# Patient Record
Sex: Female | Born: 1935 | Race: White | Hispanic: No | Smoking: Never smoker
Health system: Southern US, Community
[De-identification: ages and names within clinical notes are randomized; demographics above are authoritative.]

## PROBLEM LIST (undated history)

## (undated) DIAGNOSIS — E119 Type 2 diabetes mellitus without complications: Secondary | ICD-10-CM

## (undated) DIAGNOSIS — I2699 Other pulmonary embolism without acute cor pulmonale: Secondary | ICD-10-CM

## (undated) DIAGNOSIS — I96 Gangrene, not elsewhere classified: Secondary | ICD-10-CM

## (undated) DIAGNOSIS — I2510 Atherosclerotic heart disease of native coronary artery without angina pectoris: Secondary | ICD-10-CM

## (undated) DIAGNOSIS — R0902 Hypoxemia: Secondary | ICD-10-CM

## (undated) DIAGNOSIS — K219 Gastro-esophageal reflux disease without esophagitis: Secondary | ICD-10-CM

## (undated) DIAGNOSIS — F0390 Unspecified dementia without behavioral disturbance: Secondary | ICD-10-CM

## (undated) DIAGNOSIS — I219 Acute myocardial infarction, unspecified: Secondary | ICD-10-CM

## (undated) DIAGNOSIS — C679 Malignant neoplasm of bladder, unspecified: Secondary | ICD-10-CM

## (undated) DIAGNOSIS — L8990 Pressure ulcer of unspecified site, unspecified stage: Secondary | ICD-10-CM

## (undated) DIAGNOSIS — D649 Anemia, unspecified: Secondary | ICD-10-CM

## (undated) DIAGNOSIS — M1990 Unspecified osteoarthritis, unspecified site: Secondary | ICD-10-CM

## (undated) DIAGNOSIS — I739 Peripheral vascular disease, unspecified: Secondary | ICD-10-CM

## (undated) DIAGNOSIS — C55 Malignant neoplasm of uterus, part unspecified: Secondary | ICD-10-CM

## (undated) DIAGNOSIS — D801 Nonfamilial hypogammaglobulinemia: Secondary | ICD-10-CM

## (undated) DIAGNOSIS — L89301 Pressure ulcer of unspecified buttock, stage 1: Secondary | ICD-10-CM

## (undated) DIAGNOSIS — C539 Malignant neoplasm of cervix uteri, unspecified: Secondary | ICD-10-CM

## (undated) DIAGNOSIS — R32 Unspecified urinary incontinence: Secondary | ICD-10-CM

## (undated) DIAGNOSIS — I10 Essential (primary) hypertension: Secondary | ICD-10-CM

## (undated) DIAGNOSIS — IMO0001 Reserved for inherently not codable concepts without codable children: Secondary | ICD-10-CM

## (undated) DIAGNOSIS — C801 Malignant (primary) neoplasm, unspecified: Secondary | ICD-10-CM

## (undated) DIAGNOSIS — E7800 Pure hypercholesterolemia, unspecified: Secondary | ICD-10-CM

## (undated) DIAGNOSIS — A0472 Enterocolitis due to Clostridium difficile, not specified as recurrent: Secondary | ICD-10-CM

## (undated) DIAGNOSIS — N189 Chronic kidney disease, unspecified: Secondary | ICD-10-CM

## (undated) DIAGNOSIS — I509 Heart failure, unspecified: Secondary | ICD-10-CM

## (undated) HISTORY — DX: Nonfamilial hypogammaglobulinemia: D80.1

## (undated) HISTORY — PX: ANGIOPLASTY: SHX39

## (undated) HISTORY — PX: FEMORAL ENDARTERECTOMY: SUR606

## (undated) HISTORY — DX: Malignant (primary) neoplasm, unspecified: C80.1

## (undated) HISTORY — DX: Essential (primary) hypertension: I10

## (undated) HISTORY — DX: Gastro-esophageal reflux disease without esophagitis: K21.9

## (undated) HISTORY — PX: BLADDER SURGERY: SHX569

## (undated) HISTORY — PX: PORTACATH PLACEMENT: SHX2246

## (undated) HISTORY — PX: CATARACT EXTRACTION, BILATERAL: SHX1313

## (undated) HISTORY — DX: Unspecified osteoarthritis, unspecified site: M19.90

## (undated) HISTORY — DX: Pressure ulcer of unspecified site, unspecified stage: L89.90

## (undated) HISTORY — DX: Malignant neoplasm of cervix uteri, unspecified: C53.9

## (undated) HISTORY — DX: Malignant neoplasm of bladder, unspecified: C67.9

## (undated) HISTORY — DX: Malignant neoplasm of uterus, part unspecified: C55

## (undated) HISTORY — DX: Pressure ulcer of unspecified buttock, stage 1: L89.301

## (undated) HISTORY — DX: Enterocolitis due to Clostridium difficile, not specified as recurrent: A04.72

## (undated) HISTORY — DX: Other pulmonary embolism without acute cor pulmonale: I26.99

## (undated) HISTORY — PX: LEG SURGERY: SHX1003

## (undated) HISTORY — DX: Unspecified urinary incontinence: R32

## (undated) HISTORY — DX: Pure hypercholesterolemia, unspecified: E78.00

---

## 2004-04-02 ENCOUNTER — Other Ambulatory Visit: Payer: Self-pay

## 2004-04-09 ENCOUNTER — Ambulatory Visit: Payer: Self-pay | Admitting: Specialist

## 2004-05-20 ENCOUNTER — Ambulatory Visit: Payer: Self-pay | Admitting: Specialist

## 2004-06-10 ENCOUNTER — Ambulatory Visit: Payer: Self-pay | Admitting: Family Medicine

## 2004-09-11 ENCOUNTER — Ambulatory Visit: Payer: Self-pay | Admitting: Family Medicine

## 2005-03-28 ENCOUNTER — Emergency Department: Payer: Self-pay | Admitting: Emergency Medicine

## 2005-03-28 ENCOUNTER — Other Ambulatory Visit: Payer: Self-pay

## 2005-10-15 ENCOUNTER — Ambulatory Visit: Payer: Self-pay | Admitting: Family Medicine

## 2006-07-31 ENCOUNTER — Other Ambulatory Visit: Payer: Self-pay

## 2006-07-31 ENCOUNTER — Emergency Department: Payer: Self-pay | Admitting: Emergency Medicine

## 2006-09-01 ENCOUNTER — Observation Stay: Payer: Self-pay | Admitting: General Surgery

## 2006-11-30 ENCOUNTER — Emergency Department: Payer: Self-pay | Admitting: Unknown Physician Specialty

## 2006-12-06 ENCOUNTER — Ambulatory Visit: Payer: Self-pay | Admitting: Family Medicine

## 2006-12-23 ENCOUNTER — Ambulatory Visit: Payer: Self-pay | Admitting: Family Medicine

## 2007-01-10 ENCOUNTER — Ambulatory Visit: Payer: Self-pay | Admitting: Family Medicine

## 2007-02-12 ENCOUNTER — Other Ambulatory Visit: Payer: Self-pay

## 2007-02-13 ENCOUNTER — Inpatient Hospital Stay: Payer: Self-pay | Admitting: Internal Medicine

## 2007-07-18 ENCOUNTER — Ambulatory Visit: Payer: Self-pay | Admitting: Gynecologic Oncology

## 2007-07-20 ENCOUNTER — Ambulatory Visit: Payer: Self-pay | Admitting: Family Medicine

## 2007-08-16 ENCOUNTER — Ambulatory Visit: Payer: Self-pay | Admitting: Gynecologic Oncology

## 2007-09-04 ENCOUNTER — Emergency Department: Payer: Self-pay | Admitting: Emergency Medicine

## 2007-11-23 ENCOUNTER — Emergency Department: Payer: Self-pay | Admitting: Unknown Physician Specialty

## 2007-12-28 ENCOUNTER — Ambulatory Visit: Payer: Self-pay | Admitting: Family Medicine

## 2008-02-16 ENCOUNTER — Ambulatory Visit: Payer: Self-pay | Admitting: Gynecologic Oncology

## 2008-02-25 ENCOUNTER — Inpatient Hospital Stay: Payer: Self-pay | Admitting: Internal Medicine

## 2008-02-25 ENCOUNTER — Other Ambulatory Visit: Payer: Self-pay

## 2008-03-18 ENCOUNTER — Emergency Department: Payer: Self-pay | Admitting: Emergency Medicine

## 2008-05-07 ENCOUNTER — Ambulatory Visit: Payer: Self-pay | Admitting: Gastroenterology

## 2008-06-19 ENCOUNTER — Ambulatory Visit: Payer: Self-pay | Admitting: Gynecologic Oncology

## 2008-12-06 ENCOUNTER — Ambulatory Visit: Payer: Self-pay | Admitting: Gynecologic Oncology

## 2008-12-17 ENCOUNTER — Ambulatory Visit: Payer: Self-pay | Admitting: Gynecologic Oncology

## 2008-12-24 ENCOUNTER — Ambulatory Visit: Payer: Self-pay | Admitting: Gynecologic Oncology

## 2009-01-06 ENCOUNTER — Ambulatory Visit: Payer: Self-pay | Admitting: Gynecologic Oncology

## 2009-02-05 ENCOUNTER — Ambulatory Visit: Payer: Self-pay | Admitting: Family Medicine

## 2009-03-20 ENCOUNTER — Ambulatory Visit: Payer: Self-pay | Admitting: Family Medicine

## 2009-03-25 ENCOUNTER — Ambulatory Visit: Payer: Self-pay | Admitting: Family Medicine

## 2009-04-10 ENCOUNTER — Ambulatory Visit: Payer: Self-pay | Admitting: Family Medicine

## 2009-10-07 ENCOUNTER — Ambulatory Visit: Payer: Self-pay | Admitting: Family Medicine

## 2009-12-18 ENCOUNTER — Emergency Department: Payer: Self-pay | Admitting: Emergency Medicine

## 2010-02-06 ENCOUNTER — Ambulatory Visit: Payer: Self-pay | Admitting: Gynecologic Oncology

## 2010-02-18 ENCOUNTER — Ambulatory Visit: Payer: Self-pay | Admitting: Gynecologic Oncology

## 2010-03-07 LAB — PATHOLOGY REPORT

## 2010-03-08 ENCOUNTER — Ambulatory Visit: Payer: Self-pay | Admitting: Gynecologic Oncology

## 2010-03-13 ENCOUNTER — Ambulatory Visit: Payer: Self-pay | Admitting: Gynecologic Oncology

## 2010-04-08 ENCOUNTER — Ambulatory Visit: Payer: Self-pay | Admitting: Gynecologic Oncology

## 2010-04-08 ENCOUNTER — Ambulatory Visit: Payer: Self-pay | Admitting: Family Medicine

## 2010-04-15 ENCOUNTER — Ambulatory Visit: Payer: Self-pay | Admitting: Gynecologic Oncology

## 2010-05-08 ENCOUNTER — Ambulatory Visit: Payer: Self-pay | Admitting: Gynecologic Oncology

## 2010-06-12 ENCOUNTER — Emergency Department: Payer: Self-pay | Admitting: Emergency Medicine

## 2010-06-24 ENCOUNTER — Ambulatory Visit: Payer: Self-pay | Admitting: Gynecologic Oncology

## 2010-07-09 ENCOUNTER — Ambulatory Visit: Payer: Self-pay | Admitting: Gynecologic Oncology

## 2010-07-15 ENCOUNTER — Ambulatory Visit: Payer: Self-pay | Admitting: Family Medicine

## 2010-07-21 ENCOUNTER — Ambulatory Visit: Payer: Self-pay | Admitting: Gynecologic Oncology

## 2010-07-29 ENCOUNTER — Inpatient Hospital Stay: Payer: Self-pay | Admitting: Urology

## 2010-07-29 HISTORY — PX: TOTAL ABDOMINAL HYSTERECTOMY W/ BILATERAL SALPINGOOPHORECTOMY: SHX83

## 2010-08-01 LAB — PATHOLOGY REPORT

## 2010-08-07 ENCOUNTER — Emergency Department: Payer: Self-pay | Admitting: Emergency Medicine

## 2010-08-07 ENCOUNTER — Ambulatory Visit: Payer: Self-pay | Admitting: Gynecologic Oncology

## 2010-08-19 ENCOUNTER — Ambulatory Visit: Payer: Self-pay | Admitting: Gynecologic Oncology

## 2010-08-21 ENCOUNTER — Inpatient Hospital Stay: Payer: Self-pay | Admitting: Internal Medicine

## 2010-09-07 ENCOUNTER — Ambulatory Visit: Payer: Self-pay | Admitting: Gynecologic Oncology

## 2010-09-07 ENCOUNTER — Inpatient Hospital Stay: Payer: Self-pay | Admitting: Specialist

## 2010-09-18 ENCOUNTER — Ambulatory Visit: Payer: Self-pay | Admitting: Oncology

## 2010-10-07 ENCOUNTER — Ambulatory Visit: Payer: Self-pay | Admitting: Oncology

## 2010-10-07 ENCOUNTER — Ambulatory Visit: Payer: Self-pay | Admitting: Gynecologic Oncology

## 2010-10-12 ENCOUNTER — Inpatient Hospital Stay: Payer: Self-pay | Admitting: Internal Medicine

## 2010-11-07 ENCOUNTER — Ambulatory Visit: Payer: Self-pay | Admitting: Gynecologic Oncology

## 2010-11-07 ENCOUNTER — Ambulatory Visit: Payer: Self-pay | Admitting: Oncology

## 2010-11-11 ENCOUNTER — Ambulatory Visit: Payer: Self-pay | Admitting: Gynecologic Oncology

## 2010-11-13 ENCOUNTER — Inpatient Hospital Stay: Payer: Self-pay | Admitting: Internal Medicine

## 2010-12-07 ENCOUNTER — Ambulatory Visit: Payer: Self-pay | Admitting: Gynecologic Oncology

## 2010-12-08 ENCOUNTER — Inpatient Hospital Stay: Payer: Self-pay | Admitting: Internal Medicine

## 2010-12-10 ENCOUNTER — Ambulatory Visit: Payer: Self-pay | Admitting: Oncology

## 2010-12-24 ENCOUNTER — Inpatient Hospital Stay: Payer: Self-pay

## 2010-12-27 ENCOUNTER — Inpatient Hospital Stay: Payer: Self-pay | Admitting: Internal Medicine

## 2011-01-06 LAB — CA 125: CA 125: 109.7 U/mL — ABNORMAL HIGH (ref 0.0–34.0)

## 2011-01-07 ENCOUNTER — Ambulatory Visit: Payer: Self-pay | Admitting: Gynecologic Oncology

## 2011-01-07 ENCOUNTER — Ambulatory Visit: Payer: Self-pay | Admitting: Oncology

## 2011-01-22 LAB — CA 125: CA 125: 155.5 U/mL — ABNORMAL HIGH (ref 0.0–34.0)

## 2011-01-27 LAB — CA 125: CA 125: 166.9 U/mL — ABNORMAL HIGH (ref 0.0–34.0)

## 2011-02-01 ENCOUNTER — Emergency Department: Payer: Self-pay | Admitting: Emergency Medicine

## 2011-02-07 ENCOUNTER — Ambulatory Visit: Payer: Self-pay | Admitting: Oncology

## 2011-02-07 ENCOUNTER — Ambulatory Visit: Payer: Self-pay | Admitting: Gynecologic Oncology

## 2011-03-09 ENCOUNTER — Ambulatory Visit: Payer: Self-pay | Admitting: Oncology

## 2011-03-09 ENCOUNTER — Ambulatory Visit: Payer: Self-pay | Admitting: Gynecologic Oncology

## 2011-04-09 ENCOUNTER — Ambulatory Visit: Payer: Self-pay | Admitting: Gynecologic Oncology

## 2011-04-09 ENCOUNTER — Ambulatory Visit: Payer: Self-pay | Admitting: Oncology

## 2011-04-16 LAB — CA 125: CA 125: 117.5 U/mL — ABNORMAL HIGH (ref 0.0–34.0)

## 2011-05-06 ENCOUNTER — Inpatient Hospital Stay: Payer: Self-pay | Admitting: Internal Medicine

## 2011-05-09 ENCOUNTER — Ambulatory Visit: Payer: Self-pay | Admitting: Gynecologic Oncology

## 2011-05-09 ENCOUNTER — Ambulatory Visit: Payer: Self-pay | Admitting: Oncology

## 2011-05-14 LAB — CA 125: CA 125: 131.5 U/mL — ABNORMAL HIGH (ref 0.0–34.0)

## 2011-06-03 LAB — CA 125: CA 125: 128.2 U/mL — ABNORMAL HIGH (ref 0.0–34.0)

## 2011-06-09 ENCOUNTER — Ambulatory Visit: Payer: Self-pay | Admitting: Oncology

## 2011-06-09 ENCOUNTER — Ambulatory Visit: Payer: Self-pay | Admitting: Gynecologic Oncology

## 2011-06-11 LAB — CBC CANCER CENTER
Basophil #: 0.1 x10 3/mm (ref 0.0–0.1)
HCT: 29.2 % — ABNORMAL LOW (ref 35.0–47.0)
HGB: 9.9 g/dL — ABNORMAL LOW (ref 12.0–16.0)
Lymphocyte %: 32 %
MCH: 30.3 pg (ref 26.0–34.0)
Monocyte %: 8.7 %
Neutrophil #: 4.5 x10 3/mm (ref 1.4–6.5)
Neutrophil %: 56.6 %
Platelet: 273 x10 3/mm (ref 150–440)
RDW: 20.6 % — ABNORMAL HIGH (ref 11.5–14.5)
WBC: 7.9 x10 3/mm (ref 3.6–11.0)

## 2011-06-11 LAB — MAGNESIUM: Magnesium: 1.5 mg/dL — ABNORMAL LOW

## 2011-06-15 LAB — CBC CANCER CENTER
Basophil #: 0 x10 3/mm (ref 0.0–0.1)
Basophil %: 0.1 %
Eosinophil #: 0.1 x10 3/mm (ref 0.0–0.7)
Eosinophil %: 1.6 %
HCT: 29 % — ABNORMAL LOW (ref 35.0–47.0)
Lymphocyte #: 2.3 x10 3/mm (ref 1.0–3.6)
MCHC: 32.9 g/dL (ref 32.0–36.0)
Monocyte #: 0.7 x10 3/mm (ref 0.0–0.7)
Neutrophil %: 57.8 %
RBC: 3.17 10*6/uL — ABNORMAL LOW (ref 3.80–5.20)
RDW: 20.5 % — ABNORMAL HIGH (ref 11.5–14.5)
WBC: 7.7 x10 3/mm (ref 3.6–11.0)

## 2011-06-15 LAB — PROTIME-INR: INR: 2.7

## 2011-06-15 LAB — MAGNESIUM: Magnesium: 1.5 mg/dL — ABNORMAL LOW

## 2011-06-18 LAB — CBC CANCER CENTER
Basophil #: 0 x10 3/mm (ref 0.0–0.1)
Basophil %: 0.4 %
Eosinophil %: 1.4 %
HCT: 29.5 % — ABNORMAL LOW (ref 35.0–47.0)
Lymphocyte %: 31.9 %
MCH: 30.1 pg (ref 26.0–34.0)
Neutrophil #: 4 x10 3/mm (ref 1.4–6.5)
Neutrophil %: 56 %
Platelet: 362 x10 3/mm (ref 150–440)
RBC: 3.24 10*6/uL — ABNORMAL LOW (ref 3.80–5.20)
WBC: 7.1 x10 3/mm (ref 3.6–11.0)

## 2011-06-18 LAB — POTASSIUM: Potassium: 4.1 mmol/L (ref 3.5–5.1)

## 2011-06-18 LAB — CREATININE, SERUM: EGFR (Non-African Amer.): 43 — ABNORMAL LOW

## 2011-06-18 LAB — PROTIME-INR
INR: 2
Prothrombin Time: 23.2 secs — ABNORMAL HIGH (ref 11.5–14.7)

## 2011-06-18 LAB — MAGNESIUM: Magnesium: 1.4 mg/dL — ABNORMAL LOW

## 2011-06-20 LAB — WOUND CULTURE

## 2011-06-22 LAB — CBC CANCER CENTER
Basophil #: 0 x10 3/mm (ref 0.0–0.1)
Basophil %: 0.2 %
Eosinophil #: 0 x10 3/mm (ref 0.0–0.7)
HGB: 10.3 g/dL — ABNORMAL LOW (ref 12.0–16.0)
Lymphocyte %: 14.9 %
MCH: 30.3 pg (ref 26.0–34.0)
MCHC: 32.9 g/dL (ref 32.0–36.0)
MCV: 92 fL (ref 80–100)
Monocyte #: 0.1 x10 3/mm (ref 0.0–0.7)
Monocyte %: 1.1 %
Neutrophil %: 83.8 %
Platelet: 380 x10 3/mm (ref 150–440)
RBC: 3.39 10*6/uL — ABNORMAL LOW (ref 3.80–5.20)

## 2011-06-22 LAB — PROTIME-INR: INR: 1.9

## 2011-06-22 LAB — COMPREHENSIVE METABOLIC PANEL
Albumin: 2.7 g/dL — ABNORMAL LOW (ref 3.4–5.0)
Alkaline Phosphatase: 78 U/L (ref 50–136)
Anion Gap: 15 (ref 7–16)
BUN: 30 mg/dL — ABNORMAL HIGH (ref 7–18)
Bilirubin,Total: 0.1 mg/dL — ABNORMAL LOW (ref 0.2–1.0)
Calcium, Total: 8.9 mg/dL (ref 8.5–10.1)
Chloride: 99 mmol/L (ref 98–107)
Co2: 21 mmol/L (ref 21–32)
Creatinine: 1.75 mg/dL — ABNORMAL HIGH (ref 0.60–1.30)
EGFR (African American): 37 — ABNORMAL LOW
EGFR (Non-African Amer.): 30 — ABNORMAL LOW
Glucose: 480 mg/dL — ABNORMAL HIGH (ref 65–99)
Osmolality: 297 (ref 275–301)
Potassium: 4.6 mmol/L (ref 3.5–5.1)
SGOT(AST): 13 U/L — ABNORMAL LOW (ref 15–37)
SGPT (ALT): 12 U/L
Sodium: 135 mmol/L — ABNORMAL LOW (ref 136–145)
Total Protein: 6.9 g/dL (ref 6.4–8.2)

## 2011-06-22 LAB — MAGNESIUM: Magnesium: 1.6 mg/dL — ABNORMAL LOW

## 2011-06-23 LAB — CREATININE, SERUM
Creatinine: 1.29 mg/dL (ref 0.60–1.30)
EGFR (African American): 52 — ABNORMAL LOW
EGFR (Non-African Amer.): 43 — ABNORMAL LOW

## 2011-06-23 LAB — MAGNESIUM: Magnesium: 1.8 mg/dL

## 2011-06-25 LAB — CBC CANCER CENTER
Basophil %: 0.2 %
Eosinophil #: 0 x10 3/mm (ref 0.0–0.7)
Eosinophil %: 0 %
HGB: 10.7 g/dL — ABNORMAL LOW (ref 12.0–16.0)
Lymphocyte #: 1.7 x10 3/mm (ref 1.0–3.6)
MCH: 30.2 pg (ref 26.0–34.0)
MCHC: 33.1 g/dL (ref 32.0–36.0)
MCV: 91 fL (ref 80–100)
Monocyte #: 0.1 x10 3/mm (ref 0.0–0.7)
Neutrophil %: 82.9 %
Platelet: 381 x10 3/mm (ref 150–440)
RBC: 3.55 10*6/uL — ABNORMAL LOW (ref 3.80–5.20)
WBC: 10.3 x10 3/mm (ref 3.6–11.0)

## 2011-06-25 LAB — CREATININE, SERUM
Creatinine: 1.63 mg/dL — ABNORMAL HIGH (ref 0.60–1.30)
EGFR (African American): 40 — ABNORMAL LOW
EGFR (Non-African Amer.): 33 — ABNORMAL LOW

## 2011-06-25 LAB — POTASSIUM: Potassium: 4.5 mmol/L (ref 3.5–5.1)

## 2011-06-29 LAB — CBC CANCER CENTER
Basophil #: 0 x10 3/mm (ref 0.0–0.1)
HCT: 32.6 % — ABNORMAL LOW (ref 35.0–47.0)
HGB: 10.9 g/dL — ABNORMAL LOW (ref 12.0–16.0)
Lymphocyte %: 12.3 %
MCH: 30.7 pg (ref 26.0–34.0)
Monocyte #: 0.2 x10 3/mm (ref 0.0–0.7)
Monocyte %: 1.1 %
Neutrophil #: 13.4 x10 3/mm — ABNORMAL HIGH (ref 1.4–6.5)
Neutrophil %: 86.5 %
Platelet: 344 x10 3/mm (ref 150–440)
RDW: 19.6 % — ABNORMAL HIGH (ref 11.5–14.5)
WBC: 15.5 x10 3/mm — ABNORMAL HIGH (ref 3.6–11.0)

## 2011-06-29 LAB — CREATININE, SERUM
Creatinine: 1.47 mg/dL — ABNORMAL HIGH (ref 0.60–1.30)
Creatinine: 1.33 mg/dL — ABNORMAL HIGH (ref 0.60–1.30)
EGFR (African American): 45 — ABNORMAL LOW
EGFR (African American): 50 — ABNORMAL LOW

## 2011-06-29 LAB — PROTIME-INR: INR: 2.4

## 2011-07-01 LAB — CREATININE, SERUM
Creatinine: 1.32 mg/dL — ABNORMAL HIGH (ref 0.60–1.30)
EGFR (African American): 51 — ABNORMAL LOW
EGFR (Non-African Amer.): 42 — ABNORMAL LOW

## 2011-07-01 LAB — POTASSIUM: Potassium: 4.2 mmol/L (ref 3.5–5.1)

## 2011-07-01 LAB — PROTIME-INR
INR: 4.3
Prothrombin Time: 41.2 secs — ABNORMAL HIGH (ref 11.5–14.7)

## 2011-07-02 LAB — CBC CANCER CENTER
Basophil #: 0 x10 3/mm (ref 0.0–0.1)
Basophil %: 0.5 %
Eosinophil #: 0 x10 3/mm (ref 0.0–0.7)
HCT: 31.6 % — ABNORMAL LOW (ref 35.0–47.0)
HGB: 10.6 g/dL — ABNORMAL LOW (ref 12.0–16.0)
Lymphocyte #: 2.1 x10 3/mm (ref 1.0–3.6)
Lymphocyte %: 25.9 %
MCH: 30.6 pg (ref 26.0–34.0)
MCHC: 33.5 g/dL (ref 32.0–36.0)
Monocyte #: 0.1 x10 3/mm (ref 0.0–0.7)
Neutrophil %: 71.8 %
Platelet: 178 x10 3/mm (ref 150–440)
RDW: 19.5 % — ABNORMAL HIGH (ref 11.5–14.5)

## 2011-07-02 LAB — HEPATIC FUNCTION PANEL A (ARMC)
Albumin: 2.4 g/dL — ABNORMAL LOW (ref 3.4–5.0)
Alkaline Phosphatase: 58 U/L (ref 50–136)
Bilirubin,Total: 0.2 mg/dL (ref 0.2–1.0)
SGOT(AST): 20 U/L (ref 15–37)
Total Protein: 5.9 g/dL — ABNORMAL LOW (ref 6.4–8.2)

## 2011-07-02 LAB — URINALYSIS, COMPLETE
RBC,UR: 4759 /HPF (ref 0–5)
Squamous Epithelial: 8

## 2011-07-02 LAB — MAGNESIUM: Magnesium: 1.7 mg/dL — ABNORMAL LOW

## 2011-07-02 LAB — CREATININE, SERUM
Creatinine: 1.24 mg/dL (ref 0.60–1.30)
EGFR (African American): 54 — ABNORMAL LOW
EGFR (Non-African Amer.): 45 — ABNORMAL LOW

## 2011-07-02 LAB — LACTATE DEHYDROGENASE: LDH: 169 U/L (ref 84–246)

## 2011-07-02 LAB — POTASSIUM: Potassium: 4.1 mmol/L (ref 3.5–5.1)

## 2011-07-02 LAB — PROTIME-INR
INR: 3
Prothrombin Time: 31.5 secs — ABNORMAL HIGH (ref 11.5–14.7)

## 2011-07-06 LAB — CBC CANCER CENTER
Basophil #: 0 x10 3/mm (ref 0.0–0.1)
Eosinophil #: 0.1 x10 3/mm (ref 0.0–0.7)
HCT: 27.8 % — ABNORMAL LOW (ref 35.0–47.0)
HGB: 9.3 g/dL — ABNORMAL LOW (ref 12.0–16.0)
Lymphocyte #: 1.7 x10 3/mm (ref 1.0–3.6)
MCH: 30.7 pg (ref 26.0–34.0)
MCHC: 33.5 g/dL (ref 32.0–36.0)
MCV: 92 fL (ref 80–100)
Monocyte #: 0.1 x10 3/mm (ref 0.0–0.7)
Neutrophil %: 59.3 %
Platelet: 204 x10 3/mm (ref 150–440)
RBC: 3.04 10*6/uL — ABNORMAL LOW (ref 3.80–5.20)

## 2011-07-06 LAB — CREATININE, SERUM: Creatinine: 1.24 mg/dL (ref 0.60–1.30)

## 2011-07-06 LAB — POTASSIUM: Potassium: 4.3 mmol/L (ref 3.5–5.1)

## 2011-07-06 LAB — MAGNESIUM: Magnesium: 1.4 mg/dL — ABNORMAL LOW

## 2011-07-08 LAB — CBC CANCER CENTER
Eosinophil #: 0 x10 3/mm (ref 0.0–0.7)
Eosinophil %: 0.9 %
HGB: 9.2 g/dL — ABNORMAL LOW (ref 12.0–16.0)
Lymphocyte %: 45.6 %
MCHC: 33.7 g/dL (ref 32.0–36.0)
MCV: 91 fL (ref 80–100)
Monocyte %: 5.8 %
Neutrophil #: 2 x10 3/mm (ref 1.4–6.5)
Neutrophil %: 47 %
RBC: 3 10*6/uL — ABNORMAL LOW (ref 3.80–5.20)
RDW: 18.6 % — ABNORMAL HIGH (ref 11.5–14.5)
WBC: 4.2 x10 3/mm (ref 3.6–11.0)

## 2011-07-08 LAB — CREATININE, SERUM
Creatinine: 1.11 mg/dL (ref 0.60–1.30)
EGFR (African American): >60

## 2011-07-08 LAB — PROTIME-INR: INR: 1.3

## 2011-07-08 LAB — MAGNESIUM: Magnesium: 1.5 mg/dL — ABNORMAL LOW

## 2011-07-10 ENCOUNTER — Ambulatory Visit: Payer: Self-pay | Admitting: Internal Medicine

## 2011-07-10 ENCOUNTER — Ambulatory Visit: Payer: Self-pay | Admitting: Gynecologic Oncology

## 2011-07-10 LAB — DIFFERENTIAL
Basophil %: 0.4 %
Eosinophil #: 0.1 10*3/uL (ref 0.0–0.7)
Eosinophil %: 2.3 %
Lymphocyte #: 1.5 10*3/uL (ref 1.0–3.6)
Monocyte #: 0.5 10*3/uL (ref 0.0–0.7)
Monocyte %: 16.7 %
Neutrophil #: 0.8 10*3/uL — ABNORMAL LOW (ref 1.4–6.5)

## 2011-07-10 LAB — CANCER CENTER WBC: WBC: 2.9 x10 3/mm — ABNORMAL LOW (ref 3.6–11.0)

## 2011-07-13 LAB — CBC CANCER CENTER
Lymphocyte #: 1.6 x10 3/mm (ref 1.0–3.6)
Lymphocyte %: 42.3 %
Monocyte %: 18.7 %
Neutrophil %: 35.8 %
Platelet: 260 x10 3/mm (ref 150–440)
RDW: 18.7 % — ABNORMAL HIGH (ref 11.5–14.5)
WBC: 3.9 x10 3/mm (ref 3.6–11.0)

## 2011-07-13 LAB — PROTIME-INR
INR: 1.7
Prothrombin Time: 20 secs — ABNORMAL HIGH (ref 11.5–14.7)

## 2011-07-13 LAB — BASIC METABOLIC PANEL
BUN: 24 mg/dL — ABNORMAL HIGH (ref 7–18)
Calcium, Total: 8.4 mg/dL — ABNORMAL LOW (ref 8.5–10.1)
Co2: 25 mmol/L (ref 21–32)
Creatinine: 1.38 mg/dL — ABNORMAL HIGH (ref 0.60–1.30)
EGFR (African American): 48 — ABNORMAL LOW
EGFR (Non-African Amer.): 40 — ABNORMAL LOW
Glucose: 248 mg/dL — ABNORMAL HIGH (ref 65–99)
Sodium: 139 mmol/L (ref 136–145)

## 2011-07-13 LAB — MAGNESIUM: Magnesium: 1.6 mg/dL — ABNORMAL LOW

## 2011-07-17 LAB — PROTIME-INR
INR: 1.7
Prothrombin Time: 20 secs — ABNORMAL HIGH (ref 11.5–14.7)

## 2011-07-17 LAB — CBC CANCER CENTER
Basophil %: 0.4 %
Eosinophil #: 0.1 x10 3/mm (ref 0.0–0.7)
Eosinophil %: 1 %
HGB: 9.4 g/dL — ABNORMAL LOW (ref 12.0–16.0)
Lymphocyte #: 2.2 x10 3/mm (ref 1.0–3.6)
MCH: 29.4 pg (ref 26.0–34.0)
MCHC: 32.5 g/dL (ref 32.0–36.0)
MCV: 91 fL (ref 80–100)
Monocyte #: 0.8 x10 3/mm — ABNORMAL HIGH (ref 0.0–0.7)
Neutrophil #: 3.6 x10 3/mm (ref 1.4–6.5)
Neutrophil %: 53.4 %
RBC: 3.22 10*6/uL — ABNORMAL LOW (ref 3.80–5.20)
WBC: 6.8 x10 3/mm (ref 3.6–11.0)

## 2011-07-18 ENCOUNTER — Inpatient Hospital Stay: Payer: Self-pay | Admitting: Internal Medicine

## 2011-07-18 LAB — URINALYSIS, COMPLETE
Glucose,UR: 50 mg/dL (ref 0–75)
Ketone: NEGATIVE
Ph: 6 (ref 4.5–8.0)
Protein: 100
RBC,UR: 67 /HPF (ref 0–5)
Specific Gravity: 1.014 (ref 1.003–1.030)
Squamous Epithelial: 1
WBC UR: 29 /HPF (ref 0–5)

## 2011-07-18 LAB — CBC
HCT: 29.5 % — ABNORMAL LOW (ref 35.0–47.0)
HGB: 9.7 g/dL — ABNORMAL LOW (ref 12.0–16.0)
MCH: 30.1 pg (ref 26.0–34.0)
MCV: 91 fL (ref 80–100)
Platelet: 244 10*3/uL (ref 150–440)
RBC: 3.24 10*6/uL — ABNORMAL LOW (ref 3.80–5.20)
RDW: 18 % — ABNORMAL HIGH (ref 11.5–14.5)
WBC: 13.3 10*3/uL — ABNORMAL HIGH (ref 3.6–11.0)

## 2011-07-18 LAB — COMPREHENSIVE METABOLIC PANEL
Albumin: 2.5 g/dL — ABNORMAL LOW (ref 3.4–5.0)
Alkaline Phosphatase: 53 U/L (ref 50–136)
BUN: 20 mg/dL — ABNORMAL HIGH (ref 7–18)
Calcium, Total: 8.7 mg/dL (ref 8.5–10.1)
Co2: 24 mmol/L (ref 21–32)
Creatinine: 1.21 mg/dL (ref 0.60–1.30)
EGFR (Non-African Amer.): 46 — ABNORMAL LOW
Glucose: 248 mg/dL — ABNORMAL HIGH (ref 65–99)
Osmolality: 281 (ref 275–301)
SGPT (ALT): 9 U/L — ABNORMAL LOW
Sodium: 135 mmol/L — ABNORMAL LOW (ref 136–145)
Total Protein: 6.8 g/dL (ref 6.4–8.2)

## 2011-07-18 LAB — MAGNESIUM: Magnesium: 1.9 mg/dL

## 2011-07-19 LAB — BASIC METABOLIC PANEL
BUN: 23 mg/dL — ABNORMAL HIGH (ref 7–18)
Calcium, Total: 8.5 mg/dL (ref 8.5–10.1)
Chloride: 103 mmol/L (ref 98–107)
EGFR (African American): 58 — ABNORMAL LOW
EGFR (Non-African Amer.): 48 — ABNORMAL LOW
Osmolality: 292 (ref 275–301)
Potassium: 4.9 mmol/L (ref 3.5–5.1)
Sodium: 136 mmol/L (ref 136–145)

## 2011-07-19 LAB — CBC WITH DIFFERENTIAL/PLATELET
Basophil %: 0 %
Eosinophil #: 0 10*3/uL (ref 0.0–0.7)
Eosinophil %: 0 %
HGB: 9.6 g/dL — ABNORMAL LOW (ref 12.0–16.0)
Lymphocyte %: 8.7 %
MCHC: 33 g/dL (ref 32.0–36.0)
Monocyte #: 0.2 10*3/uL (ref 0.0–0.7)
Monocyte %: 1.5 %
Neutrophil %: 89.8 %
RBC: 3.17 10*6/uL — ABNORMAL LOW (ref 3.80–5.20)
WBC: 11.8 10*3/uL — ABNORMAL HIGH (ref 3.6–11.0)

## 2011-07-19 LAB — PROTIME-INR: Prothrombin Time: 23.8 secs — ABNORMAL HIGH (ref 11.5–14.7)

## 2011-07-19 LAB — MAGNESIUM: Magnesium: 1.8 mg/dL

## 2011-07-20 LAB — MAGNESIUM: Magnesium: 1.9 mg/dL

## 2011-07-20 LAB — CBC WITH DIFFERENTIAL/PLATELET
Basophil %: 0.3 %
Eosinophil %: 0.1 %
HGB: 9.1 g/dL — ABNORMAL LOW (ref 12.0–16.0)
Lymphocyte %: 21 %
MCH: 30.2 pg (ref 26.0–34.0)
Monocyte #: 0.6 10*3/uL (ref 0.0–0.7)
Monocyte %: 6.9 %
Neutrophil %: 71.7 %
Platelet: 315 10*3/uL (ref 150–440)
RBC: 3.01 10*6/uL — ABNORMAL LOW (ref 3.80–5.20)
RDW: 17.8 % — ABNORMAL HIGH (ref 11.5–14.5)
WBC: 8.8 10*3/uL (ref 3.6–11.0)

## 2011-07-20 LAB — BASIC METABOLIC PANEL
Anion Gap: 7 (ref 7–16)
Calcium, Total: 8.9 mg/dL (ref 8.5–10.1)
Chloride: 105 mmol/L (ref 98–107)
Co2: 26 mmol/L (ref 21–32)
Creatinine: 1.18 mg/dL (ref 0.60–1.30)
Osmolality: 289 (ref 275–301)
Potassium: 4 mmol/L (ref 3.5–5.1)

## 2011-07-20 LAB — URINE CULTURE

## 2011-07-20 LAB — HEMOGLOBIN A1C: Hemoglobin A1C: 8.4 % — ABNORMAL HIGH (ref 4.2–6.3)

## 2011-07-20 LAB — PROTIME-INR: Prothrombin Time: 28.7 secs — ABNORMAL HIGH (ref 11.5–14.7)

## 2011-07-23 LAB — CREATININE, SERUM
Creatinine: 1.28 mg/dL (ref 0.60–1.30)
EGFR (African American): 53 — ABNORMAL LOW

## 2011-07-23 LAB — CBC CANCER CENTER
Basophil #: 0 x10 3/mm (ref 0.0–0.1)
Basophil %: 0.5 %
Eosinophil #: 0.1 x10 3/mm (ref 0.0–0.7)
Eosinophil %: 0.7 %
HCT: 29.8 % — ABNORMAL LOW (ref 35.0–47.0)
Lymphocyte #: 2.5 x10 3/mm (ref 1.0–3.6)
MCH: 30 pg (ref 26.0–34.0)
MCHC: 32.9 g/dL (ref 32.0–36.0)
MCV: 91 fL (ref 80–100)
Monocyte #: 0.9 x10 3/mm — ABNORMAL HIGH (ref 0.0–0.7)
Neutrophil %: 63.6 %
Platelet: 495 x10 3/mm — ABNORMAL HIGH (ref 150–440)
RBC: 3.26 10*6/uL — ABNORMAL LOW (ref 3.80–5.20)
RDW: 18.3 % — ABNORMAL HIGH (ref 11.5–14.5)

## 2011-07-23 LAB — PROTIME-INR: Prothrombin Time: 22.7 secs — ABNORMAL HIGH (ref 11.5–14.7)

## 2011-07-23 LAB — CULTURE, BLOOD (SINGLE)

## 2011-07-23 LAB — POTASSIUM: Potassium: 4.1 mmol/L (ref 3.5–5.1)

## 2011-07-27 LAB — PROTIME-INR
INR: 1.9
Prothrombin Time: 22.3 s — ABNORMAL HIGH

## 2011-07-27 LAB — MAGNESIUM: Magnesium: 1.5 mg/dL — ABNORMAL LOW

## 2011-07-27 LAB — CREATININE, SERUM
EGFR (African American): 53 — ABNORMAL LOW
EGFR (Non-African Amer.): 43 — ABNORMAL LOW

## 2011-07-30 LAB — BASIC METABOLIC PANEL
Co2: 22 mmol/L (ref 21–32)
Creatinine: 1.31 mg/dL — ABNORMAL HIGH (ref 0.60–1.30)
EGFR (African American): 51 — ABNORMAL LOW
EGFR (Non-African Amer.): 42 — ABNORMAL LOW
Potassium: 3.8 mmol/L (ref 3.5–5.1)
Sodium: 138 mmol/L (ref 136–145)

## 2011-07-30 LAB — MAGNESIUM: Magnesium: 1.6 mg/dL — ABNORMAL LOW

## 2011-08-03 LAB — POTASSIUM: Potassium: 4.4 mmol/L (ref 3.5–5.1)

## 2011-08-03 LAB — CREATININE, SERUM: Creatinine: 1.32 mg/dL — ABNORMAL HIGH (ref 0.60–1.30)

## 2011-08-04 LAB — CA 125: CA 125: 93 U/mL — ABNORMAL HIGH (ref 0.0–34.0)

## 2011-08-06 ENCOUNTER — Inpatient Hospital Stay: Payer: Self-pay | Admitting: Internal Medicine

## 2011-08-06 LAB — COMPREHENSIVE METABOLIC PANEL
Albumin: 2.6 g/dL — ABNORMAL LOW (ref 3.4–5.0)
Alkaline Phosphatase: 40 U/L — ABNORMAL LOW (ref 50–136)
Anion Gap: 14 (ref 7–16)
BUN: 20 mg/dL — ABNORMAL HIGH (ref 7–18)
Bilirubin,Total: 0.2 mg/dL (ref 0.2–1.0)
Calcium, Total: 8.6 mg/dL (ref 8.5–10.1)
Chloride: 105 mmol/L (ref 98–107)
Co2: 21 mmol/L (ref 21–32)
Creatinine: 1.18 mg/dL (ref 0.60–1.30)
EGFR (African American): 58 — ABNORMAL LOW
Glucose: 251 mg/dL — ABNORMAL HIGH (ref 65–99)
Osmolality: 290 (ref 275–301)
Potassium: 4.2 mmol/L (ref 3.5–5.1)
Sodium: 140 mmol/L (ref 136–145)

## 2011-08-06 LAB — CBC WITH DIFFERENTIAL/PLATELET
Eosinophil %: 0.8 %
HCT: 30.1 % — ABNORMAL LOW (ref 35.0–47.0)
Lymphocyte #: 2.3 10*3/uL (ref 1.0–3.6)
MCH: 30 pg (ref 26.0–34.0)
MCHC: 32.7 g/dL (ref 32.0–36.0)
MCV: 92 fL (ref 80–100)
Monocyte #: 1 10*3/uL — ABNORMAL HIGH (ref 0.0–0.7)
Monocyte %: 9.9 %
Neutrophil #: 6.4 10*3/uL (ref 1.4–6.5)
Platelet: 324 10*3/uL (ref 150–440)
RBC: 3.28 10*6/uL — ABNORMAL LOW (ref 3.80–5.20)
WBC: 9.7 10*3/uL (ref 3.6–11.0)

## 2011-08-06 LAB — URINALYSIS, COMPLETE
Nitrite: NEGATIVE
Ph: 5 (ref 4.5–8.0)
Protein: 100

## 2011-08-06 LAB — CREATININE, SERUM
EGFR (African American): 53 — ABNORMAL LOW
EGFR (Non-African Amer.): 43 — ABNORMAL LOW

## 2011-08-06 LAB — PROTIME-INR
INR: 1.7
Prothrombin Time: 19.9 secs — ABNORMAL HIGH (ref 11.5–14.7)

## 2011-08-07 ENCOUNTER — Ambulatory Visit: Payer: Self-pay | Admitting: Internal Medicine

## 2011-08-07 ENCOUNTER — Ambulatory Visit: Payer: Self-pay | Admitting: Gynecologic Oncology

## 2011-08-07 LAB — COMPREHENSIVE METABOLIC PANEL
Albumin: 2.3 g/dL — ABNORMAL LOW (ref 3.4–5.0)
Alkaline Phosphatase: 43 U/L — ABNORMAL LOW (ref 50–136)
BUN: 18 mg/dL (ref 7–18)
Bilirubin,Total: 0.2 mg/dL (ref 0.2–1.0)
Calcium, Total: 8.4 mg/dL — ABNORMAL LOW (ref 8.5–10.1)
Creatinine: 1.14 mg/dL (ref 0.60–1.30)
Glucose: 243 mg/dL — ABNORMAL HIGH (ref 65–99)
Osmolality: 287 (ref 275–301)
SGPT (ALT): 7 U/L — ABNORMAL LOW
Sodium: 139 mmol/L (ref 136–145)

## 2011-08-07 LAB — CBC WITH DIFFERENTIAL/PLATELET
Basophil #: 0 10*3/uL (ref 0.0–0.1)
Eosinophil #: 0.1 10*3/uL (ref 0.0–0.7)
Eosinophil %: 1.2 %
Lymphocyte %: 21.4 %
MCH: 30.1 pg (ref 26.0–34.0)
MCHC: 32.7 g/dL (ref 32.0–36.0)
MCV: 92 fL (ref 80–100)
Monocyte %: 8.4 %
Neutrophil #: 5.5 10*3/uL (ref 1.4–6.5)
Neutrophil %: 68.7 %
Platelet: 279 10*3/uL (ref 150–440)
RDW: 18.3 % — ABNORMAL HIGH (ref 11.5–14.5)
WBC: 8 10*3/uL (ref 3.6–11.0)

## 2011-08-07 LAB — PROTIME-INR: INR: 1.8

## 2011-08-08 LAB — BASIC METABOLIC PANEL
Anion Gap: 13 (ref 7–16)
Calcium, Total: 8.3 mg/dL — ABNORMAL LOW (ref 8.5–10.1)
Chloride: 105 mmol/L (ref 98–107)
Creatinine: 1.16 mg/dL (ref 0.60–1.30)
EGFR (African American): 59 — ABNORMAL LOW
EGFR (Non-African Amer.): 49 — ABNORMAL LOW
Glucose: 250 mg/dL — ABNORMAL HIGH (ref 65–99)
Potassium: 3.9 mmol/L (ref 3.5–5.1)
Sodium: 141 mmol/L (ref 136–145)

## 2011-08-08 LAB — CBC WITH DIFFERENTIAL/PLATELET
Basophil #: 0.1 10*3/uL (ref 0.0–0.1)
Basophil %: 0.5 %
Eosinophil %: 1.1 %
Lymphocyte #: 2.4 10*3/uL (ref 1.0–3.6)
Lymphocyte %: 24.4 %
MCH: 29.8 pg (ref 26.0–34.0)
MCV: 92 fL (ref 80–100)
Monocyte %: 10.2 %
Neutrophil #: 6.2 10*3/uL (ref 1.4–6.5)
RBC: 2.85 10*6/uL — ABNORMAL LOW (ref 3.80–5.20)

## 2011-08-08 LAB — PROTIME-INR: INR: 2.4

## 2011-08-09 LAB — URINALYSIS, COMPLETE
Bilirubin,UR: NEGATIVE
Nitrite: NEGATIVE
Ph: 5 (ref 4.5–8.0)
Protein: 100
RBC,UR: 23 /HPF (ref 0–5)

## 2011-08-09 LAB — PROTIME-INR: INR: 2.7

## 2011-08-10 LAB — PROTIME-INR
INR: 2.7
Prothrombin Time: 29.1 secs — ABNORMAL HIGH (ref 11.5–14.7)

## 2011-08-11 LAB — MAGNESIUM: Magnesium: 1.2 mg/dL — ABNORMAL LOW

## 2011-08-11 LAB — HEMATOCRIT: HCT: 28.8 % — ABNORMAL LOW (ref 35.0–47.0)

## 2011-08-11 LAB — HEMOGLOBIN: HGB: 9.4 g/dL — ABNORMAL LOW (ref 12.0–16.0)

## 2011-08-11 LAB — CLOSTRIDIUM DIFFICILE BY PCR

## 2011-08-12 LAB — CREATININE, SERUM
Creatinine: 1.29 mg/dL (ref 0.60–1.30)
EGFR (African American): 52 — ABNORMAL LOW
EGFR (Non-African Amer.): 43 — ABNORMAL LOW

## 2011-08-12 LAB — CBC WITH DIFFERENTIAL/PLATELET
Basophil #: 0 10*3/uL (ref 0.0–0.1)
Eosinophil #: 0.2 10*3/uL (ref 0.0–0.7)
Eosinophil %: 3.5 %
HCT: 30.8 % — ABNORMAL LOW (ref 35.0–47.0)
HGB: 10 g/dL — ABNORMAL LOW (ref 12.0–16.0)
Lymphocyte #: 2.1 10*3/uL (ref 1.0–3.6)
MCHC: 32.5 g/dL (ref 32.0–36.0)
MCV: 91 fL (ref 80–100)
Monocyte #: 0.6 10*3/uL (ref 0.0–0.7)
Monocyte %: 9 %
RBC: 3.38 10*6/uL — ABNORMAL LOW (ref 3.80–5.20)
RDW: 17.6 % — ABNORMAL HIGH (ref 11.5–14.5)
WBC: 6.5 10*3/uL (ref 3.6–11.0)

## 2011-08-12 LAB — URINE CULTURE

## 2011-08-12 LAB — PROTIME-INR: INR: 2.7

## 2011-08-12 LAB — CULTURE, BLOOD (SINGLE)

## 2011-08-12 LAB — MAGNESIUM: Magnesium: 1.9 mg/dL

## 2011-08-13 LAB — CBC WITH DIFFERENTIAL/PLATELET
Basophil #: 0 10*3/uL (ref 0.0–0.1)
HCT: 30.5 % — ABNORMAL LOW (ref 35.0–47.0)
HGB: 9.8 g/dL — ABNORMAL LOW (ref 12.0–16.0)
Lymphocyte #: 2.2 10*3/uL (ref 1.0–3.6)
Lymphocyte %: 26.2 %
MCH: 29.3 pg (ref 26.0–34.0)
MCHC: 32.1 g/dL (ref 32.0–36.0)
Monocyte #: 0.7 10*3/uL (ref 0.0–0.7)
Neutrophil #: 5.2 10*3/uL (ref 1.4–6.5)
Neutrophil %: 60.9 %
Platelet: 370 10*3/uL (ref 150–440)
RBC: 3.34 10*6/uL — ABNORMAL LOW (ref 3.80–5.20)

## 2011-08-19 LAB — CBC CANCER CENTER
Basophil %: 0.5 %
Eosinophil #: 0.2 x10 3/mm (ref 0.0–0.7)
Eosinophil %: 2.5 %
HGB: 9.8 g/dL — ABNORMAL LOW (ref 12.0–16.0)
Lymphocyte #: 2.2 x10 3/mm (ref 1.0–3.6)
Lymphocyte %: 22.7 %
MCH: 30.1 pg (ref 26.0–34.0)
MCHC: 33.6 g/dL (ref 32.0–36.0)
MCV: 90 fL (ref 80–100)
Monocyte #: 0.7 x10 3/mm (ref 0.0–0.7)
Neutrophil %: 67.4 %
RBC: 3.27 10*6/uL — ABNORMAL LOW (ref 3.80–5.20)
RDW: 17.4 % — ABNORMAL HIGH (ref 11.5–14.5)

## 2011-08-19 LAB — CREATININE, SERUM
Creatinine: 1.59 mg/dL — ABNORMAL HIGH (ref 0.60–1.30)
EGFR (African American): 41 — ABNORMAL LOW
EGFR (Non-African Amer.): 34 — ABNORMAL LOW

## 2011-08-19 LAB — PROTIME-INR
INR: 1
Prothrombin Time: 14 secs (ref 11.5–14.7)

## 2011-08-19 LAB — MAGNESIUM: Magnesium: 1.7 mg/dL — ABNORMAL LOW

## 2011-08-21 LAB — CREATININE, SERUM
Creatinine: 1.49 mg/dL — ABNORMAL HIGH (ref 0.60–1.30)
EGFR (African American): 44 — ABNORMAL LOW
EGFR (Non-African Amer.): 36 — ABNORMAL LOW

## 2011-08-21 LAB — POTASSIUM: Potassium: 3.9 mmol/L (ref 3.5–5.1)

## 2011-08-21 LAB — PROTIME-INR: INR: 1.3

## 2011-08-21 LAB — MAGNESIUM: Magnesium: 1.7 mg/dL — ABNORMAL LOW

## 2011-08-22 LAB — CA 125: CA 125: 87.6 U/mL — ABNORMAL HIGH (ref 0.0–34.0)

## 2011-08-25 LAB — MAGNESIUM: Magnesium: 1.7 mg/dL — ABNORMAL LOW

## 2011-08-25 LAB — POTASSIUM: Potassium: 4 mmol/L (ref 3.5–5.1)

## 2011-08-25 LAB — PROTIME-INR: INR: 2

## 2011-08-25 LAB — CREATININE, SERUM: EGFR (Non-African Amer.): 34 — ABNORMAL LOW

## 2011-08-31 LAB — COMPREHENSIVE METABOLIC PANEL
Albumin: 2.8 g/dL — ABNORMAL LOW (ref 3.4–5.0)
Alkaline Phosphatase: 62 U/L (ref 50–136)
BUN: 28 mg/dL — ABNORMAL HIGH (ref 7–18)
Bilirubin,Total: 0.1 mg/dL — ABNORMAL LOW (ref 0.2–1.0)
Calcium, Total: 8.8 mg/dL (ref 8.5–10.1)
Chloride: 104 mmol/L (ref 98–107)
Creatinine: 1.4 mg/dL — ABNORMAL HIGH (ref 0.60–1.30)
EGFR (African American): 47 — ABNORMAL LOW
EGFR (Non-African Amer.): 39 — ABNORMAL LOW
Glucose: 289 mg/dL — ABNORMAL HIGH (ref 65–99)
SGOT(AST): 14 U/L — ABNORMAL LOW (ref 15–37)
SGPT (ALT): 17 U/L
Sodium: 139 mmol/L (ref 136–145)

## 2011-08-31 LAB — CBC CANCER CENTER
Eosinophil %: 2.4 %
HCT: 32.4 % — ABNORMAL LOW (ref 35.0–47.0)
HGB: 10.7 g/dL — ABNORMAL LOW (ref 12.0–16.0)
Lymphocyte %: 26.4 %
MCHC: 33 g/dL (ref 32.0–36.0)
Monocyte #: 0.6 x10 3/mm (ref 0.0–0.7)
Monocyte %: 6.8 %
Neutrophil #: 6.1 x10 3/mm (ref 1.4–6.5)
Neutrophil %: 64.1 %
Platelet: 297 x10 3/mm (ref 150–440)
RDW: 16.6 % — ABNORMAL HIGH (ref 11.5–14.5)
WBC: 9.5 x10 3/mm (ref 3.6–11.0)

## 2011-08-31 LAB — PROTIME-INR: INR: 2.4

## 2011-09-07 ENCOUNTER — Ambulatory Visit: Payer: Self-pay | Admitting: Internal Medicine

## 2011-09-07 ENCOUNTER — Ambulatory Visit: Payer: Self-pay | Admitting: Gynecologic Oncology

## 2011-09-07 LAB — PROTIME-INR
INR: 2.5
Prothrombin Time: 27.4 secs — ABNORMAL HIGH (ref 11.5–14.7)

## 2011-09-07 LAB — MAGNESIUM: Magnesium: 1.4 mg/dL — ABNORMAL LOW

## 2011-09-07 LAB — CREATININE, SERUM: EGFR (Non-African Amer.): 41 — ABNORMAL LOW

## 2011-09-07 LAB — POTASSIUM: Potassium: 4.2 mmol/L (ref 3.5–5.1)

## 2011-09-14 LAB — HEPATIC FUNCTION PANEL A (ARMC)
Albumin: 2.7 g/dL — ABNORMAL LOW (ref 3.4–5.0)
Alkaline Phosphatase: 72 U/L (ref 50–136)
Bilirubin, Direct: 0 mg/dL (ref 0.00–0.20)
SGOT(AST): 14 U/L — ABNORMAL LOW (ref 15–37)
SGPT (ALT): 14 U/L
Total Protein: 7.2 g/dL (ref 6.4–8.2)

## 2011-09-14 LAB — CBC CANCER CENTER
Basophil #: 0 x10 3/mm (ref 0.0–0.1)
Basophil %: 0.2 %
Eosinophil #: 0.1 x10 3/mm (ref 0.0–0.7)
Eosinophil %: 0.9 %
HCT: 30.6 % — ABNORMAL LOW (ref 35.0–47.0)
HGB: 10.2 g/dL — ABNORMAL LOW (ref 12.0–16.0)
Lymphocyte %: 17.4 %
MCH: 29 pg (ref 26.0–34.0)
MCHC: 33.4 g/dL (ref 32.0–36.0)
Monocyte #: 0.8 x10 3/mm — ABNORMAL HIGH (ref 0.0–0.7)
Monocyte %: 7 %
Neutrophil #: 8.5 x10 3/mm — ABNORMAL HIGH (ref 1.4–6.5)
Platelet: 318 x10 3/mm (ref 150–440)

## 2011-09-14 LAB — PROTIME-INR: Prothrombin Time: 34.8 secs — ABNORMAL HIGH (ref 11.5–14.7)

## 2011-09-14 LAB — MAGNESIUM: Magnesium: 1.6 mg/dL — ABNORMAL LOW

## 2011-09-14 LAB — POTASSIUM: Potassium: 4.2 mmol/L (ref 3.5–5.1)

## 2011-09-16 LAB — CREATININE, SERUM: Creatinine: 1.55 mg/dL — ABNORMAL HIGH (ref 0.60–1.30)

## 2011-09-16 LAB — PROTIME-INR: INR: 1.6

## 2011-09-16 LAB — POTASSIUM: Potassium: 4.4 mmol/L (ref 3.5–5.1)

## 2011-09-21 LAB — CREATININE, SERUM
Creatinine: 1.53 mg/dL — ABNORMAL HIGH (ref 0.60–1.30)
EGFR (African American): 38 — ABNORMAL LOW

## 2011-09-21 LAB — PROTIME-INR
INR: 2.3
Prothrombin Time: 25.4 secs — ABNORMAL HIGH (ref 11.5–14.7)

## 2011-09-25 LAB — CREATININE, SERUM
Creatinine: 1.51 mg/dL — ABNORMAL HIGH (ref 0.60–1.30)
EGFR (African American): 39 — ABNORMAL LOW
EGFR (Non-African Amer.): 34 — ABNORMAL LOW

## 2011-09-25 LAB — MAGNESIUM: Magnesium: 1.7 mg/dL — ABNORMAL LOW

## 2011-09-25 LAB — PROTIME-INR: INR: 2.3

## 2011-09-30 LAB — MAGNESIUM: Magnesium: 1.6 mg/dL — ABNORMAL LOW

## 2011-09-30 LAB — CANCER CENTER HEMOGLOBIN: HGB: 10.1 g/dL — ABNORMAL LOW (ref 12.0–16.0)

## 2011-09-30 LAB — CREATININE, SERUM
Creatinine: 1.35 mg/dL — ABNORMAL HIGH (ref 0.60–1.30)
EGFR (African American): 45 — ABNORMAL LOW
EGFR (Non-African Amer.): 39 — ABNORMAL LOW

## 2011-09-30 LAB — PROTIME-INR: Prothrombin Time: 19.8 secs — ABNORMAL HIGH (ref 11.5–14.7)

## 2011-10-05 LAB — PLATELET COUNT: Platelet: 394 10*3/uL (ref 150–440)

## 2011-10-05 LAB — POTASSIUM: Potassium: 4.3 mmol/L (ref 3.5–5.1)

## 2011-10-05 LAB — MAGNESIUM: Magnesium: 1.8 mg/dL

## 2011-10-05 LAB — PROTIME-INR: INR: 2.4

## 2011-10-06 LAB — CA 125: CA 125: 41.9 U/mL — ABNORMAL HIGH (ref 0.0–34.0)

## 2011-10-07 ENCOUNTER — Ambulatory Visit: Payer: Self-pay | Admitting: Gynecologic Oncology

## 2011-10-07 ENCOUNTER — Ambulatory Visit: Payer: Self-pay | Admitting: Internal Medicine

## 2011-10-12 LAB — CBC CANCER CENTER
Basophil #: 0 x10 3/mm (ref 0.0–0.1)
Basophil %: 0.4 %
Eosinophil %: 3.2 %
HCT: 32 % — ABNORMAL LOW (ref 35.0–47.0)
HGB: 10.1 g/dL — ABNORMAL LOW (ref 12.0–16.0)
Lymphocyte %: 24.7 %
MCHC: 31.6 g/dL — ABNORMAL LOW (ref 32.0–36.0)
MCV: 87 fL (ref 80–100)
Monocyte #: 0.6 x10 3/mm (ref 0.2–0.9)
Monocyte %: 6.4 %
Platelet: 347 x10 3/mm (ref 150–440)
RBC: 3.69 10*6/uL — ABNORMAL LOW (ref 3.80–5.20)
WBC: 9 x10 3/mm (ref 3.6–11.0)

## 2011-10-12 LAB — PROTIME-INR: Prothrombin Time: 20.7 secs — ABNORMAL HIGH (ref 11.5–14.7)

## 2011-10-12 LAB — MAGNESIUM: Magnesium: 1.5 mg/dL — ABNORMAL LOW

## 2011-10-12 LAB — CREATININE, SERUM
Creatinine: 1.33 mg/dL — ABNORMAL HIGH (ref 0.60–1.30)
EGFR (African American): 46 — ABNORMAL LOW
EGFR (Non-African Amer.): 39 — ABNORMAL LOW

## 2011-10-12 LAB — POTASSIUM: Potassium: 4.3 mmol/L (ref 3.5–5.1)

## 2011-10-19 LAB — MAGNESIUM: Magnesium: 1.6 mg/dL — ABNORMAL LOW

## 2011-10-19 LAB — PROTIME-INR: Prothrombin Time: 23.1 secs — ABNORMAL HIGH (ref 11.5–14.7)

## 2011-10-19 LAB — POTASSIUM: Potassium: 4 mmol/L (ref 3.5–5.1)

## 2011-10-23 LAB — CBC CANCER CENTER
Basophil #: 0 x10 3/mm (ref 0.0–0.1)
Basophil %: 0.6 %
Eosinophil #: 0.2 x10 3/mm (ref 0.0–0.7)
Eosinophil %: 2.7 %
HCT: 32.4 % — ABNORMAL LOW (ref 35.0–47.0)
HGB: 10.1 g/dL — ABNORMAL LOW (ref 12.0–16.0)
Lymphocyte %: 26.2 %
MCH: 27.2 pg (ref 26.0–34.0)
MCHC: 31.3 g/dL — ABNORMAL LOW (ref 32.0–36.0)
MCV: 87 fL (ref 80–100)
Monocyte #: 0.6 x10 3/mm (ref 0.2–0.9)
Monocyte %: 7.6 %
Neutrophil #: 5.4 x10 3/mm (ref 1.4–6.5)
Neutrophil %: 62.9 %
RBC: 3.73 10*6/uL — ABNORMAL LOW (ref 3.80–5.20)

## 2011-10-23 LAB — BASIC METABOLIC PANEL
Anion Gap: 13 (ref 7–16)
BUN: 28 mg/dL — ABNORMAL HIGH (ref 7–18)
Chloride: 103 mmol/L (ref 98–107)
Co2: 24 mmol/L (ref 21–32)
Creatinine: 1.19 mg/dL (ref 0.60–1.30)
EGFR (Non-African Amer.): 45 — ABNORMAL LOW

## 2011-10-23 LAB — MAGNESIUM: Magnesium: 1.6 mg/dL — ABNORMAL LOW

## 2011-10-23 LAB — PROTIME-INR: INR: 1.5

## 2011-10-26 LAB — BASIC METABOLIC PANEL
Anion Gap: 14 (ref 7–16)
BUN: 24 mg/dL — ABNORMAL HIGH (ref 7–18)
Calcium, Total: 8.8 mg/dL (ref 8.5–10.1)
Co2: 23 mmol/L (ref 21–32)
EGFR (African American): 50 — ABNORMAL LOW
EGFR (Non-African Amer.): 43 — ABNORMAL LOW
Osmolality: 288 (ref 275–301)
Potassium: 4.2 mmol/L (ref 3.5–5.1)
Sodium: 141 mmol/L (ref 136–145)

## 2011-10-26 LAB — PROTIME-INR: Prothrombin Time: 16.5 secs — ABNORMAL HIGH (ref 11.5–14.7)

## 2011-10-26 LAB — MAGNESIUM: Magnesium: 1.9 mg/dL

## 2011-10-30 LAB — MAGNESIUM: Magnesium: 1.8 mg/dL

## 2011-10-30 LAB — CREATININE, SERUM
Creatinine: 1.27 mg/dL (ref 0.60–1.30)
EGFR (African American): 48 — ABNORMAL LOW

## 2011-10-30 LAB — CALCIUM: Calcium, Total: 8.8 mg/dL (ref 8.5–10.1)

## 2011-10-30 LAB — PROTIME-INR: Prothrombin Time: 17.3 secs — ABNORMAL HIGH (ref 11.5–14.7)

## 2011-10-30 LAB — POTASSIUM: Potassium: 4.3 mmol/L (ref 3.5–5.1)

## 2011-10-31 LAB — CLOSTRIDIUM DIFFICILE BY PCR

## 2011-11-05 LAB — MAGNESIUM: Magnesium: 1.5 mg/dL — ABNORMAL LOW

## 2011-11-05 LAB — POTASSIUM: Potassium: 4 mmol/L (ref 3.5–5.1)

## 2011-11-05 LAB — PROTIME-INR
INR: 1.7
Prothrombin Time: 20.6 secs — ABNORMAL HIGH (ref 11.5–14.7)

## 2011-11-06 LAB — CA 125: CA 125: 50.8 U/mL — ABNORMAL HIGH (ref 0.0–34.0)

## 2011-11-07 ENCOUNTER — Ambulatory Visit: Payer: Self-pay | Admitting: Gynecologic Oncology

## 2011-11-07 ENCOUNTER — Ambulatory Visit: Payer: Self-pay | Admitting: Internal Medicine

## 2011-11-11 LAB — PROTIME-INR: INR: 2.1

## 2011-11-11 LAB — MAGNESIUM: Magnesium: 1.8 mg/dL

## 2011-11-16 LAB — URINALYSIS, COMPLETE
Ph: 6 (ref 4.5–8.0)
Protein: 100
Squamous Epithelial: NONE SEEN
WBC UR: 2811 /HPF (ref 0–5)

## 2011-11-18 LAB — URINE CULTURE

## 2011-11-23 LAB — MAGNESIUM: Magnesium: 1.7 mg/dL — ABNORMAL LOW

## 2011-11-23 LAB — CBC CANCER CENTER
Eosinophil #: 0.2 x10 3/mm (ref 0.0–0.7)
Eosinophil %: 2.4 %
Lymphocyte #: 2.5 x10 3/mm (ref 1.0–3.6)
Lymphocyte %: 29.6 %
MCH: 27 pg (ref 26.0–34.0)
MCHC: 31.8 g/dL — ABNORMAL LOW (ref 32.0–36.0)
MCV: 85 fL (ref 80–100)
Monocyte %: 9.4 %
Neutrophil #: 4.8 x10 3/mm (ref 1.4–6.5)
Platelet: 335 x10 3/mm (ref 150–440)

## 2011-11-23 LAB — CREATININE, SERUM
Creatinine: 1.51 mg/dL — ABNORMAL HIGH (ref 0.60–1.30)
EGFR (African American): 39 — ABNORMAL LOW
EGFR (Non-African Amer.): 34 — ABNORMAL LOW

## 2011-11-23 LAB — PROTIME-INR
INR: 2.6
Prothrombin Time: 28 secs — ABNORMAL HIGH (ref 11.5–14.7)

## 2011-11-30 LAB — PROTIME-INR: INR: 2.6

## 2011-11-30 LAB — URINALYSIS, COMPLETE
Ketone: NEGATIVE
Ph: 5 (ref 4.5–8.0)
Protein: 100
RBC,UR: 121 /HPF (ref 0–5)
Squamous Epithelial: 2
WBC UR: 3096 /HPF (ref 0–5)

## 2011-11-30 LAB — CREATININE, SERUM
Creatinine: 1.45 mg/dL — ABNORMAL HIGH (ref 0.60–1.30)
EGFR (Non-African Amer.): 35 — ABNORMAL LOW

## 2011-12-02 LAB — URINE CULTURE

## 2011-12-07 ENCOUNTER — Ambulatory Visit: Payer: Self-pay | Admitting: Gynecologic Oncology

## 2011-12-07 ENCOUNTER — Ambulatory Visit: Payer: Self-pay | Admitting: Internal Medicine

## 2011-12-07 LAB — CBC CANCER CENTER
Basophil #: 0 x10 3/mm (ref 0.0–0.1)
Eosinophil #: 0.2 x10 3/mm (ref 0.0–0.7)
Eosinophil %: 1.9 %
HGB: 10.9 g/dL — ABNORMAL LOW (ref 12.0–16.0)
Lymphocyte #: 2.8 x10 3/mm (ref 1.0–3.6)
Lymphocyte %: 29.7 %
MCH: 26.6 pg (ref 26.0–34.0)
MCHC: 31.6 g/dL — ABNORMAL LOW (ref 32.0–36.0)
Monocyte #: 0.8 x10 3/mm (ref 0.2–0.9)
Neutrophil #: 5.6 x10 3/mm (ref 1.4–6.5)
RBC: 4.09 10*6/uL (ref 3.80–5.20)
RDW: 16.7 % — ABNORMAL HIGH (ref 11.5–14.5)
WBC: 9.3 x10 3/mm (ref 3.6–11.0)

## 2011-12-07 LAB — CREATININE, SERUM
Creatinine: 1.43 mg/dL — ABNORMAL HIGH (ref 0.60–1.30)
EGFR (Non-African Amer.): 36 — ABNORMAL LOW

## 2011-12-07 LAB — MAGNESIUM: Magnesium: 1.7 mg/dL — ABNORMAL LOW

## 2011-12-07 LAB — PROTIME-INR: INR: 3.1

## 2011-12-08 LAB — CA 125: CA 125: 54.3 U/mL — ABNORMAL HIGH (ref 0.0–34.0)

## 2011-12-21 LAB — PROTIME-INR: INR: 1.8

## 2011-12-28 LAB — CREATININE, SERUM: Creatinine: 1.56 mg/dL — ABNORMAL HIGH (ref 0.60–1.30)

## 2011-12-28 LAB — MAGNESIUM: Magnesium: 1.7 mg/dL — ABNORMAL LOW

## 2011-12-28 LAB — PROTIME-INR: Prothrombin Time: 24.1 secs — ABNORMAL HIGH (ref 11.5–14.7)

## 2012-01-05 LAB — PATHOLOGY REPORT

## 2012-01-06 DIAGNOSIS — R809 Proteinuria, unspecified: Secondary | ICD-10-CM | POA: Insufficient documentation

## 2012-01-06 DIAGNOSIS — H3500 Unspecified background retinopathy: Secondary | ICD-10-CM | POA: Insufficient documentation

## 2012-01-06 DIAGNOSIS — I82409 Acute embolism and thrombosis of unspecified deep veins of unspecified lower extremity: Secondary | ICD-10-CM | POA: Insufficient documentation

## 2012-01-06 DIAGNOSIS — K635 Polyp of colon: Secondary | ICD-10-CM | POA: Insufficient documentation

## 2012-01-06 DIAGNOSIS — R262 Difficulty in walking, not elsewhere classified: Secondary | ICD-10-CM | POA: Insufficient documentation

## 2012-01-06 DIAGNOSIS — Z201 Contact with and (suspected) exposure to tuberculosis: Secondary | ICD-10-CM | POA: Insufficient documentation

## 2012-01-06 DIAGNOSIS — C541 Malignant neoplasm of endometrium: Secondary | ICD-10-CM | POA: Insufficient documentation

## 2012-01-06 DIAGNOSIS — I739 Peripheral vascular disease, unspecified: Secondary | ICD-10-CM | POA: Insufficient documentation

## 2012-01-07 ENCOUNTER — Ambulatory Visit: Payer: Self-pay | Admitting: Gynecologic Oncology

## 2012-01-07 ENCOUNTER — Ambulatory Visit: Payer: Self-pay | Admitting: Internal Medicine

## 2012-01-11 LAB — CBC CANCER CENTER
Basophil #: 0.1 x10 3/mm (ref 0.0–0.1)
Basophil %: 0.6 %
Eosinophil #: 0.2 x10 3/mm (ref 0.0–0.7)
Eosinophil %: 1.8 %
Lymphocyte #: 2.9 x10 3/mm (ref 1.0–3.6)
Lymphocyte %: 32.3 %
Monocyte #: 0.6 x10 3/mm (ref 0.2–0.9)
Monocyte %: 7.1 %
Neutrophil #: 5.2 x10 3/mm (ref 1.4–6.5)
Platelet: 350 x10 3/mm (ref 150–440)
RDW: 17 % — ABNORMAL HIGH (ref 11.5–14.5)
WBC: 8.9 x10 3/mm (ref 3.6–11.0)

## 2012-01-11 LAB — MAGNESIUM: Magnesium: 1.7 mg/dL — ABNORMAL LOW

## 2012-01-11 LAB — CREATININE, SERUM
Creatinine: 1.42 mg/dL — ABNORMAL HIGH (ref 0.60–1.30)
EGFR (African American): 42 — ABNORMAL LOW
EGFR (Non-African Amer.): 36 — ABNORMAL LOW

## 2012-01-11 LAB — PROTIME-INR: Prothrombin Time: 27.8 secs — ABNORMAL HIGH (ref 11.5–14.7)

## 2012-01-12 LAB — CA 125: CA 125: 80.1 U/mL — ABNORMAL HIGH (ref 0.0–34.0)

## 2012-01-25 LAB — PROTIME-INR
INR: 2.4
Prothrombin Time: 26.1 secs — ABNORMAL HIGH (ref 11.5–14.7)

## 2012-01-25 LAB — POTASSIUM: Potassium: 4.2 mmol/L (ref 3.5–5.1)

## 2012-02-07 ENCOUNTER — Ambulatory Visit: Payer: Self-pay | Admitting: Internal Medicine

## 2012-02-07 ENCOUNTER — Ambulatory Visit: Payer: Self-pay | Admitting: Gynecologic Oncology

## 2012-02-10 LAB — CBC CANCER CENTER
Basophil #: 0 x10 3/mm (ref 0.0–0.1)
Eosinophil #: 0.2 x10 3/mm (ref 0.0–0.7)
Eosinophil %: 2.1 %
HCT: 34.2 % — ABNORMAL LOW (ref 35.0–47.0)
HGB: 10.7 g/dL — ABNORMAL LOW (ref 12.0–16.0)
Lymphocyte %: 30.9 %
MCH: 26.7 pg (ref 26.0–34.0)
Monocyte %: 9.6 %
Neutrophil #: 5.1 x10 3/mm (ref 1.4–6.5)
Neutrophil %: 56.9 %
Platelet: 300 x10 3/mm (ref 150–440)
RBC: 3.99 10*6/uL (ref 3.80–5.20)
WBC: 9 x10 3/mm (ref 3.6–11.0)

## 2012-02-10 LAB — CREATININE, SERUM: EGFR (African American): 38 — ABNORMAL LOW

## 2012-02-10 LAB — PROTIME-INR
INR: 2.1
Prothrombin Time: 24.1 secs — ABNORMAL HIGH (ref 11.5–14.7)

## 2012-02-24 LAB — PROTIME-INR: INR: 1.9

## 2012-02-24 LAB — CREATININE, SERUM
Creatinine: 1.58 mg/dL — ABNORMAL HIGH (ref 0.60–1.30)
EGFR (African American): 37 — ABNORMAL LOW

## 2012-03-08 ENCOUNTER — Ambulatory Visit: Payer: Self-pay | Admitting: Internal Medicine

## 2012-03-08 ENCOUNTER — Ambulatory Visit: Payer: Self-pay | Admitting: Gynecologic Oncology

## 2012-03-09 LAB — PROTIME-INR: Prothrombin Time: 27.4 secs — ABNORMAL HIGH (ref 11.5–14.7)

## 2012-03-09 LAB — COMPREHENSIVE METABOLIC PANEL
Calcium, Total: 8.7 mg/dL (ref 8.5–10.1)
Co2: 22 mmol/L (ref 21–32)
EGFR (African American): 46 — ABNORMAL LOW
Potassium: 4 mmol/L (ref 3.5–5.1)
Sodium: 141 mmol/L (ref 136–145)
Total Protein: 7.1 g/dL (ref 6.4–8.2)

## 2012-03-23 DIAGNOSIS — N39498 Other specified urinary incontinence: Secondary | ICD-10-CM | POA: Insufficient documentation

## 2012-03-23 DIAGNOSIS — N3642 Intrinsic sphincter deficiency (ISD): Secondary | ICD-10-CM | POA: Insufficient documentation

## 2012-03-23 DIAGNOSIS — C549 Malignant neoplasm of corpus uteri, unspecified: Secondary | ICD-10-CM | POA: Insufficient documentation

## 2012-03-23 DIAGNOSIS — N3020 Other chronic cystitis without hematuria: Secondary | ICD-10-CM | POA: Insufficient documentation

## 2012-03-23 DIAGNOSIS — N3281 Overactive bladder: Secondary | ICD-10-CM | POA: Insufficient documentation

## 2012-03-23 DIAGNOSIS — N3040 Irradiation cystitis without hematuria: Secondary | ICD-10-CM | POA: Insufficient documentation

## 2012-03-23 DIAGNOSIS — N319 Neuromuscular dysfunction of bladder, unspecified: Secondary | ICD-10-CM | POA: Insufficient documentation

## 2012-03-23 DIAGNOSIS — D414 Neoplasm of uncertain behavior of bladder: Secondary | ICD-10-CM | POA: Insufficient documentation

## 2012-03-23 DIAGNOSIS — R339 Retention of urine, unspecified: Secondary | ICD-10-CM | POA: Insufficient documentation

## 2012-03-23 DIAGNOSIS — N393 Stress incontinence (female) (male): Secondary | ICD-10-CM | POA: Insufficient documentation

## 2012-04-06 LAB — CBC CANCER CENTER
Basophil %: 0.5 %
Eosinophil #: 0.2 x10 3/mm (ref 0.0–0.7)
HCT: 34.7 % — ABNORMAL LOW (ref 35.0–47.0)
Lymphocyte #: 2.8 x10 3/mm (ref 1.0–3.6)
MCH: 26.8 pg (ref 26.0–34.0)
MCHC: 31.1 g/dL — ABNORMAL LOW (ref 32.0–36.0)
MCV: 86 fL (ref 80–100)
Monocyte #: 0.7 x10 3/mm (ref 0.2–0.9)
Neutrophil #: 5.4 x10 3/mm (ref 1.4–6.5)
Neutrophil %: 59.3 %
Platelet: 296 x10 3/mm (ref 150–440)
RDW: 17 % — ABNORMAL HIGH (ref 11.5–14.5)
WBC: 9.1 x10 3/mm (ref 3.6–11.0)

## 2012-04-06 LAB — CREATININE, SERUM
EGFR (African American): 44 — ABNORMAL LOW
EGFR (Non-African Amer.): 38 — ABNORMAL LOW

## 2012-04-06 LAB — PROTIME-INR
INR: 2.2
Prothrombin Time: 24.4 secs — ABNORMAL HIGH (ref 11.5–14.7)

## 2012-04-07 LAB — CA 125: CA 125: 152.6 U/mL — ABNORMAL HIGH (ref 0.0–34.0)

## 2012-04-08 ENCOUNTER — Ambulatory Visit: Payer: Self-pay | Admitting: Gynecologic Oncology

## 2012-04-08 ENCOUNTER — Ambulatory Visit: Payer: Self-pay | Admitting: Internal Medicine

## 2012-04-12 DIAGNOSIS — Z0189 Encounter for other specified special examinations: Secondary | ICD-10-CM | POA: Insufficient documentation

## 2012-05-02 LAB — CBC CANCER CENTER
Basophil #: 0.1 x10 3/mm (ref 0.0–0.1)
Basophil %: 0.8 %
Eosinophil #: 0.2 x10 3/mm (ref 0.0–0.7)
Eosinophil %: 2.1 %
HGB: 10.4 g/dL — ABNORMAL LOW (ref 12.0–16.0)
MCH: 27.3 pg (ref 26.0–34.0)
MCHC: 31.9 g/dL — ABNORMAL LOW (ref 32.0–36.0)
Monocyte #: 0.7 x10 3/mm (ref 0.2–0.9)
Neutrophil %: 56.7 %
Platelet: 347 x10 3/mm (ref 150–440)
RDW: 16.6 % — ABNORMAL HIGH (ref 11.5–14.5)

## 2012-05-02 LAB — COMPREHENSIVE METABOLIC PANEL
Albumin: 2.6 g/dL — ABNORMAL LOW (ref 3.4–5.0)
Alkaline Phosphatase: 64 U/L (ref 50–136)
Anion Gap: 11 (ref 7–16)
BUN: 38 mg/dL — ABNORMAL HIGH (ref 7–18)
Calcium, Total: 8.8 mg/dL (ref 8.5–10.1)
Creatinine: 1.45 mg/dL — ABNORMAL HIGH (ref 0.60–1.30)
EGFR (African American): 41 — ABNORMAL LOW
Glucose: 140 mg/dL — ABNORMAL HIGH (ref 65–99)
Potassium: 4.3 mmol/L (ref 3.5–5.1)
SGOT(AST): 13 U/L — ABNORMAL LOW (ref 15–37)
Total Protein: 7 g/dL (ref 6.4–8.2)

## 2012-05-02 LAB — PROTIME-INR
INR: 1.9
Prothrombin Time: 22.4 secs — ABNORMAL HIGH (ref 11.5–14.7)

## 2012-05-02 LAB — MAGNESIUM: Magnesium: 1.9 mg/dL

## 2012-05-08 ENCOUNTER — Ambulatory Visit: Payer: Self-pay | Admitting: Internal Medicine

## 2012-05-08 ENCOUNTER — Ambulatory Visit: Payer: Self-pay | Admitting: Gynecologic Oncology

## 2012-05-26 LAB — CBC CANCER CENTER
Basophil %: 1.1 %
Eosinophil %: 2.7 %
HCT: 33.8 % — ABNORMAL LOW (ref 35.0–47.0)
HGB: 10.9 g/dL — ABNORMAL LOW (ref 12.0–16.0)
Lymphocyte %: 32.6 %
MCHC: 32.3 g/dL (ref 32.0–36.0)
Monocyte %: 8.5 %
Neutrophil #: 4.8 x10 3/mm (ref 1.4–6.5)
Neutrophil %: 55.1 %
WBC: 8.7 x10 3/mm (ref 3.6–11.0)

## 2012-05-26 LAB — CREATININE, SERUM: Creatinine: 1.53 mg/dL — ABNORMAL HIGH (ref 0.60–1.30)

## 2012-05-26 LAB — PROTIME-INR
INR: 1.3
Prothrombin Time: 16.1 secs — ABNORMAL HIGH (ref 11.5–14.7)

## 2012-05-27 LAB — CA 125: CA 125: 285.8 U/mL — ABNORMAL HIGH (ref 0.0–34.0)

## 2012-05-31 LAB — PROTIME-INR
INR: 1.3
Prothrombin Time: 17 secs — ABNORMAL HIGH (ref 11.5–14.7)

## 2012-06-03 LAB — PROT IMMUNOELECTROPHORES(ARMC)

## 2012-06-07 ENCOUNTER — Ambulatory Visit: Payer: Self-pay | Admitting: Family Medicine

## 2012-06-07 LAB — PROTIME-INR: Prothrombin Time: 22 secs — ABNORMAL HIGH (ref 11.5–14.7)

## 2012-06-08 ENCOUNTER — Ambulatory Visit: Payer: Self-pay | Admitting: Gynecologic Oncology

## 2012-06-08 ENCOUNTER — Ambulatory Visit: Payer: Self-pay | Admitting: Internal Medicine

## 2012-06-09 ENCOUNTER — Ambulatory Visit: Payer: Self-pay | Admitting: Family Medicine

## 2012-06-09 LAB — CA 125: CA 125: 290.6 U/mL — ABNORMAL HIGH (ref 0.0–34.0)

## 2012-06-10 DIAGNOSIS — N63 Unspecified lump in unspecified breast: Secondary | ICD-10-CM | POA: Insufficient documentation

## 2012-06-15 LAB — PROTIME-INR
INR: 2.3
Prothrombin Time: 25.1 secs — ABNORMAL HIGH (ref 11.5–14.7)

## 2012-06-23 DIAGNOSIS — Z79891 Long term (current) use of opiate analgesic: Secondary | ICD-10-CM | POA: Insufficient documentation

## 2012-06-30 LAB — PROTIME-INR: Prothrombin Time: 27.8 secs — ABNORMAL HIGH (ref 11.5–14.7)

## 2012-07-01 LAB — CA 125: CA 125: 243.2 U/mL — ABNORMAL HIGH (ref 0.0–34.0)

## 2012-07-09 ENCOUNTER — Ambulatory Visit: Payer: Self-pay | Admitting: Gynecologic Oncology

## 2012-07-09 ENCOUNTER — Ambulatory Visit: Payer: Self-pay | Admitting: Internal Medicine

## 2012-07-27 LAB — CBC CANCER CENTER
Basophil #: 0.1 x10 3/mm (ref 0.0–0.1)
Basophil %: 0.6 %
Eosinophil %: 1.8 %
HGB: 12.4 g/dL (ref 12.0–16.0)
MCH: 27.7 pg (ref 26.0–34.0)
MCHC: 32.9 g/dL (ref 32.0–36.0)
Monocyte #: 0.6 x10 3/mm (ref 0.2–0.9)
Neutrophil #: 5.6 x10 3/mm (ref 1.4–6.5)
Neutrophil %: 59.4 %
Platelet: 287 x10 3/mm (ref 150–440)

## 2012-07-27 LAB — PROTIME-INR
INR: 2.7
Prothrombin Time: 28.7 secs — ABNORMAL HIGH (ref 11.5–14.7)

## 2012-07-27 LAB — CREATININE, SERUM
Creatinine: 1.55 mg/dL — ABNORMAL HIGH (ref 0.60–1.30)
EGFR (Non-African Amer.): 32 — ABNORMAL LOW

## 2012-07-28 LAB — CA 125: CA 125: 317.9 U/mL — ABNORMAL HIGH (ref 0.0–34.0)

## 2012-08-06 ENCOUNTER — Ambulatory Visit: Payer: Self-pay | Admitting: Internal Medicine

## 2012-08-06 ENCOUNTER — Ambulatory Visit: Payer: Self-pay | Admitting: Gynecologic Oncology

## 2012-08-10 LAB — CBC CANCER CENTER
Basophil #: 0.1 x10 3/mm (ref 0.0–0.1)
Basophil %: 0.6 %
Eosinophil #: 0.2 x10 3/mm (ref 0.0–0.7)
Eosinophil %: 2.3 %
HGB: 11.6 g/dL — ABNORMAL LOW (ref 12.0–16.0)
Lymphocyte %: 27.5 %
Monocyte %: 7.6 %
Neutrophil #: 5.8 x10 3/mm (ref 1.4–6.5)
Platelet: 249 x10 3/mm (ref 150–440)
RDW: 17.2 % — ABNORMAL HIGH (ref 11.5–14.5)

## 2012-08-10 LAB — PROTIME-INR
INR: 3
Prothrombin Time: 30.5 secs — ABNORMAL HIGH (ref 11.5–14.7)

## 2012-08-10 LAB — CREATININE, SERUM
Creatinine: 1.51 mg/dL — ABNORMAL HIGH (ref 0.60–1.30)
EGFR (Non-African Amer.): 33 — ABNORMAL LOW

## 2012-08-15 LAB — PROTIME-INR
INR: 2.6
Prothrombin Time: 26.8 secs — ABNORMAL HIGH (ref 11.5–14.7)

## 2012-08-16 ENCOUNTER — Encounter: Payer: Self-pay | Admitting: Family Medicine

## 2012-08-22 LAB — CREATININE, SERUM: EGFR (African American): 44 — ABNORMAL LOW

## 2012-08-22 LAB — PROTIME-INR
INR: 2.6
Prothrombin Time: 27.5 secs — ABNORMAL HIGH (ref 11.5–14.7)

## 2012-08-23 LAB — CA 125: CA 125: 450.1 U/mL — ABNORMAL HIGH (ref 0.0–34.0)

## 2012-09-06 ENCOUNTER — Ambulatory Visit: Payer: Self-pay | Admitting: Internal Medicine

## 2012-09-06 ENCOUNTER — Ambulatory Visit: Payer: Self-pay | Admitting: Gynecologic Oncology

## 2012-09-06 ENCOUNTER — Encounter: Payer: Self-pay | Admitting: Family Medicine

## 2012-09-09 ENCOUNTER — Inpatient Hospital Stay: Payer: Self-pay | Admitting: Internal Medicine

## 2012-09-09 LAB — URINALYSIS, COMPLETE
Bilirubin,UR: NEGATIVE
Glucose,UR: 150 mg/dL (ref 0–75)
Ketone: NEGATIVE
Nitrite: NEGATIVE
Protein: >=500
RBC,UR: 40 /HPF (ref 0–5)
WBC UR: 603 /HPF (ref 0–5)

## 2012-09-09 LAB — COMPREHENSIVE METABOLIC PANEL
Anion Gap: 8 (ref 7–16)
BUN: 24 mg/dL — ABNORMAL HIGH (ref 7–18)
Bilirubin,Total: 0.3 mg/dL (ref 0.2–1.0)
Chloride: 103 mmol/L (ref 98–107)
Co2: 25 mmol/L (ref 21–32)
Creatinine: 1.5 mg/dL — ABNORMAL HIGH (ref 0.60–1.30)
EGFR (African American): 39 — ABNORMAL LOW
Osmolality: 274 (ref 275–301)
Potassium: 3.8 mmol/L (ref 3.5–5.1)
SGOT(AST): 14 U/L — ABNORMAL LOW (ref 15–37)
SGPT (ALT): 11 U/L — ABNORMAL LOW (ref 12–78)
Sodium: 136 mmol/L (ref 136–145)

## 2012-09-09 LAB — CBC WITH DIFFERENTIAL/PLATELET
Eosinophil #: 0 10*3/uL (ref 0.0–0.7)
Eosinophil %: 0 %
HCT: 31.5 % — ABNORMAL LOW (ref 35.0–47.0)
Lymphocyte %: 19.5 %
MCH: 27.1 pg (ref 26.0–34.0)
MCHC: 32.4 g/dL (ref 32.0–36.0)
MCV: 84 fL (ref 80–100)
Monocyte #: 1.5 x10 3/mm — ABNORMAL HIGH (ref 0.2–0.9)
Monocyte %: 8.1 %
Neutrophil %: 72.2 %
Platelet: 252 10*3/uL (ref 150–440)
RBC: 3.77 10*6/uL — ABNORMAL LOW (ref 3.80–5.20)
WBC: 19.1 10*3/uL — ABNORMAL HIGH (ref 3.6–11.0)

## 2012-09-10 LAB — BASIC METABOLIC PANEL
Anion Gap: 6 — ABNORMAL LOW (ref 7–16)
BUN: 24 mg/dL — ABNORMAL HIGH (ref 7–18)
Calcium, Total: 7.8 mg/dL — ABNORMAL LOW (ref 8.5–10.1)
Chloride: 106 mmol/L (ref 98–107)
Creatinine: 1.43 mg/dL — ABNORMAL HIGH (ref 0.60–1.30)
EGFR (African American): 41 — ABNORMAL LOW
EGFR (Non-African Amer.): 36 — ABNORMAL LOW
Osmolality: 279 (ref 275–301)
Potassium: 4.1 mmol/L (ref 3.5–5.1)

## 2012-09-10 LAB — PROTIME-INR: INR: 3.6

## 2012-09-11 LAB — CBC WITH DIFFERENTIAL/PLATELET
Eosinophil #: 0 10*3/uL (ref 0.0–0.7)
Eosinophil %: 0.2 %
HCT: 28.2 % — ABNORMAL LOW (ref 35.0–47.0)
HGB: 9.2 g/dL — ABNORMAL LOW (ref 12.0–16.0)
Lymphocyte #: 1.9 10*3/uL (ref 1.0–3.6)
Lymphocyte %: 13.5 %
MCHC: 32.7 g/dL (ref 32.0–36.0)
MCV: 85 fL (ref 80–100)
Monocyte #: 1 x10 3/mm — ABNORMAL HIGH (ref 0.2–0.9)
Neutrophil #: 11.3 10*3/uL — ABNORMAL HIGH (ref 1.4–6.5)
Platelet: 239 10*3/uL (ref 150–440)
RBC: 3.33 10*6/uL — ABNORMAL LOW (ref 3.80–5.20)
RDW: 16.8 % — ABNORMAL HIGH (ref 11.5–14.5)
WBC: 14.4 10*3/uL — ABNORMAL HIGH (ref 3.6–11.0)

## 2012-09-11 LAB — PROTIME-INR: Prothrombin Time: 25.7 secs — ABNORMAL HIGH (ref 11.5–14.7)

## 2012-09-12 LAB — BASIC METABOLIC PANEL
Anion Gap: 7 (ref 7–16)
BUN: 25 mg/dL — ABNORMAL HIGH (ref 7–18)
Calcium, Total: 8 mg/dL — ABNORMAL LOW (ref 8.5–10.1)
Co2: 25 mmol/L (ref 21–32)
EGFR (Non-African Amer.): 31 — ABNORMAL LOW
Glucose: 167 mg/dL — ABNORMAL HIGH (ref 65–99)
Osmolality: 276 (ref 275–301)
Potassium: 4.4 mmol/L (ref 3.5–5.1)

## 2012-09-12 LAB — CBC WITH DIFFERENTIAL/PLATELET
Basophil #: 0 10*3/uL (ref 0.0–0.1)
Eosinophil #: 0.1 10*3/uL (ref 0.0–0.7)
Eosinophil %: 0.6 %
HCT: 28.5 % — ABNORMAL LOW (ref 35.0–47.0)
Lymphocyte #: 2.8 10*3/uL (ref 1.0–3.6)
Lymphocyte %: 16.3 %
MCHC: 33.2 g/dL (ref 32.0–36.0)
MCV: 84 fL (ref 80–100)
Monocyte #: 1 x10 3/mm — ABNORMAL HIGH (ref 0.2–0.9)
RBC: 3.4 10*6/uL — ABNORMAL LOW (ref 3.80–5.20)
RDW: 17 % — ABNORMAL HIGH (ref 11.5–14.5)

## 2012-09-12 LAB — PROTIME-INR: Prothrombin Time: 23.4 secs — ABNORMAL HIGH (ref 11.5–14.7)

## 2012-09-13 LAB — CBC WITH DIFFERENTIAL/PLATELET
Basophil #: 0.1 10*3/uL (ref 0.0–0.1)
Basophil %: 0.4 %
Eosinophil #: 0.2 10*3/uL (ref 0.0–0.7)
Eosinophil %: 1.4 %
HCT: 33.6 % — ABNORMAL LOW (ref 35.0–47.0)
HGB: 11 g/dL — ABNORMAL LOW (ref 12.0–16.0)
Lymphocyte #: 1.7 10*3/uL (ref 1.0–3.6)
Lymphocyte %: 11.1 %
MCH: 27.3 pg (ref 26.0–34.0)
MCHC: 32.8 g/dL (ref 32.0–36.0)
MCV: 83 fL (ref 80–100)
Monocyte #: 0.7 x10 3/mm (ref 0.2–0.9)
Monocyte %: 4.7 %
Neutrophil #: 12.7 10*3/uL — ABNORMAL HIGH (ref 1.4–6.5)
Neutrophil %: 82.4 %
Platelet: 355 10*3/uL (ref 150–440)
RBC: 4.03 10*6/uL (ref 3.80–5.20)
RDW: 16.7 % — ABNORMAL HIGH (ref 11.5–14.5)
WBC: 15.4 10*3/uL — ABNORMAL HIGH (ref 3.6–11.0)

## 2012-09-13 LAB — PROTIME-INR
INR: 2.1
Prothrombin Time: 23.4 secs — ABNORMAL HIGH (ref 11.5–14.7)

## 2012-09-13 LAB — CREATININE, SERUM: EGFR (Non-African Amer.): 34 — ABNORMAL LOW

## 2012-09-13 LAB — CULTURE, BLOOD (SINGLE)

## 2012-09-14 LAB — COMPREHENSIVE METABOLIC PANEL
Albumin: 1.7 g/dL — ABNORMAL LOW (ref 3.4–5.0)
Anion Gap: 7 (ref 7–16)
BUN: 31 mg/dL — ABNORMAL HIGH (ref 7–18)
Bilirubin,Total: 0.2 mg/dL (ref 0.2–1.0)
Calcium, Total: 8.4 mg/dL — ABNORMAL LOW (ref 8.5–10.1)
Co2: 29 mmol/L (ref 21–32)
EGFR (African American): 49 — ABNORMAL LOW
EGFR (Non-African Amer.): 42 — ABNORMAL LOW
Osmolality: 283 (ref 275–301)
Sodium: 137 mmol/L (ref 136–145)
Total Protein: 6.3 g/dL — ABNORMAL LOW (ref 6.4–8.2)

## 2012-09-14 LAB — CBC WITH DIFFERENTIAL/PLATELET
Basophil #: 0.1 10*3/uL (ref 0.0–0.1)
Basophil %: 0.5 %
Eosinophil #: 0.3 10*3/uL (ref 0.0–0.7)
Eosinophil %: 2.6 %
HGB: 10.8 g/dL — ABNORMAL LOW (ref 12.0–16.0)
Lymphocyte #: 1.6 10*3/uL (ref 1.0–3.6)
Lymphocyte %: 16.1 %
MCH: 27.2 pg (ref 26.0–34.0)
MCV: 84 fL (ref 80–100)
Monocyte #: 0.7 x10 3/mm (ref 0.2–0.9)
Platelet: 417 10*3/uL (ref 150–440)
RDW: 16.8 % — ABNORMAL HIGH (ref 11.5–14.5)

## 2012-09-14 LAB — CULTURE, BLOOD (SINGLE)

## 2012-09-15 LAB — CBC WITH DIFFERENTIAL/PLATELET
Basophil #: 0.1 10*3/uL (ref 0.0–0.1)
Basophil %: 0.7 %
Eosinophil #: 0.3 10*3/uL (ref 0.0–0.7)
HCT: 31.7 % — ABNORMAL LOW (ref 35.0–47.0)
HGB: 10.4 g/dL — ABNORMAL LOW (ref 12.0–16.0)
Lymphocyte %: 21 %
MCH: 27.4 pg (ref 26.0–34.0)
MCHC: 32.8 g/dL (ref 32.0–36.0)
Monocyte #: 0.9 x10 3/mm (ref 0.2–0.9)
Monocyte %: 9 %
Neutrophil #: 6.4 10*3/uL (ref 1.4–6.5)
Platelet: 455 10*3/uL — ABNORMAL HIGH (ref 150–440)
RBC: 3.79 10*6/uL — ABNORMAL LOW (ref 3.80–5.20)
WBC: 9.8 10*3/uL (ref 3.6–11.0)

## 2012-09-15 LAB — PROTIME-INR
INR: 2.3
Prothrombin Time: 25.1 secs — ABNORMAL HIGH (ref 11.5–14.7)

## 2012-09-16 LAB — WOUND CULTURE

## 2012-09-19 LAB — PROTIME-INR: Prothrombin Time: 23.7 secs — ABNORMAL HIGH (ref 11.5–14.7)

## 2012-09-20 LAB — CLOSTRIDIUM DIFFICILE BY PCR

## 2012-10-06 ENCOUNTER — Ambulatory Visit: Payer: Self-pay | Admitting: Internal Medicine

## 2012-10-06 ENCOUNTER — Ambulatory Visit: Payer: Self-pay | Admitting: Gynecologic Oncology

## 2012-10-14 DIAGNOSIS — B373 Candidiasis of vulva and vagina: Secondary | ICD-10-CM | POA: Insufficient documentation

## 2012-10-17 LAB — CREATININE, SERUM
Creatinine: 1.4 mg/dL — ABNORMAL HIGH (ref 0.60–1.30)
EGFR (African American): 42 — ABNORMAL LOW
EGFR (Non-African Amer.): 37 — ABNORMAL LOW

## 2012-10-17 LAB — PROTIME-INR
INR: 2.1
Prothrombin Time: 23.5 secs — ABNORMAL HIGH (ref 11.5–14.7)

## 2012-10-19 LAB — CA 125: CA 125: 315.7 U/mL — ABNORMAL HIGH (ref 0.0–34.0)

## 2012-10-27 ENCOUNTER — Emergency Department: Payer: Self-pay | Admitting: Emergency Medicine

## 2012-10-27 LAB — CBC WITH DIFFERENTIAL/PLATELET
Basophil %: 0.3 %
Eosinophil %: 0.5 %
HCT: 34.9 % — ABNORMAL LOW (ref 35.0–47.0)
HGB: 11.2 g/dL — ABNORMAL LOW (ref 12.0–16.0)
MCH: 27 pg (ref 26.0–34.0)
MCV: 84 fL (ref 80–100)
Monocyte #: 0.9 x10 3/mm (ref 0.2–0.9)
Monocyte %: 6.8 %
Neutrophil %: 78.4 %
Platelet: 307 10*3/uL (ref 150–440)
RBC: 4.16 10*6/uL (ref 3.80–5.20)
RDW: 17 % — ABNORMAL HIGH (ref 11.5–14.5)
WBC: 13.9 10*3/uL — ABNORMAL HIGH (ref 3.6–11.0)

## 2012-10-27 LAB — URINALYSIS, COMPLETE
Glucose,UR: NEGATIVE mg/dL (ref 0–75)
Ketone: NEGATIVE
Nitrite: POSITIVE
Ph: 5 (ref 4.5–8.0)
RBC,UR: 170 /HPF (ref 0–5)
Squamous Epithelial: NONE SEEN
WBC UR: 5386 /HPF (ref 0–5)

## 2012-10-27 LAB — BASIC METABOLIC PANEL
Calcium, Total: 8.7 mg/dL (ref 8.5–10.1)
Chloride: 104 mmol/L (ref 98–107)
Creatinine: 1.41 mg/dL — ABNORMAL HIGH (ref 0.60–1.30)
EGFR (African American): 42 — ABNORMAL LOW
Glucose: 156 mg/dL — ABNORMAL HIGH (ref 65–99)
Osmolality: 276 (ref 275–301)
Potassium: 4.3 mmol/L (ref 3.5–5.1)
Sodium: 135 mmol/L — ABNORMAL LOW (ref 136–145)

## 2012-10-29 LAB — URINE CULTURE

## 2012-11-01 ENCOUNTER — Emergency Department: Payer: Self-pay | Admitting: Emergency Medicine

## 2012-11-06 ENCOUNTER — Ambulatory Visit: Payer: Self-pay | Admitting: Internal Medicine

## 2012-11-06 ENCOUNTER — Ambulatory Visit: Payer: Self-pay | Admitting: Gynecologic Oncology

## 2012-11-14 LAB — CBC CANCER CENTER
Eosinophil %: 2.2 %
HGB: 11.3 g/dL — ABNORMAL LOW (ref 12.0–16.0)
MCH: 28.4 pg (ref 26.0–34.0)
MCV: 85 fL (ref 80–100)
Monocyte #: 0.7 x10 3/mm (ref 0.2–0.9)
Monocyte %: 8.6 %
Neutrophil %: 54.3 %
RDW: 17.2 % — ABNORMAL HIGH (ref 11.5–14.5)
WBC: 8.6 x10 3/mm (ref 3.6–11.0)

## 2012-11-14 LAB — CREATININE, SERUM: EGFR (Non-African Amer.): 35 — ABNORMAL LOW

## 2012-11-14 LAB — PROTIME-INR
INR: 1.3
Prothrombin Time: 16.1 secs — ABNORMAL HIGH (ref 11.5–14.7)

## 2012-11-15 LAB — CA 125: CA 125: 380.2 U/mL — ABNORMAL HIGH (ref 0.0–34.0)

## 2012-11-19 ENCOUNTER — Emergency Department: Payer: Self-pay | Admitting: Emergency Medicine

## 2012-11-21 LAB — PROTIME-INR: INR: 2

## 2012-12-06 ENCOUNTER — Ambulatory Visit: Payer: Self-pay | Admitting: Gynecologic Oncology

## 2012-12-06 ENCOUNTER — Ambulatory Visit: Payer: Self-pay | Admitting: Internal Medicine

## 2012-12-12 ENCOUNTER — Ambulatory Visit: Payer: Self-pay | Admitting: Family Medicine

## 2012-12-16 LAB — CBC CANCER CENTER
Basophil #: 0.1 x10 3/mm (ref 0.0–0.1)
Basophil %: 0.7 %
Eosinophil #: 0.2 x10 3/mm (ref 0.0–0.7)
HCT: 33.9 % — ABNORMAL LOW (ref 35.0–47.0)
HGB: 11.1 g/dL — ABNORMAL LOW (ref 12.0–16.0)
Lymphocyte #: 3 x10 3/mm (ref 1.0–3.6)
Lymphocyte %: 27.5 %
MCH: 27.7 pg (ref 26.0–34.0)
MCV: 85 fL (ref 80–100)
Monocyte %: 8.9 %
Neutrophil #: 6.6 x10 3/mm — ABNORMAL HIGH (ref 1.4–6.5)
Neutrophil %: 61.1 %
RBC: 4.01 10*6/uL (ref 3.80–5.20)
RDW: 16.2 % — ABNORMAL HIGH (ref 11.5–14.5)
WBC: 10.8 x10 3/mm (ref 3.6–11.0)

## 2012-12-16 LAB — CREATININE, SERUM
Creatinine: 1.77 mg/dL — ABNORMAL HIGH (ref 0.60–1.30)
EGFR (Non-African Amer.): 28 — ABNORMAL LOW

## 2012-12-16 LAB — PROTIME-INR: Prothrombin Time: 27.5 secs — ABNORMAL HIGH (ref 11.5–14.7)

## 2012-12-17 LAB — CA 125: CA 125: 426.5 U/mL — ABNORMAL HIGH (ref 0.0–34.0)

## 2012-12-21 LAB — URINALYSIS, COMPLETE
Bilirubin,UR: NEGATIVE
Glucose,UR: NEGATIVE mg/dL (ref 0–75)
Ketone: NEGATIVE
Nitrite: NEGATIVE
Ph: 5 (ref 4.5–8.0)
RBC,UR: 95 /HPF (ref 0–5)
Specific Gravity: 1.01 (ref 1.003–1.030)
Squamous Epithelial: 18
WBC UR: 24465 /HPF (ref 0–5)

## 2012-12-21 LAB — PROTIME-INR: INR: 3.2

## 2012-12-21 LAB — CREATININE, SERUM: EGFR (Non-African Amer.): 31 — ABNORMAL LOW

## 2012-12-23 LAB — URINE CULTURE

## 2012-12-26 LAB — CREATININE, SERUM
EGFR (African American): 38 — ABNORMAL LOW
EGFR (Non-African Amer.): 33 — ABNORMAL LOW

## 2012-12-26 LAB — PROTIME-INR
INR: 1.8
Prothrombin Time: 20.6 s — ABNORMAL HIGH

## 2013-01-04 LAB — PROTIME-INR
INR: 1.8
Prothrombin Time: 20.9 secs — ABNORMAL HIGH (ref 11.5–14.7)

## 2013-01-06 ENCOUNTER — Ambulatory Visit: Payer: Self-pay | Admitting: Gynecologic Oncology

## 2013-01-06 ENCOUNTER — Ambulatory Visit: Payer: Self-pay | Admitting: Internal Medicine

## 2013-01-11 LAB — CREATININE, SERUM
Creatinine: 1.71 mg/dL — ABNORMAL HIGH (ref 0.60–1.30)
EGFR (African American): 33 — ABNORMAL LOW

## 2013-01-11 LAB — PROTIME-INR: Prothrombin Time: 24.5 secs — ABNORMAL HIGH (ref 11.5–14.7)

## 2013-01-12 LAB — CA 125: CA 125: 407.3 U/mL — ABNORMAL HIGH (ref 0.0–34.0)

## 2013-02-01 LAB — CREATININE, SERUM
Creatinine: 1.68 mg/dL — ABNORMAL HIGH (ref 0.60–1.30)
EGFR (African American): 34 — ABNORMAL LOW
EGFR (Non-African Amer.): 29 — ABNORMAL LOW

## 2013-02-01 LAB — CBC CANCER CENTER
Basophil #: 0.1 x10 3/mm (ref 0.0–0.1)
Eosinophil #: 0.2 x10 3/mm (ref 0.0–0.7)
Eosinophil %: 2.3 %
HCT: 35.4 % (ref 35.0–47.0)
HGB: 11.7 g/dL — ABNORMAL LOW (ref 12.0–16.0)
Lymphocyte %: 32.5 %
MCV: 84 fL (ref 80–100)
Monocyte %: 7.5 %
Neutrophil #: 5.5 x10 3/mm (ref 1.4–6.5)
WBC: 9.6 x10 3/mm (ref 3.6–11.0)

## 2013-02-01 LAB — PROTIME-INR
INR: 1.6
Prothrombin Time: 18.6 secs — ABNORMAL HIGH (ref 11.5–14.7)

## 2013-02-02 LAB — CA 125: CA 125: 474.9 U/mL — ABNORMAL HIGH (ref 0.0–34.0)

## 2013-02-06 ENCOUNTER — Ambulatory Visit: Payer: Self-pay | Admitting: Gynecologic Oncology

## 2013-02-06 ENCOUNTER — Ambulatory Visit: Payer: Self-pay | Admitting: Internal Medicine

## 2013-02-08 LAB — PROTIME-INR: Prothrombin Time: 17.1 secs — ABNORMAL HIGH (ref 11.5–14.7)

## 2013-02-22 LAB — CREATININE, SERUM: EGFR (Non-African Amer.): 29 — ABNORMAL LOW

## 2013-02-22 LAB — PROTIME-INR
INR: 1.4
Prothrombin Time: 17.1 secs — ABNORMAL HIGH (ref 11.5–14.7)

## 2013-02-23 LAB — CA 125: CA 125: 689.6 U/mL — ABNORMAL HIGH (ref 0.0–34.0)

## 2013-03-08 ENCOUNTER — Ambulatory Visit: Payer: Self-pay | Admitting: Gynecologic Oncology

## 2013-03-08 ENCOUNTER — Ambulatory Visit: Payer: Self-pay | Admitting: Internal Medicine

## 2013-03-08 LAB — HEPATIC FUNCTION PANEL A (ARMC)
Albumin: 2.8 g/dL — ABNORMAL LOW (ref 3.4–5.0)
Alkaline Phosphatase: 79 U/L (ref 50–136)
Bilirubin, Direct: 0.1 mg/dL (ref 0.00–0.20)
Bilirubin,Total: 0.2 mg/dL (ref 0.2–1.0)
SGOT(AST): 16 U/L (ref 15–37)
SGPT (ALT): 16 U/L (ref 12–78)
Total Protein: 7.2 g/dL (ref 6.4–8.2)

## 2013-03-08 LAB — MAGNESIUM: Magnesium: 1.8 mg/dL

## 2013-03-08 LAB — PROTIME-INR: Prothrombin Time: 20.4 secs — ABNORMAL HIGH (ref 11.5–14.7)

## 2013-03-13 LAB — PROTIME-INR: Prothrombin Time: 21.5 secs — ABNORMAL HIGH (ref 11.5–14.7)

## 2013-03-13 LAB — CBC CANCER CENTER
Basophil %: 0.7 %
Eosinophil %: 2.2 %
HGB: 11.5 g/dL — ABNORMAL LOW (ref 12.0–16.0)
Lymphocyte %: 38.7 %
MCH: 28.2 pg (ref 26.0–34.0)
MCHC: 33.2 g/dL (ref 32.0–36.0)
Monocyte #: 0.9 x10 3/mm (ref 0.2–0.9)
Neutrophil #: 4.9 x10 3/mm (ref 1.4–6.5)
Neutrophil %: 49.5 %
RBC: 4.09 10*6/uL (ref 3.80–5.20)
RDW: 16.6 % — ABNORMAL HIGH (ref 11.5–14.5)

## 2013-03-13 LAB — CREATININE, SERUM
EGFR (African American): 39 — ABNORMAL LOW
EGFR (Non-African Amer.): 34 — ABNORMAL LOW

## 2013-03-27 LAB — PROTIME-INR
INR: 2.1
Prothrombin Time: 23.4 secs — ABNORMAL HIGH (ref 11.5–14.7)

## 2013-03-28 LAB — CA 125: CA 125: 714.7 U/mL — ABNORMAL HIGH (ref 0.0–34.0)

## 2013-04-08 ENCOUNTER — Ambulatory Visit: Payer: Self-pay | Admitting: Gynecologic Oncology

## 2013-04-08 ENCOUNTER — Ambulatory Visit: Payer: Self-pay | Admitting: Internal Medicine

## 2013-04-27 LAB — CBC CANCER CENTER
Basophil #: 0.1 x10 3/mm (ref 0.0–0.1)
Basophil %: 0.9 %
Eosinophil #: 0.2 x10 3/mm (ref 0.0–0.7)
Eosinophil %: 2.6 %
HCT: 36.5 % (ref 35.0–47.0)
HGB: 11.8 g/dL — ABNORMAL LOW (ref 12.0–16.0)
Lymphocyte #: 3.2 x10 3/mm (ref 1.0–3.6)
MCH: 27.9 pg (ref 26.0–34.0)
MCHC: 32.2 g/dL (ref 32.0–36.0)
Monocyte %: 8.1 %
Neutrophil %: 54.8 %
RBC: 4.21 10*6/uL (ref 3.80–5.20)

## 2013-04-27 LAB — CREATININE, SERUM
Creatinine: 1.42 mg/dL — ABNORMAL HIGH (ref 0.60–1.30)
EGFR (African American): 42 — ABNORMAL LOW

## 2013-05-08 ENCOUNTER — Ambulatory Visit: Payer: Self-pay | Admitting: Gynecologic Oncology

## 2013-05-08 ENCOUNTER — Ambulatory Visit: Payer: Self-pay | Admitting: Internal Medicine

## 2013-05-25 LAB — CBC CANCER CENTER
Basophil #: 0.1 x10 3/mm (ref 0.0–0.1)
Eosinophil #: 0.2 x10 3/mm (ref 0.0–0.7)
Eosinophil %: 2 %
HGB: 11.5 g/dL — ABNORMAL LOW (ref 12.0–16.0)
Lymphocyte %: 34 %
MCH: 28 pg (ref 26.0–34.0)
Monocyte %: 7.3 %
Neutrophil #: 5.7 x10 3/mm (ref 1.4–6.5)
Neutrophil %: 56 %
RBC: 4.11 10*6/uL (ref 3.80–5.20)
WBC: 10.2 x10 3/mm (ref 3.6–11.0)

## 2013-05-25 LAB — HEPATIC FUNCTION PANEL A (ARMC)
Alkaline Phosphatase: 61 U/L
Bilirubin, Direct: <0.1 mg/dL (ref 0.00–0.20)

## 2013-05-25 LAB — PROTIME-INR: Prothrombin Time: 21.9 secs — ABNORMAL HIGH (ref 11.5–14.7)

## 2013-05-25 LAB — CREATININE, SERUM
Creatinine: 1.6 mg/dL — ABNORMAL HIGH (ref 0.60–1.30)
EGFR (African American): 36 — ABNORMAL LOW
EGFR (Non-African Amer.): 31 — ABNORMAL LOW

## 2013-05-26 LAB — CA 125: CA 125: 857.9 U/mL — ABNORMAL HIGH (ref 0.0–34.0)

## 2013-06-08 ENCOUNTER — Ambulatory Visit: Payer: Self-pay | Admitting: Gynecologic Oncology

## 2013-06-08 ENCOUNTER — Ambulatory Visit: Payer: Self-pay | Admitting: Internal Medicine

## 2013-07-06 LAB — CBC CANCER CENTER
Basophil #: 0.1 x10 3/mm (ref 0.0–0.1)
Basophil %: 0.5 %
EOS PCT: 1.8 %
Eosinophil #: 0.2 x10 3/mm (ref 0.0–0.7)
HCT: 36.3 % (ref 35.0–47.0)
HGB: 11.4 g/dL — ABNORMAL LOW (ref 12.0–16.0)
LYMPHS ABS: 3.1 x10 3/mm (ref 1.0–3.6)
LYMPHS PCT: 26 %
MCH: 27.1 pg (ref 26.0–34.0)
MCHC: 31.5 g/dL — ABNORMAL LOW (ref 32.0–36.0)
MCV: 86 fL (ref 80–100)
MONOS PCT: 7.9 %
Monocyte #: 0.9 x10 3/mm (ref 0.2–0.9)
Neutrophil #: 7.5 x10 3/mm — ABNORMAL HIGH (ref 1.4–6.5)
Neutrophil %: 63.8 %
Platelet: 355 x10 3/mm (ref 150–440)
RBC: 4.22 10*6/uL (ref 3.80–5.20)
RDW: 15.8 % — ABNORMAL HIGH (ref 11.5–14.5)
WBC: 11.8 x10 3/mm — ABNORMAL HIGH (ref 3.6–11.0)

## 2013-07-06 LAB — COMPREHENSIVE METABOLIC PANEL
ALBUMIN: 2.7 g/dL — AB (ref 3.4–5.0)
ALK PHOS: 63 U/L
ALT: 12 U/L (ref 12–78)
ANION GAP: 13 (ref 7–16)
BUN: 31 mg/dL — ABNORMAL HIGH (ref 7–18)
Bilirubin,Total: 0.1 mg/dL — ABNORMAL LOW (ref 0.2–1.0)
Calcium, Total: 8 mg/dL — ABNORMAL LOW (ref 8.5–10.1)
Chloride: 105 mmol/L (ref 98–107)
Co2: 20 mmol/L — ABNORMAL LOW (ref 21–32)
Creatinine: 1.68 mg/dL — ABNORMAL HIGH (ref 0.60–1.30)
GFR CALC AF AMER: 34 — AB
GFR CALC NON AF AMER: 29 — AB
Glucose: 166 mg/dL — ABNORMAL HIGH (ref 65–99)
OSMOLALITY: 286 (ref 275–301)
Potassium: 4.2 mmol/L (ref 3.5–5.1)
SGOT(AST): 13 U/L — ABNORMAL LOW (ref 15–37)
Sodium: 138 mmol/L (ref 136–145)
Total Protein: 7.3 g/dL (ref 6.4–8.2)

## 2013-07-06 LAB — MAGNESIUM: MAGNESIUM: 1.7 mg/dL — AB

## 2013-07-06 LAB — PROTIME-INR
INR: 1.8
Prothrombin Time: 20.6 secs — ABNORMAL HIGH (ref 11.5–14.7)

## 2013-07-07 LAB — CA 125: CA 125: 1032 U/mL — ABNORMAL HIGH (ref 0.0–34.0)

## 2013-07-09 ENCOUNTER — Ambulatory Visit: Payer: Self-pay | Admitting: Gynecologic Oncology

## 2013-07-09 ENCOUNTER — Ambulatory Visit: Payer: Self-pay | Admitting: Internal Medicine

## 2013-07-10 DIAGNOSIS — Z0289 Encounter for other administrative examinations: Secondary | ICD-10-CM | POA: Insufficient documentation

## 2013-07-14 ENCOUNTER — Inpatient Hospital Stay: Payer: Self-pay | Admitting: Internal Medicine

## 2013-07-14 LAB — CBC WITH DIFFERENTIAL/PLATELET
BASOS ABS: 0.1 10*3/uL (ref 0.0–0.1)
BASOS PCT: 0.5 %
Eosinophil #: 0.2 10*3/uL (ref 0.0–0.7)
Eosinophil %: 2 %
HCT: 35.3 % (ref 35.0–47.0)
HGB: 11.6 g/dL — ABNORMAL LOW (ref 12.0–16.0)
Lymphocyte #: 1.4 10*3/uL (ref 1.0–3.6)
Lymphocyte %: 11.4 %
MCH: 28.3 pg (ref 26.0–34.0)
MCHC: 32.8 g/dL (ref 32.0–36.0)
MCV: 86 fL (ref 80–100)
Monocyte #: 0.7 x10 3/mm (ref 0.2–0.9)
Monocyte %: 6.1 %
NEUTROS ABS: 9.6 10*3/uL — AB (ref 1.4–6.5)
Neutrophil %: 80 %
Platelet: 310 10*3/uL (ref 150–440)
RBC: 4.09 10*6/uL (ref 3.80–5.20)
RDW: 15.9 % — ABNORMAL HIGH (ref 11.5–14.5)
WBC: 12 10*3/uL — AB (ref 3.6–11.0)

## 2013-07-14 LAB — COMPREHENSIVE METABOLIC PANEL
ALT: 13 U/L (ref 12–78)
ANION GAP: 4 — AB (ref 7–16)
Albumin: 2.7 g/dL — ABNORMAL LOW (ref 3.4–5.0)
Alkaline Phosphatase: 62 U/L
BUN: 33 mg/dL — AB (ref 7–18)
Bilirubin,Total: 0.2 mg/dL (ref 0.2–1.0)
CO2: 27 mmol/L (ref 21–32)
Calcium, Total: 8.9 mg/dL (ref 8.5–10.1)
Chloride: 106 mmol/L (ref 98–107)
Creatinine: 1.81 mg/dL — ABNORMAL HIGH (ref 0.60–1.30)
EGFR (African American): 31 — ABNORMAL LOW
EGFR (Non-African Amer.): 27 — ABNORMAL LOW
Glucose: 87 mg/dL (ref 65–99)
Osmolality: 280 (ref 275–301)
Potassium: 4.3 mmol/L (ref 3.5–5.1)
SGOT(AST): 19 U/L (ref 15–37)
Sodium: 137 mmol/L (ref 136–145)
TOTAL PROTEIN: 7.4 g/dL (ref 6.4–8.2)

## 2013-07-14 LAB — URINALYSIS, COMPLETE
BILIRUBIN, UR: NEGATIVE
BLOOD: NEGATIVE
Glucose,UR: NEGATIVE mg/dL (ref 0–75)
Ketone: NEGATIVE
NITRITE: NEGATIVE
Ph: 5 (ref 4.5–8.0)
SPECIFIC GRAVITY: 1.013 (ref 1.003–1.030)
WBC UR: >30 /HPF (ref 0–5)

## 2013-07-14 LAB — LIPASE, BLOOD: Lipase: 114 U/L (ref 73–393)

## 2013-07-14 LAB — CLOSTRIDIUM DIFFICILE(ARMC)

## 2013-07-14 LAB — PROTIME-INR
INR: 2.2
PROTHROMBIN TIME: 23.8 s — AB (ref 11.5–14.7)

## 2013-07-15 LAB — CBC WITH DIFFERENTIAL/PLATELET
BASOS ABS: 0 10*3/uL (ref 0.0–0.1)
BASOS PCT: 0.3 %
EOS PCT: 1.1 %
Eosinophil #: 0.1 10*3/uL (ref 0.0–0.7)
HCT: 31.1 % — AB (ref 35.0–47.0)
HGB: 10.3 g/dL — AB (ref 12.0–16.0)
LYMPHS ABS: 1.2 10*3/uL (ref 1.0–3.6)
Lymphocyte %: 14.1 %
MCH: 28.8 pg (ref 26.0–34.0)
MCHC: 33.2 g/dL (ref 32.0–36.0)
MCV: 87 fL (ref 80–100)
MONOS PCT: 6.7 %
Monocyte #: 0.6 x10 3/mm (ref 0.2–0.9)
Neutrophil #: 6.6 10*3/uL — ABNORMAL HIGH (ref 1.4–6.5)
Neutrophil %: 77.8 %
Platelet: 238 10*3/uL (ref 150–440)
RBC: 3.59 10*6/uL — ABNORMAL LOW (ref 3.80–5.20)
RDW: 16.1 % — ABNORMAL HIGH (ref 11.5–14.5)
WBC: 8.4 10*3/uL (ref 3.6–11.0)

## 2013-07-15 LAB — BASIC METABOLIC PANEL
ANION GAP: 7 (ref 7–16)
BUN: 24 mg/dL — ABNORMAL HIGH (ref 7–18)
CHLORIDE: 110 mmol/L — AB (ref 98–107)
CO2: 21 mmol/L (ref 21–32)
CREATININE: 1.62 mg/dL — AB (ref 0.60–1.30)
Calcium, Total: 7.8 mg/dL — ABNORMAL LOW (ref 8.5–10.1)
EGFR (African American): 35 — ABNORMAL LOW
GFR CALC NON AF AMER: 31 — AB
GLUCOSE: 146 mg/dL — AB (ref 65–99)
Osmolality: 282 (ref 275–301)
Potassium: 4.1 mmol/L (ref 3.5–5.1)
Sodium: 138 mmol/L (ref 136–145)

## 2013-07-16 LAB — BASIC METABOLIC PANEL
Anion Gap: 4 — ABNORMAL LOW (ref 7–16)
BUN: 16 mg/dL (ref 7–18)
CHLORIDE: 113 mmol/L — AB (ref 98–107)
CO2: 23 mmol/L (ref 21–32)
CREATININE: 1.36 mg/dL — AB (ref 0.60–1.30)
Calcium, Total: 7.8 mg/dL — ABNORMAL LOW (ref 8.5–10.1)
EGFR (African American): 44 — ABNORMAL LOW
GFR CALC NON AF AMER: 38 — AB
Glucose: 210 mg/dL — ABNORMAL HIGH (ref 65–99)
OSMOLALITY: 287 (ref 275–301)
POTASSIUM: 4.4 mmol/L (ref 3.5–5.1)
Sodium: 140 mmol/L (ref 136–145)

## 2013-07-16 LAB — PROTIME-INR
INR: 2.8
PROTHROMBIN TIME: 28.7 s — AB (ref 11.5–14.7)

## 2013-07-17 LAB — CBC WITH DIFFERENTIAL/PLATELET
Basophil #: 0.1 10*3/uL (ref 0.0–0.1)
Basophil %: 0.7 %
EOS PCT: 1.3 %
Eosinophil #: 0.1 10*3/uL (ref 0.0–0.7)
HCT: 35.3 % (ref 35.0–47.0)
HGB: 11.6 g/dL — AB (ref 12.0–16.0)
LYMPHS ABS: 2 10*3/uL (ref 1.0–3.6)
Lymphocyte %: 20.9 %
MCH: 28.4 pg (ref 26.0–34.0)
MCHC: 32.9 g/dL (ref 32.0–36.0)
MCV: 86 fL (ref 80–100)
MONO ABS: 0.8 x10 3/mm (ref 0.2–0.9)
Monocyte %: 8.1 %
NEUTROS ABS: 6.5 10*3/uL (ref 1.4–6.5)
NEUTROS PCT: 69 %
Platelet: 262 10*3/uL (ref 150–440)
RBC: 4.09 10*6/uL (ref 3.80–5.20)
RDW: 15.9 % — ABNORMAL HIGH (ref 11.5–14.5)
WBC: 9.4 10*3/uL (ref 3.6–11.0)

## 2013-07-17 LAB — PROTIME-INR
INR: 2.8
PROTHROMBIN TIME: 28.9 s — AB (ref 11.5–14.7)

## 2013-07-17 LAB — PRO B NATRIURETIC PEPTIDE: B-Type Natriuretic Peptide: 1700 pg/mL — ABNORMAL HIGH (ref 0–450)

## 2013-07-17 LAB — MAGNESIUM: Magnesium: 1.4 mg/dL — ABNORMAL LOW

## 2013-07-18 LAB — BASIC METABOLIC PANEL
ANION GAP: 5 — AB (ref 7–16)
BUN: 19 mg/dL — ABNORMAL HIGH (ref 7–18)
CALCIUM: 8.9 mg/dL (ref 8.5–10.1)
CHLORIDE: 107 mmol/L (ref 98–107)
CO2: 27 mmol/L (ref 21–32)
CREATININE: 1.51 mg/dL — AB (ref 0.60–1.30)
EGFR (African American): 39 — ABNORMAL LOW
GFR CALC NON AF AMER: 33 — AB
Glucose: 237 mg/dL — ABNORMAL HIGH (ref 65–99)
Osmolality: 287 (ref 275–301)
Potassium: 4.3 mmol/L (ref 3.5–5.1)
Sodium: 139 mmol/L (ref 136–145)

## 2013-07-18 LAB — PROTIME-INR
INR: 3.1
Prothrombin Time: 30.7 secs — ABNORMAL HIGH (ref 11.5–14.7)

## 2013-07-19 LAB — PROTIME-INR
INR: 3.4
Prothrombin Time: 33 secs — ABNORMAL HIGH (ref 11.5–14.7)

## 2013-07-20 LAB — URINE CULTURE

## 2013-07-28 LAB — CBC CANCER CENTER
BASOS ABS: 0.1 x10 3/mm (ref 0.0–0.1)
BASOS PCT: 0.8 %
Eosinophil #: 0.3 x10 3/mm (ref 0.0–0.7)
Eosinophil %: 2.7 %
HCT: 32.5 % — ABNORMAL LOW (ref 35.0–47.0)
HGB: 10.4 g/dL — ABNORMAL LOW (ref 12.0–16.0)
LYMPHS ABS: 2.4 x10 3/mm (ref 1.0–3.6)
Lymphocyte %: 21.9 %
MCH: 27.5 pg (ref 26.0–34.0)
MCHC: 32 g/dL (ref 32.0–36.0)
MCV: 86 fL (ref 80–100)
MONO ABS: 0.8 x10 3/mm (ref 0.2–0.9)
Monocyte %: 7.5 %
Neutrophil #: 7.3 x10 3/mm — ABNORMAL HIGH (ref 1.4–6.5)
Neutrophil %: 67.1 %
Platelet: 438 x10 3/mm (ref 150–440)
RBC: 3.79 10*6/uL — ABNORMAL LOW (ref 3.80–5.20)
RDW: 15.6 % — AB (ref 11.5–14.5)
WBC: 10.9 x10 3/mm (ref 3.6–11.0)

## 2013-07-28 LAB — BASIC METABOLIC PANEL
ANION GAP: 12 (ref 7–16)
BUN: 21 mg/dL — AB (ref 7–18)
CREATININE: 1.33 mg/dL — AB (ref 0.60–1.30)
Calcium, Total: 7.9 mg/dL — ABNORMAL LOW (ref 8.5–10.1)
Chloride: 104 mmol/L (ref 98–107)
Co2: 23 mmol/L (ref 21–32)
EGFR (African American): 45 — ABNORMAL LOW
GFR CALC NON AF AMER: 39 — AB
Glucose: 155 mg/dL — ABNORMAL HIGH (ref 65–99)
OSMOLALITY: 284 (ref 275–301)
POTASSIUM: 4.9 mmol/L (ref 3.5–5.1)
SODIUM: 139 mmol/L (ref 136–145)

## 2013-07-28 LAB — MAGNESIUM: MAGNESIUM: 1.6 mg/dL — AB

## 2013-07-28 LAB — PROTIME-INR
INR: 1.9
PROTHROMBIN TIME: 21.1 s — AB (ref 11.5–14.7)

## 2013-08-06 ENCOUNTER — Ambulatory Visit: Payer: Self-pay | Admitting: Internal Medicine

## 2013-08-10 LAB — PROTIME-INR
INR: 2.1
Prothrombin Time: 23.4 secs — ABNORMAL HIGH (ref 11.5–14.7)

## 2013-08-10 LAB — CANCER CENTER HEMOGLOBIN: HGB: 10.4 g/dL — ABNORMAL LOW (ref 12.0–16.0)

## 2013-08-10 LAB — CREATININE, SERUM
Creatinine: 1.53 mg/dL — ABNORMAL HIGH (ref 0.60–1.30)
EGFR (African American): 38 — ABNORMAL LOW
GFR CALC NON AF AMER: 33 — AB

## 2013-08-11 LAB — CA 125: CA 125: 1190 U/mL — ABNORMAL HIGH (ref 0.0–34.0)

## 2013-09-06 ENCOUNTER — Ambulatory Visit: Payer: Self-pay | Admitting: Internal Medicine

## 2013-09-14 LAB — CBC CANCER CENTER
BASOS ABS: 0.1 x10 3/mm (ref 0.0–0.1)
Basophil %: 0.7 %
EOS PCT: 2.8 %
Eosinophil #: 0.3 x10 3/mm (ref 0.0–0.7)
HCT: 33.6 % — AB (ref 35.0–47.0)
HGB: 10.5 g/dL — ABNORMAL LOW (ref 12.0–16.0)
LYMPHS ABS: 2.8 x10 3/mm (ref 1.0–3.6)
LYMPHS PCT: 26.1 %
MCH: 26.6 pg (ref 26.0–34.0)
MCHC: 31.1 g/dL — AB (ref 32.0–36.0)
MCV: 85 fL (ref 80–100)
MONO ABS: 0.9 x10 3/mm (ref 0.2–0.9)
Monocyte %: 8.3 %
NEUTROS PCT: 62.1 %
Neutrophil #: 6.6 x10 3/mm — ABNORMAL HIGH (ref 1.4–6.5)
Platelet: 368 x10 3/mm (ref 150–440)
RBC: 3.94 10*6/uL (ref 3.80–5.20)
RDW: 15.7 % — ABNORMAL HIGH (ref 11.5–14.5)
WBC: 10.6 x10 3/mm (ref 3.6–11.0)

## 2013-09-14 LAB — COMPREHENSIVE METABOLIC PANEL
ALBUMIN: 2.3 g/dL — AB (ref 3.4–5.0)
ANION GAP: 13 (ref 7–16)
Alkaline Phosphatase: 49 U/L
BILIRUBIN TOTAL: 0.2 mg/dL (ref 0.2–1.0)
BUN: 24 mg/dL — ABNORMAL HIGH (ref 7–18)
Calcium, Total: 8.8 mg/dL (ref 8.5–10.1)
Chloride: 102 mmol/L (ref 98–107)
Co2: 23 mmol/L (ref 21–32)
Creatinine: 1.57 mg/dL — ABNORMAL HIGH (ref 0.60–1.30)
EGFR (African American): 37 — ABNORMAL LOW
EGFR (Non-African Amer.): 32 — ABNORMAL LOW
Glucose: 257 mg/dL — ABNORMAL HIGH (ref 65–99)
Osmolality: 289 (ref 275–301)
Potassium: 4.8 mmol/L (ref 3.5–5.1)
SGOT(AST): 9 U/L — ABNORMAL LOW (ref 15–37)
SGPT (ALT): 10 U/L — ABNORMAL LOW (ref 12–78)
Sodium: 138 mmol/L (ref 136–145)
Total Protein: 7.2 g/dL (ref 6.4–8.2)

## 2013-09-14 LAB — PROTIME-INR
INR: 3.7
PROTHROMBIN TIME: 35.7 s — AB (ref 11.5–14.7)

## 2013-09-15 LAB — CA 125: CA 125: 1435 U/mL — ABNORMAL HIGH (ref 0.0–34.0)

## 2013-09-21 LAB — PROTIME-INR
INR: 2
PROTHROMBIN TIME: 22 s — AB (ref 11.5–14.7)

## 2013-09-25 LAB — URINALYSIS, COMPLETE
Bacteria: NEGATIVE
Bilirubin,UR: NEGATIVE
Blood: NEGATIVE
Glucose,UR: 50 mg/dL (ref 0–75)
Ketone: NEGATIVE
Nitrite: NEGATIVE
Ph: 6 (ref 4.5–8.0)
SPECIFIC GRAVITY: 1.005 (ref 1.003–1.030)

## 2013-09-25 LAB — PROTIME-INR
INR: 1.9
PROTHROMBIN TIME: 21.7 s — AB (ref 11.5–14.7)

## 2013-09-25 LAB — CREATININE, SERUM
CREATININE: 1.4 mg/dL — AB (ref 0.60–1.30)
GFR CALC AF AMER: 42 — AB
GFR CALC NON AF AMER: 36 — AB

## 2013-09-27 LAB — URINE CULTURE

## 2013-10-03 LAB — URINALYSIS, COMPLETE
Bacteria: NONE SEEN
Bilirubin,UR: NEGATIVE
Blood: NEGATIVE
KETONE: NEGATIVE
Nitrite: NEGATIVE
Ph: 7 (ref 4.5–8.0)
Specific Gravity: 1.008 (ref 1.003–1.030)
WBC UR: 156 /HPF (ref 0–5)

## 2013-10-03 LAB — PROTIME-INR
INR: 2.7
Prothrombin Time: 28.3 secs — ABNORMAL HIGH (ref 11.5–14.7)

## 2013-10-04 LAB — CA 125: CA 125: 1539 U/mL — ABNORMAL HIGH (ref 0.0–34.0)

## 2013-10-04 LAB — URINE CULTURE

## 2013-10-06 ENCOUNTER — Ambulatory Visit: Payer: Self-pay | Admitting: Internal Medicine

## 2013-10-25 LAB — CREATININE, SERUM
Creatinine: 1.51 mg/dL — ABNORMAL HIGH (ref 0.60–1.30)
EGFR (African American): 39 — ABNORMAL LOW
EGFR (Non-African Amer.): 33 — ABNORMAL LOW

## 2013-10-25 LAB — PROTIME-INR
INR: 2.3
PROTHROMBIN TIME: 24.7 s — AB (ref 11.5–14.7)

## 2013-10-25 LAB — CANCER CENTER HEMOGLOBIN: HGB: 10.9 g/dL — AB (ref 12.0–16.0)

## 2013-10-26 LAB — CA 125: CA 125: 1861 U/mL — ABNORMAL HIGH (ref 0.0–34.0)

## 2013-11-06 ENCOUNTER — Ambulatory Visit: Payer: Self-pay | Admitting: Internal Medicine

## 2013-11-21 LAB — CBC CANCER CENTER
Basophil #: 0.1 x10 3/mm (ref 0.0–0.1)
Basophil %: 0.7 %
EOS PCT: 2.4 %
Eosinophil #: 0.2 x10 3/mm (ref 0.0–0.7)
HCT: 36.1 % (ref 35.0–47.0)
HGB: 11.6 g/dL — AB (ref 12.0–16.0)
Lymphocyte #: 2.6 x10 3/mm (ref 1.0–3.6)
Lymphocyte %: 29 %
MCH: 27.8 pg (ref 26.0–34.0)
MCHC: 32.2 g/dL (ref 32.0–36.0)
MCV: 86 fL (ref 80–100)
Monocyte #: 0.8 x10 3/mm (ref 0.2–0.9)
Monocyte %: 8.5 %
NEUTROS PCT: 59.4 %
Neutrophil #: 5.4 x10 3/mm (ref 1.4–6.5)
Platelet: 328 x10 3/mm (ref 150–440)
RBC: 4.19 10*6/uL (ref 3.80–5.20)
RDW: 16.3 % — AB (ref 11.5–14.5)
WBC: 9 x10 3/mm (ref 3.6–11.0)

## 2013-11-21 LAB — PROTIME-INR
INR: 2.6
Prothrombin Time: 27.4 secs — ABNORMAL HIGH (ref 11.5–14.7)

## 2013-11-21 LAB — CREATININE, SERUM
CREATININE: 1.52 mg/dL — AB (ref 0.60–1.30)
EGFR (African American): 38 — ABNORMAL LOW
EGFR (Non-African Amer.): 33 — ABNORMAL LOW

## 2013-11-22 LAB — CA 125: CA 125: 2335 U/mL — AB (ref 0.0–34.0)

## 2013-12-06 ENCOUNTER — Ambulatory Visit: Payer: Self-pay | Admitting: Internal Medicine

## 2013-12-07 LAB — CREATININE, SERUM
CREATININE: 1.5 mg/dL — AB (ref 0.60–1.30)
EGFR (African American): 39 — ABNORMAL LOW
EGFR (Non-African Amer.): 33 — ABNORMAL LOW

## 2013-12-07 LAB — PROTIME-INR
INR: 2.1
Prothrombin Time: 23.2 secs — ABNORMAL HIGH (ref 11.5–14.7)

## 2013-12-11 LAB — CA 125: CA 125: 2365 U/mL — ABNORMAL HIGH (ref 0.0–34.0)

## 2013-12-13 ENCOUNTER — Ambulatory Visit: Payer: Self-pay | Admitting: Family Medicine

## 2013-12-20 LAB — CBC CANCER CENTER
BASOS ABS: 0.1 x10 3/mm (ref 0.0–0.1)
BASOS PCT: 0.8 %
Eosinophil #: 0.2 x10 3/mm (ref 0.0–0.7)
Eosinophil %: 2.1 %
HCT: 36.6 % (ref 35.0–47.0)
HGB: 11.8 g/dL — ABNORMAL LOW (ref 12.0–16.0)
Lymphocyte #: 2.4 x10 3/mm (ref 1.0–3.6)
Lymphocyte %: 27.4 %
MCH: 28 pg (ref 26.0–34.0)
MCHC: 32.3 g/dL (ref 32.0–36.0)
MCV: 87 fL (ref 80–100)
MONOS PCT: 7.7 %
Monocyte #: 0.7 x10 3/mm (ref 0.2–0.9)
NEUTROS ABS: 5.5 x10 3/mm (ref 1.4–6.5)
Neutrophil %: 62 %
PLATELETS: 322 x10 3/mm (ref 150–440)
RBC: 4.23 10*6/uL (ref 3.80–5.20)
RDW: 16 % — AB (ref 11.5–14.5)
WBC: 8.9 x10 3/mm (ref 3.6–11.0)

## 2013-12-20 LAB — CREATININE, SERUM
Creatinine: 1.5 mg/dL — ABNORMAL HIGH (ref 0.60–1.30)
EGFR (African American): 39 — ABNORMAL LOW
EGFR (Non-African Amer.): 33 — ABNORMAL LOW

## 2013-12-20 LAB — PROTIME-INR
INR: 2.3
PROTHROMBIN TIME: 25.1 s — AB (ref 11.5–14.7)

## 2013-12-21 LAB — CA 125: CA 125: 2750 U/mL — AB (ref 0.0–34.0)

## 2014-01-06 ENCOUNTER — Ambulatory Visit: Payer: Self-pay | Admitting: Internal Medicine

## 2014-01-08 LAB — PROTIME-INR
INR: 2.2
PROTHROMBIN TIME: 24.2 s — AB (ref 11.5–14.7)

## 2014-01-08 LAB — CREATININE, SERUM
CREATININE: 1.79 mg/dL — AB (ref 0.60–1.30)
EGFR (African American): 31 — ABNORMAL LOW
GFR CALC NON AF AMER: 27 — AB

## 2014-01-09 LAB — CA 125: CA 125: 3064 U/mL — AB (ref 0.0–34.0)

## 2014-01-22 LAB — CBC CANCER CENTER
BASOS ABS: 0.1 x10 3/mm (ref 0.0–0.1)
Basophil %: 0.6 %
Eosinophil #: 0.2 x10 3/mm (ref 0.0–0.7)
Eosinophil %: 2.8 %
HCT: 35.4 % (ref 35.0–47.0)
HGB: 11.3 g/dL — ABNORMAL LOW (ref 12.0–16.0)
Lymphocyte #: 2.1 x10 3/mm (ref 1.0–3.6)
Lymphocyte %: 24.3 %
MCH: 27.8 pg (ref 26.0–34.0)
MCHC: 32 g/dL (ref 32.0–36.0)
MCV: 87 fL (ref 80–100)
MONO ABS: 0.7 x10 3/mm (ref 0.2–0.9)
MONOS PCT: 7.9 %
NEUTROS ABS: 5.6 x10 3/mm (ref 1.4–6.5)
Neutrophil %: 64.4 %
PLATELETS: 320 x10 3/mm (ref 150–440)
RBC: 4.07 10*6/uL (ref 3.80–5.20)
RDW: 15.1 % — ABNORMAL HIGH (ref 11.5–14.5)
WBC: 8.7 x10 3/mm (ref 3.6–11.0)

## 2014-01-22 LAB — CREATININE, SERUM
Creatinine: 1.75 mg/dL — ABNORMAL HIGH (ref 0.60–1.30)
EGFR (African American): 32 — ABNORMAL LOW
GFR CALC NON AF AMER: 28 — AB

## 2014-02-05 LAB — CREATININE, SERUM
Creatinine: 1.79 mg/dL — ABNORMAL HIGH (ref 0.60–1.30)
EGFR (African American): 31 — ABNORMAL LOW
EGFR (Non-African Amer.): 27 — ABNORMAL LOW

## 2014-02-05 LAB — URIC ACID: URIC ACID: 6.7 mg/dL — AB (ref 2.6–6.0)

## 2014-02-05 LAB — PROTIME-INR
INR: 3
PROTHROMBIN TIME: 30.3 s — AB (ref 11.5–14.7)

## 2014-02-06 ENCOUNTER — Ambulatory Visit: Payer: Self-pay | Admitting: Internal Medicine

## 2014-02-06 LAB — CA 125: CA 125: 3271 U/mL — AB (ref 0.0–34.0)

## 2014-02-13 LAB — PROTIME-INR
INR: 1.9
PROTHROMBIN TIME: 21.3 s — AB (ref 11.5–14.7)

## 2014-02-16 ENCOUNTER — Other Ambulatory Visit (HOSPITAL_COMMUNITY): Payer: Self-pay | Admitting: Internal Medicine

## 2014-02-27 ENCOUNTER — Ambulatory Visit: Payer: Self-pay | Admitting: Family Medicine

## 2014-03-05 LAB — CBC CANCER CENTER
BASOS ABS: 0.1 x10 3/mm (ref 0.0–0.1)
BASOS PCT: 0.6 %
Eosinophil #: 0.3 x10 3/mm (ref 0.0–0.7)
Eosinophil %: 3 %
HCT: 31.4 % — ABNORMAL LOW (ref 35.0–47.0)
HGB: 10.1 g/dL — AB (ref 12.0–16.0)
Lymphocyte #: 2.4 x10 3/mm (ref 1.0–3.6)
Lymphocyte %: 23.5 %
MCH: 28.2 pg (ref 26.0–34.0)
MCHC: 32.2 g/dL (ref 32.0–36.0)
MCV: 88 fL (ref 80–100)
Monocyte #: 1 x10 3/mm — ABNORMAL HIGH (ref 0.2–0.9)
Monocyte %: 9.4 %
NEUTROS PCT: 63.5 %
Neutrophil #: 6.4 x10 3/mm (ref 1.4–6.5)
PLATELETS: 346 x10 3/mm (ref 150–440)
RBC: 3.58 10*6/uL — ABNORMAL LOW (ref 3.80–5.20)
RDW: 15.2 % — ABNORMAL HIGH (ref 11.5–14.5)
WBC: 10.1 x10 3/mm (ref 3.6–11.0)

## 2014-03-05 LAB — CREATININE, SERUM
CREATININE: 2.1 mg/dL — AB (ref 0.60–1.30)
EGFR (African American): 29 — ABNORMAL LOW
EGFR (Non-African Amer.): 24 — ABNORMAL LOW

## 2014-03-05 LAB — PROTIME-INR
INR: 1.4
Prothrombin Time: 17.2 secs — ABNORMAL HIGH (ref 11.5–14.7)

## 2014-03-05 LAB — HEPATIC FUNCTION PANEL A (ARMC)
ALK PHOS: 51 U/L
AST: 15 U/L (ref 15–37)
Albumin: 2.6 g/dL — ABNORMAL LOW (ref 3.4–5.0)
Bilirubin, Direct: <0.05 mg/dL (ref 0.00–0.20)
Bilirubin,Total: 0.2 mg/dL (ref 0.2–1.0)
SGPT (ALT): 14 U/L
TOTAL PROTEIN: 6.8 g/dL (ref 6.4–8.2)

## 2014-03-06 LAB — CA 125: CA 125: 2970 U/mL — ABNORMAL HIGH (ref 0.0–34.0)

## 2014-03-07 LAB — BASIC METABOLIC PANEL
ANION GAP: 10 (ref 7–16)
BUN: 31 mg/dL — ABNORMAL HIGH (ref 7–18)
CALCIUM: 8.6 mg/dL (ref 8.5–10.1)
CHLORIDE: 105 mmol/L (ref 98–107)
CO2: 24 mmol/L (ref 21–32)
CREATININE: 1.66 mg/dL — AB (ref 0.60–1.30)
EGFR (Non-African Amer.): 32 — ABNORMAL LOW
GFR CALC AF AMER: 39 — AB
GLUCOSE: 141 mg/dL — AB (ref 65–99)
Osmolality: 286 (ref 275–301)
Potassium: 4.3 mmol/L (ref 3.5–5.1)
Sodium: 139 mmol/L (ref 136–145)

## 2014-03-08 ENCOUNTER — Ambulatory Visit: Payer: Self-pay | Admitting: Internal Medicine

## 2014-03-14 LAB — BASIC METABOLIC PANEL
Anion Gap: 9 (ref 7–16)
BUN: 40 mg/dL — ABNORMAL HIGH (ref 7–18)
CALCIUM: 8.1 mg/dL — AB (ref 8.5–10.1)
CHLORIDE: 103 mmol/L (ref 98–107)
CREATININE: 2.03 mg/dL — AB (ref 0.60–1.30)
Co2: 22 mmol/L (ref 21–32)
EGFR (Non-African Amer.): 25 — ABNORMAL LOW
GFR CALC AF AMER: 31 — AB
Glucose: 327 mg/dL — ABNORMAL HIGH (ref 65–99)
OSMOLALITY: 291 (ref 275–301)
Potassium: 5.8 mmol/L — ABNORMAL HIGH (ref 3.5–5.1)
Sodium: 134 mmol/L — ABNORMAL LOW (ref 136–145)

## 2014-03-14 LAB — PROTIME-INR
INR: 1.7
Prothrombin Time: 19.2 secs — ABNORMAL HIGH (ref 11.5–14.7)

## 2014-03-16 LAB — BASIC METABOLIC PANEL
ANION GAP: 10 (ref 7–16)
BUN: 34 mg/dL — AB (ref 7–18)
CALCIUM: 8.6 mg/dL (ref 8.5–10.1)
Chloride: 104 mmol/L (ref 98–107)
Co2: 23 mmol/L (ref 21–32)
Creatinine: 1.91 mg/dL — ABNORMAL HIGH (ref 0.60–1.30)
EGFR (Non-African Amer.): 27 — ABNORMAL LOW
GFR CALC AF AMER: 33 — AB
GLUCOSE: 198 mg/dL — AB (ref 65–99)
OSMOLALITY: 287 (ref 275–301)
POTASSIUM: 4.9 mmol/L (ref 3.5–5.1)
Sodium: 137 mmol/L (ref 136–145)

## 2014-03-28 LAB — BASIC METABOLIC PANEL
ANION GAP: 10 (ref 7–16)
BUN: 27 mg/dL — AB (ref 7–18)
CHLORIDE: 105 mmol/L (ref 98–107)
CREATININE: 1.71 mg/dL — AB (ref 0.60–1.30)
Calcium, Total: 8.7 mg/dL (ref 8.5–10.1)
Co2: 23 mmol/L (ref 21–32)
GFR CALC AF AMER: 37 — AB
GFR CALC NON AF AMER: 31 — AB
Glucose: 158 mg/dL — ABNORMAL HIGH (ref 65–99)
Osmolality: 284 (ref 275–301)
Potassium: 4.5 mmol/L (ref 3.5–5.1)
Sodium: 138 mmol/L (ref 136–145)

## 2014-03-28 LAB — PROTIME-INR
INR: 1.6
PROTHROMBIN TIME: 19.1 s — AB (ref 11.5–14.7)

## 2014-03-29 LAB — CA 125: CA 125: 3003 U/mL — ABNORMAL HIGH (ref 0.0–34.0)

## 2014-04-05 LAB — PROTIME-INR
INR: 1.7
PROTHROMBIN TIME: 19.6 s — AB (ref 11.5–14.7)

## 2014-04-08 ENCOUNTER — Ambulatory Visit: Payer: Self-pay | Admitting: Internal Medicine

## 2014-04-16 LAB — PROTIME-INR
INR: 2.5
Prothrombin Time: 26.1 secs — ABNORMAL HIGH (ref 11.5–14.7)

## 2014-04-26 LAB — PROTIME-INR
INR: 1.8
Prothrombin Time: 20.8 secs — ABNORMAL HIGH (ref 11.5–14.7)

## 2014-04-26 LAB — CREATININE, SERUM
Creatinine: 1.51 mg/dL — ABNORMAL HIGH (ref 0.60–1.30)
EGFR (African American): 43 — ABNORMAL LOW
EGFR (Non-African Amer.): 36 — ABNORMAL LOW

## 2014-04-27 LAB — CA 125: CA 125: 2553 U/mL — ABNORMAL HIGH (ref 0.0–34.0)

## 2014-05-08 ENCOUNTER — Ambulatory Visit: Payer: Self-pay | Admitting: Internal Medicine

## 2014-05-10 LAB — PROTIME-INR
INR: 2.8
Prothrombin Time: 29.1 secs — ABNORMAL HIGH (ref 11.5–14.7)

## 2014-05-23 LAB — CREATININE, SERUM
CREATININE: 1.8 mg/dL — AB (ref 0.60–1.30)
EGFR (African American): 35 — ABNORMAL LOW
EGFR (Non-African Amer.): 29 — ABNORMAL LOW

## 2014-05-23 LAB — CBC CANCER CENTER
Basophil #: 0 x10 3/mm (ref 0.0–0.1)
Basophil %: 0.5 %
EOS ABS: 0.2 x10 3/mm (ref 0.0–0.7)
Eosinophil %: 2.6 %
HCT: 29.6 % — AB (ref 35.0–47.0)
HGB: 9.4 g/dL — AB (ref 12.0–16.0)
LYMPHS PCT: 19 %
Lymphocyte #: 1.4 x10 3/mm (ref 1.0–3.6)
MCH: 27 pg (ref 26.0–34.0)
MCHC: 31.9 g/dL — AB (ref 32.0–36.0)
MCV: 85 fL (ref 80–100)
Monocyte #: 0.8 x10 3/mm (ref 0.2–0.9)
Monocyte %: 10.6 %
Neutrophil #: 4.9 x10 3/mm (ref 1.4–6.5)
Neutrophil %: 67.3 %
Platelet: 363 x10 3/mm (ref 150–440)
RBC: 3.5 10*6/uL — AB (ref 3.80–5.20)
RDW: 15.9 % — ABNORMAL HIGH (ref 11.5–14.5)
WBC: 7.3 x10 3/mm (ref 3.6–11.0)

## 2014-05-23 LAB — PROTIME-INR
INR: 2.5
Prothrombin Time: 26.3 secs — ABNORMAL HIGH (ref 11.5–14.7)

## 2014-05-24 LAB — PATHOLOGY REPORT

## 2014-06-06 LAB — PROTIME-INR
INR: 2.3
PROTHROMBIN TIME: 24.9 s — AB (ref 11.5–14.7)

## 2014-06-06 LAB — CANCER CENTER HEMOGLOBIN: HGB: 9.5 g/dL — AB (ref 12.0–16.0)

## 2014-06-06 LAB — CREATININE, SERUM
CREATININE: 1.67 mg/dL — AB (ref 0.60–1.30)
EGFR (Non-African Amer.): 32 — ABNORMAL LOW
GFR CALC AF AMER: 38 — AB

## 2014-06-06 LAB — IRON AND TIBC
Iron Bind.Cap.(Total): 209 ug/dL — ABNORMAL LOW (ref 250–450)
Iron Saturation: 18 %
Iron: 37 ug/dL — ABNORMAL LOW (ref 50–170)
UNBOUND IRON-BIND. CAP.: 172 ug/dL

## 2014-06-06 LAB — FERRITIN: Ferritin (ARMC): 103 ng/mL (ref 8–388)

## 2014-06-08 ENCOUNTER — Ambulatory Visit: Payer: Self-pay | Admitting: Internal Medicine

## 2014-06-08 HISTORY — PX: OTHER SURGICAL HISTORY: SHX169

## 2014-06-13 ENCOUNTER — Emergency Department: Payer: Self-pay | Admitting: Emergency Medicine

## 2014-06-13 ENCOUNTER — Other Ambulatory Visit (HOSPITAL_COMMUNITY): Payer: Self-pay | Admitting: Internal Medicine

## 2014-06-13 DIAGNOSIS — K5641 Fecal impaction: Secondary | ICD-10-CM | POA: Diagnosis not present

## 2014-06-13 DIAGNOSIS — E119 Type 2 diabetes mellitus without complications: Secondary | ICD-10-CM | POA: Diagnosis not present

## 2014-06-13 DIAGNOSIS — I517 Cardiomegaly: Secondary | ICD-10-CM | POA: Diagnosis not present

## 2014-06-13 DIAGNOSIS — R609 Edema, unspecified: Secondary | ICD-10-CM | POA: Diagnosis not present

## 2014-06-13 DIAGNOSIS — J811 Chronic pulmonary edema: Secondary | ICD-10-CM | POA: Diagnosis not present

## 2014-06-13 DIAGNOSIS — R0689 Other abnormalities of breathing: Secondary | ICD-10-CM | POA: Diagnosis not present

## 2014-06-13 DIAGNOSIS — R0602 Shortness of breath: Secondary | ICD-10-CM | POA: Diagnosis not present

## 2014-06-13 DIAGNOSIS — I10 Essential (primary) hypertension: Secondary | ICD-10-CM | POA: Diagnosis not present

## 2014-06-13 LAB — PROTIME-INR
INR: 2.7
PROTHROMBIN TIME: 28.1 s — AB (ref 11.5–14.7)

## 2014-06-13 LAB — COMPREHENSIVE METABOLIC PANEL
AST: 17 U/L (ref 15–37)
Albumin: 2.4 g/dL — ABNORMAL LOW (ref 3.4–5.0)
Alkaline Phosphatase: 50 U/L
Anion Gap: 8 (ref 7–16)
BUN: 25 mg/dL — AB (ref 7–18)
Bilirubin,Total: 0.2 mg/dL (ref 0.2–1.0)
CHLORIDE: 107 mmol/L (ref 98–107)
CO2: 23 mmol/L (ref 21–32)
Calcium, Total: 8.8 mg/dL (ref 8.5–10.1)
Creatinine: 1.68 mg/dL — ABNORMAL HIGH (ref 0.60–1.30)
EGFR (African American): 38 — ABNORMAL LOW
GFR CALC NON AF AMER: 31 — AB
Glucose: 80 mg/dL (ref 65–99)
Osmolality: 279 (ref 275–301)
POTASSIUM: 4.4 mmol/L (ref 3.5–5.1)
SGPT (ALT): 10 U/L — ABNORMAL LOW
Sodium: 138 mmol/L (ref 136–145)
TOTAL PROTEIN: 7.3 g/dL (ref 6.4–8.2)

## 2014-06-13 LAB — CBC
HCT: 29.7 % — AB (ref 35.0–47.0)
HGB: 9.3 g/dL — ABNORMAL LOW (ref 12.0–16.0)
MCH: 26.6 pg (ref 26.0–34.0)
MCHC: 31.4 g/dL — AB (ref 32.0–36.0)
MCV: 85 fL (ref 80–100)
Platelet: 391 10*3/uL (ref 150–440)
RBC: 3.51 10*6/uL — ABNORMAL LOW (ref 3.80–5.20)
RDW: 16.3 % — ABNORMAL HIGH (ref 11.5–14.5)
WBC: 9.9 10*3/uL (ref 3.6–11.0)

## 2014-06-13 LAB — TROPONIN I

## 2014-06-13 LAB — APTT: Activated PTT: 83 secs — ABNORMAL HIGH (ref 23.6–35.9)

## 2014-06-18 DIAGNOSIS — R197 Diarrhea, unspecified: Secondary | ICD-10-CM | POA: Diagnosis not present

## 2014-06-18 DIAGNOSIS — Z7901 Long term (current) use of anticoagulants: Secondary | ICD-10-CM | POA: Diagnosis not present

## 2014-06-18 DIAGNOSIS — M25552 Pain in left hip: Secondary | ICD-10-CM | POA: Diagnosis not present

## 2014-06-18 DIAGNOSIS — I10 Essential (primary) hypertension: Secondary | ICD-10-CM | POA: Diagnosis not present

## 2014-06-18 DIAGNOSIS — C55 Malignant neoplasm of uterus, part unspecified: Secondary | ICD-10-CM | POA: Diagnosis not present

## 2014-06-18 DIAGNOSIS — K7460 Unspecified cirrhosis of liver: Secondary | ICD-10-CM | POA: Diagnosis not present

## 2014-06-18 DIAGNOSIS — R609 Edema, unspecified: Secondary | ICD-10-CM | POA: Diagnosis not present

## 2014-06-18 DIAGNOSIS — Z8541 Personal history of malignant neoplasm of cervix uteri: Secondary | ICD-10-CM | POA: Diagnosis not present

## 2014-06-18 DIAGNOSIS — Z8674 Personal history of sudden cardiac arrest: Secondary | ICD-10-CM | POA: Diagnosis not present

## 2014-06-18 DIAGNOSIS — R531 Weakness: Secondary | ICD-10-CM | POA: Diagnosis not present

## 2014-06-18 DIAGNOSIS — C4491 Basal cell carcinoma of skin, unspecified: Secondary | ICD-10-CM | POA: Diagnosis not present

## 2014-06-18 DIAGNOSIS — I739 Peripheral vascular disease, unspecified: Secondary | ICD-10-CM | POA: Diagnosis not present

## 2014-06-18 DIAGNOSIS — K769 Liver disease, unspecified: Secondary | ICD-10-CM | POA: Diagnosis not present

## 2014-06-18 DIAGNOSIS — M545 Low back pain: Secondary | ICD-10-CM | POA: Diagnosis not present

## 2014-06-18 DIAGNOSIS — C541 Malignant neoplasm of endometrium: Secondary | ICD-10-CM | POA: Diagnosis not present

## 2014-06-18 DIAGNOSIS — E780 Pure hypercholesterolemia: Secondary | ICD-10-CM | POA: Diagnosis not present

## 2014-06-18 DIAGNOSIS — R599 Enlarged lymph nodes, unspecified: Secondary | ICD-10-CM | POA: Diagnosis not present

## 2014-06-18 DIAGNOSIS — Z86711 Personal history of pulmonary embolism: Secondary | ICD-10-CM | POA: Diagnosis not present

## 2014-06-18 DIAGNOSIS — Z79899 Other long term (current) drug therapy: Secondary | ICD-10-CM | POA: Diagnosis not present

## 2014-06-18 DIAGNOSIS — R590 Localized enlarged lymph nodes: Secondary | ICD-10-CM | POA: Diagnosis not present

## 2014-06-18 DIAGNOSIS — E119 Type 2 diabetes mellitus without complications: Secondary | ICD-10-CM | POA: Diagnosis not present

## 2014-06-18 DIAGNOSIS — Z794 Long term (current) use of insulin: Secondary | ICD-10-CM | POA: Diagnosis not present

## 2014-06-18 DIAGNOSIS — Z8614 Personal history of Methicillin resistant Staphylococcus aureus infection: Secondary | ICD-10-CM | POA: Diagnosis not present

## 2014-06-18 DIAGNOSIS — D739 Disease of spleen, unspecified: Secondary | ICD-10-CM | POA: Diagnosis not present

## 2014-06-18 DIAGNOSIS — R918 Other nonspecific abnormal finding of lung field: Secondary | ICD-10-CM | POA: Diagnosis not present

## 2014-06-18 DIAGNOSIS — G629 Polyneuropathy, unspecified: Secondary | ICD-10-CM | POA: Diagnosis not present

## 2014-06-20 DIAGNOSIS — Z794 Long term (current) use of insulin: Secondary | ICD-10-CM | POA: Diagnosis not present

## 2014-06-20 DIAGNOSIS — Z8614 Personal history of Methicillin resistant Staphylococcus aureus infection: Secondary | ICD-10-CM | POA: Diagnosis not present

## 2014-06-20 DIAGNOSIS — C4491 Basal cell carcinoma of skin, unspecified: Secondary | ICD-10-CM | POA: Diagnosis not present

## 2014-06-20 DIAGNOSIS — D739 Disease of spleen, unspecified: Secondary | ICD-10-CM | POA: Diagnosis not present

## 2014-06-20 DIAGNOSIS — R599 Enlarged lymph nodes, unspecified: Secondary | ICD-10-CM | POA: Diagnosis not present

## 2014-06-20 DIAGNOSIS — R609 Edema, unspecified: Secondary | ICD-10-CM | POA: Diagnosis not present

## 2014-06-20 DIAGNOSIS — G629 Polyneuropathy, unspecified: Secondary | ICD-10-CM | POA: Diagnosis not present

## 2014-06-20 DIAGNOSIS — Z86711 Personal history of pulmonary embolism: Secondary | ICD-10-CM | POA: Diagnosis not present

## 2014-06-20 DIAGNOSIS — E10331 Type 1 diabetes mellitus with moderate nonproliferative diabetic retinopathy with macular edema: Secondary | ICD-10-CM | POA: Diagnosis not present

## 2014-06-20 DIAGNOSIS — K769 Liver disease, unspecified: Secondary | ICD-10-CM | POA: Diagnosis not present

## 2014-06-20 DIAGNOSIS — K7460 Unspecified cirrhosis of liver: Secondary | ICD-10-CM | POA: Diagnosis not present

## 2014-06-20 DIAGNOSIS — E780 Pure hypercholesterolemia: Secondary | ICD-10-CM | POA: Diagnosis not present

## 2014-06-20 DIAGNOSIS — I739 Peripheral vascular disease, unspecified: Secondary | ICD-10-CM | POA: Diagnosis not present

## 2014-06-20 DIAGNOSIS — M25552 Pain in left hip: Secondary | ICD-10-CM | POA: Diagnosis not present

## 2014-06-20 DIAGNOSIS — R590 Localized enlarged lymph nodes: Secondary | ICD-10-CM | POA: Diagnosis not present

## 2014-06-20 DIAGNOSIS — E119 Type 2 diabetes mellitus without complications: Secondary | ICD-10-CM | POA: Diagnosis not present

## 2014-06-20 DIAGNOSIS — R197 Diarrhea, unspecified: Secondary | ICD-10-CM | POA: Diagnosis not present

## 2014-06-20 DIAGNOSIS — I10 Essential (primary) hypertension: Secondary | ICD-10-CM | POA: Diagnosis not present

## 2014-06-20 DIAGNOSIS — R918 Other nonspecific abnormal finding of lung field: Secondary | ICD-10-CM | POA: Diagnosis not present

## 2014-06-20 DIAGNOSIS — C55 Malignant neoplasm of uterus, part unspecified: Secondary | ICD-10-CM | POA: Diagnosis not present

## 2014-06-20 DIAGNOSIS — Z7901 Long term (current) use of anticoagulants: Secondary | ICD-10-CM | POA: Diagnosis not present

## 2014-06-20 DIAGNOSIS — Z8541 Personal history of malignant neoplasm of cervix uteri: Secondary | ICD-10-CM | POA: Diagnosis not present

## 2014-06-20 DIAGNOSIS — Z79899 Other long term (current) drug therapy: Secondary | ICD-10-CM | POA: Diagnosis not present

## 2014-06-20 DIAGNOSIS — R531 Weakness: Secondary | ICD-10-CM | POA: Diagnosis not present

## 2014-06-20 DIAGNOSIS — M545 Low back pain: Secondary | ICD-10-CM | POA: Diagnosis not present

## 2014-06-20 DIAGNOSIS — Z8674 Personal history of sudden cardiac arrest: Secondary | ICD-10-CM | POA: Diagnosis not present

## 2014-06-20 LAB — BASIC METABOLIC PANEL
ANION GAP: 9 (ref 7–16)
BUN: 24 mg/dL — ABNORMAL HIGH (ref 7–18)
CHLORIDE: 104 mmol/L (ref 98–107)
CO2: 24 mmol/L (ref 21–32)
Calcium, Total: 8 mg/dL — ABNORMAL LOW (ref 8.5–10.1)
Creatinine: 1.69 mg/dL — ABNORMAL HIGH (ref 0.60–1.30)
EGFR (Non-African Amer.): 31 — ABNORMAL LOW
GFR CALC AF AMER: 38 — AB
Glucose: 171 mg/dL — ABNORMAL HIGH (ref 65–99)
Osmolality: 282 (ref 275–301)
Potassium: 4.1 mmol/L (ref 3.5–5.1)
SODIUM: 137 mmol/L (ref 136–145)

## 2014-06-20 LAB — PROTIME-INR
INR: 2.4
PROTHROMBIN TIME: 25.8 s — AB (ref 11.5–14.7)

## 2014-06-20 LAB — CANCER CENTER HEMOGLOBIN: HGB: 9.6 g/dL — ABNORMAL LOW (ref 12.0–16.0)

## 2014-06-21 LAB — CA 125: CA 125: 1936 U/mL — ABNORMAL HIGH (ref 0.0–34.0)

## 2014-06-26 DIAGNOSIS — L03116 Cellulitis of left lower limb: Secondary | ICD-10-CM | POA: Diagnosis not present

## 2014-06-26 DIAGNOSIS — Z794 Long term (current) use of insulin: Secondary | ICD-10-CM | POA: Diagnosis not present

## 2014-06-26 DIAGNOSIS — I10 Essential (primary) hypertension: Secondary | ICD-10-CM | POA: Diagnosis not present

## 2014-06-26 DIAGNOSIS — E119 Type 2 diabetes mellitus without complications: Secondary | ICD-10-CM | POA: Diagnosis not present

## 2014-06-26 DIAGNOSIS — Z7901 Long term (current) use of anticoagulants: Secondary | ICD-10-CM | POA: Diagnosis not present

## 2014-06-28 DIAGNOSIS — G629 Polyneuropathy, unspecified: Secondary | ICD-10-CM | POA: Diagnosis not present

## 2014-06-28 DIAGNOSIS — E780 Pure hypercholesterolemia: Secondary | ICD-10-CM | POA: Diagnosis not present

## 2014-06-28 DIAGNOSIS — C4491 Basal cell carcinoma of skin, unspecified: Secondary | ICD-10-CM | POA: Diagnosis not present

## 2014-06-28 DIAGNOSIS — I10 Essential (primary) hypertension: Secondary | ICD-10-CM | POA: Diagnosis not present

## 2014-06-28 DIAGNOSIS — R197 Diarrhea, unspecified: Secondary | ICD-10-CM | POA: Diagnosis not present

## 2014-06-28 DIAGNOSIS — Z8614 Personal history of Methicillin resistant Staphylococcus aureus infection: Secondary | ICD-10-CM | POA: Diagnosis not present

## 2014-06-28 DIAGNOSIS — R531 Weakness: Secondary | ICD-10-CM | POA: Diagnosis not present

## 2014-06-28 DIAGNOSIS — Z79899 Other long term (current) drug therapy: Secondary | ICD-10-CM | POA: Diagnosis not present

## 2014-06-28 DIAGNOSIS — R918 Other nonspecific abnormal finding of lung field: Secondary | ICD-10-CM | POA: Diagnosis not present

## 2014-06-28 DIAGNOSIS — Z8541 Personal history of malignant neoplasm of cervix uteri: Secondary | ICD-10-CM | POA: Diagnosis not present

## 2014-06-28 DIAGNOSIS — Z7901 Long term (current) use of anticoagulants: Secondary | ICD-10-CM | POA: Diagnosis not present

## 2014-06-28 DIAGNOSIS — K7460 Unspecified cirrhosis of liver: Secondary | ICD-10-CM | POA: Diagnosis not present

## 2014-06-28 DIAGNOSIS — K769 Liver disease, unspecified: Secondary | ICD-10-CM | POA: Diagnosis not present

## 2014-06-28 DIAGNOSIS — D739 Disease of spleen, unspecified: Secondary | ICD-10-CM | POA: Diagnosis not present

## 2014-06-28 DIAGNOSIS — C55 Malignant neoplasm of uterus, part unspecified: Secondary | ICD-10-CM | POA: Diagnosis not present

## 2014-06-28 DIAGNOSIS — Z8674 Personal history of sudden cardiac arrest: Secondary | ICD-10-CM | POA: Diagnosis not present

## 2014-06-28 DIAGNOSIS — R609 Edema, unspecified: Secondary | ICD-10-CM | POA: Diagnosis not present

## 2014-06-28 DIAGNOSIS — E119 Type 2 diabetes mellitus without complications: Secondary | ICD-10-CM | POA: Diagnosis not present

## 2014-06-28 DIAGNOSIS — M545 Low back pain: Secondary | ICD-10-CM | POA: Diagnosis not present

## 2014-06-28 DIAGNOSIS — R590 Localized enlarged lymph nodes: Secondary | ICD-10-CM | POA: Diagnosis not present

## 2014-06-28 DIAGNOSIS — Z86711 Personal history of pulmonary embolism: Secondary | ICD-10-CM | POA: Diagnosis not present

## 2014-06-28 DIAGNOSIS — Z794 Long term (current) use of insulin: Secondary | ICD-10-CM | POA: Diagnosis not present

## 2014-06-28 DIAGNOSIS — M25552 Pain in left hip: Secondary | ICD-10-CM | POA: Diagnosis not present

## 2014-06-28 DIAGNOSIS — R599 Enlarged lymph nodes, unspecified: Secondary | ICD-10-CM | POA: Diagnosis not present

## 2014-06-28 DIAGNOSIS — I739 Peripheral vascular disease, unspecified: Secondary | ICD-10-CM | POA: Diagnosis not present

## 2014-06-28 LAB — PROTIME-INR
INR: 2.9
Prothrombin Time: 29.8 secs — ABNORMAL HIGH (ref 11.5–14.7)

## 2014-06-28 LAB — CREATININE, SERUM
CREATININE: 1.69 mg/dL — AB (ref 0.60–1.30)
EGFR (Non-African Amer.): 31 — ABNORMAL LOW
GFR CALC AF AMER: 38 — AB

## 2014-06-28 LAB — POTASSIUM: Potassium: 4.9 mmol/L (ref 3.5–5.1)

## 2014-07-03 ENCOUNTER — Encounter: Payer: Self-pay | Admitting: Surgery

## 2014-07-03 DIAGNOSIS — M545 Low back pain: Secondary | ICD-10-CM | POA: Diagnosis not present

## 2014-07-03 DIAGNOSIS — L97811 Non-pressure chronic ulcer of other part of right lower leg limited to breakdown of skin: Secondary | ICD-10-CM | POA: Diagnosis not present

## 2014-07-03 DIAGNOSIS — R599 Enlarged lymph nodes, unspecified: Secondary | ICD-10-CM | POA: Diagnosis not present

## 2014-07-03 DIAGNOSIS — L03116 Cellulitis of left lower limb: Secondary | ICD-10-CM | POA: Diagnosis not present

## 2014-07-03 DIAGNOSIS — R918 Other nonspecific abnormal finding of lung field: Secondary | ICD-10-CM | POA: Diagnosis not present

## 2014-07-03 DIAGNOSIS — Z8614 Personal history of Methicillin resistant Staphylococcus aureus infection: Secondary | ICD-10-CM | POA: Diagnosis not present

## 2014-07-03 DIAGNOSIS — Z8674 Personal history of sudden cardiac arrest: Secondary | ICD-10-CM | POA: Diagnosis not present

## 2014-07-03 DIAGNOSIS — K7460 Unspecified cirrhosis of liver: Secondary | ICD-10-CM | POA: Diagnosis not present

## 2014-07-03 DIAGNOSIS — M25552 Pain in left hip: Secondary | ICD-10-CM | POA: Diagnosis not present

## 2014-07-03 DIAGNOSIS — L97321 Non-pressure chronic ulcer of left ankle limited to breakdown of skin: Secondary | ICD-10-CM | POA: Diagnosis not present

## 2014-07-03 DIAGNOSIS — I87313 Chronic venous hypertension (idiopathic) with ulcer of bilateral lower extremity: Secondary | ICD-10-CM | POA: Diagnosis not present

## 2014-07-03 DIAGNOSIS — Z86711 Personal history of pulmonary embolism: Secondary | ICD-10-CM | POA: Diagnosis not present

## 2014-07-03 DIAGNOSIS — Z8541 Personal history of malignant neoplasm of cervix uteri: Secondary | ICD-10-CM | POA: Diagnosis not present

## 2014-07-03 DIAGNOSIS — R609 Edema, unspecified: Secondary | ICD-10-CM | POA: Diagnosis not present

## 2014-07-03 DIAGNOSIS — E1165 Type 2 diabetes mellitus with hyperglycemia: Secondary | ICD-10-CM | POA: Diagnosis not present

## 2014-07-03 DIAGNOSIS — R531 Weakness: Secondary | ICD-10-CM | POA: Diagnosis not present

## 2014-07-03 DIAGNOSIS — Z794 Long term (current) use of insulin: Secondary | ICD-10-CM | POA: Diagnosis not present

## 2014-07-03 DIAGNOSIS — D739 Disease of spleen, unspecified: Secondary | ICD-10-CM | POA: Diagnosis not present

## 2014-07-03 DIAGNOSIS — X35XXXA Volcanic eruption, initial encounter: Secondary | ICD-10-CM | POA: Diagnosis not present

## 2014-07-03 DIAGNOSIS — Z7901 Long term (current) use of anticoagulants: Secondary | ICD-10-CM | POA: Diagnosis not present

## 2014-07-03 DIAGNOSIS — Z79899 Other long term (current) drug therapy: Secondary | ICD-10-CM | POA: Diagnosis not present

## 2014-07-03 DIAGNOSIS — I739 Peripheral vascular disease, unspecified: Secondary | ICD-10-CM | POA: Diagnosis not present

## 2014-07-03 DIAGNOSIS — L97901 Non-pressure chronic ulcer of unspecified part of unspecified lower leg limited to breakdown of skin: Secondary | ICD-10-CM | POA: Diagnosis not present

## 2014-07-03 DIAGNOSIS — C4491 Basal cell carcinoma of skin, unspecified: Secondary | ICD-10-CM | POA: Diagnosis not present

## 2014-07-03 DIAGNOSIS — E119 Type 2 diabetes mellitus without complications: Secondary | ICD-10-CM | POA: Diagnosis not present

## 2014-07-03 DIAGNOSIS — K769 Liver disease, unspecified: Secondary | ICD-10-CM | POA: Diagnosis not present

## 2014-07-03 DIAGNOSIS — I10 Essential (primary) hypertension: Secondary | ICD-10-CM | POA: Diagnosis not present

## 2014-07-03 DIAGNOSIS — G629 Polyneuropathy, unspecified: Secondary | ICD-10-CM | POA: Diagnosis not present

## 2014-07-03 DIAGNOSIS — C55 Malignant neoplasm of uterus, part unspecified: Secondary | ICD-10-CM | POA: Diagnosis not present

## 2014-07-03 DIAGNOSIS — R197 Diarrhea, unspecified: Secondary | ICD-10-CM | POA: Diagnosis not present

## 2014-07-03 DIAGNOSIS — E780 Pure hypercholesterolemia: Secondary | ICD-10-CM | POA: Diagnosis not present

## 2014-07-03 DIAGNOSIS — R590 Localized enlarged lymph nodes: Secondary | ICD-10-CM | POA: Diagnosis not present

## 2014-07-03 LAB — PROTIME-INR
INR: 2.1
Prothrombin Time: 23.1 secs — ABNORMAL HIGH (ref 11.5–14.7)

## 2014-07-03 LAB — CANCER CENTER HEMOGLOBIN: HGB: 9.4 g/dL — ABNORMAL LOW (ref 12.0–16.0)

## 2014-07-05 DIAGNOSIS — L97821 Non-pressure chronic ulcer of other part of left lower leg limited to breakdown of skin: Secondary | ICD-10-CM | POA: Diagnosis not present

## 2014-07-05 DIAGNOSIS — Z7901 Long term (current) use of anticoagulants: Secondary | ICD-10-CM | POA: Diagnosis not present

## 2014-07-05 DIAGNOSIS — I872 Venous insufficiency (chronic) (peripheral): Secondary | ICD-10-CM | POA: Diagnosis not present

## 2014-07-05 DIAGNOSIS — M1990 Unspecified osteoarthritis, unspecified site: Secondary | ICD-10-CM | POA: Diagnosis not present

## 2014-07-05 DIAGNOSIS — I10 Essential (primary) hypertension: Secondary | ICD-10-CM | POA: Diagnosis not present

## 2014-07-05 DIAGNOSIS — L97811 Non-pressure chronic ulcer of other part of right lower leg limited to breakdown of skin: Secondary | ICD-10-CM | POA: Diagnosis not present

## 2014-07-05 DIAGNOSIS — D649 Anemia, unspecified: Secondary | ICD-10-CM | POA: Diagnosis not present

## 2014-07-05 DIAGNOSIS — E1165 Type 2 diabetes mellitus with hyperglycemia: Secondary | ICD-10-CM | POA: Diagnosis not present

## 2014-07-05 DIAGNOSIS — Z794 Long term (current) use of insulin: Secondary | ICD-10-CM | POA: Diagnosis not present

## 2014-07-08 DIAGNOSIS — I10 Essential (primary) hypertension: Secondary | ICD-10-CM | POA: Diagnosis not present

## 2014-07-08 DIAGNOSIS — L97821 Non-pressure chronic ulcer of other part of left lower leg limited to breakdown of skin: Secondary | ICD-10-CM | POA: Diagnosis not present

## 2014-07-08 DIAGNOSIS — L97811 Non-pressure chronic ulcer of other part of right lower leg limited to breakdown of skin: Secondary | ICD-10-CM | POA: Diagnosis not present

## 2014-07-08 DIAGNOSIS — D649 Anemia, unspecified: Secondary | ICD-10-CM | POA: Diagnosis not present

## 2014-07-08 DIAGNOSIS — E1165 Type 2 diabetes mellitus with hyperglycemia: Secondary | ICD-10-CM | POA: Diagnosis not present

## 2014-07-08 DIAGNOSIS — Z794 Long term (current) use of insulin: Secondary | ICD-10-CM | POA: Diagnosis not present

## 2014-07-08 DIAGNOSIS — Z7901 Long term (current) use of anticoagulants: Secondary | ICD-10-CM | POA: Diagnosis not present

## 2014-07-08 DIAGNOSIS — M1990 Unspecified osteoarthritis, unspecified site: Secondary | ICD-10-CM | POA: Diagnosis not present

## 2014-07-08 DIAGNOSIS — I872 Venous insufficiency (chronic) (peripheral): Secondary | ICD-10-CM | POA: Diagnosis not present

## 2014-07-09 ENCOUNTER — Encounter: Payer: Self-pay | Admitting: Surgery

## 2014-07-09 ENCOUNTER — Ambulatory Visit: Payer: Self-pay | Admitting: Internal Medicine

## 2014-07-10 DIAGNOSIS — I25119 Atherosclerotic heart disease of native coronary artery with unspecified angina pectoris: Secondary | ICD-10-CM | POA: Diagnosis not present

## 2014-07-10 DIAGNOSIS — L97901 Non-pressure chronic ulcer of unspecified part of unspecified lower leg limited to breakdown of skin: Secondary | ICD-10-CM | POA: Diagnosis not present

## 2014-07-10 DIAGNOSIS — L03116 Cellulitis of left lower limb: Secondary | ICD-10-CM | POA: Diagnosis not present

## 2014-07-10 DIAGNOSIS — I5022 Chronic systolic (congestive) heart failure: Secondary | ICD-10-CM | POA: Diagnosis not present

## 2014-07-10 DIAGNOSIS — I70243 Atherosclerosis of native arteries of left leg with ulceration of ankle: Secondary | ICD-10-CM | POA: Diagnosis not present

## 2014-07-10 DIAGNOSIS — E1165 Type 2 diabetes mellitus with hyperglycemia: Secondary | ICD-10-CM | POA: Diagnosis not present

## 2014-07-12 DIAGNOSIS — I872 Venous insufficiency (chronic) (peripheral): Secondary | ICD-10-CM | POA: Diagnosis not present

## 2014-07-12 DIAGNOSIS — Z794 Long term (current) use of insulin: Secondary | ICD-10-CM | POA: Diagnosis not present

## 2014-07-12 DIAGNOSIS — L97811 Non-pressure chronic ulcer of other part of right lower leg limited to breakdown of skin: Secondary | ICD-10-CM | POA: Diagnosis not present

## 2014-07-12 DIAGNOSIS — I10 Essential (primary) hypertension: Secondary | ICD-10-CM | POA: Diagnosis not present

## 2014-07-12 DIAGNOSIS — D649 Anemia, unspecified: Secondary | ICD-10-CM | POA: Diagnosis not present

## 2014-07-12 DIAGNOSIS — E1165 Type 2 diabetes mellitus with hyperglycemia: Secondary | ICD-10-CM | POA: Diagnosis not present

## 2014-07-12 DIAGNOSIS — L97821 Non-pressure chronic ulcer of other part of left lower leg limited to breakdown of skin: Secondary | ICD-10-CM | POA: Diagnosis not present

## 2014-07-12 DIAGNOSIS — Z7901 Long term (current) use of anticoagulants: Secondary | ICD-10-CM | POA: Diagnosis not present

## 2014-07-12 DIAGNOSIS — M1990 Unspecified osteoarthritis, unspecified site: Secondary | ICD-10-CM | POA: Diagnosis not present

## 2014-07-13 DIAGNOSIS — E119 Type 2 diabetes mellitus without complications: Secondary | ICD-10-CM | POA: Diagnosis not present

## 2014-07-13 DIAGNOSIS — Z794 Long term (current) use of insulin: Secondary | ICD-10-CM | POA: Diagnosis not present

## 2014-07-13 DIAGNOSIS — M7989 Other specified soft tissue disorders: Secondary | ICD-10-CM | POA: Diagnosis not present

## 2014-07-13 DIAGNOSIS — M545 Low back pain: Secondary | ICD-10-CM | POA: Diagnosis not present

## 2014-07-13 DIAGNOSIS — Z7901 Long term (current) use of anticoagulants: Secondary | ICD-10-CM | POA: Diagnosis not present

## 2014-07-13 DIAGNOSIS — K769 Liver disease, unspecified: Secondary | ICD-10-CM | POA: Diagnosis not present

## 2014-07-13 DIAGNOSIS — G8929 Other chronic pain: Secondary | ICD-10-CM | POA: Diagnosis not present

## 2014-07-13 DIAGNOSIS — M79605 Pain in left leg: Secondary | ICD-10-CM | POA: Diagnosis not present

## 2014-07-13 DIAGNOSIS — M25552 Pain in left hip: Secondary | ICD-10-CM | POA: Diagnosis not present

## 2014-07-13 DIAGNOSIS — R197 Diarrhea, unspecified: Secondary | ICD-10-CM | POA: Diagnosis not present

## 2014-07-13 DIAGNOSIS — R32 Unspecified urinary incontinence: Secondary | ICD-10-CM | POA: Diagnosis not present

## 2014-07-13 DIAGNOSIS — I10 Essential (primary) hypertension: Secondary | ICD-10-CM | POA: Diagnosis not present

## 2014-07-13 DIAGNOSIS — M79604 Pain in right leg: Secondary | ICD-10-CM | POA: Diagnosis not present

## 2014-07-13 DIAGNOSIS — C4491 Basal cell carcinoma of skin, unspecified: Secondary | ICD-10-CM | POA: Diagnosis not present

## 2014-07-13 DIAGNOSIS — C55 Malignant neoplasm of uterus, part unspecified: Secondary | ICD-10-CM | POA: Diagnosis not present

## 2014-07-13 DIAGNOSIS — K589 Irritable bowel syndrome without diarrhea: Secondary | ICD-10-CM | POA: Diagnosis not present

## 2014-07-13 DIAGNOSIS — R918 Other nonspecific abnormal finding of lung field: Secondary | ICD-10-CM | POA: Diagnosis not present

## 2014-07-13 DIAGNOSIS — N189 Chronic kidney disease, unspecified: Secondary | ICD-10-CM | POA: Diagnosis not present

## 2014-07-13 DIAGNOSIS — T148 Other injury of unspecified body region: Secondary | ICD-10-CM | POA: Diagnosis not present

## 2014-07-13 DIAGNOSIS — R971 Elevated cancer antigen 125 [CA 125]: Secondary | ICD-10-CM | POA: Diagnosis not present

## 2014-07-13 DIAGNOSIS — Z79899 Other long term (current) drug therapy: Secondary | ICD-10-CM | POA: Diagnosis not present

## 2014-07-13 DIAGNOSIS — E780 Pure hypercholesterolemia: Secondary | ICD-10-CM | POA: Diagnosis not present

## 2014-07-13 DIAGNOSIS — K219 Gastro-esophageal reflux disease without esophagitis: Secondary | ICD-10-CM | POA: Diagnosis not present

## 2014-07-13 DIAGNOSIS — R630 Anorexia: Secondary | ICD-10-CM | POA: Diagnosis not present

## 2014-07-13 DIAGNOSIS — R109 Unspecified abdominal pain: Secondary | ICD-10-CM | POA: Diagnosis not present

## 2014-07-13 LAB — CANCER CENTER HEMOGLOBIN: HGB: 9 g/dL — ABNORMAL LOW (ref 12.0–16.0)

## 2014-07-13 LAB — BASIC METABOLIC PANEL
Anion Gap: 9 (ref 7–16)
BUN: 30 mg/dL — AB (ref 7–18)
CHLORIDE: 101 mmol/L (ref 98–107)
CO2: 26 mmol/L (ref 21–32)
Calcium, Total: 8.2 mg/dL — ABNORMAL LOW (ref 8.5–10.1)
Creatinine: 1.71 mg/dL — ABNORMAL HIGH (ref 0.60–1.30)
EGFR (African American): 37 — ABNORMAL LOW
EGFR (Non-African Amer.): 31 — ABNORMAL LOW
Glucose: 218 mg/dL — ABNORMAL HIGH (ref 65–99)
OSMOLALITY: 285 (ref 275–301)
Potassium: 4.8 mmol/L (ref 3.5–5.1)
Sodium: 136 mmol/L (ref 136–145)

## 2014-07-13 LAB — PROTIME-INR
INR: 2.9
Prothrombin Time: 30.6 secs — ABNORMAL HIGH

## 2014-07-17 DIAGNOSIS — I70243 Atherosclerosis of native arteries of left leg with ulceration of ankle: Secondary | ICD-10-CM | POA: Diagnosis not present

## 2014-07-17 DIAGNOSIS — I5022 Chronic systolic (congestive) heart failure: Secondary | ICD-10-CM | POA: Diagnosis not present

## 2014-07-17 DIAGNOSIS — L97901 Non-pressure chronic ulcer of unspecified part of unspecified lower leg limited to breakdown of skin: Secondary | ICD-10-CM | POA: Diagnosis not present

## 2014-07-17 DIAGNOSIS — I25119 Atherosclerotic heart disease of native coronary artery with unspecified angina pectoris: Secondary | ICD-10-CM | POA: Diagnosis not present

## 2014-07-17 DIAGNOSIS — L03116 Cellulitis of left lower limb: Secondary | ICD-10-CM | POA: Diagnosis not present

## 2014-07-17 DIAGNOSIS — E1165 Type 2 diabetes mellitus with hyperglycemia: Secondary | ICD-10-CM | POA: Diagnosis not present

## 2014-07-18 DIAGNOSIS — R32 Unspecified urinary incontinence: Secondary | ICD-10-CM | POA: Diagnosis not present

## 2014-07-18 DIAGNOSIS — C4491 Basal cell carcinoma of skin, unspecified: Secondary | ICD-10-CM | POA: Diagnosis not present

## 2014-07-18 DIAGNOSIS — M545 Low back pain: Secondary | ICD-10-CM | POA: Diagnosis not present

## 2014-07-18 DIAGNOSIS — R630 Anorexia: Secondary | ICD-10-CM | POA: Diagnosis not present

## 2014-07-18 DIAGNOSIS — I10 Essential (primary) hypertension: Secondary | ICD-10-CM | POA: Diagnosis not present

## 2014-07-18 DIAGNOSIS — G8929 Other chronic pain: Secondary | ICD-10-CM | POA: Diagnosis not present

## 2014-07-18 DIAGNOSIS — R971 Elevated cancer antigen 125 [CA 125]: Secondary | ICD-10-CM | POA: Diagnosis not present

## 2014-07-18 DIAGNOSIS — M25552 Pain in left hip: Secondary | ICD-10-CM | POA: Diagnosis not present

## 2014-07-18 DIAGNOSIS — K589 Irritable bowel syndrome without diarrhea: Secondary | ICD-10-CM | POA: Diagnosis not present

## 2014-07-18 DIAGNOSIS — M79605 Pain in left leg: Secondary | ICD-10-CM | POA: Diagnosis not present

## 2014-07-18 DIAGNOSIS — E119 Type 2 diabetes mellitus without complications: Secondary | ICD-10-CM | POA: Diagnosis not present

## 2014-07-18 DIAGNOSIS — C55 Malignant neoplasm of uterus, part unspecified: Secondary | ICD-10-CM | POA: Diagnosis not present

## 2014-07-18 DIAGNOSIS — M7989 Other specified soft tissue disorders: Secondary | ICD-10-CM | POA: Diagnosis not present

## 2014-07-18 DIAGNOSIS — Z79899 Other long term (current) drug therapy: Secondary | ICD-10-CM | POA: Diagnosis not present

## 2014-07-18 DIAGNOSIS — Z7901 Long term (current) use of anticoagulants: Secondary | ICD-10-CM | POA: Diagnosis not present

## 2014-07-18 DIAGNOSIS — R918 Other nonspecific abnormal finding of lung field: Secondary | ICD-10-CM | POA: Diagnosis not present

## 2014-07-18 DIAGNOSIS — T148 Other injury of unspecified body region: Secondary | ICD-10-CM | POA: Diagnosis not present

## 2014-07-18 DIAGNOSIS — R197 Diarrhea, unspecified: Secondary | ICD-10-CM | POA: Diagnosis not present

## 2014-07-18 DIAGNOSIS — K769 Liver disease, unspecified: Secondary | ICD-10-CM | POA: Diagnosis not present

## 2014-07-18 DIAGNOSIS — K219 Gastro-esophageal reflux disease without esophagitis: Secondary | ICD-10-CM | POA: Diagnosis not present

## 2014-07-18 DIAGNOSIS — M79604 Pain in right leg: Secondary | ICD-10-CM | POA: Diagnosis not present

## 2014-07-18 DIAGNOSIS — R109 Unspecified abdominal pain: Secondary | ICD-10-CM | POA: Diagnosis not present

## 2014-07-18 DIAGNOSIS — N189 Chronic kidney disease, unspecified: Secondary | ICD-10-CM | POA: Diagnosis not present

## 2014-07-18 DIAGNOSIS — E780 Pure hypercholesterolemia: Secondary | ICD-10-CM | POA: Diagnosis not present

## 2014-07-18 DIAGNOSIS — Z794 Long term (current) use of insulin: Secondary | ICD-10-CM | POA: Diagnosis not present

## 2014-07-19 DIAGNOSIS — L97821 Non-pressure chronic ulcer of other part of left lower leg limited to breakdown of skin: Secondary | ICD-10-CM | POA: Diagnosis not present

## 2014-07-19 DIAGNOSIS — Z7901 Long term (current) use of anticoagulants: Secondary | ICD-10-CM | POA: Diagnosis not present

## 2014-07-19 DIAGNOSIS — D649 Anemia, unspecified: Secondary | ICD-10-CM | POA: Diagnosis not present

## 2014-07-19 DIAGNOSIS — L97811 Non-pressure chronic ulcer of other part of right lower leg limited to breakdown of skin: Secondary | ICD-10-CM | POA: Diagnosis not present

## 2014-07-19 DIAGNOSIS — I872 Venous insufficiency (chronic) (peripheral): Secondary | ICD-10-CM | POA: Diagnosis not present

## 2014-07-19 DIAGNOSIS — Z794 Long term (current) use of insulin: Secondary | ICD-10-CM | POA: Diagnosis not present

## 2014-07-19 DIAGNOSIS — E1165 Type 2 diabetes mellitus with hyperglycemia: Secondary | ICD-10-CM | POA: Diagnosis not present

## 2014-07-19 DIAGNOSIS — I10 Essential (primary) hypertension: Secondary | ICD-10-CM | POA: Diagnosis not present

## 2014-07-19 DIAGNOSIS — M1990 Unspecified osteoarthritis, unspecified site: Secondary | ICD-10-CM | POA: Diagnosis not present

## 2014-07-24 DIAGNOSIS — L97901 Non-pressure chronic ulcer of unspecified part of unspecified lower leg limited to breakdown of skin: Secondary | ICD-10-CM | POA: Diagnosis not present

## 2014-07-24 DIAGNOSIS — E1165 Type 2 diabetes mellitus with hyperglycemia: Secondary | ICD-10-CM | POA: Diagnosis not present

## 2014-07-24 DIAGNOSIS — I25119 Atherosclerotic heart disease of native coronary artery with unspecified angina pectoris: Secondary | ICD-10-CM | POA: Diagnosis not present

## 2014-07-24 DIAGNOSIS — I5022 Chronic systolic (congestive) heart failure: Secondary | ICD-10-CM | POA: Diagnosis not present

## 2014-07-24 DIAGNOSIS — L03116 Cellulitis of left lower limb: Secondary | ICD-10-CM | POA: Diagnosis not present

## 2014-07-24 DIAGNOSIS — L97821 Non-pressure chronic ulcer of other part of left lower leg limited to breakdown of skin: Secondary | ICD-10-CM | POA: Diagnosis not present

## 2014-07-24 DIAGNOSIS — I70243 Atherosclerosis of native arteries of left leg with ulceration of ankle: Secondary | ICD-10-CM | POA: Diagnosis not present

## 2014-07-25 DIAGNOSIS — M1990 Unspecified osteoarthritis, unspecified site: Secondary | ICD-10-CM | POA: Diagnosis not present

## 2014-07-25 DIAGNOSIS — I872 Venous insufficiency (chronic) (peripheral): Secondary | ICD-10-CM | POA: Diagnosis not present

## 2014-07-25 DIAGNOSIS — E1165 Type 2 diabetes mellitus with hyperglycemia: Secondary | ICD-10-CM | POA: Diagnosis not present

## 2014-07-25 DIAGNOSIS — I10 Essential (primary) hypertension: Secondary | ICD-10-CM | POA: Diagnosis not present

## 2014-07-25 DIAGNOSIS — L97811 Non-pressure chronic ulcer of other part of right lower leg limited to breakdown of skin: Secondary | ICD-10-CM | POA: Diagnosis not present

## 2014-07-25 DIAGNOSIS — Z7901 Long term (current) use of anticoagulants: Secondary | ICD-10-CM | POA: Diagnosis not present

## 2014-07-25 DIAGNOSIS — L97821 Non-pressure chronic ulcer of other part of left lower leg limited to breakdown of skin: Secondary | ICD-10-CM | POA: Diagnosis not present

## 2014-07-25 DIAGNOSIS — Z794 Long term (current) use of insulin: Secondary | ICD-10-CM | POA: Diagnosis not present

## 2014-07-25 DIAGNOSIS — D649 Anemia, unspecified: Secondary | ICD-10-CM | POA: Diagnosis not present

## 2014-07-26 ENCOUNTER — Emergency Department: Payer: Self-pay | Admitting: Emergency Medicine

## 2014-07-26 DIAGNOSIS — R911 Solitary pulmonary nodule: Secondary | ICD-10-CM | POA: Diagnosis not present

## 2014-07-26 DIAGNOSIS — M25552 Pain in left hip: Secondary | ICD-10-CM | POA: Diagnosis not present

## 2014-07-26 DIAGNOSIS — L97811 Non-pressure chronic ulcer of other part of right lower leg limited to breakdown of skin: Secondary | ICD-10-CM | POA: Diagnosis not present

## 2014-07-26 DIAGNOSIS — E119 Type 2 diabetes mellitus without complications: Secondary | ICD-10-CM | POA: Diagnosis not present

## 2014-07-26 DIAGNOSIS — M549 Dorsalgia, unspecified: Secondary | ICD-10-CM | POA: Diagnosis not present

## 2014-07-26 DIAGNOSIS — M1990 Unspecified osteoarthritis, unspecified site: Secondary | ICD-10-CM | POA: Diagnosis not present

## 2014-07-26 DIAGNOSIS — M546 Pain in thoracic spine: Secondary | ICD-10-CM | POA: Diagnosis not present

## 2014-07-26 DIAGNOSIS — M79652 Pain in left thigh: Secondary | ICD-10-CM | POA: Diagnosis not present

## 2014-07-26 DIAGNOSIS — I872 Venous insufficiency (chronic) (peripheral): Secondary | ICD-10-CM | POA: Diagnosis not present

## 2014-07-26 DIAGNOSIS — Z7901 Long term (current) use of anticoagulants: Secondary | ICD-10-CM | POA: Diagnosis not present

## 2014-07-26 DIAGNOSIS — Z794 Long term (current) use of insulin: Secondary | ICD-10-CM | POA: Diagnosis not present

## 2014-07-26 DIAGNOSIS — S24109A Unspecified injury at unspecified level of thoracic spinal cord, initial encounter: Secondary | ICD-10-CM | POA: Diagnosis not present

## 2014-07-26 DIAGNOSIS — L97821 Non-pressure chronic ulcer of other part of left lower leg limited to breakdown of skin: Secondary | ICD-10-CM | POA: Diagnosis not present

## 2014-07-26 DIAGNOSIS — R079 Chest pain, unspecified: Secondary | ICD-10-CM | POA: Diagnosis not present

## 2014-07-26 DIAGNOSIS — S299XXA Unspecified injury of thorax, initial encounter: Secondary | ICD-10-CM | POA: Diagnosis not present

## 2014-07-26 DIAGNOSIS — E1165 Type 2 diabetes mellitus with hyperglycemia: Secondary | ICD-10-CM | POA: Diagnosis not present

## 2014-07-26 DIAGNOSIS — D649 Anemia, unspecified: Secondary | ICD-10-CM | POA: Diagnosis not present

## 2014-07-26 DIAGNOSIS — W19XXXA Unspecified fall, initial encounter: Secondary | ICD-10-CM | POA: Diagnosis not present

## 2014-07-26 DIAGNOSIS — I10 Essential (primary) hypertension: Secondary | ICD-10-CM | POA: Diagnosis not present

## 2014-07-26 DIAGNOSIS — R102 Pelvic and perineal pain: Secondary | ICD-10-CM | POA: Diagnosis not present

## 2014-07-26 DIAGNOSIS — S199XXA Unspecified injury of neck, initial encounter: Secondary | ICD-10-CM | POA: Diagnosis not present

## 2014-07-27 ENCOUNTER — Emergency Department: Payer: Self-pay | Admitting: Emergency Medicine

## 2014-07-27 DIAGNOSIS — M25552 Pain in left hip: Secondary | ICD-10-CM | POA: Diagnosis not present

## 2014-07-27 DIAGNOSIS — I872 Venous insufficiency (chronic) (peripheral): Secondary | ICD-10-CM | POA: Diagnosis not present

## 2014-07-27 DIAGNOSIS — Z79899 Other long term (current) drug therapy: Secondary | ICD-10-CM | POA: Diagnosis not present

## 2014-07-27 DIAGNOSIS — I10 Essential (primary) hypertension: Secondary | ICD-10-CM | POA: Diagnosis not present

## 2014-07-27 DIAGNOSIS — L97821 Non-pressure chronic ulcer of other part of left lower leg limited to breakdown of skin: Secondary | ICD-10-CM | POA: Diagnosis not present

## 2014-07-27 DIAGNOSIS — M1990 Unspecified osteoarthritis, unspecified site: Secondary | ICD-10-CM | POA: Diagnosis not present

## 2014-07-27 DIAGNOSIS — D649 Anemia, unspecified: Secondary | ICD-10-CM | POA: Diagnosis not present

## 2014-07-27 DIAGNOSIS — Z7901 Long term (current) use of anticoagulants: Secondary | ICD-10-CM | POA: Diagnosis not present

## 2014-07-27 DIAGNOSIS — R0689 Other abnormalities of breathing: Secondary | ICD-10-CM | POA: Diagnosis not present

## 2014-07-27 DIAGNOSIS — Z794 Long term (current) use of insulin: Secondary | ICD-10-CM | POA: Diagnosis not present

## 2014-07-27 DIAGNOSIS — E119 Type 2 diabetes mellitus without complications: Secondary | ICD-10-CM | POA: Diagnosis not present

## 2014-07-27 DIAGNOSIS — L97811 Non-pressure chronic ulcer of other part of right lower leg limited to breakdown of skin: Secondary | ICD-10-CM | POA: Diagnosis not present

## 2014-07-27 DIAGNOSIS — S79912A Unspecified injury of left hip, initial encounter: Secondary | ICD-10-CM | POA: Diagnosis not present

## 2014-07-27 DIAGNOSIS — R52 Pain, unspecified: Secondary | ICD-10-CM | POA: Diagnosis not present

## 2014-07-27 DIAGNOSIS — E1165 Type 2 diabetes mellitus with hyperglycemia: Secondary | ICD-10-CM | POA: Diagnosis not present

## 2014-07-27 DIAGNOSIS — S7002XA Contusion of left hip, initial encounter: Secondary | ICD-10-CM | POA: Diagnosis not present

## 2014-07-30 ENCOUNTER — Inpatient Hospital Stay: Payer: Self-pay | Admitting: Internal Medicine

## 2014-07-30 DIAGNOSIS — C541 Malignant neoplasm of endometrium: Secondary | ICD-10-CM | POA: Diagnosis not present

## 2014-07-30 DIAGNOSIS — J9600 Acute respiratory failure, unspecified whether with hypoxia or hypercapnia: Secondary | ICD-10-CM | POA: Diagnosis not present

## 2014-07-30 DIAGNOSIS — Z9842 Cataract extraction status, left eye: Secondary | ICD-10-CM | POA: Diagnosis not present

## 2014-07-30 DIAGNOSIS — E785 Hyperlipidemia, unspecified: Secondary | ICD-10-CM | POA: Diagnosis not present

## 2014-07-30 DIAGNOSIS — L97922 Non-pressure chronic ulcer of unspecified part of left lower leg with fat layer exposed: Secondary | ICD-10-CM | POA: Diagnosis not present

## 2014-07-30 DIAGNOSIS — D638 Anemia in other chronic diseases classified elsewhere: Secondary | ICD-10-CM | POA: Diagnosis not present

## 2014-07-30 DIAGNOSIS — R509 Fever, unspecified: Secondary | ICD-10-CM | POA: Diagnosis not present

## 2014-07-30 DIAGNOSIS — J811 Chronic pulmonary edema: Secondary | ICD-10-CM | POA: Diagnosis not present

## 2014-07-30 DIAGNOSIS — R209 Unspecified disturbances of skin sensation: Secondary | ICD-10-CM | POA: Diagnosis not present

## 2014-07-30 DIAGNOSIS — K589 Irritable bowel syndrome without diarrhea: Secondary | ICD-10-CM | POA: Diagnosis not present

## 2014-07-30 DIAGNOSIS — B9689 Other specified bacterial agents as the cause of diseases classified elsewhere: Secondary | ICD-10-CM | POA: Diagnosis not present

## 2014-07-30 DIAGNOSIS — R609 Edema, unspecified: Secondary | ICD-10-CM | POA: Diagnosis not present

## 2014-07-30 DIAGNOSIS — R0600 Dyspnea, unspecified: Secondary | ICD-10-CM | POA: Diagnosis not present

## 2014-07-30 DIAGNOSIS — R531 Weakness: Secondary | ICD-10-CM | POA: Diagnosis not present

## 2014-07-30 DIAGNOSIS — A419 Sepsis, unspecified organism: Secondary | ICD-10-CM | POA: Diagnosis not present

## 2014-07-30 DIAGNOSIS — I129 Hypertensive chronic kidney disease with stage 1 through stage 4 chronic kidney disease, or unspecified chronic kidney disease: Secondary | ICD-10-CM | POA: Diagnosis not present

## 2014-07-30 DIAGNOSIS — M79605 Pain in left leg: Secondary | ICD-10-CM | POA: Diagnosis not present

## 2014-07-30 DIAGNOSIS — K219 Gastro-esophageal reflux disease without esophagitis: Secondary | ICD-10-CM | POA: Diagnosis not present

## 2014-07-30 DIAGNOSIS — R262 Difficulty in walking, not elsewhere classified: Secondary | ICD-10-CM | POA: Diagnosis not present

## 2014-07-30 DIAGNOSIS — I313 Pericardial effusion (noninflammatory): Secondary | ICD-10-CM | POA: Diagnosis not present

## 2014-07-30 DIAGNOSIS — R350 Frequency of micturition: Secondary | ICD-10-CM | POA: Diagnosis not present

## 2014-07-30 DIAGNOSIS — N183 Chronic kidney disease, stage 3 (moderate): Secondary | ICD-10-CM | POA: Diagnosis not present

## 2014-07-30 DIAGNOSIS — J9811 Atelectasis: Secondary | ICD-10-CM | POA: Diagnosis not present

## 2014-07-30 DIAGNOSIS — L97929 Non-pressure chronic ulcer of unspecified part of left lower leg with unspecified severity: Secondary | ICD-10-CM | POA: Diagnosis not present

## 2014-07-30 DIAGNOSIS — I509 Heart failure, unspecified: Secondary | ICD-10-CM | POA: Diagnosis not present

## 2014-07-30 DIAGNOSIS — J9601 Acute respiratory failure with hypoxia: Secondary | ICD-10-CM | POA: Diagnosis not present

## 2014-07-30 DIAGNOSIS — C787 Secondary malignant neoplasm of liver and intrahepatic bile duct: Secondary | ICD-10-CM | POA: Diagnosis not present

## 2014-07-30 DIAGNOSIS — Z86711 Personal history of pulmonary embolism: Secondary | ICD-10-CM | POA: Diagnosis not present

## 2014-07-30 DIAGNOSIS — I10 Essential (primary) hypertension: Secondary | ICD-10-CM | POA: Diagnosis not present

## 2014-07-30 DIAGNOSIS — N179 Acute kidney failure, unspecified: Secondary | ICD-10-CM | POA: Diagnosis not present

## 2014-07-30 DIAGNOSIS — D070 Carcinoma in situ of endometrium: Secondary | ICD-10-CM | POA: Diagnosis not present

## 2014-07-30 DIAGNOSIS — I517 Cardiomegaly: Secondary | ICD-10-CM | POA: Diagnosis not present

## 2014-07-30 DIAGNOSIS — M79604 Pain in right leg: Secondary | ICD-10-CM | POA: Diagnosis not present

## 2014-07-30 DIAGNOSIS — I5033 Acute on chronic diastolic (congestive) heart failure: Secondary | ICD-10-CM | POA: Diagnosis not present

## 2014-07-30 DIAGNOSIS — Z90710 Acquired absence of both cervix and uterus: Secondary | ICD-10-CM | POA: Diagnosis not present

## 2014-07-30 DIAGNOSIS — I739 Peripheral vascular disease, unspecified: Secondary | ICD-10-CM | POA: Diagnosis not present

## 2014-07-30 DIAGNOSIS — Z9841 Cataract extraction status, right eye: Secondary | ICD-10-CM | POA: Diagnosis not present

## 2014-07-30 DIAGNOSIS — L97919 Non-pressure chronic ulcer of unspecified part of right lower leg with unspecified severity: Secondary | ICD-10-CM | POA: Diagnosis not present

## 2014-07-30 DIAGNOSIS — N390 Urinary tract infection, site not specified: Secondary | ICD-10-CM | POA: Diagnosis not present

## 2014-07-30 DIAGNOSIS — I5032 Chronic diastolic (congestive) heart failure: Secondary | ICD-10-CM | POA: Diagnosis not present

## 2014-07-30 DIAGNOSIS — R52 Pain, unspecified: Secondary | ICD-10-CM | POA: Diagnosis not present

## 2014-07-30 DIAGNOSIS — E119 Type 2 diabetes mellitus without complications: Secondary | ICD-10-CM | POA: Diagnosis not present

## 2014-07-30 DIAGNOSIS — L89150 Pressure ulcer of sacral region, unstageable: Secondary | ICD-10-CM | POA: Diagnosis not present

## 2014-07-30 DIAGNOSIS — R0902 Hypoxemia: Secondary | ICD-10-CM | POA: Diagnosis not present

## 2014-07-30 DIAGNOSIS — M6281 Muscle weakness (generalized): Secondary | ICD-10-CM | POA: Diagnosis not present

## 2014-07-30 DIAGNOSIS — Z5189 Encounter for other specified aftercare: Secondary | ICD-10-CM | POA: Diagnosis not present

## 2014-07-30 DIAGNOSIS — R3914 Feeling of incomplete bladder emptying: Secondary | ICD-10-CM | POA: Diagnosis not present

## 2014-07-30 DIAGNOSIS — B951 Streptococcus, group B, as the cause of diseases classified elsewhere: Secondary | ICD-10-CM | POA: Diagnosis not present

## 2014-07-30 DIAGNOSIS — D72829 Elevated white blood cell count, unspecified: Secondary | ICD-10-CM | POA: Diagnosis not present

## 2014-07-31 DIAGNOSIS — R3914 Feeling of incomplete bladder emptying: Secondary | ICD-10-CM | POA: Diagnosis not present

## 2014-07-31 DIAGNOSIS — K589 Irritable bowel syndrome without diarrhea: Secondary | ICD-10-CM | POA: Diagnosis not present

## 2014-08-02 DIAGNOSIS — K589 Irritable bowel syndrome without diarrhea: Secondary | ICD-10-CM | POA: Diagnosis not present

## 2014-08-02 DIAGNOSIS — R3914 Feeling of incomplete bladder emptying: Secondary | ICD-10-CM | POA: Diagnosis not present

## 2014-08-06 DIAGNOSIS — M25552 Pain in left hip: Secondary | ICD-10-CM | POA: Diagnosis not present

## 2014-08-06 DIAGNOSIS — M79604 Pain in right leg: Secondary | ICD-10-CM | POA: Diagnosis not present

## 2014-08-06 DIAGNOSIS — Z5189 Encounter for other specified aftercare: Secondary | ICD-10-CM | POA: Diagnosis not present

## 2014-08-06 DIAGNOSIS — R262 Difficulty in walking, not elsewhere classified: Secondary | ICD-10-CM | POA: Diagnosis not present

## 2014-08-06 DIAGNOSIS — R109 Unspecified abdominal pain: Secondary | ICD-10-CM | POA: Diagnosis not present

## 2014-08-06 DIAGNOSIS — N189 Chronic kidney disease, unspecified: Secondary | ICD-10-CM | POA: Diagnosis not present

## 2014-08-06 DIAGNOSIS — I5032 Chronic diastolic (congestive) heart failure: Secondary | ICD-10-CM | POA: Diagnosis not present

## 2014-08-06 DIAGNOSIS — M545 Low back pain: Secondary | ICD-10-CM | POA: Diagnosis not present

## 2014-08-06 DIAGNOSIS — C775 Secondary and unspecified malignant neoplasm of intrapelvic lymph nodes: Secondary | ICD-10-CM | POA: Diagnosis not present

## 2014-08-06 DIAGNOSIS — K769 Liver disease, unspecified: Secondary | ICD-10-CM | POA: Diagnosis not present

## 2014-08-06 DIAGNOSIS — I4891 Unspecified atrial fibrillation: Secondary | ICD-10-CM | POA: Diagnosis not present

## 2014-08-06 DIAGNOSIS — L97309 Non-pressure chronic ulcer of unspecified ankle with unspecified severity: Secondary | ICD-10-CM | POA: Diagnosis not present

## 2014-08-06 DIAGNOSIS — L97119 Non-pressure chronic ulcer of right thigh with unspecified severity: Secondary | ICD-10-CM | POA: Diagnosis not present

## 2014-08-06 DIAGNOSIS — D0910 Carcinoma in situ of unspecified urinary organ: Secondary | ICD-10-CM | POA: Diagnosis not present

## 2014-08-06 DIAGNOSIS — K219 Gastro-esophageal reflux disease without esophagitis: Secondary | ICD-10-CM | POA: Diagnosis not present

## 2014-08-06 DIAGNOSIS — Z7901 Long term (current) use of anticoagulants: Secondary | ICD-10-CM | POA: Diagnosis not present

## 2014-08-06 DIAGNOSIS — E784 Other hyperlipidemia: Secondary | ICD-10-CM | POA: Diagnosis not present

## 2014-08-06 DIAGNOSIS — L89620 Pressure ulcer of left heel, unstageable: Secondary | ICD-10-CM | POA: Diagnosis not present

## 2014-08-06 DIAGNOSIS — L853 Xerosis cutis: Secondary | ICD-10-CM | POA: Diagnosis not present

## 2014-08-06 DIAGNOSIS — I509 Heart failure, unspecified: Secondary | ICD-10-CM | POA: Diagnosis not present

## 2014-08-06 DIAGNOSIS — K589 Irritable bowel syndrome without diarrhea: Secondary | ICD-10-CM | POA: Diagnosis not present

## 2014-08-06 DIAGNOSIS — I890 Lymphedema, not elsewhere classified: Secondary | ICD-10-CM | POA: Diagnosis not present

## 2014-08-06 DIAGNOSIS — R971 Elevated cancer antigen 125 [CA 125]: Secondary | ICD-10-CM | POA: Diagnosis not present

## 2014-08-06 DIAGNOSIS — L89150 Pressure ulcer of sacral region, unstageable: Secondary | ICD-10-CM | POA: Diagnosis not present

## 2014-08-06 DIAGNOSIS — I10 Essential (primary) hypertension: Secondary | ICD-10-CM | POA: Diagnosis not present

## 2014-08-06 DIAGNOSIS — L97112 Non-pressure chronic ulcer of right thigh with fat layer exposed: Secondary | ICD-10-CM | POA: Diagnosis not present

## 2014-08-06 DIAGNOSIS — I739 Peripheral vascular disease, unspecified: Secondary | ICD-10-CM | POA: Diagnosis not present

## 2014-08-06 DIAGNOSIS — R3914 Feeling of incomplete bladder emptying: Secondary | ICD-10-CM | POA: Diagnosis not present

## 2014-08-06 DIAGNOSIS — I2782 Chronic pulmonary embolism: Secondary | ICD-10-CM | POA: Diagnosis not present

## 2014-08-06 DIAGNOSIS — E11649 Type 2 diabetes mellitus with hypoglycemia without coma: Secondary | ICD-10-CM | POA: Diagnosis not present

## 2014-08-06 DIAGNOSIS — C55 Malignant neoplasm of uterus, part unspecified: Secondary | ICD-10-CM | POA: Diagnosis not present

## 2014-08-06 DIAGNOSIS — M6281 Muscle weakness (generalized): Secondary | ICD-10-CM | POA: Diagnosis not present

## 2014-08-06 DIAGNOSIS — N390 Urinary tract infection, site not specified: Secondary | ICD-10-CM | POA: Diagnosis not present

## 2014-08-06 DIAGNOSIS — L97529 Non-pressure chronic ulcer of other part of left foot with unspecified severity: Secondary | ICD-10-CM | POA: Diagnosis not present

## 2014-08-06 DIAGNOSIS — G933 Postviral fatigue syndrome: Secondary | ICD-10-CM | POA: Diagnosis not present

## 2014-08-06 DIAGNOSIS — R918 Other nonspecific abnormal finding of lung field: Secondary | ICD-10-CM | POA: Diagnosis not present

## 2014-08-06 DIAGNOSIS — R531 Weakness: Secondary | ICD-10-CM | POA: Diagnosis not present

## 2014-08-06 DIAGNOSIS — I70222 Atherosclerosis of native arteries of extremities with rest pain, left leg: Secondary | ICD-10-CM | POA: Diagnosis not present

## 2014-08-06 DIAGNOSIS — E10331 Type 1 diabetes mellitus with moderate nonproliferative diabetic retinopathy with macular edema: Secondary | ICD-10-CM | POA: Diagnosis not present

## 2014-08-06 DIAGNOSIS — I872 Venous insufficiency (chronic) (peripheral): Secondary | ICD-10-CM | POA: Diagnosis not present

## 2014-08-06 DIAGNOSIS — I83209 Varicose veins of unspecified lower extremity with both ulcer of unspecified site and inflammation: Secondary | ICD-10-CM | POA: Diagnosis not present

## 2014-08-06 DIAGNOSIS — C4491 Basal cell carcinoma of skin, unspecified: Secondary | ICD-10-CM | POA: Diagnosis not present

## 2014-08-06 DIAGNOSIS — A419 Sepsis, unspecified organism: Secondary | ICD-10-CM | POA: Diagnosis not present

## 2014-08-06 DIAGNOSIS — E785 Hyperlipidemia, unspecified: Secondary | ICD-10-CM | POA: Diagnosis not present

## 2014-08-06 DIAGNOSIS — I70244 Atherosclerosis of native arteries of left leg with ulceration of heel and midfoot: Secondary | ICD-10-CM | POA: Diagnosis not present

## 2014-08-06 DIAGNOSIS — E119 Type 2 diabetes mellitus without complications: Secondary | ICD-10-CM | POA: Diagnosis not present

## 2014-08-06 DIAGNOSIS — M79605 Pain in left leg: Secondary | ICD-10-CM | POA: Diagnosis not present

## 2014-08-06 DIAGNOSIS — Z79899 Other long term (current) drug therapy: Secondary | ICD-10-CM | POA: Diagnosis not present

## 2014-08-06 DIAGNOSIS — E780 Pure hypercholesterolemia: Secondary | ICD-10-CM | POA: Diagnosis not present

## 2014-08-06 DIAGNOSIS — I119 Hypertensive heart disease without heart failure: Secondary | ICD-10-CM | POA: Diagnosis not present

## 2014-08-06 DIAGNOSIS — M7989 Other specified soft tissue disorders: Secondary | ICD-10-CM | POA: Diagnosis not present

## 2014-08-06 DIAGNOSIS — L97209 Non-pressure chronic ulcer of unspecified calf with unspecified severity: Secondary | ICD-10-CM | POA: Diagnosis not present

## 2014-08-06 DIAGNOSIS — D649 Anemia, unspecified: Secondary | ICD-10-CM | POA: Diagnosis not present

## 2014-08-06 DIAGNOSIS — R197 Diarrhea, unspecified: Secondary | ICD-10-CM | POA: Diagnosis not present

## 2014-08-06 DIAGNOSIS — R52 Pain, unspecified: Secondary | ICD-10-CM | POA: Diagnosis not present

## 2014-08-06 DIAGNOSIS — R32 Unspecified urinary incontinence: Secondary | ICD-10-CM | POA: Diagnosis not present

## 2014-08-06 DIAGNOSIS — G8929 Other chronic pain: Secondary | ICD-10-CM | POA: Diagnosis not present

## 2014-08-06 DIAGNOSIS — Z794 Long term (current) use of insulin: Secondary | ICD-10-CM | POA: Diagnosis not present

## 2014-08-06 DIAGNOSIS — L98499 Non-pressure chronic ulcer of skin of other sites with unspecified severity: Secondary | ICD-10-CM | POA: Diagnosis not present

## 2014-08-06 DIAGNOSIS — T148 Other injury of unspecified body region: Secondary | ICD-10-CM | POA: Diagnosis not present

## 2014-08-06 DIAGNOSIS — D070 Carcinoma in situ of endometrium: Secondary | ICD-10-CM | POA: Diagnosis not present

## 2014-08-06 DIAGNOSIS — I70248 Atherosclerosis of native arteries of left leg with ulceration of other part of lower left leg: Secondary | ICD-10-CM | POA: Diagnosis not present

## 2014-08-06 DIAGNOSIS — R630 Anorexia: Secondary | ICD-10-CM | POA: Diagnosis not present

## 2014-08-06 DIAGNOSIS — J9601 Acute respiratory failure with hypoxia: Secondary | ICD-10-CM | POA: Diagnosis not present

## 2014-08-06 DIAGNOSIS — R401 Stupor: Secondary | ICD-10-CM | POA: Diagnosis not present

## 2014-08-06 DIAGNOSIS — I70262 Atherosclerosis of native arteries of extremities with gangrene, left leg: Secondary | ICD-10-CM | POA: Diagnosis not present

## 2014-08-07 ENCOUNTER — Ambulatory Visit
Admit: 2014-08-07 | Disposition: A | Discharge status: Home / Self Care | Payer: Self-pay | Attending: Internal Medicine | Admitting: Internal Medicine

## 2014-08-08 DIAGNOSIS — G8929 Other chronic pain: Secondary | ICD-10-CM | POA: Diagnosis not present

## 2014-08-08 DIAGNOSIS — Z79899 Other long term (current) drug therapy: Secondary | ICD-10-CM | POA: Diagnosis not present

## 2014-08-08 DIAGNOSIS — R109 Unspecified abdominal pain: Secondary | ICD-10-CM | POA: Diagnosis not present

## 2014-08-08 DIAGNOSIS — C775 Secondary and unspecified malignant neoplasm of intrapelvic lymph nodes: Secondary | ICD-10-CM | POA: Diagnosis not present

## 2014-08-08 DIAGNOSIS — K589 Irritable bowel syndrome without diarrhea: Secondary | ICD-10-CM | POA: Diagnosis not present

## 2014-08-08 DIAGNOSIS — M25552 Pain in left hip: Secondary | ICD-10-CM | POA: Diagnosis not present

## 2014-08-08 DIAGNOSIS — R918 Other nonspecific abnormal finding of lung field: Secondary | ICD-10-CM | POA: Diagnosis not present

## 2014-08-08 DIAGNOSIS — R630 Anorexia: Secondary | ICD-10-CM | POA: Diagnosis not present

## 2014-08-08 DIAGNOSIS — M545 Low back pain: Secondary | ICD-10-CM | POA: Diagnosis not present

## 2014-08-08 DIAGNOSIS — Z7901 Long term (current) use of anticoagulants: Secondary | ICD-10-CM | POA: Diagnosis not present

## 2014-08-08 DIAGNOSIS — I10 Essential (primary) hypertension: Secondary | ICD-10-CM | POA: Diagnosis not present

## 2014-08-08 DIAGNOSIS — Z794 Long term (current) use of insulin: Secondary | ICD-10-CM | POA: Diagnosis not present

## 2014-08-08 DIAGNOSIS — M7989 Other specified soft tissue disorders: Secondary | ICD-10-CM | POA: Diagnosis not present

## 2014-08-08 DIAGNOSIS — E119 Type 2 diabetes mellitus without complications: Secondary | ICD-10-CM | POA: Diagnosis not present

## 2014-08-08 DIAGNOSIS — T148 Other injury of unspecified body region: Secondary | ICD-10-CM | POA: Diagnosis not present

## 2014-08-08 DIAGNOSIS — N189 Chronic kidney disease, unspecified: Secondary | ICD-10-CM | POA: Diagnosis not present

## 2014-08-08 DIAGNOSIS — E780 Pure hypercholesterolemia: Secondary | ICD-10-CM | POA: Diagnosis not present

## 2014-08-08 DIAGNOSIS — R197 Diarrhea, unspecified: Secondary | ICD-10-CM | POA: Diagnosis not present

## 2014-08-08 DIAGNOSIS — R32 Unspecified urinary incontinence: Secondary | ICD-10-CM | POA: Diagnosis not present

## 2014-08-08 DIAGNOSIS — C55 Malignant neoplasm of uterus, part unspecified: Secondary | ICD-10-CM | POA: Diagnosis not present

## 2014-08-08 DIAGNOSIS — K769 Liver disease, unspecified: Secondary | ICD-10-CM | POA: Diagnosis not present

## 2014-08-08 DIAGNOSIS — R971 Elevated cancer antigen 125 [CA 125]: Secondary | ICD-10-CM | POA: Diagnosis not present

## 2014-08-08 DIAGNOSIS — M79605 Pain in left leg: Secondary | ICD-10-CM | POA: Diagnosis not present

## 2014-08-08 DIAGNOSIS — K219 Gastro-esophageal reflux disease without esophagitis: Secondary | ICD-10-CM | POA: Diagnosis not present

## 2014-08-08 DIAGNOSIS — M79604 Pain in right leg: Secondary | ICD-10-CM | POA: Diagnosis not present

## 2014-08-09 DIAGNOSIS — E784 Other hyperlipidemia: Secondary | ICD-10-CM | POA: Diagnosis not present

## 2014-08-09 DIAGNOSIS — I509 Heart failure, unspecified: Secondary | ICD-10-CM | POA: Diagnosis not present

## 2014-08-09 DIAGNOSIS — E119 Type 2 diabetes mellitus without complications: Secondary | ICD-10-CM | POA: Diagnosis not present

## 2014-08-09 DIAGNOSIS — I10 Essential (primary) hypertension: Secondary | ICD-10-CM | POA: Diagnosis not present

## 2014-08-10 DIAGNOSIS — L89150 Pressure ulcer of sacral region, unstageable: Secondary | ICD-10-CM | POA: Diagnosis not present

## 2014-08-16 ENCOUNTER — Emergency Department: Payer: Self-pay | Admitting: Emergency Medicine

## 2014-08-16 DIAGNOSIS — I509 Heart failure, unspecified: Secondary | ICD-10-CM | POA: Diagnosis not present

## 2014-08-16 DIAGNOSIS — D0910 Carcinoma in situ of unspecified urinary organ: Secondary | ICD-10-CM | POA: Diagnosis not present

## 2014-08-16 DIAGNOSIS — I10 Essential (primary) hypertension: Secondary | ICD-10-CM | POA: Diagnosis not present

## 2014-08-16 DIAGNOSIS — E11649 Type 2 diabetes mellitus with hypoglycemia without coma: Secondary | ICD-10-CM | POA: Diagnosis not present

## 2014-08-16 DIAGNOSIS — I2782 Chronic pulmonary embolism: Secondary | ICD-10-CM | POA: Diagnosis not present

## 2014-08-22 DIAGNOSIS — L97209 Non-pressure chronic ulcer of unspecified calf with unspecified severity: Secondary | ICD-10-CM | POA: Diagnosis not present

## 2014-08-23 DIAGNOSIS — E119 Type 2 diabetes mellitus without complications: Secondary | ICD-10-CM | POA: Diagnosis not present

## 2014-08-23 DIAGNOSIS — I890 Lymphedema, not elsewhere classified: Secondary | ICD-10-CM | POA: Diagnosis not present

## 2014-08-23 DIAGNOSIS — E784 Other hyperlipidemia: Secondary | ICD-10-CM | POA: Diagnosis not present

## 2014-08-23 DIAGNOSIS — L97309 Non-pressure chronic ulcer of unspecified ankle with unspecified severity: Secondary | ICD-10-CM | POA: Diagnosis not present

## 2014-08-23 DIAGNOSIS — G933 Postviral fatigue syndrome: Secondary | ICD-10-CM | POA: Diagnosis not present

## 2014-08-23 DIAGNOSIS — I83209 Varicose veins of unspecified lower extremity with both ulcer of unspecified site and inflammation: Secondary | ICD-10-CM | POA: Diagnosis not present

## 2014-08-23 DIAGNOSIS — I70262 Atherosclerosis of native arteries of extremities with gangrene, left leg: Secondary | ICD-10-CM | POA: Diagnosis not present

## 2014-08-23 DIAGNOSIS — I509 Heart failure, unspecified: Secondary | ICD-10-CM | POA: Diagnosis not present

## 2014-08-23 DIAGNOSIS — I872 Venous insufficiency (chronic) (peripheral): Secondary | ICD-10-CM | POA: Diagnosis not present

## 2014-08-24 DIAGNOSIS — L97119 Non-pressure chronic ulcer of right thigh with unspecified severity: Secondary | ICD-10-CM | POA: Diagnosis not present

## 2014-08-24 DIAGNOSIS — I70248 Atherosclerosis of native arteries of left leg with ulceration of other part of lower left leg: Secondary | ICD-10-CM | POA: Diagnosis not present

## 2014-08-29 DIAGNOSIS — E10331 Type 1 diabetes mellitus with moderate nonproliferative diabetic retinopathy with macular edema: Secondary | ICD-10-CM | POA: Diagnosis not present

## 2014-08-31 DIAGNOSIS — I70248 Atherosclerosis of native arteries of left leg with ulceration of other part of lower left leg: Secondary | ICD-10-CM | POA: Diagnosis not present

## 2014-08-31 DIAGNOSIS — R3914 Feeling of incomplete bladder emptying: Secondary | ICD-10-CM | POA: Diagnosis not present

## 2014-08-31 DIAGNOSIS — K589 Irritable bowel syndrome without diarrhea: Secondary | ICD-10-CM | POA: Diagnosis not present

## 2014-08-31 DIAGNOSIS — L97119 Non-pressure chronic ulcer of right thigh with unspecified severity: Secondary | ICD-10-CM | POA: Diagnosis not present

## 2014-09-01 DIAGNOSIS — N390 Urinary tract infection, site not specified: Secondary | ICD-10-CM | POA: Diagnosis not present

## 2014-09-01 DIAGNOSIS — I739 Peripheral vascular disease, unspecified: Secondary | ICD-10-CM | POA: Diagnosis not present

## 2014-09-01 DIAGNOSIS — I10 Essential (primary) hypertension: Secondary | ICD-10-CM | POA: Diagnosis not present

## 2014-09-01 DIAGNOSIS — E784 Other hyperlipidemia: Secondary | ICD-10-CM | POA: Diagnosis not present

## 2014-09-04 ENCOUNTER — Ambulatory Visit: Payer: Self-pay | Admitting: Vascular Surgery

## 2014-09-04 DIAGNOSIS — L97209 Non-pressure chronic ulcer of unspecified calf with unspecified severity: Secondary | ICD-10-CM | POA: Diagnosis not present

## 2014-09-04 DIAGNOSIS — I10 Essential (primary) hypertension: Secondary | ICD-10-CM | POA: Diagnosis not present

## 2014-09-04 DIAGNOSIS — Z794 Long term (current) use of insulin: Secondary | ICD-10-CM | POA: Diagnosis not present

## 2014-09-04 DIAGNOSIS — E119 Type 2 diabetes mellitus without complications: Secondary | ICD-10-CM | POA: Diagnosis not present

## 2014-09-04 DIAGNOSIS — Z7901 Long term (current) use of anticoagulants: Secondary | ICD-10-CM | POA: Diagnosis not present

## 2014-09-04 DIAGNOSIS — E785 Hyperlipidemia, unspecified: Secondary | ICD-10-CM | POA: Diagnosis not present

## 2014-09-04 DIAGNOSIS — Z79899 Other long term (current) drug therapy: Secondary | ICD-10-CM | POA: Diagnosis not present

## 2014-09-04 DIAGNOSIS — I70222 Atherosclerosis of native arteries of extremities with rest pain, left leg: Secondary | ICD-10-CM | POA: Diagnosis not present

## 2014-09-04 DIAGNOSIS — L97529 Non-pressure chronic ulcer of other part of left foot with unspecified severity: Secondary | ICD-10-CM | POA: Diagnosis not present

## 2014-09-04 LAB — CREATININE, SERUM
CREATININE: 1.27 mg/dL — AB
EGFR (African American): 47 — ABNORMAL LOW
EGFR (Non-African Amer.): 41 — ABNORMAL LOW

## 2014-09-04 LAB — PROTIME-INR
INR: 1.6
PROTHROMBIN TIME: 19 s — AB

## 2014-09-04 LAB — BUN: BUN: 27 mg/dL — AB

## 2014-09-05 DIAGNOSIS — K589 Irritable bowel syndrome without diarrhea: Secondary | ICD-10-CM | POA: Diagnosis not present

## 2014-09-05 DIAGNOSIS — E780 Pure hypercholesterolemia: Secondary | ICD-10-CM | POA: Diagnosis not present

## 2014-09-05 DIAGNOSIS — M25552 Pain in left hip: Secondary | ICD-10-CM | POA: Diagnosis not present

## 2014-09-05 DIAGNOSIS — E119 Type 2 diabetes mellitus without complications: Secondary | ICD-10-CM | POA: Diagnosis not present

## 2014-09-05 DIAGNOSIS — M79605 Pain in left leg: Secondary | ICD-10-CM | POA: Diagnosis not present

## 2014-09-05 DIAGNOSIS — R918 Other nonspecific abnormal finding of lung field: Secondary | ICD-10-CM | POA: Diagnosis not present

## 2014-09-05 DIAGNOSIS — K219 Gastro-esophageal reflux disease without esophagitis: Secondary | ICD-10-CM | POA: Diagnosis not present

## 2014-09-05 DIAGNOSIS — Z7901 Long term (current) use of anticoagulants: Secondary | ICD-10-CM | POA: Diagnosis not present

## 2014-09-05 DIAGNOSIS — G8929 Other chronic pain: Secondary | ICD-10-CM | POA: Diagnosis not present

## 2014-09-05 DIAGNOSIS — Z79899 Other long term (current) drug therapy: Secondary | ICD-10-CM | POA: Diagnosis not present

## 2014-09-05 DIAGNOSIS — C775 Secondary and unspecified malignant neoplasm of intrapelvic lymph nodes: Secondary | ICD-10-CM | POA: Diagnosis not present

## 2014-09-05 DIAGNOSIS — I10 Essential (primary) hypertension: Secondary | ICD-10-CM | POA: Diagnosis not present

## 2014-09-05 DIAGNOSIS — M7989 Other specified soft tissue disorders: Secondary | ICD-10-CM | POA: Diagnosis not present

## 2014-09-05 DIAGNOSIS — T148 Other injury of unspecified body region: Secondary | ICD-10-CM | POA: Diagnosis not present

## 2014-09-05 DIAGNOSIS — R109 Unspecified abdominal pain: Secondary | ICD-10-CM | POA: Diagnosis not present

## 2014-09-05 DIAGNOSIS — N189 Chronic kidney disease, unspecified: Secondary | ICD-10-CM | POA: Diagnosis not present

## 2014-09-05 DIAGNOSIS — K769 Liver disease, unspecified: Secondary | ICD-10-CM | POA: Diagnosis not present

## 2014-09-05 DIAGNOSIS — C55 Malignant neoplasm of uterus, part unspecified: Secondary | ICD-10-CM | POA: Diagnosis not present

## 2014-09-05 DIAGNOSIS — M545 Low back pain: Secondary | ICD-10-CM | POA: Diagnosis not present

## 2014-09-05 DIAGNOSIS — R971 Elevated cancer antigen 125 [CA 125]: Secondary | ICD-10-CM | POA: Diagnosis not present

## 2014-09-05 DIAGNOSIS — R630 Anorexia: Secondary | ICD-10-CM | POA: Diagnosis not present

## 2014-09-05 DIAGNOSIS — R197 Diarrhea, unspecified: Secondary | ICD-10-CM | POA: Diagnosis not present

## 2014-09-05 DIAGNOSIS — R32 Unspecified urinary incontinence: Secondary | ICD-10-CM | POA: Diagnosis not present

## 2014-09-05 DIAGNOSIS — Z794 Long term (current) use of insulin: Secondary | ICD-10-CM | POA: Diagnosis not present

## 2014-09-05 DIAGNOSIS — M79604 Pain in right leg: Secondary | ICD-10-CM | POA: Diagnosis not present

## 2014-09-05 LAB — CBC CANCER CENTER
BASOS ABS: 0.1 x10 3/mm (ref 0.0–0.1)
BASOS PCT: 0.7 %
EOS PCT: 0.6 %
Eosinophil #: 0.1 x10 3/mm (ref 0.0–0.7)
HCT: 28 % — ABNORMAL LOW (ref 35.0–47.0)
HGB: 8.7 g/dL — AB (ref 12.0–16.0)
LYMPHS ABS: 2.5 x10 3/mm (ref 1.0–3.6)
LYMPHS PCT: 21.1 %
MCH: 24.9 pg — ABNORMAL LOW (ref 26.0–34.0)
MCHC: 31 g/dL — ABNORMAL LOW (ref 32.0–36.0)
MCV: 80 fL (ref 80–100)
MONOS PCT: 9.9 %
Monocyte #: 1.2 x10 3/mm — ABNORMAL HIGH (ref 0.2–0.9)
Neutrophil #: 7.9 x10 3/mm — ABNORMAL HIGH (ref 1.4–6.5)
Neutrophil %: 67.7 %
Platelet: 441 x10 3/mm — ABNORMAL HIGH (ref 150–440)
RBC: 3.48 10*6/uL — ABNORMAL LOW (ref 3.80–5.20)
RDW: 19.5 % — ABNORMAL HIGH (ref 11.5–14.5)
WBC: 11.7 x10 3/mm — AB (ref 3.6–11.0)

## 2014-09-05 LAB — PROTIME-INR
INR: 1.5
Prothrombin Time: 18 secs — ABNORMAL HIGH

## 2014-09-05 LAB — CREATININE, SERUM
Creatinine: 1.34 mg/dL — ABNORMAL HIGH
EGFR (African American): 44 — ABNORMAL LOW
EGFR (Non-African Amer.): 38 — ABNORMAL LOW

## 2014-09-06 LAB — CA 125: CA 125: 4187 U/mL — ABNORMAL HIGH (ref 0.0–34.0)

## 2014-09-07 ENCOUNTER — Ambulatory Visit
Admit: 2014-09-07 | Disposition: A | Discharge status: Home / Self Care | Payer: Self-pay | Attending: Internal Medicine | Admitting: Internal Medicine

## 2014-09-07 ENCOUNTER — Encounter: Payer: Self-pay | Admitting: Surgery

## 2014-09-09 DIAGNOSIS — I509 Heart failure, unspecified: Secondary | ICD-10-CM | POA: Diagnosis not present

## 2014-09-09 DIAGNOSIS — D649 Anemia, unspecified: Secondary | ICD-10-CM | POA: Diagnosis not present

## 2014-09-09 DIAGNOSIS — E119 Type 2 diabetes mellitus without complications: Secondary | ICD-10-CM | POA: Diagnosis not present

## 2014-09-09 DIAGNOSIS — G933 Postviral fatigue syndrome: Secondary | ICD-10-CM | POA: Diagnosis not present

## 2014-09-14 DIAGNOSIS — L89150 Pressure ulcer of sacral region, unstageable: Secondary | ICD-10-CM | POA: Diagnosis not present

## 2014-09-14 DIAGNOSIS — L98499 Non-pressure chronic ulcer of skin of other sites with unspecified severity: Secondary | ICD-10-CM | POA: Diagnosis not present

## 2014-09-20 DIAGNOSIS — I739 Peripheral vascular disease, unspecified: Secondary | ICD-10-CM | POA: Diagnosis not present

## 2014-09-20 DIAGNOSIS — I509 Heart failure, unspecified: Secondary | ICD-10-CM | POA: Diagnosis not present

## 2014-09-20 DIAGNOSIS — I10 Essential (primary) hypertension: Secondary | ICD-10-CM | POA: Diagnosis not present

## 2014-09-20 DIAGNOSIS — E119 Type 2 diabetes mellitus without complications: Secondary | ICD-10-CM | POA: Diagnosis not present

## 2014-09-21 DIAGNOSIS — L89150 Pressure ulcer of sacral region, unstageable: Secondary | ICD-10-CM | POA: Diagnosis not present

## 2014-09-21 DIAGNOSIS — L89620 Pressure ulcer of left heel, unstageable: Secondary | ICD-10-CM | POA: Diagnosis not present

## 2014-09-28 DIAGNOSIS — I70248 Atherosclerosis of native arteries of left leg with ulceration of other part of lower left leg: Secondary | ICD-10-CM | POA: Diagnosis not present

## 2014-09-28 DIAGNOSIS — L89150 Pressure ulcer of sacral region, unstageable: Secondary | ICD-10-CM | POA: Diagnosis not present

## 2014-09-28 NOTE — H&P (Signed)
PATIENT NAME:  Shelley Fields, Shelley Fields MR#:  [id number] DATE OF BIRTH:  06-Feb-1936  DATE OF ADMISSION:  09/09/2012  HISTORY OF PRESENT ILLNESS:  [name] is a 79 year old patient well known to me. She was recently seen March 31st. She was seen in the clinic today and then hospitalization, she came in with right abdominal, right back, right flank pain, fever of 101 and spiked with chills to 102.5. She has no dysuria but the UA was grossly abnormal with positive esterase, white cells and red cells suggesting pyelonephritis.  ADDITIONAL REVIEW OF SYSTEMS:  Had not had headache or dizziness or recent orthostasis. No cough, wheezing or hemoptysis. No palpitations or retrosternal chest pain. No ear or jaw pain. No flulike symptoms. No abdominal pain except for right mid abdomen and flank pain just recently. No diarrhea. No dysuria or hematuria. No increase in extremity edema. No increased bone pain. She has had some waxing and waning back pain. No focal weakness. No rash or bruising.   PAST MEDICAL HISTORY:  Notable for recurrent carcinoma of the endometrium and has been followed clinically and with serial CEA 125. Is on Coumadin for previous pulmonary embolism and has had MRSA infections in the past. Also radiation and chemotherapy for vaginal cancer and recurrent C. difficile and diarrhea in the past. Prior cardiac arrest and asystole. Has had recurrent UTIs and has been on suppressive antibiotics. Also includes hyperlipidemia, diabetes and hypercholesterolemia.   FAMILY HISTORY: Noncontributory.   SOCIAL HISTORY: Noncontributory.  ALLERGIES: No known drug allergies.   MEDICATIONS:  1.  Magnesium oxide 400 mg twice a day.  2.  Coumadin 1.5 mg daily.  3.  Lovastatin 20 mg at night. 4.  Flomax 0.4 mg twice a day. 5.  PreserVision capsules twice a day. 6.  Omeprazole 20 mg daily. 7.  Atenolol 100 mg daily. 8.  Clonidine 0.2 mg twice a day. 9.  Nifedipine 30 mg extended-release once a day.  10.  Prior had  been on Lortab 7.5 mg/500 mg every 6 hours p.r.n. for pain. 11.  Doxycycline hyclate 100 mg orally once a day.  12.  Meclizine 25 mg 3 times a day p.r.n.  13.  Novolin 70/30 insulin most recently 20 units in the evening and 60 units in the morning.  14.  Probiotic capsules twice a day.   PHYSICAL EXAMINATION: GENERAL: Was alert and cooperative. Was not in acute distress.  HEENT: Sclerae were clear. No jaundice. Some pallor. No thrush.  NECK: No mass. LYMPH: No lymphadenopathy in the neck, supraclavicular, submandibular and axilla.  LUNGS: Clear. No wheezing, rales or rhonchi.  ABDOMEN: Nontender. No palpable mass or organomegaly.  SKIN:  There is an old midline, nonhealing wound with skin blistering that is stable.  EXTREMITIES:  Trace ankle and pretibial edema bilaterally.  NEUROLOGIC: Grossly nonfocal. Cranial nerves are intact.  Was currently sitting, did not check the gait. No gross focal weakness.   LABORATORY DATA:  Urinalysis was grossly abnormal, but culture was pending. Chemistries were pending.   IMPRESSION: The patient is with underlying endometrial cancer that is recurrent and slowly progressive, but asymptomatic. Has had a rising CA-125 and has been followed with plans to treat on a p.r.n. basis. Has some multiple old issues including recurrent urinary infections, on suppression, has had Clostridium difficile,  recurrent diarrhea, has had MRSA, had urine infections including with enterococcus. Had flank pain for about 3 days. Was unaware of fever or chills. Did not call to anyone's attention until today and  has continued back pain with spiking fevers, abnormal urine, then diagnosis of pyelonephritis.   PLAN:  Admit. Continue chronic medicines. Watch vital signs. Watch electrolytes. Document the outstanding chemistries. Correct any dehydration. Start antibiotics with ceftriaxone and also      added vancomycin given history. Pharmacy to consult to dose vancomycin. Urine cultures  done in the clinic, blood cultures done from the Port-A-Cath. Correct any electrolyte abnormalities. ____________________________ Simonne Come Inez Pilgrim, MD rgg:sb D: 09/09/2012 13:32:59 ET T: 09/09/2012 13:59:39 ET JOB#: [id number]  cc: Simonne Come. Inez Pilgrim, MD, <Dictator> Dallas Schimke MD ELECTRONICALLY SIGNED 09/11/2012 10:45

## 2014-09-28 NOTE — Discharge Summary (Signed)
PATIENT NAME:  Shelley Fields, Shelley Fields MR#:  [id number] DATE OF BIRTH:  10/27/1935  DATE OF ADMISSION:  09/09/2012 DATE OF DISCHARGE:  09/15/2012  FINAL DIAGNOSES:  1.  Underlying endometrial cancer.  2.  Proteus urinary tract infection. 3.  Sacral abscess with Proteus enterococcus growing from the cultures. 4.  Fever attributed to urinary tract infection.  5.  Diabetes.  6.  Chronic renal failure. 7.  Hypoxia with interstitial changes due to fluid retention, improved with Lasix.   HISTORY AND PHYSICAL: Dictated on admission.   HOSPITAL COURSE:  The patient, as stated, was initially admitted and given intravenous antibiotics with ceftriaxone plus vancomycin. Blood and urine cultures were done. An abscess was noted, vancomycin was continued, surgery was consulted, infectious disease was consulted. Hypoxia was noted, Lasix was given, fluids were adjusted, x-ray was followed. The patient had general weakness, but with treatment of infection became stronger and more mobile but still needed physical therapy and still leg weakness so that additional therapy was planned at discharge. Sugars were monitored with sliding scale insulin. At the time of discharge, the patient was stable. She was sent home with antibiotics switched to oral and also recommended for home health and physical therapy. An appointment is made in the [facility] for Monday, April 14th. She was put on ADA, carbohydrate-controlled diet. She was on a dry dressing for the sacral wound.   DISCHARGE MEDICATIONS: 1.  Lovastatin 20 mg once a day at night. 2.  Flomax 0.4 mg 1 twice a day. 3.  Omeprazole 20 mg in the morning. 4.  Atenolol 100 mg in the morning. 5.  Clonidine 0.2 mg twice a day. 6.  Lortab 7.5/500 mg 1 every 6 hours p.r.n. for pain. 7.  Nifedipine extended-release 30 mg once a day.  8.  Magnesium oxide 400 mg 2 tablets twice a day. 9.  Probiotic capsule twice a day. 10.  PreserVision 1 tablet twice a day. 11.  Meclizine 25 mg  3 times a day p.r.n. for vertigo. 12.  Novolin 70/30 20 units sub-Q in the morning and 20 in the evening.  13.  Phenergan 1 tablet 25 mg every 6 hours p.r.n. for nausea.  14.  Augmentin 875 mg 1 tablet oral twice a day.  ____________________________ Simonne Come. Inez Pilgrim, MD rgg:sb D: 09/29/2012 09:43:27 ET T: 09/29/2012 10:03:05 ET JOB#: [id number]  cc: Simonne Come. Inez Pilgrim, MD, <Dictator> Dallas Schimke MD ELECTRONICALLY SIGNED 10/19/2012 20:25

## 2014-09-28 NOTE — Consult Note (Signed)
PATIENT NAME:  Shelley Fields, SHIFRIN MR#:  [id number] DATE OF BIRTH:  1935/12/20  NEW INFECTIOUS DISEASES VISIT  DATE OF CONSULTATION:  09/12/2012  REFERRING PHYSICIAN: Dr. Inez Pilgrim.    CONSULTING PHYSICIAN: [name]. Emersen Mascari, M.D.   REASONS FOR CONSULTATION: UTI, abscess, and bacteremia.   HISTORY OF PRESENT ILLNESS: The patient is a 79 year old female with a past history significant for endometrial cancer, diabetes, C. diff colitis and recurrent UTIs who was seen in the [facility] on April 4th complaining of back pain and fever. She had a temperature up to 102.5. She had not been having any specific dysuria or increased frequency, but a urinalysis showed significant leukocyte esterase. She was admitted to the hospital and urine cultures have been positive for a Proteus species. Blood cultures have also been positive for coagulase-negative staph. In addition, she was found to have a draining abscess in the sacral area. Surgery has seen her and does not feel that further I and D is needed at this point. A culture has been obtained and is currently pending. The patient was started on vancomycin and ceftriaxone. Amoxicillin was added for unclear reasons. She is currently very short of breath. She denies any significant sputum production, but has had some occasional cough. She is currently on oxygen, which she is not on at home. She has had fevers and chills at home. She has had no nausea or vomiting, no significant abdominal pain, no change in her bowels. She has not had any rashes other than the drainage from her sacral area.   ALLERGIES: None.   PAST MEDICAL HISTORY: 1.  Diabetes.  2.  C. difficile colitis.  3.  Endometrial cancer, status post radiation and chemotherapy.  4.  Cardiac arrest.  5.  Recurrent UTIs, on chronic doxycycline.  6.  Hypercholesterolemia.   FAMILY HISTORY: Negative for diabetes or hypertension.   SOCIAL HISTORY: The patient lives by herself. She has no pets. She does not  smoke. She does not drink.   REVIEW OF SYSTEMS:  GENERAL: Positive fevers, chills, malaise, and weakness. No significant sweats.  HEENT: No headaches. No sinus congestion. No sore throat.  NECK: No stiffness. No swollen glands.  RESPIRATORY: Positive nonproductive cough with shortness of breath, which began since being in the hospital.  CARDIAC: No chest pains or palpitations. No peripheral edema.  GASTROINTESTINAL: No nausea, no vomiting, no abdominal pain, no change in her bowels.  GENITOURINARY: No dysuria. No increased frequency. No hematuria. She has had some bilateral flank pain that is better since being in the hospital.  MUSCULOSKELETAL: No complaints other than generalized weakness.  SKIN: She has had some drainage from the sacral area, which the nurses have said is getting better.  NEUROLOGIC: No focal weakness.  PSYCHIATRIC: No complaints.   All other systems are negative.   PHYSICAL EXAMINATION: VITAL SIGNS: T-max of 102.6, T-current 98.0, pulse 68, blood pressure 159/63, 98% on 2 liters.  GENERAL: A 79 year old white female, obese, in no acute distress.  HEENT: Normocephalic, atraumatic. Pupils equal and reactive to light. Extraocular motion intact. Sclerae, conjunctivae, and lids are without evidence for emboli or petechiae. Oropharynx shows no erythema or exudate. Teeth and gums are in fair condition.  NECK: Supple. Full range of motion. Midline trachea. No lymphadenopathy. No thyromegaly.  LUNGS: Clear to auscultation bilaterally. She is moderately tachypneic; however, she was moving air fairly well and had no signs of obvious consolidation.  CARDIAC EXAM: Regular rate and rhythm without murmur, rub, or gallop.  ABDOMINAL  EXAM: Soft, nontender, and nondistended. No hepatosplenomegaly. No hernia is noted. No CVA tenderness.  MUSCULOSKELETAL: No evidence for tenosynovitis.  SKIN EXAM: No rashes. No stigmata of endocarditis; specifically, no Janeway lesions or Osler nodes.   NEUROLOGIC: The patient is awake and interactive, moving all 4 extremities.  PSYCHIATRIC: Mood and affect appeared normal.   LABORATORY DATA: BUN of 25, creatinine 1.62, bicarbonate 25, anion gap of 7. LFTs were unremarkable. White count of 16.9, with a hemoglobin of 9.4, platelet count of 278, PNC of 13.0. White count on admission was 19.1. Urinalysis had greater than 500 of protein, negative nitrites, 2+ leukocyte esterase, 40 red cells and 603 white cells per high-powered field. Urine culture is growing greater than 100,000 CFUs per milliliter of Proteus that is sensitive to all antibiotics tested except for nitrofurantoin. Blood cultures are growing coagulase-negative staph in 4 out of 4 bottles. A chest x-ray from admission shows increased interstitial markings consistent with interstitial edema. There is no alveolar infiltrate. A repeat chest x-ray from 09/11/2012 showed no significant changes.   IMPRESSION: A 79 year old female with a history of diabetes, endometrial cancer, Clostridium difficile colitis, and recurrent urinary tract infections who was admitted with a Proteus urinary tract infection, sacral abscess, and coagulase-negative staph in the blood.   RECOMMENDATIONS: 1.  She does not have dysuria but did have fever, flank pain, and a positive urinalysis. A culture is growing Proteus. It is fairly sensitive. Her current antibiotics will cover this for now.  2.  Her abscess has spontaneously drained. She has had methicillin-resistant Staphylococcus aureus in the past. We will await the culture results. We will continue the vancomycin until the culture returns.  3. Unclear if the coagulase-negative staph in the blood represents a port infection versus contamination. Given that she has 2 other reasons for her current presentation, I would not specifically treat this at this time. If her symptoms recur and her blood cultures remain positive, we will need to consider a port infection.  4.  It  is unclear to me why she is on amoxicillin. She has had C. diff. in the past. Will stop the amoxicillin.  5. She is currently feeling short of breath. Her chest x-ray showed edema. Will consider Lasix. Will defer to the primary team.  6.  Given her age, will decrease the ceftriaxone dose.   This is a high-level infectious disease consult. Thank you very much involving me in the patient's care.   ____________________________ [name]. Damyan Corne, MD meb:dm D: 09/12/2012 14:35:00 ET T: 09/12/2012 15:03:46 ET JOB#: [id number]  cc: [name]. Yaritsa Savarino, MD, <Dictator> Simonne Come. Inez Pilgrim, MD Legrand Como E Genese Quebedeaux MD ELECTRONICALLY SIGNED 09/12/2012 17:13

## 2014-09-28 NOTE — Consult Note (Signed)
Imrpression:     79yo female w/ h/o DM, endometrial CA, C. diff colitis and recurrent UTIs admitted with Proteus UTI, sacral abscess and CNS in blood.     She did not have dysuria, but did have fever, flank pain and a positive u/a.  Her culture is growing Proteus.  It is fairly sensitive.  Ceftriaxone will cover this.    Her abscess has spontaneously drained.  She has had Methacillin Resistant Staph aureus in the past.  Will await the culture results.  Will continue the vanco until the culture returns.    Unclear if the CNS in the blood represents a port infection vs contamination.  Given that she has two other reasons for her presentation, I would not specifically treat this.  If her symptoms recur and her BCx remain positive, will need to consider port infection.    Unclear why she is on amoxicillin.  She has had C. diff in the past.  Will stop the amoxicillin.    She is currently feeling SOB.  Her CXR has shown edema.  Would consider lasix.  Will defer to primary team.    Given her age, will decrease the ceftriaxone dose.  Electronic Signatures: Calypso Hagarty MPH, [name] (MD)  (Signed on 07-Apr-14 14:26)  Authored  Last Updated: 07-Apr-14 14:26 by Ritik Stavola MPH, [name] (MD)

## 2014-09-28 NOTE — Consult Note (Signed)
PATIENT NAME:  Shelley Fields, Shelley Fields MR#:  [id number] DATE OF BIRTH:  1936/06/05  DATE OF CONSULTATION:  09/10/2012  REFERRING PHYSICIAN:   CONSULTING PHYSICIAN:  Jerrol Banana. Burt Knack, MD  CHIEF COMPLAINT:  Coccygeal abscess.   HISTORY OF PRESENT ILLNESS:  This is a 79 year old patient with recurrent endometrial cancer.  Dr. Inez Pilgrim called and spoke to me personally and requested a consult for what he believed to be a possible abscess in the coccygeal sacral area.  The patient is mostly sedentary, but does get up at home.  For about two weeks she has had swelling in her coccygeal area, had not noticed any drainage.  Denies fevers or chills until just recently.   PAST MEDICAL HISTORY:  Recurrent endometrial carcinoma, anticoagulation for pulmonary embolus, MRSA infections, history of recurrent Clostridium difficile overgrowth and pseudomembranous colitis, cardiac arrest, recurrent urinary tract infections.   FAMILY HISTORY:  Noncontributory.   SOCIAL HISTORY:  The patient does not smoke nor drink.   ALLERGIES:  None.   MEDICATIONS:  Multiple, see chart.  Most importantly Coumadin.   REVIEW OF SYSTEMS:  A 10 system review was performed and negative with the exception of that mentioned in the history of present illness.   PHYSICAL EXAMINATION: GENERAL:  Morbidly obese female patient with a BMI of 39, 199 pounds, 60 inches tall.  VITAL SIGNS:  Temp of 99.6, pulse 76, respirations 20, blood pressure 147/65, 92% room air sat.  HEENT:  Shows no scleral icterus.  Poor dentition.  NECK:  No palpable neck nodes.  CHEST:  Shows bilateral rhonchi.  CARDIAC:  Irregular rate.  ABDOMEN:  Soft and nontender.  EXTREMITIES:  Show moderate edema.  NEUROLOGIC:  Grossly intact.  SKIN:  The patient is rolled over, in the sacral coccygeal area demonstrates an erythematous draining site.  The drain site is approximately 1 cm and is draining purulent material.  It is nontender, nonindurated.   LABORATORY DATA:  White  blood cell count yesterday was 19.1 and has not been repeated.  H and H of 10.2 and 31.5, platelet count of 252.  Electrolytes today were within normal limits, but her creatinine is 1.43.   ASSESSMENT AND PLAN:  This is a patient with a spontaneously draining sacral coccygeal abscess.  This could be a decubitus ulcer, although the patient states that she is fairly mobile at home.  Her creatinine is elevated and her INR is 3.6.  She would be a very difficult incision and drainage at this point, but the fact is it is draining spontaneously.  If this does not improve rapidly on IV antibiotics she may require a trip to the operating room for operative debridement and formal incision and drainage.  This was discussed with she and her husband.  They understood and agreed with this plan.  We will attempt IV antibiotics for a short period of time and if it does not resolve, then further debridement would be indicated.      ____________________________ Jerrol Banana. Burt Knack, MD rec:ea D: 09/10/2012 12:87:86 ET T: 09/10/2012 23:28:17 ET JOB#: [id number]  cc: Jerrol Banana. Burt Knack, MD, <Dictator> Florene Glen MD ELECTRONICALLY SIGNED 09/11/2012 9:31

## 2014-09-28 NOTE — Consult Note (Signed)
Brief Consult Note: Diagnosis: sacral abscess, spontaneously drained.   Patient was seen by consultant.   Consult note dictated.   Recommend further assessment or treatment.   Orders entered.   Discussed with Attending MD.   Comments: spont drained abscess without need for add'l surgical drainage. Pt is fully anticoagulated as well. Rec dressings and IV abx. Will follow and if no improvement, could debride in OR.  Electronic Signatures: Florene Glen (MD)  (Signed 05-Apr-14 13:41)  Authored: Brief Consult Note   Last Updated: 05-Apr-14 13:41 by Florene Glen (MD)

## 2014-09-29 NOTE — Discharge Summary (Signed)
PATIENT NAME:  Shelley Fields, Shelley Fields MR#:  [id number] DATE OF BIRTH:  [birth year]  DATE OF ADMISSION:  07/14/2013 DATE OF DISCHARGE:  07/19/2013  DISCHARGE DIAGNOSES:  1.  Urinary tract infection.  2.  Diarrhea and vomiting with some ileus, possible viral gastroenteritis.  3.  Diabetes.  4.  History of pulmonary embolus, on Coumadin.   CONDITION ON DISCHARGE:  Stable.   CODE STATUS: FULL CODE.  MEDICATIONS ON DISCHRGE:  1.  Lovastatin 20 mg oral tablet once a day.  2.  Flomax 0.4 mg oral capsule 2 times a day.  3.  Omeprazole 20 mg delayed-release capsule once a day.  4.  Clonidine 0.2 mg oral tablet two times a day.  5.  Magnesium oxide 400 mg oral tablet 2 times a day.  6.  Probiotic 2 times a day.  7.  Meclizine 25 mg 3 times a day.  8.  Promethazine 25 mg oral tablet every six hours as needed for nausea.  9.  Coumadin 1 mg oral tablet once a day.  Advised to start after three days.  10.  Novolin 70/30 injection 10 units subcutaneous 2 times a day.  11.  Atenolol 50 mg oral tablet 2 times a day.  12.  Nifedipine 60 mg oral extended-release tablet once a day.  13.  Ceftin 500 mg oral tablet 2 times a day for two days.   Advised to have home health, physical therapy on discharge.  Low-sodium diet was advised with regular consistency.  Advised to follow with primary care physician as a routine, Dr. Sinda Du.    HISTORY OF PRESENT ILLNESS:  A 79 year old female with past medical history of hypertension, hyperlipidemia, diabetes, cervical cancer with surgery and radiation, came to Emergency Room with complaint of multiple episodes of diarrhea since the morning.  She had previous history of C. diff, started on multiple episodes of diarrhea, watery.  Concerning of this, she came to the Emergency Department, found to have urinary tract infection also.  HOSPITAL COURSE AND STAY:  1.  The patient had likely viral enteritis.  We will gave supportive care.  Stool for C. difficile was negative.   Antibiotics was given and it resolved later on.  Later on, the patient actually had some constipation and we had to give suppository to get rid of that.  2.  Urinary tract infection.  We continued on Rocephin.  The patient had improvement.  3.  History of cervical cancer, currently undergoing chemotherapy with oncology.  We advised to continue the same.  4.  History of pulmonary embolism, was on Coumadin, came with therapeutic INR and continued the same.  It went a little high in the hospital so we advised to stop it for two days after discharge and then restart.  5.  Hypertension.  We decreased the dose of atenolol.  Cardiology was called in because the patient at high degree blockage and the patient was also on Coumadin, blood pressure remained stable after that.  6.  Diabetes.  We decreased the dose of insulin as the patient had blood sugar nicely under control with low dose of insulin as there was not good oral intake because of vomiting and diarrhea also.  7.  Right elbow pain.  This was a complaint in the hospital for one day, but most likely it was muscular because the x-ray was negative and the next day the patient was feeling fine.   CONSULTATIONS IN THE HOSPITAL:  Cardiology, Dr. Ubaldo Glassing.   IMPORTANT LABORATORY  RESULTS:  C. difficile was negative.  WBC count 12, hemoglobin 11.6, platelet count 310, creatinine was 1.81 on admission.  Sodium was 137 and potassium was 4.3.  WBC count was more than 30 and 3+ leukocyte esterase on admission on UA.  Urine culture was not very informative.  CT scan of abdomen and pelvis without contrast:  No finding to explain the patient's acute symptoms, ureters are mildly dilated.  INR was 2.8.  Later INR went up to 3.1 on the 10th of February and 3.4 on the 11th of February.  Total time spent in this discharge 35 minutes.    ____________________________ Shelley Fields Shelley Jungling, MD vgv:ea D: 07/23/2013 03:15:22 ET T: 07/23/2013 07:10:13  ET JOB#: [id number]  cc: Shelley Fields. Shelley Jungling, MD, <Dictator> Vaughan Basta MD ELECTRONICALLY SIGNED 07/29/2013 8:45

## 2014-09-29 NOTE — Consult Note (Signed)
   Present Illness 79 yo female with history of hypertension, hyperlipidemia, diabetes admitted with nausea and vomiting. Was diagnosed with a uti. Last pm while vomiting, patient was noted to have high grade heart block on telemetry. Pts rate is normal sinus rhythm other wise. She denies history of syncope or presyncope. Denies chest pain but continues to vomit as soon as she eats. She is on clonidine and atenolol for her blood pressure.   Physical Exam:  GEN obese   HEENT PERRL, moist oral mucosa   NECK No masses   RESP normal resp effort   CARD Regular rate and rhythm  Normal, S1, S2   ABD denies tenderness  normal BS   LYMPH negative axillae   EXTR negative edema   SKIN normal to palpation   NEURO cranial nerves intact, motor/sensory function intact   PSYCH A+O to time, place, person   Review of Systems:  Subjective/Chief Complaint nausea and vomiting   General: Fatigue  Weakness   Skin: No Complaints   ENT: No Complaints   Eyes: No Complaints   Neck: No Complaints   Respiratory: No Complaints   Cardiovascular: No Complaints   Gastrointestinal: Nausea   Genitourinary: No Complaints   Vascular: No Complaints   Musculoskeletal: No Complaints   Neurologic: No Complaints   Hematologic: No Complaints   Endocrine: No Complaints   Psychiatric: No Complaints   Review of Systems: All other systems were reviewed and found to be negative   Medications/Allergies Reviewed Medications/Allergies reviewed   EKG:  EKG NSR   Abnormal NSSTTW changes    No Known Allergies:    Impression Pt with history of hypertension, diabetea and hyperlipidemia who was admitted with nausea and vomiting and diagnosed with a uti. SHe has persistant nausea. During an episode of vomiting last pm, was noted to have high grade heart block. Rhythm resolved after the vomiting event. Appears to be seocndary to increased vagal tone with the vomiting. Shw is on atenolol and clonidine  for her blood pressure. This is likely exacerbating her rhythm. Will decrease atenolol to 50 mg daily from 100 and followl   Plan 1. Reduce atenolol to 50 mg daily 2. Follow on telemetry 3. Continue to treat nausea and vomiting and uti.  4. Further recs pending course.   Electronic Signatures: Teodoro Spray (MD)  (Signed 09-Feb-15 13:54)  Authored: General Aspect/Present Illness, History and Physical Exam, Review of System, EKG , Allergies, Impression/Plan   Last Updated: 09-Feb-15 13:54 by Teodoro Spray (MD)

## 2014-09-29 NOTE — H&P (Signed)
PATIENT NAME:  Shelley Fields, Shelley Fields MR#:  [id number] DATE OF BIRTH:  May 26, 1936  DATE OF ADMISSION:  07/14/2013  PRIMARY CARE PHYSICIAN: Dr. Sinda Du  REFERRING PHYSICIAN: Dr. Shirlyn Goltz  CHIEF COMPLAINT: Diarrhea.   HISTORY OF PRESENT ILLNESS: Shelley Fields is a 79 year old female with a past medical history of hypertension, hyperlipidemia, diabetes mellitus, cervical cancer with surgery and radiation, comes to the Emergency Department with complaints of multiple episodes of diarrhea, started since this morning. The patient had previous history of C. diff colitis. Started to have multiple episodes of diarrhea, watery. Concerning this, came to the Emergency Department. This was associated with nausea and unable to keep down any food. On workup in the Emergency Department, patient also was found to have urinary tract infection. Stools came back to be positive for C. diff. The patient received one dose of ciprofloxacin. Denies having any fever.   The patient denies recent use of any antibiotics. Denies having any sick contacts.   PAST MEDICAL HISTORY: 1.  Hypertension.  2.  Hyperlipidemia.  3.  Peripheral vascular disease.  4.  Gastroesophageal reflux disease.  5.  Hypertension.  6.  Diabetes mellitus.  7.  Chronic renal insufficiency.   PAST SURGICAL HISTORY: 1.  Cataract extraction, bilateral.  2.  Hysterectomy, total. 3.  Right femoral endarterectomy.  4.  Right leg surgery.  5.  Cervical cancer with radiation 45 years back.   ALLERGIES: No known drug allergies.   HOME MEDICATIONS: 1.  Phenergan 25 mg every 6 hours as needed.  2.  Probiotics 2 times a day.  3.  PreserVision 2 times a day.  4.  Omeprazole 20 mg once a day.  5.  Novolin 70/30, 65 units in the morning, 20 units in hearing.  6.  Nifedipine 30 mg once a day.  7.  Sumatriptan 25 mg 3 times a day.  8.  Mag-Ox 400 mg 2 tablets 2 times a day.  9.  Lovastatin 20 mg once a day.  10.  Lortab 7.5/325 b.i.d. as needed.  11.   Flomax 0.4 mg 3 times a day. 12.  Coumadin 1 mg as needed.  13.  Clonidine 0.2 mg 2 times a day.  14.   100 mg daily.   SOCIAL HISTORY: No history of smoking, drinking alcohol, or using illicit drugs   FAMILY HISTORY: Positive for breast cancer in the sister.   REVIEW OF SYSTEMS:    CONSTITUTIONAL: Generalized weakness.  EYES: No change in vision.  ENT: No change in hearing.  RESPIRATORY: No cough, shortness of breath.  CARDIOVASCULAR: No chest pain, palpations.  GASTROINTESTINAL: Has nausea, vomiting and diarrhea.  GENITOURINARY: No dysuria or hematuria.  ENDOCRINE: No polyuria or polydipsia.  HEMATOLOGIC: No easy bruising or bleeding.  SKIN: No rash or lesions.  MUSCULOSKELETAL: Generalized body aches.  NEUROLOGIC: No weakness or numbness in any part of the body.   PHYSICAL EXAMINATION: GENERAL: This is a well-built, well-nourished, age-appropriate female lying down in the bed, not in distress.  VITAL SIGNS: Temperature 97.5, pulse 78, blood pressure 150/63, respiratory rate of 20, oxygen saturation is 97% on room air.  HEENT: Head normocephalic, atraumatic. There is no scleral icterus. Conjunctivae normal. Pupils equal and react to light. Extraocular movements are intact. Mucous membranes moist. No pharyngeal erythema.  NECK: Supple. No lymphadenopathy. No JVD. No carotid bruits.  CHEST: No focal tenderness. LUNGS: Bilaterally clear to auscultation.  HEART: S1, S2 regular. No murmurs are heard.  ABDOMEN: Bowel sounds present. Soft, nontender,  nondistended.  EXTREMITIES: No pedal edema. Pulses 2+.  NEUROLOGIC: The patient is alert, oriented to place, person and time. Cranial nerves II through XII intact. Motor 5/5 in upper and lower extremities.   LABS: CBC: WBC of 12, hemoglobin 11.6, platelet count of 310. CMP: BUN 33, creatinine 1.8. The rest of the values are within normal limits. UA 3+ leukocyte esterase, WBC more than 30, 2+ moderate bacteria. PT 28, INR of 3.2.   CT  abdomen and pelvis:  No findings to explain the acute findings. The ureters are mildly dilated,  retroperitoneal adenopathy.   ASSESSMENT AND PLAN: Shelley Fields is a 79 year old female who comes to the Emergency Department with diarrhea.   1.  Diarrhea. Considering patient's age, start patient on p.o. vancomycin. Continue with IV fluids.   2.  Urinary tract infection. Follow up with urine cultures. Keep the patient on Rocephin. Will continue with p.o. vancomycin one week after the antibiotics.   3.  History of cervical cancer. Currently undergoing treatment with Oncology.   4.  History of pulmonary embolism. The patient has therapeutic INR.   5.  Diabetes mellitus. Will keep half the dose of at home regimen. Keep the patient on clear liquids.   6.  The patient is already on therapeutic dose of Coumadin.    ____________________________ Monica Becton, MD pv:mr D: 07/14/2013 22:01:00 ET T: 07/14/2013 22:46:32 ET JOB#: [id number]  cc: Monica Becton, MD, <Dictator> Grier Mitts Violeta Lecount MD ELECTRONICALLY SIGNED 07/16/2013 4:22

## 2014-09-30 DIAGNOSIS — E119 Type 2 diabetes mellitus without complications: Secondary | ICD-10-CM | POA: Diagnosis not present

## 2014-09-30 DIAGNOSIS — E784 Other hyperlipidemia: Secondary | ICD-10-CM | POA: Diagnosis not present

## 2014-09-30 DIAGNOSIS — D649 Anemia, unspecified: Secondary | ICD-10-CM | POA: Diagnosis not present

## 2014-09-30 DIAGNOSIS — I509 Heart failure, unspecified: Secondary | ICD-10-CM | POA: Diagnosis not present

## 2014-09-30 NOTE — Consult Note (Signed)
Pt unavailable.  In u/s.  Chart reviewed.  + u/a with negative BCx and contaminated UCx.  u/a remains positive despite antibiotics.  Current regimen does not cover ESBL producing organisms.  Will recheck urine culture today.  Full consult tomorrow.  Electronic Signatures: Nidhi Jacome, [name] (MD)  (Signed on 04-Mar-13 16:26)  Authored  Last Updated: 04-Mar-13 16:26 by Maloni Musleh, [name] (MD)

## 2014-09-30 NOTE — Consult Note (Signed)
PATIENT NAME:  Shelley Fields, WOJAHN MR#:  [id number] DATE OF BIRTH:  1936/04/26  DATE OF CONSULTATION:  07/19/2011  CONSULTING PHYSICIAN:  Simonne Come. Jameison Haji, MD  HISTORY OF PRESENT ILLNESS: Shelley Fields is a 79 year old patient well known to me and seen in the office frequently, and most recently on February 8th, and is on a regular follow-up with chemotherapy with Taxol and carboplatin for endometrial cancer. Decision was just made to hold therapy, continue observation clinically with serial CT scans as needed and with tumor markers. The most recent CT scan has showed stable versus minimally progressive disease in the lungs. Other issues have been watched, including persistent hypomagnesemia requiring frequent intravenous replacement, adjusting the PT/INR with the patient on Coumadin maintenance for a prior clot, monitoring for and planning for additional intravenous gammaglobulin for hypogammaglobulinemia, watching for recurrent Clostridium difficile. The patient was admitted with fever, chills and evidence of urinary tract infection. So far the blood cultures are negative. The urine cultures were positive. Final organism identification is still pending. The patient's strength and energy are good. She feels like she is back to her baseline. She did have one episode of vomiting recently and that has resolved. She is tolerating p.o. No diarrhea. Currently, no headache, no dizziness; again, chills are all resolved and no muscle aches. No rash. No bruising. No palpitations. No shortness of breath. Some tickling nonproductive cough. No wheezing. No hemoptysis. No epistaxis. No bleeding or bruising. No edema. No bone pain.   PAST MEDICAL/SURGICAL HISTORY:  1. Diabetes, which has been fairly labile.  2. Prior pulmonary embolism, on Coumadin maintenance. 3. Prior MRSA infection of the wound.  4. Carcinoma of the cervix, prior radiation therapies. 5. Hypercholesterolemia. 6. Hypertension.  7. Recent infected cyst.   8. Prior cardiac arrest and asystole. 9. Recurrent urinary tract infections.   FAMILY HISTORY: Noncontributory.   SOCIAL HISTORY: No alcohol or tobacco.   ALLERGIES: No known allergies.   MEDICATIONS: At the time of hospitalization: 1. Magnesium oxide 400 mg 3 times a day.  2. Lovastatin 20 mg daily.  3. PreserVision 1 tablet daily.  4. Flomax 0.4 mg twice a day. 5. Omeprazole 20 mg daily.  6. Nifedical XL 60 mg once a day. 7. Atenolol 100 mg once a day.  8. A probiotic daily.  9. Coumadin 2 mg daily Monday through Friday, 1 mg Saturday and Sunday. 10. Aspirin 81 mg daily.  11. Novolin 70/30 insulin, 32 units in the morning and 12 units in the evening. 12. Clonidine 0.2 mg, 1 tablet twice a day.  13. Regular insulin sliding scale.   She had been on Decadron p.r.n. prior to Taxol treatments.   PHYSICAL EXAMINATION:  GENERAL: Alert and cooperative, no acute distress. Minimal pallor. No jaundice.   MOUTH: No thrush.   LUNGS: Clear. No wheezing or rales.   HEART: Regular.   ABDOMEN: Nontender. Old deformity from the abdominal wall wound, stable appearance. It was dressed and covered today. I did not actually inspect the wound, but it has been unchanged recently and the patient confirms that.   EXTREMITIES: No edema.   NEUROLOGIC: Grossly nonfocal. Cranial nerves are intact. Alert and cooperative, moving all extremities against gravity. Evaluated in bed, I did not test the gait.   PSYCHIATRIC: Alert, cooperative, oriented. Affect at baseline. No anxiety.   LYMPH: No lymph nodes palpable in the neck, supraclavicular, submandibular, or axilla.   LABORATORY, DIAGNOSTIC AND RADIOLOGICAL DATA:  Blood culture is negative at 36 hours.  The urine  has gram-positive cocci. Identification is still pending.  Initial urinalysis was remarkable for red cells and white cells.  The blood sugars have been high recently, over 400, currently later this morning was 327. PT/INR was 2.1 today,  hemoglobin 9.6, which is near baseline.  Platelets are 313, white count 11.8, with 10.6 neutrophils.  Admission chest x-ray is consistent with atelectasis versus early infiltrate in the right middle lobe with chronic obstructive pulmonary disease.   IMPRESSION AND PLAN: The patient is with underlying cancer, currently stable to minimally progressive disease, has been clinically stable. She has recently had a staging CT scan of the chest, abdomen, and pelvis, and plan has been to watch with tumor markers and clinically. Also hypomagnesemia has been replenished consistently. PT/INR have been monitored recently. No recent recurrent diarrhea, with prior history of episodes of C. difficile. Now a new urinary tract infection that was symptomatic, fever and chills. The patient has had recurrent urinary tract infections. It looks like a gram-positive organism ID is still pending. No sepsis. Labile diabetes is currently controlled and stable. Hypogammaglobulinemia with the recent results just 600. Plans were to resume intravenous gamma globulins every 1 to 3 months p.r.n. That can be planned but is not urgent. In the presence of actively treated infection, could hold and plan that later in the week in the clinic. I would continue to watch the CBC. I would continue to watch the PT/ INR. I have ordered a magnesium today. I will order replacement IV magnesium. If the magnesium is low today, I would continue vancomycin; and given the preliminary culture, I think we can stop ertapenem and then later be guided by the culture results. I will follow up the patient's status in the morning and then reschedule her outpatient follow-up.   ____________________________ Simonne Come. Inez Pilgrim, MD rgg:cbb D: 07/19/2011 13:48:03 ET T: 07/19/2011 14:22:59 ET JOB#: [id number]  cc: Simonne Come. Inez Pilgrim, MD, <Dictator> Dallas Schimke MD ELECTRONICALLY SIGNED 08/03/2011 14:00

## 2014-09-30 NOTE — Consult Note (Signed)
PATIENT NAME:  Shelley Fields, Shelley Fields MR#:  [id number] DATE OF BIRTH:  04/29/1936  DATE OF CONSULTATION:  08/11/2011  REFERRING PHYSICIAN:  [name], MD CONSULTING PHYSICIAN:  [name]. [name], MD  REASON FOR CONSULTATION: Possible urinary tract infection.   HISTORY OF PRESENT ILLNESS: The patient is a 79 year old white female with a past history significant for metastatic endometrial cancer, diabetes, and recurrent urinary tract infections who was admitted on 08/05/2010 with acute onset of chills. The patient had recently been hospitalized in early February for several days with an enterococcal urinary tract infection. She was given Augmentin and clinically improved. She had been off the antibiotics for a week or so when she began having chills again. Chills have been her presentation for her urinary tract infections in the past. She did not have any dysuria. She had no fever or sweats. She had no nausea or vomiting. She did not have any hematuria, increased frequency, or increased hesitancy or flank pain. In the past when she has had urinary tract infections, chills have been her main symptom. She was brought back to the hospital and her work-up has included a CBC which has shown normal white count. Her urinalysis on admission had greater than 500 glucose, 1+ blood, negative nitrites, 3+ leukocyte esterase, 20 red cells, and 826 white cells. A urine culture had mixed bacterial flora. Blood cultures from admission were negative. A repeat urinalysis was similar with greater than 500 glucose, 1+ blood, negative nitrites, 2+ leukocyte esterase, 23 red cells, and 232 white cells, on 08/09/2011. She has remained afebrile while in the hospital. She has been treated with vancomycin and Zosyn. Overall she is feeling better and has had no further chills. She has no malaise.   ALLERGIES: No known drug allergies.   PAST MEDICAL HISTORY:  1. Diabetes.  2. Endometrial cancer status post hysterectomy, chemotherapy,  and radiation. She has had metastatic spread to the pelvic wall.  3. Pulmonary emboli.  4. Hypertension.  5. Hypercholesterolemia.  6. Recurrent urinary tract infections most recently with enterococcus but ESBL producing organisms have also been found in the past. There is a historical note saying that she also had MRSA in the past.  7. Clostridium difficile colitis.  8. Status post bladder resection.   SOCIAL HISTORY: The patient lives by herself. She has no pets at home. She does not smoke. She does not drink.   FAMILY HISTORY: Negative for diabetes or hypertension.   REVIEW OF SYSTEMS: GENERAL: Positive chills. No fevers. No sweats. No malaise. HEENT: No headache. No sinus congestion. No sore throat. NECK: No stiffness. No swollen glands. RESPIRATORY: No cough or shortness of breath. No sputum production. CARDIAC: No chest pain or palpitations. GI: No nausea, no vomiting, no abdominal pain, and no change in her bowels. GENITOURINARY: No change in her urine. She has had no hematuria, no increased frequency, no increased hesitancy, no dysuria, and no flank pain. MUSCULOSKELETAL: No complaints. SKIN: No rashes. NEUROLOGIC: No focal weakness. PSYCHIATRIC: No complaints. All other systems are negative.   PHYSICAL EXAMINATION:   VITAL SIGNS: T-max 99.5, pulse 61, blood pressure 104/67, and saturation 96% on room air.   GENERAL: A 79 year old white female in no acute distress.   HEENT: Normocephalic, atraumatic. Pupils equal and reactive to light. Extraocular motion intact. Sclerae, conjunctivae, and lids are without evidence for emboli or petechiae. Oropharynx shows no erythema or exudate. Gums are in fair condition.   NECK: Supple. Full range of motion. Midline trachea. No lymphadenopathy. No  thyromegaly.   LUNGS: Clear to auscultation bilaterally with good air movement. No focal consolidation.   HEART: Regular rate and rhythm without murmur, rub, or gallop.   ABDOMEN: Soft, nontender, and  nondistended. No hepatosplenomegaly. No CVA tenderness.   EXTREMITIES: No evidence for tenosynovitis.   SKIN: No rashes. No stigmata of endocarditis, specifically no Janeway lesions or Osler nodes.   NEUROLOGIC: The patient was awake and interactive, moving all four extremities.   PSYCHIATRIC: Mood and affect appeared normal.   LABS/STUDIES: BUN 12 and creatinine 1.16, on 08/08/2011. Her bicarbonate was 23 and anion gap was 13 at that time. LFTs have been unremarkable. White count from 08/08/2011 is 9.7 with a hemoglobin of 8.5, platelet count 266, and ANC of 6.2. White count on admission was 9.7 with an ANC of 6.4.   Blood cultures from admission show no growth.   Urinalysis was positive for glucose, 1+ blood, negative for nitrites, 3+ leukocyte esterase, 20 red cells, and 826 white cells.   Urine culture showed mixed bacterial flora.  Repeat urinalysis from 08/09/2011 had glucose present, 1+ blood, negative nitrites, 2+ leukocyte esterase, 23 red cells, and 232 white cells.   Urine culture from 08/10/2011 is pending.   Chest x-ray from admission showed no acute cardiopulmonary disease.  Bilateral renal ultrasound shows a small right renal cyst, otherwise unremarkable. There was no evidence for obstruction.  IMPRESSION: This is a 79 year old white female with metastatic endometrial cancer and recurrent urinary tract infections admitted with chills and pyuria.   RECOMMENDATIONS:  1. She did not have any upper respiratory infection symptoms on admission, but states that in the past this has also been the case. Her urine has some pyuria. I am not sure if she has chronic pyuria related to her metastatic disease, if this has gotten to the bladder or if this represents acute inflammation from a urinary tract infection. Her first urine culture had mixed flora. She is not been treated for ESBL producing organisms, which have been present in the past.  2. As her urine still shows inflammation,  a repeat urine culture has been sent. If this is positive for a specific organism, we will narrow the antibiotic spectrum. If not we will need to come up with an empiric oral regimen.  3. I would continue current antibiotics until the urine culture returns.  4. I would continue isolation for her prior methicillin resistant Staphylococcus aureus that has been reported.   This is a moderately complex infectious disease case. Thank you very much for involving me in Ms. Stradling' care.   ____________________________ [name]. Ronie Barnhart, MD meb:slb D: 08/11/2011 15:22:56 ET T: 08/11/2011 16:28:45 ET JOB#: [id number]  cc: [name]. Jolin Benavides, MD, <Dictator> [facility] Egan Berkheimer MD ELECTRONICALLY SIGNED 08/18/2011 10:10

## 2014-09-30 NOTE — Discharge Summary (Signed)
PATIENT NAME:  Shelley Fields, Shelley Fields MR#:  [id number] DATE OF BIRTH:  April 27, 1936  DATE OF ADMISSION:  07/18/2011 DATE OF DISCHARGE:  07/20/2011  PRIMARY CARE PHYSICIAN: Dr. Calla Kicks  PRIMARY ONCOLOGIST: Dr. Inez Pilgrim   REASON FOR ADMISSION: Fever and urinary tract infection.   DISCHARGE DIAGNOSES:  1. Fever secondary to urinary tract infection.  2. Enterococcus faecalis urinary tract infection, which is ampicillin sensitive.  3. Leukocytosis, resolved.  4. History of pulmonary embolus.  5. History of diabetes mellitus. 6. History of hypertension.  7. History of hyperlipidemia. 8. 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  9. History of endometrial carcinoma, status post hysterectomy with local spread to pelvic wall status post radiation therapy. Patient on chemotherapy which is currently on hold. Being followed by Dr. Inez Pilgrim.  10. History of extended-spectrum beta-lactamase positive urinary tract infection.  11. History of recurrent Clostridium difficile colitis.  12. History of nonhealing postoperative abdominal wound with wound exploration and wound VAC 11/11/2010 with history of methicillin-resistant Staphylococcus aureus infection.   CONSULTATION: Oncology with Dr. Inez Pilgrim.   LABORATORY, DIAGNOSTIC AND RADIOLOGICAL DATA:  Chest x-ray PA and lateral 07/18/2011: Subsegmental atelectasis versus possible infiltrate right middle lobe. Correlate clinically.   INR 1.8 on admission and 2 on the day of discharge.   CBC on admission: WBC 13.3, hemoglobin 9.7, hematocrit 29.5, platelets 244. CBC on the day of discharge: WBC 8.8, hemoglobin 9.1, hematocrit 27.5, platelets 315.   Blood cultures x2 from 07/18/2011 no growth to date.   Urine culture from 07/18/2011 with growth of enterococcus faecalis which was resistant to tetracycline, ciprofloxacin and levofloxacin; sensitive to nitrofurantoin, ampicillin, linezolid and tigecycline, also sensitive to vancomycin.  Urinalysis on  admission with cloudy urine with trace leukocyte esterase, 29 WBCs, 1+ bacteria.   Hemoglobin A1c 8.4.   BRIEF HISTORY/HOSPITAL COURSE: Patient is a pleasant 79 year old female being followed by Dr. Inez Pilgrim for history of endometrial carcinoma with metastatic disease status post radiation and chemotherapy, history of hypertension, hyperlipidemia, diabetes mellitus, recurrent Clostridium difficile urinary tract infection, history of ESBL positive urinary tract infection, history of nonhealing postoperative abdominal wound infection with MRSA status post wound exploration and wound VAC 11/11/2010 who presented to the Emergency Department with complaints of fevers. Please see dictated admission history and physical for pertinent details surrounding the onset of this hospitalization. Please see below for further details.  1. Fever-Initially felt to be due to urinary tract infection based off abnormal urinalysis. She was febrile at the time of presentation and also had leukocytosis. Blood and urine cultures were obtained and thereafter patient was empirically started on IV antibiotics and initially vancomycin as well given her past history of MRSA and was also maintained on Invanz. With these measures she had defervesced and her overall clinical condition had improved. Chest x-ray was suggestive of right middle lobe atelectasis versus early pneumonia or an infiltrate, however, pneumonia was felt to be clinically less likely as she was without any shortness of breath or productive cough. She did have a dry cough, however. It is recommended that she have a repeat chest x-ray in approximately a month as an outpatient which can be performed as an outpatient to reassess findings noted on chest x-ray during this hospitalization. Otherwise her fever was felt to be secondary to urinary tract infection. Urine culture returned positive for growth of Enterococcus faecalis which is ampicillin sensitive. Thereafter antibiotics  were adjusted and narrowed/tailored and she was switched to oral antibiotics and will be discharged home on Augmentin and  I have recommended seven-day course of treatment. She has completed two inpatient days of IV antibiotic therapy and has five additional days of oral therapy remaining at the time of discharge. As above, she has clinically improved had defervesced with antibiotic therapy and her leukocytosis had resolved. Since Enterococcus faecalis was sensitive to ampicillin, amoxicillin should provided adequate coverage for Enterococcus faecalis urinary tract infection.  2. Leukocytosis-Due to urinary tract infection and resolved with antibiotics and urine cultures have been reviewed.  3. History of pulmonary embolus-Patient was on Coumadin on admission. INR is therapeutic on the day of discharge. She will need to have her INR closely monitored as an outpatient especially while she is on antibiotics. Recommend repeat PT/INR in the next 2 to 3 days with further adjustments to be made to patient's Coumadin dosing accordingly and this can be followed by either patient's primary care physician or Dr. Inez Pilgrim at the [facility]. 4. Type 2 diabetes mellitus-Uncontrolled with hemoglobin A1c of 8.4. Initially it was on her medication list that she was taking Decadron and this medication was written at the time of admission and she received a dose which did lead to some hyperglycemia. Patient then advised [place] that she only takes Decadron on the days prior to chemotherapy and thereafter this agent was discontinued. She was maintained on insulin 70/30 in the hospital as well as sliding scale insulin. Her diabetic regimen may need to be adjusted as an outpatient as A1c indicates suboptimal control. This will be deferred to patient's primary care physician.  5. Hypertension-Patient's blood pressure has been relatively well controlled during this hospitalization. She will continue atenolol, nifedipine and clonidine.   6. Hyperlipidemia-Patient to continue statin therapy.  7. Chronic anemia-Likely chemotherapy induced. During this hospitalization there were no indications for blood transfusion and she will need to have her hemoglobin and hematocrit closely monitored as an outpatient at the [facility] especially while on chemotherapy.  8. History of endometrial carcinoma, status post hysterectomy and local spread to pelvic wall status post radiation therapy-Patient was on chemotherapy at the time of admission but this is currently on hold per Dr. Inez Pilgrim and she will follow up at the [facility] with Dr. Inez Pilgrim as an outpatient later this week for further recommendations in regards to when she can restart her chemotherapy.  9. On 07/20/2011 patient was hemodynamically stable and without any fevers and was without any specific complaints and felt well and at her baseline and she was felt to be stable for discharge home with close outpatient follow up to which patient was agreeable.   DISCHARGE DISPOSITION: Home.   DISCHARGE CONDITION: Improved, stable.   DISCHARGE ACTIVITY: As tolerated.   DISCHARGE DIET: ADA.  DISCHARGE MEDICATIONS:  1. Augmentin 875 mg p.o. b.i.d. x5 days.  2. Coumadin 1.5 mg p.o. daily.  3. Magnesium oxide 400 mg p.o. t.i.d.  4. Lovastatin 20 mg p.o. at bedtime.  5. PreserVision 1 tab p.o. daily.  6. Flomax 0.4 mg b.i.d.  7. Omeprazole 20 mg daily.  8. Nifedical XL 60 mg p.o. daily.  9. Atenolol 100 mg p.o. daily. 10. Probiotic 1 cap p.o. in the morning.  11. Aspirin 81 mg daily. 12. Novolin 70/30, 32 units subcutaneously q.a.m. with breakfast and 12 units subcutaneously q.p.m. with supper.  13. Clonidine 0.2 mg p.o. b.i.d.  14. Vicodin 5/500, 1 to 2 tablets p.o. every four hours p.r.n. pain.   DISCHARGE INSTRUCTIONS:  1. Take medications as prescribed.  2. Return to Emergency Department for recurrence  of symptoms or for development of fevers or chills or any worsening of your  cough or if cough becomes productive or if you develop any significant shortness of breath.   FOLLOWUP INSTRUCTIONS:  1. Follow up with Dr. Inez Pilgrim within one week. Patient needs repeat PT/INR as well as repeat CBC within one next week.  2. Follow up with Dr. Eliberto Ivory within 1 to 2 weeks. Primary care physician to please consider repeat portable chest x-ray within 1 to 2 months to reassess findings noted in the right middle lobe which could be atelectasis versus infiltrate and also patient needs more aggressive diabetic control.   TIME SPENT ON DISCHARGE: Greater than 30 minutes.   ____________________________ Romie Jumper, MD knl:cms D: 07/25/2011 02:47:14 ET T: 07/25/2011 15:12:00 ET  JOB#: [id number] cc: Romie Jumper, MD, <Dictator> Dory Horn. Eliberto Ivory, MD Simonne Come Inez Pilgrim, MD Romie Jumper MD ELECTRONICALLY SIGNED 07/26/2011 3:26

## 2014-09-30 NOTE — H&P (Signed)
PATIENT NAME:  Shelley Fields, Shelley Fields MR#:  [id number] DATE OF BIRTH:  [birth year]  DATE OF ADMISSION:  08/06/2011  PRIMARY CARE PHYSICIAN:  Dr. Calla Kicks ONCOLOGIST:  Dr. Inez Pilgrim  CHIEF COMPLAINT:  Chills.  HISTORY OF PRESENT ILLNESS: The patient is a 79 year old female patient with history of hypertension, diabetes, history of endometrial cancer status post hysterectomy and local spread to the pelvic wall, and history of recent Enterococcus faecalis urine infection who came in because of chills. The patient denies any dysuria. No chest pain. No cough. The patient did not have any fever. No nausea. No vomiting but her p.o. intake is decreased today. No diarrhea. Because of her immunosuppression and recent treatment with amoxicillin and Augmentin for a total of 10 days and still has urinalysis positive for bacteria,  Dr. Inez Pilgrim, oncologist, is recommending admission because she probably has early sepsis.  PAST MEDICAL HISTORY:  1. She was here from 02/09 to 07/20/2011 because of urine infection.  At that time she had Enterococcus faecalis sensitive to ampicillin and she was sent home with Augmentin.  2. History of pulmonary emboli.  3. Diabetes mellitus type 2.  4. Hypertension.  5. Hyperlipidemia.  6. Chronic anemia secondary to chemotherapy.  7. Endometrial cancer status post hysterectomy. The patient had radiation therapy before. Chemotherapy is on hold.  She follows with Dr. Inez Pilgrim. 8. History of ESBL urinary tract infection. 9. History of recurrent C. difficile.  ALLERGIES: No known allergies.   PAST SURGICAL HISTORY:  1. Hysterectomy.  2. Bladder resection.   ALLERGIES: No known allergies.   SOCIAL HISTORY: She is divorced, has one child. No smoking. No drinking. No drugs. She  used to work as a Insurance claims handler.  Now retired, lives at home.   FAMILY HISTORY:  no  h/o htn or DM.  REVIEW OF SYSTEMS:  The patient has chills and decreased p.o. intake today, also weakness.  EYES: No  blurred vision. ENT: No tinnitus. No ear pain.  RESPIRATIONS:  No cough. No wheezing. CARDIOVASCULAR: No chest pain. No palpitations. GASTROINTESTINAL: No nausea. No vomiting. No diarrhea. No abdominal pain. Decreased p.o. intake today. GENITOURINARY: No dysuria but has chills. ENDOCRINE: No polyuria. No heat or cold intolerance. HEME: No anemia. INTEGUMENT: No skin rashes. MUSCULOSKELETAL: No joint pain. NEUROLOGIC: No numbness or weakness. PSYCH: No anxiety or insomnia.   PHYSICAL EXAMINATION:  VITAL SIGNS: Temperature 98.1, pulse 70, respirations 18, blood pressure 143/65. Sats 99% on room air.   GENERAL: She is alert, awake, oriented. Very pleasant female, answering questions appropriately.   HEENT: Head atraumatic, normocephalic. Pupils are equally reacting to light. Extraocular movements are intact.  No tympanic membrane congestion. No turbinate hypertrophy.   NECK: No JVD. No thyroid enlargement. No lymphadenopathy.    LUNGS: Clear to auscultation. No wheeze. No rales.   CARDIOVASCULAR:  S1, S2 regular.  PMI not displaced. The patient has 2+ dorsalis pedis pulses bilaterally.   ABDOMEN: Soft, nontender, nondistended. Bowel sounds present.    MUSCULOSKELETAL: Power, strength 5/5 upper and lower extremities.   SKIN: No skin rashes. The port is clean.   LYMPH: No lymphadenopathy.   NEUROLOGICAL: Cranial nerves II through XII are intact. Power five out of five in upper and lower extremities.  Sensation intact.  DTRs 2+ bilaterally.   LABORATORY DATA: WBC 9.7, hemoglobin 9.9, hematocrit 30.9, platelets 344. Glucose 259. Sodium is 140, potassium 4.2, chloride 105, bicarbonate 21, BUN 10, creatinine 1.18, glucose 251. The patient has cloudy urine which is showing 3+  leukocyte esterase and 3+ bacteria. WBC 826. Chest x-ray showed Port-A-Cath present. Lungs are clear. No infiltrate or pneumothorax.   ASSESSMENT AND PLAN:  1. This is a 79 year old female with a history of previous urinary  tract infections, hypertension, diabetes, and endometrial cancer who comes in now with chills even though she does not have any fever or white count. She just finished antibiotics, 10-day course, so she is at high risk for developing pyelonephritis versus sepsis. She is going to be admitted to the hospitalist service for IV antibiotics. We will cover her with broad-spectrum vancomycin and  Zosyn and follow the blood cultures and urine cultures. I spoke with Dr. Inez Pilgrim who recommends admission. In view of her immunosuppression she is high risk for developing urosepsis even though she did not have any fever. We are going to follow urine cultures, blood cultures and see if she remains stable. If her urine culture comes back we can titrate the antibiotics and discharge her home with p.o. antibiotics depending on sensitivity.  2. Continue the medication for high blood pressure and also for her diabetes. I decreased the dose of Novolin because her p.o. intake is not very good. Depending on her sugars that can be resumed. 3. Pulmonary embolism.  She is on Coumadin. Her INR is 1.7 today. She is on Coumadin 3 mg at night. Have PT-INR checked tomorrow.  4. The patient's condition is stable. I discussed the plan with the patient and the patient's daughter.   TIME SPENT ON HISTORY AND PHYSICAL: About 60 minutes.  ____________________________ Epifanio Lesches, MD sk:bjt D: 08/06/2011 22:04:18 ET T: 08/07/2011 07:39:24 ET JOB#: [id number]  cc: Dory Horn. Eliberto Ivory, MD Epifanio Lesches MD ELECTRONICALLY SIGNED 08/11/2011 8:46

## 2014-09-30 NOTE — H&P (Signed)
PATIENT NAME:  Shelley Fields, Shelley Fields MR#:  [id number] DATE OF BIRTH:  09-22-1935  DATE OF ADMISSION:  07/18/2011  REFERRING PHYSICIAN: Dr. Marta Antu, Emergency Room    PRIMARY CARE PHYSICIAN: Dr. Calla Kicks   PRIMARY ONCOLOGIST: Dr. Barbette Reichmann   REASON FOR ADMISSION: Fever, urinary tract infection, history of urinary suppression.   HISTORY OF PRESENT ILLNESS: This is a 79 year old white female with past medical history of hypertension, diabetes, endometrial carcinoma P2 N1 stage III-C who received Taxol and carboplatin two weeks ago, has past pulmonary embolus and delayed wound healing who presents to the ER with one day history of fever. The patient saw her oncologist on February 4th and then yesterday got port cat placement. She said she was doing well, went home and felt fevers, chills, and shakes. She said she had temperature of 101.8. She had some cough. She said she feels mildly weak but no chest pain, shortness of breath, nausea, vomiting, or diarrhea. She called her oncologist, Dr. Inez Pilgrim, who referred her here. She apparently has been weak and not been able to tolerate chemotherapy recently and has only received her last dose two weeks ago as per her oncologist. She was found to have temperature 100.5, leukocytosis, mild tachycardia and we are asked to admit the patient for urinary tract infection.   PAST MEDICAL HISTORY:  1. Hypertension.  2. Diabetes.  3. Endometrial carcinoma. 4. Past PE/DVT. 5. Carcinoma of the cervix. 6. Recurrent Clostridium difficile and urinary tract infection. 7. Prior episode of cardiac arrest.   PAST SURGICAL HISTORY:  1. Hysterectomy. 2. Bladder resection.   MEDICATIONS AT HOME:  1. Mag oxide 400 mg 3 times a day. 2. Dexamethasone 4 mg b.i.d.  3. Lovastatin 20 mg daily.  4. PreserVision once daily.  5. Flomax 0.4 mg b.i.d.  6. Omeprazole 20 mg daily.  7. Nifedipine 60 mg daily.  8. Atenolol 100 mg daily. 9. Probiotic 1 capsule daily.   10. Coumadin 2 mg daily.  11. Aspirin 81 mg daily.  12. Novolin 70/30 32 units in the morning, 12 units in the evening. 13. Clonidine 0.2 mg 2 times a day.  14. She has received Procrit.   DRUG ALLERGIES: No known drug allergies.   SOCIAL HISTORY: She is divorced with one healthy adult child. Does not smoke, drink, or use illicit drugs. Used to work as a Administrator, sports, now retired. Lives at home. Is able to do her activities of daily living.   FAMILY HISTORY: She is not sure about her mother and father.  REVIEW OF SYSTEMS: CONSTITUTIONAL: She had fever, fatigue, and weakness. EYES: No blurred vision, double vision, pain, redness, inflammation, glaucoma. ENT: No tinnitus, ear pain, hearing loss, seasonal allergies, epistaxis, or discharge. RESPIRATORY: No cough, wheezing, hemoptysis, dyspnea, painful respirations. She did have cough. CARDIOVASCULAR: No chest pain, orthopnea, edema, arrhythmia, dyspnea on exertion, palpitations. GI: No nausea, vomiting, diarrhea, abdominal pain, tenderness, melena, gastroesophageal reflux disease. GU: No dysuria, hematuria, renal calculi, frequency, incontinence. GYN/BREASTS: No breast mass, tenderness, or vaginal discharge. She is postmenopausal. ENDOCRINE: No polyuria, nocturia, thyroid problems, increased sweating, heat or cold intolerance. HEME/LYMPH: No anemia, easy bruising, swollen glands. INTEGUMENTARY: No acne, rash, change in mole, hair or skin. MUSCULOSKELETAL: No pain in back, shoulder, knee, hip. NEUROLOGIC: No numbness, weakness, dysarthria, epilepsy, tremor, vertigo. PSYCH: No anxiety, insomnia, ADD, bipolar, or depression.    PHYSICAL EXAMINATION:   VITAL SIGNS: Temperature 100.5, blood pressure 117/86, blood pressure initially systolic 315/176, now 160/73, heart rate 101, sating  98% on room air, respiratory rate 18.   GENERAL: The patient is well developed, well nourished in no apparent distress. Alert and oriented x3.   HEENT: Pupils equal,  reactive to light and accommodation. Extraocular movements intact. Anicteric sclerae. No difficulty hearing. Oropharynx clear.   NECK: No JVD. No thyromegaly. No lymphadenopathy. No carotid bruits.   LUNGS: Clear to auscultation. No adventitious breath sounds. No resonance to percussion.   CARDIOVASCULAR: Regular rate and rhythm. Normal S1, S2. No murmurs, rubs, or gallops. No lower extremity edema. PMI lateralized. 2+ dorsalis pedis pulses.   BREASTS: Deferred.   ABDOMEN: Soft, nontender, nondistended. Positive bowel sounds. Bandaged. She still has healing wound above her bladder and pelvis area.   GU: Deferred.   MUSCULOSKELETAL: Strength 5 out of 5. No clubbing, cyanosis, or degenerative joint disease.   SKIN: No rashes, lesions, or induration except on her abdomen where she has a bandage. She has a port which is clean, dry, and intact.   LYMPH: No lymphadenopathy in the cervical area.  NEUROLOGIC: Cranial nerves II through XII are intact. +2 reflexes. No dysarthria, dysphagia, or contractures.   PSYCH: Alert and oriented x3 with good judgment.   LABORATORY, DIAGNOSTIC, AND RADIOLOGICAL DATA: Chest x-ray shows no acute cardiopulmonary process. Glucose 248, BUN 20, creatinine 1.21, sodium 135, potassium 3.9, chloride 102, bicarb 24, anion gap 9, calcium 8.7, total protein 6.8, albumin 2.5, total bilirubin 0.2, alkaline phosphatase 53, AST 10, ALT 9, white count 13.3, hemoglobin 9.7, hematocrit 29.5, platelets 244. EKG shows normal sinus rhythm. She had a CT of the abdomen on 07/16/2011 which showed subcutaneous nodule epigastrium right midline no longer evident. There is marked thinning of the lower abdominal wall musculature which may be related to previous surgeries. No acute bowel abnormality. Nonobstructing stones in both kidneys but no acute abnormality of the kidneys or urinary bladder demonstrated. No pelvic masses are demonstrated. Chest x-ray on 07/18/2011 showed findings  consistent with atelectasis or early pneumonia in the right middle lobe.   ASSESSMENT AND PLAN: This is a 79 year old white female with history of endometrial carcinoma recently on carboplatin, Taxol who presents with fever and cough.  1. Fever, UTI, mild tachycardia, leukocytosis. Will continue her IV fluids. Invanz as she has a past history of EBSL.  2. Pneumonia as evidenced by leukocytosis and cough. Start mucolytics. Continue vancomycin and Invanz. P.r.n. nebulizers. Patient is immunocomprimesed so will continue vancomycin annd invanz, currently she is not neutropenic. 3. DVT/PE. When the patient came in she was subtherapeutic. Will send off INR which was not done today.   4. Hypertension. Will continue atenolol and nifedipine. 5. Diabetes. Continue sliding scale insulin and 70/30 as at home. 6. Hyperlipidemia. Continue lovastatin.  7. Endometrial carcinoma. Will get Oncology consult with Dr. Inez Pilgrim.  8. DVT prophylaxis. Maintain with aspirin and Coumadin.   CODE STATUS: FULL CODE.   TOTAL TIME SPENT ON ADMISSION: 55 minutes.   Thank you for allowing me to participate in the care of this patient.   ____________________________ Judeth Horn. Royden Purl, MD aaf:drc D: 07/18/2011 11:20:04 ET T: 07/18/2011 12:51:18 ET JOB#: [id number]  cc: Mike Craze A. Royden Purl, MD, <Dictator>, Dory Horn. Eliberto Ivory, MD, Simonne Come Inez Pilgrim, MD Joaquin Bend MD ELECTRONICALLY SIGNED 07/18/2011 14:07

## 2014-09-30 NOTE — H&P (Signed)
PATIENT NAME:  Shelley, Fields MR#:  [id number] DATE OF BIRTH:  1935/11/23  DATE OF ADMISSION:  05/06/2011  HISTORY OF PRESENT ILLNESS: Shelley Fields is a 79 year old patient well known to me. She was admitted after initial evaluation in the [place]. She is admitted for fever and chills. Yesterday after a blood transfusion for symptomatic anemia, she went home and she had shaking chills and felt very cold. She did not call for medical attention. This morning she felt better. She was not aware of any fever today but in the clinic her temperature was still 100.5. her neutrophil count has been borderline adequate but she did not fall onto the demonstrate absolute neutropenic range. She is early mid cycle from recent chemotherapy and is immunocompromised. Today the neutrophil count is adequate but with fever and transfusion yesterday there is a potential for blood transfusion reaction, blood contamination, or systemic infection from any source including recent IVs, and of course patient has a Port-A-Catheter, and she has had recurrent urinary tract infections prior. There is no nausea, vomiting, diarrhea, although she has previously had current C. difficile, there is no evidence of a GI problem. No significant pulmonary symptoms, not coughing, no wheezing, no hemoptysis. No headache or dizziness now. She has some general weakness. She has waxing and waning blood glucose at home. She does not have dysuria or hematuria. Does not have a new rash or bruising. She does have some irritation and scant drainage chronically in the abdominal wound dressing but the wound is otherwise felt to be unremarkable. It has been looked at frequently. No increased edema. No new bone pain. No pharyngitis, stomatitis or musculoskeletal aches or pains or any flulike symptoms.   PAST MEDICAL/SURGICAL HISTORY:  1. Carcinoma of the endometrium and recent treatment with Taxol and carboplatin.  2. She has had MRSA infection in the wound  in the past.  3. Distant history of carcinoma of cervix. 4. Recurrent C. difficile infection.  5. She had a prior wound dehiscence. 6. Azotemia. 7. Recurrent urinary tract infections. 8. Prior cardiac arrest and asystole. 9. Congestive heart failure with reduced ejection fraction.  10. Radiation treatment of the cervix. 11. Port-A-Cath placement. 12. Pulmonary embolus, currently on Coumadin maintenance.   FAMILY HISTORY: Noncontributory.   SOCIAL HISTORY: Noncontributory.    ALLERGIES: No known allergies.   REGULAR MEDICATIONS:  1. Decadron prior to chemotherapy cycle. 2. Magnesium oxide 400 mg 3 times a day. 3. Lovastatin 20 mg every evening.  4. Oral iron once a day in the morning. 5. PreserVision 1 tablet once a day.  6. Flomax 0.4 mg twice a day. 7. In the past had taken but not recently Vicodin p.r.n. for pain. 8. Prior but not recently taken Phenergan 25 mg every six hours p.r.n. for pain. 9. Imodium p.r.n.  10. Omeprazole 20 mg p.o. a.m.  11. Nifedical XL 60 mg p.o. daily. 12. Atenolol 100 mg p.o. daily. 13. Probiotic capsule once daily.  14. Coumadin 2 mg daily at 5:00. Coumadin has been 2 mg daily with most recent dose of 2 mg six days a week, none on Fridays.  15. Novolin 70/30, takes 12 units in the evening and 32 units in the morning.  16. Aspirin 81 mg every morning.  17. Clonidine 0.2 mg p.o. b.i.d.   PHYSICAL EXAMINATION:  GENERAL: Alert and cooperative, not in acute distress, some pallor. No jaundice. Color is better after recent blood transfusion.   MOUTH: No thrush.   EYES: Sclerae clear.  NECK: No mass.   LYMPH: No palpable lymph nodes in the neck, supraclavicular, submandibular, axilla.   RESPIRATORY: No current wheezing or rales.   HEART: Regular.   ABDOMEN: Nontender. No palpable mass or organomegaly. Wound is currently covered in a dressing, inspected at this time.   EXTREMITIES: No extremity edema.   SKIN: No bruising of the skin.    NEUROLOGIC: Grossly nonfocal. Cranial nerves are intact. No gross focal weakness. No tremor.   LABORATORY, DIAGNOSTIC AND RADIOLOGICAL DATA: The urinalysis reviewed after admission shows white count over 5000 and clumping and positive esterase at 3+ compatible with urine source of fever. Transfusion reaction work-up was done is reported to be negative by the pathologist. Magnesium 1.5. Creatinine 1.51, which is close to baseline. White count 4.4, hemoglobin 9.5, platelets 309 and neutrophils 1.4. Liver chemistries not documented. A recent pro time was therapeutic. Pro time was not repeated today. Liver chemistries were recently documented were unremarkable on 11/12 except for albumin low at 2.4.   IMPRESSION: Fever and chills following transfusion but it looks like that was misleading as there is clear evidence of urinary tract infection. Blood cultures are pending. No evidence of sepsis. Patient most recent prior urine infections have been with Enterobacter and  Klebsiella, both have been sensitive to Levaquin. Patient has waxing and waning creatinine, waxing and waning glucose and underlying cancer, currently an adequate neutrophil count of 1400. Hemoglobin is okay after recent transfusion, platelets look good.   PLAN: Patient was admitted. Kept on her chronic medications and would recheck the pro time tomorrow. Would recheck electrolytes and CBC tomorrow, started on vancomycin empirically and Zosyn and then later held Zosyn and added p.o. Levaquin. Will watch the final sensitivities and will look at the blood cultures. Will also consider if patient should have empiric p.o. Flagyl during prolonged antibiotics to prevent C. difficile. If blood cultures are negative and infection sensitive to oral medications patient can be discharged in 48 hours.  ____________________________ Simonne Come Inez Pilgrim, MD rgg:cms D: 05/07/2011 00:26:29 ET T: 05/07/2011 07:12:06 ET  JOB#: [id number] Dallas Schimke  MD ELECTRONICALLY SIGNED 06/15/2011 11:45

## 2014-09-30 NOTE — Consult Note (Signed)
Impression: 79yo WF w/ metastatic endometrial CA, recurrent UTI admitted with chills and pyuria.  She did not have any urinary tract symptoms on admission, but states that in the past this has also been the case.  Her urine had some pyuria.  I am not sure if she has chronic pyuria related to her metastatic disease or if this represents acute inflammation from a UTI.  Her first UCx had mixed flora.  She is not being treated for ESBL producing organism, which have been present in the past.   As her urine still shows inflammation, a repeat urine cultures was sent.  If this is positive for a specific organism, will narrow the antibiotic spectrum.  If not, will need to come up with an empiric oral regimen. Continue current antibiotics until urine culture returns. Continue isolation for prior Methacillin Resistant Staph aureus.  Electronic Signatures: Adriane Guglielmo, [name] (MD)  (Signed on 05-Mar-13 15:08)  Authored  Last Updated: 05-Mar-13 15:08 by Hulbert Branscome, [name] (MD)

## 2014-09-30 NOTE — Discharge Summary (Signed)
PATIENT NAME:  Shelley Fields, MARSZALEK MR#:  [id number] DATE OF BIRTH:  [birth year]  DATE OF ADMISSION:  08/06/2011 DATE OF DISCHARGE:  08/13/2011   ADMITTING DIAGNOSIS: Chills.   DISCHARGE DIAGNOSES:  1. Chills, possibly due to urinary tract infection, although her cultures are negative. Status post evaluation by infectious disease.  2. Hematuria/possible vaginal bleed, now resolved, with similar history of same thing in the past, patient's Coumadin held.  3. History of recurrent urinary tract infections. Previous history of Enterococcus faecalis infection.  4. History of pulmonary emboli.  5. Type 2 diabetes.  6. Hypertension.  7. Hyperlipidemia.  8. Chronic anemia.  9. History of recurrent hypomagnesemia.  10. Endometrial cancer, status post hysterectomy. The patient has had radiation therapy. She is followed by Dr. Inez Pilgrim.  11. History of the ESBL urinary tract infection.  12. History of recurrent C. difficile.  13. Status post hysterectomy.  14. Status post bladder resection.   CONSULTANTS: Dr. Clayborn Bigness  PERTINENT LABORATORY DATA AND EVALUATIONS: BUN 20, creatinine 1.18, sodium 140, potassium 4.2, chloride 105, CO2 21, calcium 8.6. LFTs: Total protein 7, albumin 2.6, bilirubin total 0.2, alkaline phosphatase 40, AST 12, ALT 12. WBC 9.7, hemoglobin 9.9, platelet count 324. Urine culture showed mixed bacterial organisms. Blood cultures: No growth. Repeat cultures on 03/04 no growth. Urinalysis on presentation: WBCs 826, 3+ bacteria, leukocyte esterase 3+. Renal ultrasound showed a small right renal cyst, otherwise unremarkable renal ultrasound.   HOSPITAL COURSE: Please see History and Physical done by the admitting physician. The patient is a 79 year old female with history of hypertension, diabetes, history of endometrial cancer status post hysterectomy and local spread to the pelvic wall, and history of recurrent urinary tract infections who was noted to have chills at home; therefore, she was  sent for evaluation and was noted to have a urinalysis which was abnormal and suggested a urinary tract infection. The patient was admitted and placed on broad-spectrum antibiotics due to her recurrent urinary tract infections. Even though the patient was kept on IV antibiotics for about four days, repeat urinalysis showed similar findings in terms of positive leukocyte esterase and WBCs. Therefore, an infectious disease consult was obtained with Dr. Clayborn Bigness who was not sure if the patient had a recurrent urinary tract infection or whether her abnormal urinalysis also could be related to her endometrial cancer. At this time he recommended p.o. Augmentin, of which she will finish a course. Also of note the patient does have history of PE and is on chronic Coumadin therapy. During hospitalization the patient had an episode of bleeding while urinating. It was unclear whether it was hematuria or vaginal bleeding. The patient had one episode. Her Coumadin was held. She reports that she has had similar episodes in the past. After holding Coumadin for a few days she had no recurrence. If she continues to have this problem. She will need outpatient urology/GYN evaluation. At this point her hemoglobin is stable. I have discussed with Dr. Barbette Reichmann regarding stopping her Coumadin. He will see her Monday and at that time will need to decide whether to restart the Coumadin. Currently she is stable for discharge.   DISCHARGE MEDICATIONS:  1. Mag oxide 400 mg, 1 tab p.o. t.i.d.  2. Lovastatin 20 mg at bedtime.  3. PreserVision 1 tab p.o. daily.  4. Flomax 0.4 mg 1 tab p.o. b.i.d.  5. Omeprazole 20 mg daily.  6. Nifedical XL 60 daily.  7. Atenolol 100 daily.  8. Probiotic 1 cap p.o. daily.  9. Novolin 70/30, 32 units subcutaneous q. a.m., 12 units subcutaneous q. p.m. with supper. Clonidine 0.2, 1 tab p.o. b.i.d.  10. Lortab 7.5/500, 1 tab p.o. q. 6 p.r.n.  11. Aspirin 81, 1 tab p.o. daily.  12. Augmentin 500 mg  p.o. b.i.d. times 6 days.   HOME OXYGEN: None.   DIET: Low sodium ADA diet.   ACTIVITY: As tolerated.   FOLLOWUP: Timeframe for followup: 1 to 2 weeks with Dr. Yves Dill. Follow up with Dr. Barbette Reichmann on Monday. No Coumadin until seen by Dr. Inez Pilgrim on  Monday.    TIME SPENT: 35 minutes.   ____________________________ Lafonda Mosses Posey Pronto, MD shp:bjt D: 08/13/2011 13:47:42 ET T: 08/13/2011 14:31:25 ET JOB#: [id number]  cc: Chizaram Latino H. Posey Pronto, MD, <Dictator> Alric Seton MD ELECTRONICALLY SIGNED 08/14/2011 7:55

## 2014-10-03 DIAGNOSIS — Z794 Long term (current) use of insulin: Secondary | ICD-10-CM | POA: Diagnosis not present

## 2014-10-03 DIAGNOSIS — R32 Unspecified urinary incontinence: Secondary | ICD-10-CM | POA: Diagnosis not present

## 2014-10-03 DIAGNOSIS — M79605 Pain in left leg: Secondary | ICD-10-CM | POA: Diagnosis not present

## 2014-10-03 DIAGNOSIS — R630 Anorexia: Secondary | ICD-10-CM | POA: Diagnosis not present

## 2014-10-03 DIAGNOSIS — K589 Irritable bowel syndrome without diarrhea: Secondary | ICD-10-CM | POA: Diagnosis not present

## 2014-10-03 DIAGNOSIS — K769 Liver disease, unspecified: Secondary | ICD-10-CM | POA: Diagnosis not present

## 2014-10-03 DIAGNOSIS — C55 Malignant neoplasm of uterus, part unspecified: Secondary | ICD-10-CM | POA: Diagnosis not present

## 2014-10-03 DIAGNOSIS — G8929 Other chronic pain: Secondary | ICD-10-CM | POA: Diagnosis not present

## 2014-10-03 DIAGNOSIS — T148 Other injury of unspecified body region: Secondary | ICD-10-CM | POA: Diagnosis not present

## 2014-10-03 DIAGNOSIS — I10 Essential (primary) hypertension: Secondary | ICD-10-CM | POA: Diagnosis not present

## 2014-10-03 DIAGNOSIS — R918 Other nonspecific abnormal finding of lung field: Secondary | ICD-10-CM | POA: Diagnosis not present

## 2014-10-03 DIAGNOSIS — R109 Unspecified abdominal pain: Secondary | ICD-10-CM | POA: Diagnosis not present

## 2014-10-03 DIAGNOSIS — Z79899 Other long term (current) drug therapy: Secondary | ICD-10-CM | POA: Diagnosis not present

## 2014-10-03 DIAGNOSIS — E119 Type 2 diabetes mellitus without complications: Secondary | ICD-10-CM | POA: Diagnosis not present

## 2014-10-03 DIAGNOSIS — N189 Chronic kidney disease, unspecified: Secondary | ICD-10-CM | POA: Diagnosis not present

## 2014-10-03 DIAGNOSIS — E780 Pure hypercholesterolemia: Secondary | ICD-10-CM | POA: Diagnosis not present

## 2014-10-03 DIAGNOSIS — M7989 Other specified soft tissue disorders: Secondary | ICD-10-CM | POA: Diagnosis not present

## 2014-10-03 DIAGNOSIS — Z7901 Long term (current) use of anticoagulants: Secondary | ICD-10-CM | POA: Diagnosis not present

## 2014-10-03 DIAGNOSIS — R971 Elevated cancer antigen 125 [CA 125]: Secondary | ICD-10-CM | POA: Diagnosis not present

## 2014-10-03 DIAGNOSIS — C4491 Basal cell carcinoma of skin, unspecified: Secondary | ICD-10-CM | POA: Diagnosis not present

## 2014-10-03 DIAGNOSIS — M25552 Pain in left hip: Secondary | ICD-10-CM | POA: Diagnosis not present

## 2014-10-03 DIAGNOSIS — M545 Low back pain: Secondary | ICD-10-CM | POA: Diagnosis not present

## 2014-10-03 DIAGNOSIS — R197 Diarrhea, unspecified: Secondary | ICD-10-CM | POA: Diagnosis not present

## 2014-10-03 DIAGNOSIS — K219 Gastro-esophageal reflux disease without esophagitis: Secondary | ICD-10-CM | POA: Diagnosis not present

## 2014-10-03 DIAGNOSIS — M79604 Pain in right leg: Secondary | ICD-10-CM | POA: Diagnosis not present

## 2014-10-03 LAB — CBC CANCER CENTER
BASOS ABS: 0.1 x10 3/mm (ref 0.0–0.1)
Basophil %: 0.7 %
EOS ABS: 0.2 x10 3/mm (ref 0.0–0.7)
EOS PCT: 2.5 %
HCT: 30.7 % — ABNORMAL LOW (ref 35.0–47.0)
HGB: 9.5 g/dL — ABNORMAL LOW (ref 12.0–16.0)
Lymphocyte #: 2.2 x10 3/mm (ref 1.0–3.6)
Lymphocyte %: 22.6 %
MCH: 24.9 pg — AB (ref 26.0–34.0)
MCHC: 30.9 g/dL — AB (ref 32.0–36.0)
MCV: 81 fL (ref 80–100)
Monocyte #: 0.7 x10 3/mm (ref 0.2–0.9)
Monocyte %: 6.9 %
Neutrophil #: 6.5 x10 3/mm (ref 1.4–6.5)
Neutrophil %: 67.3 %
PLATELETS: 325 x10 3/mm (ref 150–440)
RBC: 3.81 10*6/uL (ref 3.80–5.20)
RDW: 20.9 % — ABNORMAL HIGH (ref 11.5–14.5)
WBC: 9.7 x10 3/mm (ref 3.6–11.0)

## 2014-10-03 LAB — CREATININE, SERUM
Creatinine: 1.2 mg/dL — ABNORMAL HIGH
EGFR (African American): 50 — ABNORMAL LOW
EGFR (Non-African Amer.): 44 — ABNORMAL LOW

## 2014-10-05 DIAGNOSIS — L98499 Non-pressure chronic ulcer of skin of other sites with unspecified severity: Secondary | ICD-10-CM | POA: Diagnosis not present

## 2014-10-05 DIAGNOSIS — L97112 Non-pressure chronic ulcer of right thigh with fat layer exposed: Secondary | ICD-10-CM | POA: Diagnosis not present

## 2014-10-07 NOTE — Consult Note (Signed)
PATIENT NAME:  Shelley Fields, Shelley Fields MR#:  [id number] DATE OF BIRTH:  11-07-1935  DATE OF CONSULTATION:  08/01/2014  REFERRING PHYSICIAN:   CONSULTING PHYSICIAN:  Kelby Fam. Dorena Cookey, PA-C  Dictating for Dionisio David, MD.  INDICATION FOR CONSULT: Congestive heart failure.   HISTORY OF PRESENT ILLNESS: This is a 79 year old female, well known to our practice who presented to the Emergency Room complaining of weakness and of a UTI. She states she frequently gets UTIs, but has no symptoms until she has severe weakness and fatigue. She denies any chest pain, pressure, tightness, shortness of breath, cough, orthopnea, or PND. She was found to have a white blood cell count of 13.1 in the ER, thus was admitted. The patient appeared to have some signs and symptoms on exam of CHF, thus cardiology was consulted.   PAST MEDICAL HISTORY: Coronary artery disease, last cardiac catheterization was July of 2012. It showed D1 of 80%, left circumflex 40%, RCA 30% with normal LVEF. Hyperlipidemia, hypertension, peripheral vascular disease. Had iliac endarterectomy in 1997, cervical and endometrial cancer, PE on chronic warfarin.  HOME MEDICATIONS: Atenolol 100 mg daily, clonidine 0.2 mg b.i.d., hydralazine 25 mg b.i.d., omeprazole 20 mg daily, Pletal 100 mg b.i.d., promethazine 25 mg q.6 p.r.n. nausea.   SOCIAL HISTORY: No tobacco or EtOH abuse.   ALLERGIES: No known drug allergies.   FAMILY HISTORY: No significant CAD or CHF.  REVIEW OF SYSTEMS:  CONSTITUTIONAL: Feeling much better, still slightly weak.  RESPIRATORY: No cough, wheezing, orthopnea, or PND.  CARDIOVASCULAR: No chest pain, pressure, tightness, palpitations.  GASTROINTESTINAL: No abdominal pain, nausea, or vomiting.   PHYSICAL EXAMINATION: VITAL SIGNS: Temperature 98.3, pulse 78, respirations 20, blood pressure 146/69, pulse oximetry 99% saturation on CPAP.  GENERAL: A and O x3, in no acute distress.  HEENT: No JVD or carotid bruit.  RESPIRATORY:  Good air entry bilaterally. No wheezes, rhonchi, rales, or crackles.  CARDIOVASCULAR: Normal S1, S2. No audible murmur.  GASTROINTESTINAL: Soft, nontender. Positive bowel sounds.  EXTREMITIES: 1+ pedal edema, right worse than left.   LABORATORY DATA: Creatinine 0.64. Troponin less than 0.02. WBC 13.2.  EKG shows normal sinus rhythm, 85 beats per minute, left atrial enlargement, poor R-wave progression, PVCs, nonspecific ST and T changes.   ASSESSMENT AND PLAN: Acute respiratory failure. The patient had echocardiogram performed in the office July 2015, which showed normal left ventricular ejection fraction 68%, mild to moderate mitral regurgitation, mild pulmonary insufficiency, mild tricuspid regurgitation, grade 1 diastolic dysfunction. Await echo results for left ventricular ejection fraction and wall motion. We will continue to follow.  Thank you very much for this consult.   ____________________________ Kelby Fam. Dorena Cookey, PA-C ear:sw D: 08/01/2014 11:48:40 ET T: 08/01/2014 12:03:18 ET JOB#: [id number]  cc: Dyann Ruddle A. Baldwin [place], <Dictator> Kelby Fam Brynnley Dayrit PA ELECTRONICALLY SIGNED 08/08/2014 14:54

## 2014-10-07 NOTE — Discharge Summary (Signed)
PATIENT NAME:  Shelley Fields, Shelley Fields MR#:  [id number] DATE OF BIRTH:  1935/08/06  DATE OF ADMISSION:  07/30/2014 DATE OF DISCHARGE:  08/06/2014  PRESENTING COMPLAINT: Generalized weakness and fever.   DISCHARGE DIAGNOSES:  1. Sepsis due to urinary tract infection.  2. Respiratory distress due to acute on chronic diastolic congestive heart failure.  3. Unstageable sacral ulcer and venous ulcers to the right leg.  4. Hypertension.  5. Chronic kidney disease, stage III.  6. Diabetes mellitus, type 2, insulin requiring.  7. History of pulmonary embolus, now on Coumadin.  8. Hyperkalemia.  9. Gastroesophageal reflux disease.  10. Irritable bowel syndrome.  11. Endometrial carcinoma currently followed by Dr. Inez Pilgrim.  12. Peripheral arterial disease.  13. Hyperlipidemia.  14. Chronic anemia of chronic disease.   CONSULTATIONS: Cardiology, Dr. Humphrey Rolls.   PROCEDURES:  1. CT scan of the left hip without contrast, February 19, shows no fracture, joint normally aligned. There is edema and contusion lateral to the greater trochanter. No other acute finding.  2. Chest x-ray, February 22, shows stable cardiomegaly.  3. Chest x-ray, February 23, shows central mild vascular congestion, mild interstitial prominence bilaterally suspicious for interstitial edema.  4. Chest x-ray, February 25, shows stable mild interstitial edema.  5. Chest x-ray, February 28, shows improved lung inflation. Question of residual focal opacity in the medial left lung base seen on prior studies.  6. A 2-D echocardiogram, February 23, shows left ventricular ejection fraction 55% to 60%. Elevated left atrial pressure. Impaired relaxation pattern of LV diastolic filling. Severely dilated left atrium. Moderately dilated right atrium. Pericardial effusion, trivial. Moderate mitral valve regurgitation. Mild aortic valve sclerosis without stenosis.   HISTORY OF PRESENT ILLNESS: This 79 year old female with history of hypertension, diabetes,  chronic kidney disease, gastroesophageal reflux disease, endometrial carcinoma currently followed by oncology, history of pulmonary embolism now on Coumadin, presents with complaint of generalized weakness and fever of 100 degrees. Emergency Room work-up revealed leukocytosis and positive urinalysis. She was started on IV antibiotics and admitted for further evaluation and treatment.   HOSPITAL COURSE BY PROBLEM:  1. Sepsis: Due to urinary tract infection. Urine cultures showed streptococcal agalactiae infection. She has received IV Rocephin. Chest x-ray negative for pneumonia. Blood cultures were not obtained. Clostridium difficile negative.  2. Urinary tract infection: The patient is incontinent of urine at baseline. Her urine culture showed Streptococcus agalactiae. She has received 7 days of IV Rocephin. The infection has been treated. Unfortunately, she does require a Foley catheter due to an unstageable sacral ulcer and contact dermatitis from urine. She is being discharged with the Foley catheter in place.  3. Unstageable sacral ulcer and lower extremity ulcerations: Wound consultation appreciated. Foley catheter in place to protect the sacral ulcer. Santyl ointment applied. She will need to be followed by wound care going forward.  4. Respiratory distress due to acute on chronic diastolic congestive heart failure: The patient did have hypoxia early on in her hospitalization with some interstitial edema visualized on chest x-ray. She responded well to diuresis, and, at the time of discharge, oxygen saturations are in the high 90s on 0.5 liters via nasal cannula. She is not in respiratory distress.  5. Hypertension: Blood pressure is fairly well controlled on a complex regimen of hydralazine, Amlodipine, clonidine and atenolol. She will resume taking Lasix every other day.  6. Chronic kidney disease, stage III: She had acute renal failure during the hospitalization due to diuresis. Creatinine is now  back to baseline of approximately 1.7.  A dose of lisinopril was given during hospitalization. This was immediately discontinued due to renal failure. She will not be on an ACE at discharge.  7. Diabetes mellitus, type 2: She was well controlled in hospital on higher doses of insulin. She was started on a short course of steroids due to respiratory distress, which did increase her blood sugars temporarily, this has resolved. She is discharged on 70/30 and blood sugar should be monitored 3 times a day and at bedtime.  8. Chronic diastolic dysfunction. The patient followed by cardiology throughout hospitalization. She continues on hydralazine, Norvasc and Lasix. Her ejection fraction is preserved with normal wall motion. 9. Generalized weakness: Physical therapy has evaluated the patient and recommended home health physical therapy. I have further evaluated the patient and determined that due to her need for chronic indwelling Foley, advanced wound care due to unstageable sacral ulcer and generalized weakness, as well as lack of support at home, she would be better served by going to a skilled nursing facility. She is being discharged to a skilled nursing facility today. 10. Endometrial cancer followed by Dr. Inez Pilgrim: The patient will have her port flushed today and will have follow-up instructions from Dr. Inez Pilgrim prior to discharge.   DISCHARGE PHYSICAL EXAMINATION:  VITAL SIGNS: Temperature 97.6, heart rate 70, respirations 20, blood pressure 147/65, oxygenation 98% on 0.5 liters nasal cannula.  GENERAL: No acute distress.  CARDIOVASCULAR: Regular rate and rhythm. No murmurs, rubs, or gallops. No peripheral edema. Peripheral pulses 1+.  RESPIRATORY: Lungs clear to auscultation bilaterally with good air movement.  ABDOMEN: Soft, nontender, nondistended. No guarding, no rebound. Bowel sounds normal.  EXTREMITIES: No edema. Peripheral pulses 2+. The left leg is bandaged around the ankle. The bandage is clean  and dry.  PSYCHIATRIC: The patient is alert and oriented with good insight into her clinical condition.   LABORATORY DATA: Sodium 145, potassium 4.8, chloride 109, bicarbonate 28, BUN 93, creatinine 1.73, glucose 129. White blood cells 14.3, hemoglobin 9.1, platelets 437,000. MCV is 81. INR is 3.0.   As noted above, Clostridium difficile is negative.   Blood cultures were not performed.   Urine culture with Streptococcus agalactiae.   DISCHARGE MEDICATIONS:  1. Acetaminophen 325 mg 1 tablet every 4 hours as needed for pain or fever.  2. Acetaminophen/hydrocodone 325/5 mg 1 tablet every 4 hours as needed for moderate pain.  3. Clonidine 0.2 mg twice a day.  4. Ferrous sulfate 325 mg once a day with meals. 5. Guaifenesin-dextromethorphan 1 tablet every 12 hours as needed for cough.  6. Lovastatin 40 mg with dinner.  7. Magnesium oxide 800 mg twice a day.  8. Meclizine 25 mg 3 times a day as needed for nausea, vomiting, or motion sickness. 9. Nystatin topical powder, apply to affected area twice a day.  10. Tamsulosin 0.4 mg 1 tablet twice a day.  11. Tramadol 50 mg every 8 hours as needed for moderate pain.  12. NovoLog 70/30 at 56 units at breakfast and 16 units at dinner.  13. Amlodipine 10 mg 1 tablet daily.  14. Atenolol 50 mg 1 tablet daily.  15. Hydralazine 100 mg twice a day.  16. Warfarin 1.5 mg daily. 17. Furosemide 20 mg 1 tablet every other day.  18. Loperamide 2 mg orally 3 times a day as needed for diarrhea.   CONDITION ON DISCHARGE: Stable.   DISPOSITION: Discharged to skilled nursing.   DISCHARGE INSTRUCTIONS:  DIET: Heart healthy, carbohydrate modified diet.   ACTIVITY: As tolerated.  FOLLOW-UP: Follow up as directed with Dr. Inez Pilgrim, oncology, and within 2 weeks with your primary care provider.   TIME SPENT ON DISCHARGE: 45 minutes.     ____________________________ Earleen Newport. Volanda Napoleon, MD cpw:JT D: 08/06/2014 09:15:03 ET T: 08/06/2014 09:47:50  ET JOB#: [id number]  cc: Barnetta Chapel P. Volanda Napoleon, MD, <Dictator> Aldean Jewett MD ELECTRONICALLY SIGNED 08/06/2014 15:45

## 2014-10-07 NOTE — H&P (Signed)
PATIENT NAME:  Shelley Fields, Shelley Fields MR#:  [id number] DATE OF BIRTH:  Apr 12, 1936  DATE OF ADMISSION:  07/30/2014  REFERRING PROVIDER: Ahmed Prima, MD  PRIMARY CARE PRACTITIONER: Arlis Porta., MD  ADMITTING DOCTOR: Juluis Mire, MD   CHIEF COMPLAINT: 1.  Generalized weakness and not feeling well.  2.  Found to have a temperature of 100 F by EMS.  HISTORY OF PRESENT ILLNESS: A 79 year old Caucasian female with a history of multiple medical problems including hypertension, diabetes mellitus type 2, CKD stage III, gastroesophageal reflux disease/IBS, chronic anemia, hyperlipidemia, peripheral arterial disease, pulmonary embolism on Coumadin, a history of endometrial carcinoma under the care of oncologist, presents to the Emergency Room with the complaints of generalized weakness and EMS found her with a temperature of 100 degrees Fahrenheit. In the Emergency Room, the patient was evaluated by the ED physician and was found and was noted to have a temperature of 100.9 and a work-up revealed elevated white blood cell count of 13.1 and a urinalysis significant for urinary tract infection. The patient was started on IV antibiotics and the hospitalist team was consulted for further evaluation and management. The patient denies any history of any abdominal pain. No history of any chest pain. No shortness of breath. No cough. No fever. No nausea. No vomiting. No diarrhea. No history of any dysuria. She does have some chronic frequency of urination.  PAST MEDICAL HISTORY: 1.  Hypertension.  2.  Diabetes mellitus, type 2.  3.  CKD, stage III.  4.  Gastroesophageal reflux disease/IBS.  5.  Chronic anemia.  6.  Hyperlipidemia.  7.  Peripheral artery disease.  8.  Pulmonary embolism, on Coumadin.  9.  Endometrial carcinoma.   PAST SURGICAL HISTORY: 1.  Bilateral cataract surgery.  2.  Bladder surgery.  3.  Hysterectomy.  4.  Right femoral endarterectomy.  5.  Right hip surgery.    ALLERGIES: No known drug allergies.   FAMILY HISTORY: Significant for a sister with breast cancer.   SOCIAL HISTORY: She is widowed, lives at home, ambulates with the help of walker. No history of any smoking, alcohol, or substance abuse.    HOME MEDICATIONS: 1.  Acetaminophen/hydrocodone 325 mg 1 tablet every 6 hours as needed for pain.  2.  Amlodipine 5 mg 1 tablet orally once a day.  3.  Atenolol 100 mg 1 tablet orally once a day.  4.  Clonidine 0.2 mg 1 tablet orally 2 times a day.  5.  Ferrous sulfate 325 mg 1 tablet orally once a day.  6.  Furosemide 20 mg 1 tablet orally once a day.  7.  Hydralazine 25 mg oral tablet 1 tablet orally 2 times a day.  8.  Lovastatin 40 mg 1 tablet orally once a day.  9.  Magnesium oxide 400 mg 2 tablets orally once a day.  10.  Meclizine 25 mg 1 tablet orally 3 times a day as needed for dizziness.  11.  Mucinex DM 30/600 mg extended-release 1 tablet every 12 hours.  12.  Novolin 70/30 at 58 units subcutaneous in the morning.  13.  Novolin 16 units subcutaneously evening.  14.  Potassium chloride 10 mEq q. 1 tablet orally once a day.  15.  ProAir HFA 90 mcg/inhalations 2 puffs 4 times a day as needed.  16.  Probiotic formula 1 capsule orally once a day.  17.  Promethazine 25 mg 1 tablet orally every 6 hours as needed for nausea and vomiting.  18.  Santyl 250 units topical ointment, apply to affected area once a day.  19.  Tamsulosin 4.4 mg 1 tablet orally 2 times a day.  20.  Tramadol 50 mg 1 to 2 tablets orally 3 times a day as needed for pain. 21.  Warfarin 1 mg tablet 1.5 mg tablet orally once a day.   REVIEW OF SYSTEMS: CONSTITUTIONAL: Positive for fever of 100 degrees Fahrenheit. She does have generalized weakness.  EYES: Negative for blurred vision, double vision. No pain. No redness. No discharge.  EARS, NOSE, AND THROAT: Negative for tinnitus, ear pain, hearing loss, epistaxis, nasal discharge.  RESPIRATORY: Negative for cough,  wheezing, dyspnea, hemoptysis, or painful respiration.  CARDIOVASCULAR: Negative for chest pain, palpitation, dizziness, syncopal episodes, orthopnea, dyspnea on exertion, pedal edema.  GASTROINTESTINAL: Negative for nausea, vomiting, diarrhea, constipation, abdominal pain, hematemesis, melena, or rectal bleeding.  GENITOURINARY: Negative for dysuria, but chronic frequency and urgency present.  ENDOCRINE: Negative for polyuria, nocturia, heat or cold intolerance.  HEMATOLOGIC: Positive for chronic anemia on iron supplements. No bruising, bleeding.  INTEGUMENTARY: Negative for acne, skin rash, or lesions.  MUSCULOSKELETAL: Negative for back pain, neck pain.  NEUROLOGICAL: Negative for focal weakness, numbness. No history of CVA, TIA.  PSYCHIATRIC: Negative for anxiety, insomnia, or depression.   PHYSICAL EXAMINATION: VITAL SIGNS: Temperature 100.9 degrees Fahrenheit, pulse rate 81 per minute, respirations 18 per minute, blood pressure 156/48, O2 oxygen saturation 96% on room air.  GENERAL: Well-developed, well-nourished, alert, in no acute distress, comfortably resting in the bed.  HEAD: Atraumatic, normocephalic.  EYES: Pupils are equal, react to light and accommodation. No conjunctival pallor. No icterus. Extraocular movements intact. NOSE: No drainage. No lesions.  EARS: No drainage. No external lesions.  ORAL CAVITY: No mucosal lesions. No exudates.  NECK: Supple. No JVD. No thyromegaly. No carotid bruits. Range of motion of neck within normal limits.  RESPIRATORY: Good respiratory effort. Not using accessory muscles of respiration. Bilateral vesicular breath sounds. No rales or rhonchi.  CARDIOVASCULAR: S1, S2 regular. No murmurs, gallops, or clicks. Peripheral pulses equal.  1+ pedal edema.  GASTROINTESTINAL: Abdomen is soft and nontender. No hepatosplenomegaly. No masses. No rigidity. No guarding. Bowel sounds present and equal in all 4 quadrants.  GENITOURINARY: Deferred.   MUSCULOSKELETAL: No joint tenderness or effusion. Range of motion adequate.  SKIN: Inspection of chronic ulcer left lower extremity, which is about 3 inches in width with slough at the base of the ulcer present.  LYMPHATIC: No cervical lymphadenopathy.  VASCULAR: Good dorsalis pedis and posterior tibial pulses.  NEUROLOGICAL: Alert, awake, and oriented x3. Cranial nerves II through XII grossly intact. No sensory deficit. Motor strength 5/5 in both upper and lower extremities. DTRs 2+ bilaterally.  PSYCHIATRIC: Alert, awake, and oriented x3. Judgment and insight adequate. Memory and mood within normal limits.   LABORATORY DATA: Serum glucose 104, BUN 32, creatinine 1.67, sodium 138, potassium 4.7, chloride 105, bicarbonate 26, total calcium 8.6, lipase 72, total protein 7.3, albumin 2.2, total bilirubin 0.2, alkaline phosphatase 40, AST 40, ALT 8. WBC 13.1, hemoglobin 8.9, hematocrit 28.1, platelet count 378,000.   URINALYSIS: 3+ leukocyte esterase. WBC is 316, bacteria 3+.   IMAGING STUDIES: 1.  CHEST X-RAY: No acute cardiopulmonary disease.  2.  EKG: Normal sinus rhythm with ventricular rate of 85 beats per minute. No acute ST-T changes.   ASSESSMENT AND PLAN: A 79 year old Caucasian female with a history of multiple medical problems presents with generalized weakness, found to have sepsis with elevated temperature, leukocytosis, and urinary  tract infection.  1.  Sepsis secondary to urinary tract infection.  Plan: Blood and urine cultures. Continue intravenous Rocephin and Tylenol. Follow up cultures.  2.  Chronic wound, left lower extremity being followed by wound care team. Continue wound care.  3.  Hypertension, stable on home medication. Continue same. 4.  History of chronic kidney disease, stage III. Creatinine mildly high, but stable. Continue close monitoring. Follow up BMP.  5.  Diabetes mellitus type 2 on insulin, stable. Continue home medications, plus sliding scale insulin.  6.   Endometrial carcinoma, under the care of oncology, under consideration for initiation of chemotherapy shortly. No acute problems at this time.  7.  Chronic anemia on iron supplements. Hemoglobin and hematocrit  low, but stable compared to the previous values.  8.  History of pulmonary embolism, on Coumadin. Continue same.  9.  Hyperlipidemia on statin. Continue same.  10.  Deep vein thrombosis prophylaxis. Coumadin.  11.  Gastrointestinal prophylaxis: Proton pump inhibitor. 12.  CODE STATUS: FULL CODE.   TIME SPENT: 50 minutes.    ____________________________ Juluis Mire, MD enr:sw D: 07/30/2014 06:41:09 ET T: 07/30/2014 07:03:08 ET JOB#: [id number]  cc: Juluis Mire, MD, <Dictator> Arlis Porta., MD Juluis Mire MD ELECTRONICALLY SIGNED 07/30/2014 17:03

## 2014-10-07 NOTE — Op Note (Signed)
PATIENT NAME:  Shelley Fields, Shelley Fields MR#:  [id number] DATE OF BIRTH:  [birth year]  DATE OF PROCEDURE:  09/04/2014  PREOPERATIVE DIAGNOSES: Atherosclerotic occlusive disease, bilateral lower extremities, with ulceration of the left foot.   POSTOPERATIVE DIAGNOSES: Atherosclerotic occlusive disease, bilateral lower extremities, with ulceration of the left foot.   PROCEDURES PERFORMED: 1.  Abdominal aortogram.  2.  Left lower extremity distal runoff, third order catheter placement.  3.  Percutaneous transluminal angioplasty and stent placement, left external iliac artery.  4.  Percutaneous transluminal angioplasty of the left superficial femoral artery and common femoral artery.   SURGEON: Katha Cabal, MD   SEDATION: Versed plus fentanyl.   CONTRAST USED: Isovue 75 mL.   FLUOROSCOPY TIME: 8.8 minutes.   INDICATIONS: Shelley Fields is a 79 year old woman who presented to the office with ulceration and nonpalpable pulses and of the left foot. Physical examination as well as noninvasive studies demonstrate significant atherosclerotic occlusive disease and she is, therefore, undergoing angiography with the hope for intervention for limb salvage. The risks and benefits are reviewed. All questions answered. The patient agrees to proceed.   DESCRIPTION OF PROCEDURE: The patient is taken to the special procedure suite, placed in the supine position. After adequate sedation is achieved, both groins are prepped and draped in a sterile fashion. Ultrasound is placed in a sterile sleeve. Common femoral artery on the right identified. It is echolucent and pulsatile indicating patency. Image is recorded for the permanent record. Under real-time visualization, the access is obtained with a micropuncture needle, microwire followed by micro sheath, J-wire followed by a 5 French sheath and 5 French pigtail catheter.   The pigtail catheter is positioned at the level of T12 and an AP projection of the aorta is  obtained. Pigtail catheter is repositioned to above the bifurcation and an RAO projection of the pelvis is obtained.   Using a stiff angled Glidewire and the pigtail catheter, the aortic bifurcation is crossed; however, the wire is negotiated down to the common femoral but the catheter will not track and, therefore, a 4 French straight glide catheter is advanced over the wire. Once the catheter is positioned in the common femoral, oblique view of the left groin is obtained in the LAO projection. This demonstrates occlusion of the profunda but patency of the SFA with critical stenosis in its proximal several centimeters. The catheter is then negotiated down into the SFA and distal runoff is performed. A 300 Versacore wire is then introduced. The catheter is removed and 3000 units of heparin are given. The 5 French sheath is then exchanged for a 6 [place], which is positioned in the distal common iliac.   A 6 x 4 Lutonix balloon is advanced across the subtotal occlusion within the external iliac; this inflation is to 14 atmospheres for approximately 2-1/2 minutes. A second 6 x 4 balloon is positioned more distally in the external and, again, a 14 atmosphere inflation for 2 minutes is performed. The 5 x 100 Lutonix balloon is then positioned in the SFA where the inflation is to 14 atmospheres for 3 full minutes. Followup imaging from the distal common through the proximal half of the SFA demonstrates significant improvement through the SFA and common femoral; however, the initial lesion in the external iliac is clearly is still greater than 60% narrowed and therefore a 7 x 40 LifeStar stent is deployed across this area and postdilated with the a 6 x 2 [place] balloon inflated to 22 atmospheres. Followup imaging now demonstrates  significant improvement in flow. The common femoral lesions and the superficial femoral lesions, although irregular appear to have less than 20% residual stenosis. Distal runoff was  reassessed by catheter injection in the SFA and this is preserved.   The sheath is then pulled into the external iliac on the right. Oblique view is obtained and attempt at using a Mynx device was made; however, the balloon ruptures on the coarse plaque noted in the distal external on the right and, therefore, we elect to hold pressure and place a safeguard.   INTERPRETATION: The abdominal aorta, bilateral common iliacs, and external iliac arteries are imaged. There is diffuse calcific disease throughout the entire system. There is a subtotal occlusion in the proximal external iliac on the left. There is a greater than 70% narrowing in the distal external. There is greater than 70% narrowing in the common femoral, as well as the proximal 3-4 cm of the superficial femoral artery. The profunda femoris on the left is occluded at its origin and does not appear to reconstitute. The superficial femoral artery below the level of the above-noted lesion appears calcified but with normal caliber. It is patent through the popliteal down to the trifurcation. Anterior tibial appears to be patent down to the foot, filling the dorsalis pedis. Tibioperoneal trunk is occluded as are the posterior tibial and peroneal. There does not appear to be any distal reconstitution of these 2 tibials.   Following angioplasty, there remains a very high-grade stenosis in the external iliac and, therefore, a stent is placed and subsequently a 22 atmosphere inflation is performed with a significant improvement. Angioplasty using the Lutonix balloon within the superficial femoral artery and common femoral is successful at 14 atmospheres.   SUMMARY: Successful recanalization of the left lower extremity for limb salvage.   ____________________________ Katha Cabal, MD ggs:bm D: 09/04/2014 17:00:46 ET T: 09/05/2014 03:53:24 ET JOB#: [id number]  cc: Katha Cabal, MD, <Dictator> Arlis Porta., MD Katha Cabal  MD ELECTRONICALLY SIGNED 09/18/2014 15:10

## 2014-10-08 DIAGNOSIS — I739 Peripheral vascular disease, unspecified: Secondary | ICD-10-CM | POA: Diagnosis not present

## 2014-10-08 DIAGNOSIS — L97309 Non-pressure chronic ulcer of unspecified ankle with unspecified severity: Secondary | ICD-10-CM | POA: Diagnosis not present

## 2014-10-08 DIAGNOSIS — I83209 Varicose veins of unspecified lower extremity with both ulcer of unspecified site and inflammation: Secondary | ICD-10-CM | POA: Diagnosis not present

## 2014-10-11 DIAGNOSIS — E119 Type 2 diabetes mellitus without complications: Secondary | ICD-10-CM | POA: Diagnosis not present

## 2014-10-11 DIAGNOSIS — E784 Other hyperlipidemia: Secondary | ICD-10-CM | POA: Diagnosis not present

## 2014-10-11 DIAGNOSIS — I10 Essential (primary) hypertension: Secondary | ICD-10-CM | POA: Diagnosis not present

## 2014-10-11 DIAGNOSIS — I739 Peripheral vascular disease, unspecified: Secondary | ICD-10-CM | POA: Diagnosis not present

## 2014-10-12 DIAGNOSIS — L853 Xerosis cutis: Secondary | ICD-10-CM | POA: Diagnosis not present

## 2014-10-17 DIAGNOSIS — I739 Peripheral vascular disease, unspecified: Secondary | ICD-10-CM | POA: Diagnosis not present

## 2014-10-17 DIAGNOSIS — I10 Essential (primary) hypertension: Secondary | ICD-10-CM | POA: Diagnosis not present

## 2014-10-17 DIAGNOSIS — E784 Other hyperlipidemia: Secondary | ICD-10-CM | POA: Diagnosis not present

## 2014-10-17 DIAGNOSIS — E119 Type 2 diabetes mellitus without complications: Secondary | ICD-10-CM | POA: Diagnosis not present

## 2014-10-17 DIAGNOSIS — D649 Anemia, unspecified: Secondary | ICD-10-CM | POA: Diagnosis not present

## 2014-10-17 DIAGNOSIS — I509 Heart failure, unspecified: Secondary | ICD-10-CM | POA: Diagnosis not present

## 2014-10-19 DIAGNOSIS — L89150 Pressure ulcer of sacral region, unstageable: Secondary | ICD-10-CM | POA: Diagnosis not present

## 2014-10-19 DIAGNOSIS — I70244 Atherosclerosis of native arteries of left leg with ulceration of heel and midfoot: Secondary | ICD-10-CM | POA: Diagnosis not present

## 2014-10-27 DIAGNOSIS — I4891 Unspecified atrial fibrillation: Secondary | ICD-10-CM | POA: Diagnosis not present

## 2014-10-27 DIAGNOSIS — N393 Stress incontinence (female) (male): Secondary | ICD-10-CM | POA: Diagnosis not present

## 2014-10-27 DIAGNOSIS — E119 Type 2 diabetes mellitus without complications: Secondary | ICD-10-CM | POA: Diagnosis not present

## 2014-10-27 DIAGNOSIS — I739 Peripheral vascular disease, unspecified: Secondary | ICD-10-CM | POA: Diagnosis not present

## 2014-10-27 DIAGNOSIS — L89322 Pressure ulcer of left buttock, stage 2: Secondary | ICD-10-CM | POA: Diagnosis not present

## 2014-10-27 DIAGNOSIS — D649 Anemia, unspecified: Secondary | ICD-10-CM | POA: Diagnosis not present

## 2014-10-27 DIAGNOSIS — L989 Disorder of the skin and subcutaneous tissue, unspecified: Secondary | ICD-10-CM | POA: Diagnosis not present

## 2014-10-27 DIAGNOSIS — I5032 Chronic diastolic (congestive) heart failure: Secondary | ICD-10-CM | POA: Diagnosis not present

## 2014-10-27 DIAGNOSIS — I10 Essential (primary) hypertension: Secondary | ICD-10-CM | POA: Diagnosis not present

## 2014-10-27 DIAGNOSIS — L89622 Pressure ulcer of left heel, stage 2: Secondary | ICD-10-CM | POA: Diagnosis not present

## 2014-10-27 DIAGNOSIS — L89312 Pressure ulcer of right buttock, stage 2: Secondary | ICD-10-CM | POA: Diagnosis not present

## 2014-10-27 DIAGNOSIS — M25552 Pain in left hip: Secondary | ICD-10-CM | POA: Diagnosis not present

## 2014-10-27 DIAGNOSIS — F328 Other depressive episodes: Secondary | ICD-10-CM | POA: Diagnosis not present

## 2014-10-28 DIAGNOSIS — I5032 Chronic diastolic (congestive) heart failure: Secondary | ICD-10-CM | POA: Diagnosis not present

## 2014-10-28 DIAGNOSIS — L989 Disorder of the skin and subcutaneous tissue, unspecified: Secondary | ICD-10-CM | POA: Diagnosis not present

## 2014-10-28 DIAGNOSIS — N393 Stress incontinence (female) (male): Secondary | ICD-10-CM | POA: Diagnosis not present

## 2014-10-28 DIAGNOSIS — L89312 Pressure ulcer of right buttock, stage 2: Secondary | ICD-10-CM | POA: Diagnosis not present

## 2014-10-28 DIAGNOSIS — D649 Anemia, unspecified: Secondary | ICD-10-CM | POA: Diagnosis not present

## 2014-10-28 DIAGNOSIS — I4891 Unspecified atrial fibrillation: Secondary | ICD-10-CM | POA: Diagnosis not present

## 2014-10-28 DIAGNOSIS — M25552 Pain in left hip: Secondary | ICD-10-CM | POA: Diagnosis not present

## 2014-10-28 DIAGNOSIS — I739 Peripheral vascular disease, unspecified: Secondary | ICD-10-CM | POA: Diagnosis not present

## 2014-10-28 DIAGNOSIS — E119 Type 2 diabetes mellitus without complications: Secondary | ICD-10-CM | POA: Diagnosis not present

## 2014-10-28 DIAGNOSIS — L89622 Pressure ulcer of left heel, stage 2: Secondary | ICD-10-CM | POA: Diagnosis not present

## 2014-10-28 DIAGNOSIS — I10 Essential (primary) hypertension: Secondary | ICD-10-CM | POA: Diagnosis not present

## 2014-10-28 DIAGNOSIS — L89322 Pressure ulcer of left buttock, stage 2: Secondary | ICD-10-CM | POA: Diagnosis not present

## 2014-10-28 DIAGNOSIS — F328 Other depressive episodes: Secondary | ICD-10-CM | POA: Diagnosis not present

## 2014-10-31 DIAGNOSIS — D649 Anemia, unspecified: Secondary | ICD-10-CM | POA: Diagnosis not present

## 2014-10-31 DIAGNOSIS — L89622 Pressure ulcer of left heel, stage 2: Secondary | ICD-10-CM | POA: Diagnosis not present

## 2014-10-31 DIAGNOSIS — I10 Essential (primary) hypertension: Secondary | ICD-10-CM | POA: Diagnosis not present

## 2014-10-31 DIAGNOSIS — I5032 Chronic diastolic (congestive) heart failure: Secondary | ICD-10-CM | POA: Diagnosis not present

## 2014-10-31 DIAGNOSIS — L89322 Pressure ulcer of left buttock, stage 2: Secondary | ICD-10-CM | POA: Diagnosis not present

## 2014-10-31 DIAGNOSIS — E119 Type 2 diabetes mellitus without complications: Secondary | ICD-10-CM | POA: Diagnosis not present

## 2014-10-31 DIAGNOSIS — M25552 Pain in left hip: Secondary | ICD-10-CM | POA: Diagnosis not present

## 2014-10-31 DIAGNOSIS — L989 Disorder of the skin and subcutaneous tissue, unspecified: Secondary | ICD-10-CM | POA: Diagnosis not present

## 2014-10-31 DIAGNOSIS — R3914 Feeling of incomplete bladder emptying: Secondary | ICD-10-CM | POA: Diagnosis not present

## 2014-10-31 DIAGNOSIS — K589 Irritable bowel syndrome without diarrhea: Secondary | ICD-10-CM | POA: Diagnosis not present

## 2014-10-31 DIAGNOSIS — I4891 Unspecified atrial fibrillation: Secondary | ICD-10-CM | POA: Diagnosis not present

## 2014-10-31 DIAGNOSIS — N393 Stress incontinence (female) (male): Secondary | ICD-10-CM | POA: Diagnosis not present

## 2014-10-31 DIAGNOSIS — L89312 Pressure ulcer of right buttock, stage 2: Secondary | ICD-10-CM | POA: Diagnosis not present

## 2014-10-31 DIAGNOSIS — F328 Other depressive episodes: Secondary | ICD-10-CM | POA: Diagnosis not present

## 2014-10-31 DIAGNOSIS — I739 Peripheral vascular disease, unspecified: Secondary | ICD-10-CM | POA: Diagnosis not present

## 2014-11-01 DIAGNOSIS — I739 Peripheral vascular disease, unspecified: Secondary | ICD-10-CM | POA: Diagnosis not present

## 2014-11-01 DIAGNOSIS — M25552 Pain in left hip: Secondary | ICD-10-CM | POA: Diagnosis not present

## 2014-11-01 DIAGNOSIS — E119 Type 2 diabetes mellitus without complications: Secondary | ICD-10-CM | POA: Diagnosis not present

## 2014-11-01 DIAGNOSIS — F328 Other depressive episodes: Secondary | ICD-10-CM | POA: Diagnosis not present

## 2014-11-01 DIAGNOSIS — L989 Disorder of the skin and subcutaneous tissue, unspecified: Secondary | ICD-10-CM | POA: Diagnosis not present

## 2014-11-01 DIAGNOSIS — N393 Stress incontinence (female) (male): Secondary | ICD-10-CM | POA: Diagnosis not present

## 2014-11-01 DIAGNOSIS — D649 Anemia, unspecified: Secondary | ICD-10-CM | POA: Diagnosis not present

## 2014-11-01 DIAGNOSIS — I4891 Unspecified atrial fibrillation: Secondary | ICD-10-CM | POA: Diagnosis not present

## 2014-11-01 DIAGNOSIS — I5032 Chronic diastolic (congestive) heart failure: Secondary | ICD-10-CM | POA: Diagnosis not present

## 2014-11-01 DIAGNOSIS — I10 Essential (primary) hypertension: Secondary | ICD-10-CM | POA: Diagnosis not present

## 2014-11-01 DIAGNOSIS — L89622 Pressure ulcer of left heel, stage 2: Secondary | ICD-10-CM | POA: Diagnosis not present

## 2014-11-01 DIAGNOSIS — L89312 Pressure ulcer of right buttock, stage 2: Secondary | ICD-10-CM | POA: Diagnosis not present

## 2014-11-01 DIAGNOSIS — L89322 Pressure ulcer of left buttock, stage 2: Secondary | ICD-10-CM | POA: Diagnosis not present

## 2014-11-02 ENCOUNTER — Other Ambulatory Visit: Payer: Self-pay | Admitting: *Deleted

## 2014-11-02 ENCOUNTER — Inpatient Hospital Stay: Payer: Medicare Other | Attending: Family Medicine

## 2014-11-02 ENCOUNTER — Inpatient Hospital Stay: Payer: Medicare Other

## 2014-11-02 ENCOUNTER — Inpatient Hospital Stay (HOSPITAL_BASED_OUTPATIENT_CLINIC_OR_DEPARTMENT_OTHER): Payer: Medicare Other | Admitting: Family Medicine

## 2014-11-02 ENCOUNTER — Encounter: Payer: Self-pay | Admitting: Internal Medicine

## 2014-11-02 ENCOUNTER — Encounter (INDEPENDENT_AMBULATORY_CARE_PROVIDER_SITE_OTHER): Payer: Self-pay

## 2014-11-02 VITALS — BP 131/72 | HR 65 | Temp 97.2°F | Resp 16

## 2014-11-02 DIAGNOSIS — K769 Liver disease, unspecified: Secondary | ICD-10-CM | POA: Insufficient documentation

## 2014-11-02 DIAGNOSIS — R109 Unspecified abdominal pain: Secondary | ICD-10-CM

## 2014-11-02 DIAGNOSIS — C55 Malignant neoplasm of uterus, part unspecified: Secondary | ICD-10-CM | POA: Diagnosis not present

## 2014-11-02 DIAGNOSIS — R531 Weakness: Secondary | ICD-10-CM

## 2014-11-02 DIAGNOSIS — L989 Disorder of the skin and subcutaneous tissue, unspecified: Secondary | ICD-10-CM | POA: Diagnosis not present

## 2014-11-02 DIAGNOSIS — I739 Peripheral vascular disease, unspecified: Secondary | ICD-10-CM | POA: Diagnosis not present

## 2014-11-02 DIAGNOSIS — Z79899 Other long term (current) drug therapy: Secondary | ICD-10-CM | POA: Insufficient documentation

## 2014-11-02 DIAGNOSIS — M25552 Pain in left hip: Secondary | ICD-10-CM | POA: Diagnosis not present

## 2014-11-02 DIAGNOSIS — L89312 Pressure ulcer of right buttock, stage 2: Secondary | ICD-10-CM | POA: Diagnosis not present

## 2014-11-02 DIAGNOSIS — I5032 Chronic diastolic (congestive) heart failure: Secondary | ICD-10-CM | POA: Diagnosis not present

## 2014-11-02 DIAGNOSIS — R197 Diarrhea, unspecified: Secondary | ICD-10-CM | POA: Diagnosis not present

## 2014-11-02 DIAGNOSIS — C541 Malignant neoplasm of endometrium: Secondary | ICD-10-CM

## 2014-11-02 DIAGNOSIS — Z86711 Personal history of pulmonary embolism: Secondary | ICD-10-CM | POA: Diagnosis not present

## 2014-11-02 DIAGNOSIS — E119 Type 2 diabetes mellitus without complications: Secondary | ICD-10-CM | POA: Diagnosis not present

## 2014-11-02 DIAGNOSIS — R5383 Other fatigue: Secondary | ICD-10-CM

## 2014-11-02 DIAGNOSIS — Z7901 Long term (current) use of anticoagulants: Secondary | ICD-10-CM

## 2014-11-02 DIAGNOSIS — L89622 Pressure ulcer of left heel, stage 2: Secondary | ICD-10-CM | POA: Diagnosis not present

## 2014-11-02 DIAGNOSIS — Z8614 Personal history of Methicillin resistant Staphylococcus aureus infection: Secondary | ICD-10-CM

## 2014-11-02 DIAGNOSIS — N393 Stress incontinence (female) (male): Secondary | ICD-10-CM | POA: Diagnosis not present

## 2014-11-02 DIAGNOSIS — F328 Other depressive episodes: Secondary | ICD-10-CM | POA: Diagnosis not present

## 2014-11-02 DIAGNOSIS — L89322 Pressure ulcer of left buttock, stage 2: Secondary | ICD-10-CM | POA: Diagnosis not present

## 2014-11-02 DIAGNOSIS — Z87440 Personal history of urinary (tract) infections: Secondary | ICD-10-CM | POA: Insufficient documentation

## 2014-11-02 DIAGNOSIS — I4891 Unspecified atrial fibrillation: Secondary | ICD-10-CM | POA: Diagnosis not present

## 2014-11-02 DIAGNOSIS — Z794 Long term (current) use of insulin: Secondary | ICD-10-CM | POA: Diagnosis not present

## 2014-11-02 DIAGNOSIS — D649 Anemia, unspecified: Secondary | ICD-10-CM | POA: Diagnosis not present

## 2014-11-02 DIAGNOSIS — I10 Essential (primary) hypertension: Secondary | ICD-10-CM | POA: Diagnosis not present

## 2014-11-02 DIAGNOSIS — C801 Malignant (primary) neoplasm, unspecified: Secondary | ICD-10-CM

## 2014-11-02 LAB — HEPATIC FUNCTION PANEL
ALT: 10 U/L — AB (ref 14–54)
AST: 23 U/L (ref 15–41)
Albumin: 2.2 g/dL — ABNORMAL LOW (ref 3.5–5.0)
Alkaline Phosphatase: 50 U/L (ref 38–126)
BILIRUBIN TOTAL: 0.3 mg/dL (ref 0.3–1.2)
Bilirubin, Direct: <0.1 mg/dL — ABNORMAL LOW (ref 0.1–0.5)
Total Protein: 6.1 g/dL — ABNORMAL LOW (ref 6.5–8.1)

## 2014-11-02 LAB — POTASSIUM: Potassium: 3.9 mmol/L (ref 3.5–5.1)

## 2014-11-02 LAB — CBC WITH DIFFERENTIAL/PLATELET
BASOS PCT: 1 %
Basophils Absolute: 0.1 10*3/uL (ref 0–0.1)
Eosinophils Absolute: 0.1 10*3/uL (ref 0–0.7)
Eosinophils Relative: 1 %
HCT: 29.2 % — ABNORMAL LOW (ref 35.0–47.0)
Hemoglobin: 9.3 g/dL — ABNORMAL LOW (ref 12.0–16.0)
Lymphocytes Relative: 30 %
Lymphs Abs: 2.5 10*3/uL (ref 1.0–3.6)
MCH: 26 pg (ref 26.0–34.0)
MCHC: 31.8 g/dL — ABNORMAL LOW (ref 32.0–36.0)
MCV: 81.6 fL (ref 80.0–100.0)
Monocytes Absolute: 0.6 10*3/uL (ref 0.2–0.9)
Monocytes Relative: 8 %
NEUTROS ABS: 5.3 10*3/uL (ref 1.4–6.5)
Neutrophils Relative %: 60 %
Platelets: 322 10*3/uL (ref 150–440)
RBC: 3.58 MIL/uL — ABNORMAL LOW (ref 3.80–5.20)
RDW: 21.4 % — ABNORMAL HIGH (ref 11.5–14.5)
WBC: 8.6 10*3/uL (ref 3.6–11.0)

## 2014-11-02 LAB — CREATININE, SERUM
Creatinine, Ser: 1.27 mg/dL — ABNORMAL HIGH (ref 0.44–1.00)
GFR calc Af Amer: 46 mL/min — ABNORMAL LOW (ref >60)
GFR calc non Af Amer: 40 mL/min — ABNORMAL LOW (ref >60)

## 2014-11-02 MED ORDER — SODIUM CHLORIDE 0.9 % IJ SOLN
10.0000 mL | INTRAMUSCULAR | Status: AC | PRN
  Administered 2014-11-02: 10 mL via INTRAVENOUS
  Filled 2014-11-02: qty 10

## 2014-11-02 MED ORDER — HEPARIN SOD (PORK) LOCK FLUSH 100 UNIT/ML IV SOLN
500.0000 [IU] | Freq: Once | INTRAVENOUS | Status: AC
  Administered 2014-11-02: 500 [IU] via INTRAVENOUS

## 2014-11-02 NOTE — Progress Notes (Signed)
[place]  [phone]  Fax:([phone]) Newton Hamilton: [phone]  MR#: [id number]  POE#:[id number]  Patient Care Team: Arlis Porta., MD as PCP - General (Family Medicine)  CHIEF COMPLAINT:  Chief Complaint  Patient presents with  . Follow-up    Is back at home now from a 3 month stint at rehab with home health nursing...    INTERVAL HISTORY:  Patient is here for further evaluation endometrial cancer. She was just discharged home from skilled nursing for rehabilitation. Reports overall she is well but has some increasing abdominal pain.   REVIEW OF SYSTEMS:   Review of Systems  Constitutional: Positive for malaise/fatigue.  HENT: Negative.   Respiratory: Negative for cough, shortness of breath and wheezing.   Cardiovascular: Negative.   Gastrointestinal: Positive for abdominal pain.  Musculoskeletal: Negative for myalgias and falls.  Skin: Negative.   Neurological: Positive for weakness.  Endo/Heme/Allergies: Negative.     As per HPI. Otherwise, a complete review of systems is negatve.  ONCOLOGY HISTORY: Oncology History   1.  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 restage as stage 4 based on likely liver metastasis , later recurrent cancer to cervix, ER neg HER 2 neg  2.  Pulmonary embolism on Coumadin therapy.   (diagnosis in March of 2012  3.  Delayed healing wound, AND MRSA wound. 4.  Previous history of carcinoma of cervix status post surgical intervention and radiation therapy.                                  5. Chronic diarrhea, recurrrent episodes bowel obstruction, recurrent c diff infection, wound dehiscience,   Liver density on CT right lobe possible metastatic lesion, improved after  tx     6. Labile creatinine  7.Prior episode cardiac arrest/asystole 8. Recurrent UTI       Cancer of endometrium   01/06/2012 Initial Diagnosis  Cancer of endometrium    PAST MEDICAL HISTORY: No past medical history on file.  PAST SURGICAL HISTORY: No past surgical history on file.  FAMILY HISTORY No family history on file.  GYNECOLOGIC HISTORY:  No LMP recorded. Patient is postmenopausal.     ADVANCED DIRECTIVES:    HEALTH MAINTENANCE: History  Substance Use Topics  . Smoking status: Never Smoker   . Smokeless tobacco: Not on file  . Alcohol Use: Not on file     Colonoscopy:  PAP:  Bone density:  Lipid panel:  Allergies not on file  Current Outpatient Prescriptions  Medication Sig Dispense Refill  . amLODipine (NORVASC) 10 MG tablet Take 10 mg by mouth daily.    Marland Kitchen atenolol (TENORMIN) 50 MG tablet Take 50 mg by mouth daily.    . benazepril (LOTENSIN) 40 MG tablet Take 40 mg by mouth daily.    . cloNIDine (CATAPRES) 0.2 MG tablet Take 0.2 mg by mouth 2 (two) times daily.    . furosemide (LASIX) 20 MG tablet Take 20 mg by mouth every other day.    Marland Kitchen HUMULIN 70/30 (70-30) 100 UNIT/ML injection 16 Units daily at 6 PM.    . hydrALAZINE (APRESOLINE) 100 MG tablet Take 100 mg by mouth 2 (two) times daily.    Marland Kitchen lovastatin (MEVACOR) 20 MG tablet Take 20 mg by mouth daily.    . meclizine (ANTIVERT)  25 MG tablet Take 25 mg by mouth daily.    . mirtazapine (REMERON) 15 MG tablet Take 15 mg by mouth daily.    Marland Kitchen NOVOLOG FLEXPEN 100 UNIT/ML FlexPen Per Sliding Scale...    . nystatin (MYCOSTATIN) powder     . ONE TOUCH ULTRA TEST test strip     . potassium chloride (K-DUR) 10 MEQ tablet Take 10 mEq by mouth every other day.    . promethazine (PHENERGAN) 12.5 MG tablet Take 12.5 mg by mouth every 6 (six) hours as needed.    Marland Kitchen SANTYL ointment     . tamsulosin (FLOMAX) 0.4 MG CAPS capsule Take 0.4 mg by mouth 2 (two) times daily.    Marland Kitchen warfarin (COUMADIN) 1 MG tablet Take 0.5 mg by mouth daily.    Marland Kitchen omeprazole (PRILOSEC) 20 MG capsule Take 20 mg by mouth daily.  12   No current facility-administered medications for this  visit.   Facility-Administered Medications Ordered in Other Visits  Medication Dose Route Frequency Provider Last Rate Last Dose  . sodium chloride 0.9 % injection 10 mL  10 mL Intravenous PRN Evlyn Kanner, NP   10 mL at 11/02/14 0904    OBJECTIVE: BP 131/72 mmHg  Pulse 65  Temp(Src) 97.2 F (36.2 C) (Tympanic)  Resp 16   There is no height or weight on file to calculate BMI.    ECOG FS:3 - Symptomatic, >50% confined to bed  General: Well-developed, well-nourished, no acute distress. In wheelchair. Lungs: Clear to auscultation bilaterally. Heart: Regular rate and rhythm. No rubs, murmurs, or gallops. Abdomen: Soft, nontender, nondistended. No organomegaly noted, normoactive bowel sounds. Musculoskeletal: No edema, cyanosis, or clubbing. Neuro: Alert, answering all questions appropriately. Cranial nerves grossly intact. Skin: No rashes or petechiae noted. Psych: Normal affect.   LAB RESULTS:     Component Value Date/Time   NA 136 07/13/2014 1022   K 3.9 11/02/2014 0850   K 4.8 07/13/2014 1022   CL 101 07/13/2014 1022   CO2 26 07/13/2014 1022   GLUCOSE 218* 07/13/2014 1022   BUN 27* 09/04/2014 1113   CREATININE 1.27* 11/02/2014 0850   CREATININE 1.20* 10/03/2014 1030   CALCIUM 8.2* 07/13/2014 1022   PROT 6.1* 11/02/2014 0850   PROT 7.3 06/13/2014 1410   ALBUMIN 2.2* 11/02/2014 0850   ALBUMIN 2.4* 06/13/2014 1410   AST 23 11/02/2014 0850   AST 17 06/13/2014 1410   ALT 10* 11/02/2014 0850   ALT 10* 06/13/2014 1410   ALKPHOS 50 11/02/2014 0850   ALKPHOS 50 06/13/2014 1410   BILITOT 0.3 11/02/2014 0850   GFRNONAA 40* 11/02/2014 0850   GFRNONAA 44* 10/03/2014 1030   GFRAA 46* 11/02/2014 0850   GFRAA 50* 10/03/2014 1030    No results found for: SPEP, UPEP  Lab Results  Component Value Date   WBC 8.6 11/02/2014   NEUTROABS 5.3 11/02/2014   HGB 9.3* 11/02/2014   HCT 29.2* 11/02/2014   MCV 81.6 11/02/2014   PLT 322 11/02/2014      Chemistry      Component  Value Date/Time   NA 136 07/13/2014 1022   K 3.9 11/02/2014 0850   K 4.8 07/13/2014 1022   CL 101 07/13/2014 1022   CO2 26 07/13/2014 1022   BUN 27* 09/04/2014 1113   CREATININE 1.27* 11/02/2014 0850   CREATININE 1.20* 10/03/2014 1030      Component Value Date/Time   CALCIUM 8.2* 07/13/2014 1022   ALKPHOS 50 11/02/2014 0850   ALKPHOS  50 06/13/2014 1410   AST 23 11/02/2014 0850   AST 17 06/13/2014 1410   ALT 10* 11/02/2014 0850   ALT 10* 06/13/2014 1410   BILITOT 0.3 11/02/2014 0850       No results found for: LABCA2  No components found for: [id number]  No results for input(s): INR in the last 168 hours.  No results found for: COLORURINE, APPEARANCEUR, LABSPEC, PHURINE, GLUCOSEU, HGBUR, BILIRUBINUR, KETONESUR, PROTEINUR, UROBILINOGEN, NITRITE, LEUKOCYTESUR  STUDIES: No results found.  ASSESSMENT:  Endometrial cancer.  PLAN:   1. Endometrial ca. As previously discussed with Dr. Inez Pilgrim at previous visit, plan to rescan patient with CT for further evaluation of disease. He had discussed with her restarting chemotherapy if there has been further progression of disease. She had previously had nearly 6 months of stable disease. Has recently been discharged from hospital to SNF after UTI related sepsis.   Will schedule CT scan of abdomen and pelvis in 2-3 weeks, patient wants this time frame because she was just discharged from SNF and needs time to recoup at home.  She specifically requests follow up to be made with Dr. Ma Hillock.  Patient expressed understanding and was in agreement with this plan. She also understands that She can call clinic at any time with any questions, concerns, or complaints.   Dr. Grayland Ormond was available for consultation and review of plan of care for this patient.   Evlyn Kanner, NP   11/06/2014 2:48 PM

## 2014-11-03 DIAGNOSIS — L89322 Pressure ulcer of left buttock, stage 2: Secondary | ICD-10-CM | POA: Diagnosis not present

## 2014-11-03 DIAGNOSIS — L89622 Pressure ulcer of left heel, stage 2: Secondary | ICD-10-CM | POA: Diagnosis not present

## 2014-11-03 DIAGNOSIS — L89312 Pressure ulcer of right buttock, stage 2: Secondary | ICD-10-CM | POA: Diagnosis not present

## 2014-11-03 DIAGNOSIS — I10 Essential (primary) hypertension: Secondary | ICD-10-CM | POA: Diagnosis not present

## 2014-11-03 DIAGNOSIS — F328 Other depressive episodes: Secondary | ICD-10-CM | POA: Diagnosis not present

## 2014-11-03 DIAGNOSIS — I5032 Chronic diastolic (congestive) heart failure: Secondary | ICD-10-CM | POA: Diagnosis not present

## 2014-11-03 DIAGNOSIS — I739 Peripheral vascular disease, unspecified: Secondary | ICD-10-CM | POA: Diagnosis not present

## 2014-11-03 DIAGNOSIS — L989 Disorder of the skin and subcutaneous tissue, unspecified: Secondary | ICD-10-CM | POA: Diagnosis not present

## 2014-11-03 DIAGNOSIS — E119 Type 2 diabetes mellitus without complications: Secondary | ICD-10-CM | POA: Diagnosis not present

## 2014-11-03 DIAGNOSIS — D649 Anemia, unspecified: Secondary | ICD-10-CM | POA: Diagnosis not present

## 2014-11-03 DIAGNOSIS — N393 Stress incontinence (female) (male): Secondary | ICD-10-CM | POA: Diagnosis not present

## 2014-11-03 DIAGNOSIS — I4891 Unspecified atrial fibrillation: Secondary | ICD-10-CM | POA: Diagnosis not present

## 2014-11-03 DIAGNOSIS — M25552 Pain in left hip: Secondary | ICD-10-CM | POA: Diagnosis not present

## 2014-11-05 DIAGNOSIS — I10 Essential (primary) hypertension: Secondary | ICD-10-CM | POA: Diagnosis not present

## 2014-11-05 DIAGNOSIS — L89312 Pressure ulcer of right buttock, stage 2: Secondary | ICD-10-CM | POA: Diagnosis not present

## 2014-11-05 DIAGNOSIS — E119 Type 2 diabetes mellitus without complications: Secondary | ICD-10-CM | POA: Diagnosis not present

## 2014-11-05 DIAGNOSIS — I5032 Chronic diastolic (congestive) heart failure: Secondary | ICD-10-CM | POA: Diagnosis not present

## 2014-11-05 DIAGNOSIS — D649 Anemia, unspecified: Secondary | ICD-10-CM | POA: Diagnosis not present

## 2014-11-05 DIAGNOSIS — L89622 Pressure ulcer of left heel, stage 2: Secondary | ICD-10-CM | POA: Diagnosis not present

## 2014-11-05 DIAGNOSIS — N393 Stress incontinence (female) (male): Secondary | ICD-10-CM | POA: Diagnosis not present

## 2014-11-05 DIAGNOSIS — L89322 Pressure ulcer of left buttock, stage 2: Secondary | ICD-10-CM | POA: Diagnosis not present

## 2014-11-05 DIAGNOSIS — I4891 Unspecified atrial fibrillation: Secondary | ICD-10-CM | POA: Diagnosis not present

## 2014-11-05 DIAGNOSIS — M25552 Pain in left hip: Secondary | ICD-10-CM | POA: Diagnosis not present

## 2014-11-05 DIAGNOSIS — L989 Disorder of the skin and subcutaneous tissue, unspecified: Secondary | ICD-10-CM | POA: Diagnosis not present

## 2014-11-05 DIAGNOSIS — F328 Other depressive episodes: Secondary | ICD-10-CM | POA: Diagnosis not present

## 2014-11-05 DIAGNOSIS — I739 Peripheral vascular disease, unspecified: Secondary | ICD-10-CM | POA: Diagnosis not present

## 2014-11-06 ENCOUNTER — Telehealth: Payer: Self-pay | Admitting: *Deleted

## 2014-11-06 DIAGNOSIS — I739 Peripheral vascular disease, unspecified: Secondary | ICD-10-CM | POA: Diagnosis not present

## 2014-11-06 DIAGNOSIS — I5032 Chronic diastolic (congestive) heart failure: Secondary | ICD-10-CM | POA: Diagnosis not present

## 2014-11-06 DIAGNOSIS — I4891 Unspecified atrial fibrillation: Secondary | ICD-10-CM | POA: Diagnosis not present

## 2014-11-06 DIAGNOSIS — F328 Other depressive episodes: Secondary | ICD-10-CM | POA: Diagnosis not present

## 2014-11-06 DIAGNOSIS — E119 Type 2 diabetes mellitus without complications: Secondary | ICD-10-CM | POA: Diagnosis not present

## 2014-11-06 DIAGNOSIS — L89312 Pressure ulcer of right buttock, stage 2: Secondary | ICD-10-CM | POA: Diagnosis not present

## 2014-11-06 DIAGNOSIS — C541 Malignant neoplasm of endometrium: Secondary | ICD-10-CM

## 2014-11-06 DIAGNOSIS — M25552 Pain in left hip: Secondary | ICD-10-CM | POA: Diagnosis not present

## 2014-11-06 DIAGNOSIS — L989 Disorder of the skin and subcutaneous tissue, unspecified: Secondary | ICD-10-CM | POA: Diagnosis not present

## 2014-11-06 DIAGNOSIS — L89622 Pressure ulcer of left heel, stage 2: Secondary | ICD-10-CM | POA: Diagnosis not present

## 2014-11-06 DIAGNOSIS — N393 Stress incontinence (female) (male): Secondary | ICD-10-CM | POA: Diagnosis not present

## 2014-11-06 DIAGNOSIS — L89322 Pressure ulcer of left buttock, stage 2: Secondary | ICD-10-CM | POA: Diagnosis not present

## 2014-11-06 DIAGNOSIS — D649 Anemia, unspecified: Secondary | ICD-10-CM | POA: Diagnosis not present

## 2014-11-06 DIAGNOSIS — I10 Essential (primary) hypertension: Secondary | ICD-10-CM | POA: Diagnosis not present

## 2014-11-06 NOTE — Telephone Encounter (Signed)
Pt advised of order and wants to come in on morning before PET. Appt for 11/21/14 @ 900 given and agreed on

## 2014-11-06 NOTE — Telephone Encounter (Signed)
States CA 125 was drawn on 5/27, there are no results in computer and there was no order seen in [facility] or [place]. Asking for me to check with Dr Ma Hillock to see what he wants to do since her next appt is with him

## 2014-11-08 DIAGNOSIS — L989 Disorder of the skin and subcutaneous tissue, unspecified: Secondary | ICD-10-CM | POA: Diagnosis not present

## 2014-11-08 DIAGNOSIS — L89312 Pressure ulcer of right buttock, stage 2: Secondary | ICD-10-CM | POA: Diagnosis not present

## 2014-11-08 DIAGNOSIS — N393 Stress incontinence (female) (male): Secondary | ICD-10-CM | POA: Diagnosis not present

## 2014-11-08 DIAGNOSIS — I10 Essential (primary) hypertension: Secondary | ICD-10-CM | POA: Diagnosis not present

## 2014-11-08 DIAGNOSIS — I739 Peripheral vascular disease, unspecified: Secondary | ICD-10-CM | POA: Diagnosis not present

## 2014-11-08 DIAGNOSIS — L89622 Pressure ulcer of left heel, stage 2: Secondary | ICD-10-CM | POA: Diagnosis not present

## 2014-11-08 DIAGNOSIS — F328 Other depressive episodes: Secondary | ICD-10-CM | POA: Diagnosis not present

## 2014-11-08 DIAGNOSIS — L89322 Pressure ulcer of left buttock, stage 2: Secondary | ICD-10-CM | POA: Diagnosis not present

## 2014-11-08 DIAGNOSIS — D649 Anemia, unspecified: Secondary | ICD-10-CM | POA: Diagnosis not present

## 2014-11-08 DIAGNOSIS — E119 Type 2 diabetes mellitus without complications: Secondary | ICD-10-CM | POA: Diagnosis not present

## 2014-11-08 DIAGNOSIS — I4891 Unspecified atrial fibrillation: Secondary | ICD-10-CM | POA: Diagnosis not present

## 2014-11-08 DIAGNOSIS — M25552 Pain in left hip: Secondary | ICD-10-CM | POA: Diagnosis not present

## 2014-11-08 DIAGNOSIS — I5032 Chronic diastolic (congestive) heart failure: Secondary | ICD-10-CM | POA: Diagnosis not present

## 2014-11-10 ENCOUNTER — Other Ambulatory Visit: Payer: Self-pay | Admitting: Family Medicine

## 2014-11-12 ENCOUNTER — Other Ambulatory Visit: Payer: Self-pay | Admitting: Family Medicine

## 2014-11-13 DIAGNOSIS — L989 Disorder of the skin and subcutaneous tissue, unspecified: Secondary | ICD-10-CM | POA: Diagnosis not present

## 2014-11-13 DIAGNOSIS — F328 Other depressive episodes: Secondary | ICD-10-CM | POA: Diagnosis not present

## 2014-11-13 DIAGNOSIS — M25552 Pain in left hip: Secondary | ICD-10-CM | POA: Diagnosis not present

## 2014-11-13 DIAGNOSIS — L89622 Pressure ulcer of left heel, stage 2: Secondary | ICD-10-CM | POA: Diagnosis not present

## 2014-11-13 DIAGNOSIS — D649 Anemia, unspecified: Secondary | ICD-10-CM | POA: Diagnosis not present

## 2014-11-13 DIAGNOSIS — I4891 Unspecified atrial fibrillation: Secondary | ICD-10-CM | POA: Diagnosis not present

## 2014-11-13 DIAGNOSIS — L89312 Pressure ulcer of right buttock, stage 2: Secondary | ICD-10-CM | POA: Diagnosis not present

## 2014-11-13 DIAGNOSIS — N393 Stress incontinence (female) (male): Secondary | ICD-10-CM | POA: Diagnosis not present

## 2014-11-13 DIAGNOSIS — I739 Peripheral vascular disease, unspecified: Secondary | ICD-10-CM | POA: Diagnosis not present

## 2014-11-13 DIAGNOSIS — L89322 Pressure ulcer of left buttock, stage 2: Secondary | ICD-10-CM | POA: Diagnosis not present

## 2014-11-13 DIAGNOSIS — I5032 Chronic diastolic (congestive) heart failure: Secondary | ICD-10-CM | POA: Diagnosis not present

## 2014-11-13 DIAGNOSIS — I10 Essential (primary) hypertension: Secondary | ICD-10-CM | POA: Diagnosis not present

## 2014-11-13 DIAGNOSIS — E119 Type 2 diabetes mellitus without complications: Secondary | ICD-10-CM | POA: Diagnosis not present

## 2014-11-15 DIAGNOSIS — N393 Stress incontinence (female) (male): Secondary | ICD-10-CM | POA: Diagnosis not present

## 2014-11-15 DIAGNOSIS — L89322 Pressure ulcer of left buttock, stage 2: Secondary | ICD-10-CM | POA: Diagnosis not present

## 2014-11-15 DIAGNOSIS — I5032 Chronic diastolic (congestive) heart failure: Secondary | ICD-10-CM | POA: Diagnosis not present

## 2014-11-15 DIAGNOSIS — M25552 Pain in left hip: Secondary | ICD-10-CM | POA: Diagnosis not present

## 2014-11-15 DIAGNOSIS — E119 Type 2 diabetes mellitus without complications: Secondary | ICD-10-CM | POA: Diagnosis not present

## 2014-11-15 DIAGNOSIS — L989 Disorder of the skin and subcutaneous tissue, unspecified: Secondary | ICD-10-CM | POA: Diagnosis not present

## 2014-11-15 DIAGNOSIS — I4891 Unspecified atrial fibrillation: Secondary | ICD-10-CM | POA: Diagnosis not present

## 2014-11-15 DIAGNOSIS — I10 Essential (primary) hypertension: Secondary | ICD-10-CM | POA: Diagnosis not present

## 2014-11-15 DIAGNOSIS — L89312 Pressure ulcer of right buttock, stage 2: Secondary | ICD-10-CM | POA: Diagnosis not present

## 2014-11-15 DIAGNOSIS — I739 Peripheral vascular disease, unspecified: Secondary | ICD-10-CM | POA: Diagnosis not present

## 2014-11-15 DIAGNOSIS — F328 Other depressive episodes: Secondary | ICD-10-CM | POA: Diagnosis not present

## 2014-11-15 DIAGNOSIS — D649 Anemia, unspecified: Secondary | ICD-10-CM | POA: Diagnosis not present

## 2014-11-15 DIAGNOSIS — L89622 Pressure ulcer of left heel, stage 2: Secondary | ICD-10-CM | POA: Diagnosis not present

## 2014-11-17 ENCOUNTER — Emergency Department: Payer: Medicare Other

## 2014-11-17 ENCOUNTER — Encounter: Payer: Self-pay | Admitting: Emergency Medicine

## 2014-11-17 ENCOUNTER — Other Ambulatory Visit: Payer: Self-pay

## 2014-11-17 ENCOUNTER — Emergency Department
Admission: EM | Admit: 2014-11-17 | Discharge: 2014-11-18 | Disposition: A | Discharge status: Home / Self Care | Payer: Medicare Other | Attending: Student | Admitting: Student

## 2014-11-17 DIAGNOSIS — N390 Urinary tract infection, site not specified: Secondary | ICD-10-CM

## 2014-11-17 DIAGNOSIS — R4182 Altered mental status, unspecified: Secondary | ICD-10-CM | POA: Diagnosis not present

## 2014-11-17 DIAGNOSIS — R410 Disorientation, unspecified: Secondary | ICD-10-CM | POA: Insufficient documentation

## 2014-11-17 DIAGNOSIS — Z7901 Long term (current) use of anticoagulants: Secondary | ICD-10-CM | POA: Diagnosis not present

## 2014-11-17 DIAGNOSIS — Z79899 Other long term (current) drug therapy: Secondary | ICD-10-CM | POA: Insufficient documentation

## 2014-11-17 DIAGNOSIS — R911 Solitary pulmonary nodule: Secondary | ICD-10-CM | POA: Diagnosis not present

## 2014-11-17 DIAGNOSIS — E119 Type 2 diabetes mellitus without complications: Secondary | ICD-10-CM | POA: Diagnosis not present

## 2014-11-17 DIAGNOSIS — R7309 Other abnormal glucose: Secondary | ICD-10-CM | POA: Diagnosis not present

## 2014-11-17 DIAGNOSIS — Z794 Long term (current) use of insulin: Secondary | ICD-10-CM | POA: Insufficient documentation

## 2014-11-17 DIAGNOSIS — R4689 Other symptoms and signs involving appearance and behavior: Secondary | ICD-10-CM | POA: Diagnosis not present

## 2014-11-17 HISTORY — DX: Type 2 diabetes mellitus without complications: E11.9

## 2014-11-17 LAB — CBC WITH DIFFERENTIAL/PLATELET
BASOS PCT: 1 %
Basophils Absolute: 0.1 10*3/uL (ref 0–0.1)
EOS PCT: 1 %
Eosinophils Absolute: 0.1 10*3/uL (ref 0–0.7)
HEMATOCRIT: 30.3 % — AB (ref 35.0–47.0)
Hemoglobin: 9.7 g/dL — ABNORMAL LOW (ref 12.0–16.0)
Lymphocytes Relative: 34 %
Lymphs Abs: 3.1 10*3/uL (ref 1.0–3.6)
MCH: 26.4 pg (ref 26.0–34.0)
MCHC: 32.1 g/dL (ref 32.0–36.0)
MCV: 82.2 fL (ref 80.0–100.0)
MONOS PCT: 10 %
Monocytes Absolute: 0.9 10*3/uL (ref 0.2–0.9)
NEUTROS ABS: 5.1 10*3/uL (ref 1.4–6.5)
NEUTROS PCT: 54 %
PLATELETS: 365 10*3/uL (ref 150–440)
RBC: 3.69 MIL/uL — ABNORMAL LOW (ref 3.80–5.20)
RDW: 20.6 % — ABNORMAL HIGH (ref 11.5–14.5)
WBC: 9.4 10*3/uL (ref 3.6–11.0)

## 2014-11-17 LAB — COMPREHENSIVE METABOLIC PANEL
ALK PHOS: 55 U/L (ref 38–126)
ALT: 8 U/L — ABNORMAL LOW (ref 14–54)
AST: 20 U/L (ref 15–41)
Albumin: 2.4 g/dL — ABNORMAL LOW (ref 3.5–5.0)
Anion gap: 6 (ref 5–15)
BUN: 25 mg/dL — ABNORMAL HIGH (ref 6–20)
CHLORIDE: 105 mmol/L (ref 101–111)
CO2: 22 mmol/L (ref 22–32)
Calcium: 8.1 mg/dL — ABNORMAL LOW (ref 8.9–10.3)
Creatinine, Ser: 1.52 mg/dL — ABNORMAL HIGH (ref 0.44–1.00)
GFR calc Af Amer: 37 mL/min — ABNORMAL LOW (ref >60)
GFR calc non Af Amer: 32 mL/min — ABNORMAL LOW (ref >60)
Glucose, Bld: 301 mg/dL — ABNORMAL HIGH (ref 65–99)
Potassium: 4.7 mmol/L (ref 3.5–5.1)
SODIUM: 133 mmol/L — AB (ref 135–145)
Total Bilirubin: 0.3 mg/dL (ref 0.3–1.2)
Total Protein: 6.2 g/dL — ABNORMAL LOW (ref 6.5–8.1)

## 2014-11-17 LAB — PROTIME-INR
INR: 1.52
Prothrombin Time: 18.5 seconds — ABNORMAL HIGH (ref 11.4–15.0)

## 2014-11-17 LAB — APTT: aPTT: 31 seconds (ref 24–36)

## 2014-11-17 MED ORDER — SODIUM CHLORIDE 0.9 % IV BOLUS (SEPSIS)
500.0000 mL | Freq: Once | INTRAVENOUS | Status: AC
  Administered 2014-11-18: 500 mL via INTRAVENOUS

## 2014-11-17 NOTE — ED Provider Notes (Addendum)
[facility] Emergency Department Provider Note  ____________________________________________  Time seen: Approximately 11:20 PM  I have reviewed the triage vital signs and the nursing notes.   HISTORY  Chief Complaint Altered Mental Status    HPI Shelley Fields is a 79 y.o. female with history of diabetes, metastatic uterine cancer not currently undergoing treatment, hypertension, hyperlipidemia who presents for evaluation of transient altered mental status, now resolved. According to her friends and family at bedside, the patient was "talking out of her head" tonight at around 9:30p. She was talking to her husband who is no longer living, she "was trying to eat the phone". This has since resolved. No weakness or speech difficulty. The patient reports that she currently feels well. This began gradually this evening and has resolved. Current severity 0 out of 10. No modifying factors. The patient denies any recent illness including no cough, sneezing, runny nose, congestion, nausea, vomiting, diarrhea, fevers, dysuria. She has known decubitus ulcers to the left heel and the sacrum which are followed by wound care with twice weekly dressing changes. She reports her blood glucose was very high this evening at 471 despite compliance with her home insulin.   Past Medical History  Diagnosis Date  . Diabetes mellitus without complication     Patient Active Problem List   Diagnosis Date Noted  . Encounter for other specified administrative purpose 07/10/2013  . Candidal vulvovaginitis 10/14/2012  . Candida vaginitis 10/14/2012  . Polypharmacy 06/23/2012  . Breast lump 06/10/2012  . Laboratory examination 04/12/2012  . Bladder infection, chronic 03/23/2012  . Total urinary incontinence 03/23/2012  . 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 Female genuine stress incontinence 03/23/2012  . Disorder of bladder function 03/23/2012  . Incomplete bladder emptying 03/23/2012  .  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 Cystitis, radiation 03/23/2012  . Cancer of body of uterus 03/23/2012  . Cancer of corpus uteri, except isthmus 03/23/2012  . Bladder neoplasm of uncertain malignant potential 03/23/2012  . Infection of urinary tract 03/23/2012  . Colon polyp 01/06/2012  . Difficulty in walking 01/06/2012  . Diabetes 01/06/2012  . Deep vein thrombosis 01/06/2012  . Cancer of endometrium 01/06/2012  . Contact with and exposure to tuberculosis 01/06/2012  . BP (high blood pressure) 01/06/2012  . Angiopathy, peripheral 01/06/2012  . Abnormal presence of protein in urine 01/06/2012  . Retinopathy 01/06/2012    No past surgical history on file.  Current Outpatient Rx  Name  Route  Sig  Dispense  Refill  . amLODipine (NORVASC) 10 MG tablet   Oral   Take 10 mg by mouth daily.         Marland Kitchen amLODipine (NORVASC) 5 MG tablet      TAKE 1 TABLET BY MOUTH EVERY DAY   90 tablet   0     **Patient requests 90 days supply**   . atenolol (TENORMIN) 50 MG tablet   Oral   Take 50 mg by mouth daily.         . benazepril (LOTENSIN) 40 MG tablet   Oral   Take 40 mg by mouth daily.         . cloNIDine (CATAPRES) 0.2 MG tablet   Oral   Take 0.2 mg by mouth 2 (two) times daily.         . furosemide (LASIX) 20 MG tablet   Oral   Take 20 mg by mouth every other day.         Marland Kitchen HUMULIN 70/30 (  70-30) 100 UNIT/ML injection      16 Units daily at 6 PM.           Dispense as written.   . hydrALAZINE (APRESOLINE) 100 MG tablet   Oral   Take 100 mg by mouth 2 (two) times daily.         Marland Kitchen lovastatin (MEVACOR) 20 MG tablet   Oral   Take 20 mg by mouth daily.         . meclizine (ANTIVERT) 25 MG tablet   Oral   Take 25 mg by mouth daily.         . mirtazapine (REMERON) 15 MG tablet   Oral   Take 15 mg by mouth daily.         Marland Kitchen NOVOLOG FLEXPEN 100 UNIT/ML FlexPen      Per Sliding Scale...           Dispense as written.   . nystatin  (MYCOSTATIN) powder               . omeprazole (PRILOSEC) 20 MG capsule   Oral   Take 20 mg by mouth daily.      12   . ONE TOUCH ULTRA TEST test strip                 Dispense as written.   . potassium chloride (K-DUR) 10 MEQ tablet   Oral   Take 10 mEq by mouth every other day.         . promethazine (PHENERGAN) 12.5 MG tablet   Oral   Take 12.5 mg by mouth every 6 (six) hours as needed.         Marland Kitchen SANTYL ointment                 Dispense as written.   . tamsulosin (FLOMAX) 0.4 MG CAPS capsule   Oral   Take 0.4 mg by mouth 2 (two) times daily.         Marland Kitchen warfarin (COUMADIN) 1 MG tablet   Oral   Take 0.5 mg by mouth daily.           Allergies Contrast media  No family history on file.  Social History History  Substance Use Topics  . Smoking status: Never Smoker   . Smokeless tobacco: Not on file  . Alcohol Use: Not on file    Review of Systems Constitutional: No fever/chills Eyes: No visual changes. ENT: No sore throat. Cardiovascular: Denies chest pain. Respiratory: Denies shortness of breath. Gastrointestinal: No abdominal pain.  No nausea, no vomiting.  No diarrhea.  No constipation. Genitourinary: Negative for dysuria. Musculoskeletal: Negative for back pain. Skin: Negative for rash. Neurological: Negative for headaches, focal weakness or numbness.  10-point ROS otherwise negative.  ____________________________________________   PHYSICAL EXAM:  VITAL SIGNS: ED Triage Vitals  Enc Vitals Group     BP 11/17/14 2209 207/61 mmHg     Pulse Rate 11/17/14 2209 77     Resp 11/17/14 2209 18     Temp 11/17/14 2209 98 F (36.7 C)     Temp Source 11/17/14 2209 Oral     SpO2 11/17/14 2209 100 %     Weight --      Height --      Head Cir --      [facility] --      Pain Score 11/17/14 2210 0     Pain Loc --      Pain  Edu? --      Excl. in [place]? --     Constitutional: Alert and oriented x 4. Nontoxic-appearing and in no acute  distress. Eyes: Conjunctivae are normal. PERRL. EOMI. Head: Atraumatic. Nose: No congestion/rhinnorhea. Mouth/Throat: Mucous membranes are moist.  Oropharynx non-erythematous. Neck: No stridor.  Cardiovascular: Normal rate, regular rhythm. Grossly normal heart sounds.  Good peripheral circulation. Respiratory: Normal respiratory effort.  No retractions. Lungs CTAB. Gastrointestinal: Soft and nontender. No distention. No abdominal bruits. No CVA tenderness. Genitourinary: deferred Musculoskeletal: No lower extremity tenderness nor edema.  No joint effusions. Neurologic:  Normal speech and language. No gross focal neurologic deficits are appreciated. Speech is normal.  5 out of 5 strength in bilateral upper and lower extremities, sensation intact to light touch throughout, normal finger-nose-finger, cranial nerves II through XII intact. Skin:  Large healing sacral decubitus ulcer, healing decubitus ulcer to the left heel without any evidence of superinfection. Psychiatric: Mood and affect are normal. Speech and behavior are normal.  ____________________________________________   LABS (all labs ordered are listed, but only abnormal results are displayed)  Labs Reviewed  CBC WITH DIFFERENTIAL/PLATELET - Abnormal; Notable for the following:    RBC 3.69 (*)    Hemoglobin 9.7 (*)    HCT 30.3 (*)    RDW 20.6 (*)    All other components within normal limits  COMPREHENSIVE METABOLIC PANEL - Abnormal; Notable for the following:    Sodium 133 (*)    Glucose, Bld 301 (*)    BUN 25 (*)    Creatinine, Ser 1.52 (*)    Calcium 8.1 (*)    Total Protein 6.2 (*)    Albumin 2.4 (*)    ALT 8 (*)    GFR calc non Af Amer 32 (*)    GFR calc Af Amer 37 (*)    All other components within normal limits  URINALYSIS COMPLETEWITH MICROSCOPIC ([facility] ONLY) - Abnormal; Notable for the following:    Color, Urine YELLOW (*)    APPearance TURBID (*)    Glucose, UA >500 (*)    Ketones, ur TRACE (*)    Hgb  urine dipstick 1+ (*)    Protein, ur >500 (*)    Leukocytes, UA 3+ (*)    Bacteria, UA RARE (*)    All other components within normal limits  PROTIME-INR - Abnormal; Notable for the following:    Prothrombin Time 18.5 (*)    All other components within normal limits  URINE CULTURE  TROPONIN I  APTT   ____________________________________________  EKG  ED ECG REPORT I, Joanne Gavel, the attending physician, personally viewed and interpreted this ECG.   Date: 11/18/2014  EKG Time: 22:04  Rate: 76  Rhythm: sinus rhythm with occasional PVCs  Axis: normal  Intervals:none  ST&T Change: No acute ST segment elevation.  ____________________________________________  RADIOLOGY  CXR IMPRESSION: Unchanged 14 mm pulmonary nodule on the right.  No acute cardiopulmonary findings.    CT head IMPRESSION: No acute intracranial process  Involutional changes. Bilateral basal ganglia perivascular spaces and/or lacunar infarcts.  Mild to moderate white matter changes most consistent chronic small vessel ischemic disease. ____________________________________________   PROCEDURES  Procedure(s) performed: None  Critical Care performed: No  ____________________________________________   INITIAL IMPRESSION / ASSESSMENT AND PLAN / ED COURSE  Pertinent labs & imaging results that were available during my care of the patient were reviewed by me and considered in my medical decision making (see chart for details).   ----------------------------------------- 2:40 AM  on 11/18/2014 ----------------------------------------- Anson Oregon is a 79 y.o. female with history of diabetes, metastatic uterine cancer not currently undergoing treatment, hypertension, hyperlipidemia who presents for evaluation of transient altered mental status, now resolved. On exam, she is generally well-appearing and in no acute distress. Vital signs stable, she is afebrile. She has an intact neurological  exam. She has healing decubitus ulcers of the sacrum and the left heel which are tended to by wound nurse twice weekly. She is alert and oriented 4. Labs reviewed and are at baseline including mild anemia and mild creatinine elevation. Troponin negative. Urinalysis is concerning for urinary tract infection which could have caused her transient confusion. We'll treat with Keflex. CT head with no acute intracranial process. I discussed findings with Dr. Dorann Lodge, the reading radiologist who reports no acute change when compared to 2012 so I doubt acute CVA as the cause of the patient's transient confusion today. She remains alert and oriented 4. Return precautions discussed. DC home. She is comfortable with discharge plan. ____________________________________________   FINAL CLINICAL IMPRESSION(S) / ED DIAGNOSES  Final diagnoses:  Confusion  Altered mental status, unspecified altered mental status type  UTI (lower urinary tract infection)      Joanne Gavel, MD 11/18/14 2957  Joanne Gavel, MD 11/18/14 [phone]

## 2014-11-17 NOTE — ED Notes (Signed)
Pt reports "not feeling well." Pt has no facial droop, strong grips bilateral, no aphasia. Pt reports her sister thought she was acting strange. Pt is alert and oriented to time place and person.

## 2014-11-18 DIAGNOSIS — R410 Disorientation, unspecified: Secondary | ICD-10-CM | POA: Diagnosis not present

## 2014-11-18 LAB — URINALYSIS COMPLETE WITH MICROSCOPIC (ARMC ONLY)
BILIRUBIN URINE: NEGATIVE
Glucose, UA: >500 mg/dL — AB
NITRITE: NEGATIVE
Protein, ur: >500 mg/dL — AB
SQUAMOUS EPITHELIAL / LPF: NONE SEEN
Specific Gravity, Urine: 1.013 (ref 1.005–1.030)
pH: 5 (ref 5.0–8.0)

## 2014-11-18 LAB — TROPONIN I: Troponin I: <0.03 ng/mL (ref <0.031)

## 2014-11-18 MED ORDER — CEPHALEXIN 500 MG PO CAPS: 500.0000 mg | ORAL_CAPSULE | Freq: Four times a day (QID) | ORAL | Status: DC

## 2014-11-18 MED ORDER — CEPHALEXIN 500 MG PO CAPS
ORAL_CAPSULE | ORAL | Status: AC
Start: 2014-11-18 — End: 2014-11-18
  Administered 2014-11-18: 500 mg via ORAL
  Filled 2014-11-18: qty 1

## 2014-11-18 MED ORDER — CEPHALEXIN 500 MG PO CAPS
500.0000 mg | ORAL_CAPSULE | Freq: Once | ORAL | Status: AC
  Administered 2014-11-18: 500 mg via ORAL

## 2014-11-20 DIAGNOSIS — L89322 Pressure ulcer of left buttock, stage 2: Secondary | ICD-10-CM | POA: Diagnosis not present

## 2014-11-20 DIAGNOSIS — E119 Type 2 diabetes mellitus without complications: Secondary | ICD-10-CM | POA: Diagnosis not present

## 2014-11-20 DIAGNOSIS — L89622 Pressure ulcer of left heel, stage 2: Secondary | ICD-10-CM | POA: Diagnosis not present

## 2014-11-20 DIAGNOSIS — D649 Anemia, unspecified: Secondary | ICD-10-CM | POA: Diagnosis not present

## 2014-11-20 DIAGNOSIS — M25552 Pain in left hip: Secondary | ICD-10-CM | POA: Diagnosis not present

## 2014-11-20 DIAGNOSIS — I5032 Chronic diastolic (congestive) heart failure: Secondary | ICD-10-CM | POA: Diagnosis not present

## 2014-11-20 DIAGNOSIS — L89312 Pressure ulcer of right buttock, stage 2: Secondary | ICD-10-CM | POA: Diagnosis not present

## 2014-11-20 DIAGNOSIS — L989 Disorder of the skin and subcutaneous tissue, unspecified: Secondary | ICD-10-CM | POA: Diagnosis not present

## 2014-11-20 DIAGNOSIS — F328 Other depressive episodes: Secondary | ICD-10-CM | POA: Diagnosis not present

## 2014-11-20 DIAGNOSIS — I739 Peripheral vascular disease, unspecified: Secondary | ICD-10-CM | POA: Diagnosis not present

## 2014-11-20 DIAGNOSIS — N393 Stress incontinence (female) (male): Secondary | ICD-10-CM | POA: Diagnosis not present

## 2014-11-20 DIAGNOSIS — I10 Essential (primary) hypertension: Secondary | ICD-10-CM | POA: Diagnosis not present

## 2014-11-20 DIAGNOSIS — I4891 Unspecified atrial fibrillation: Secondary | ICD-10-CM | POA: Diagnosis not present

## 2014-11-20 LAB — URINE CULTURE
Culture: 50000
Special Requests: NORMAL

## 2014-11-21 ENCOUNTER — Ambulatory Visit
Admission: RE | Admit: 2014-11-21 | Discharge: 2014-11-21 | Disposition: A | Discharge status: Home / Self Care | Payer: Medicare Other | Source: Ambulatory Visit | Attending: Family Medicine | Admitting: Family Medicine

## 2014-11-21 ENCOUNTER — Inpatient Hospital Stay: Payer: Medicare Other | Attending: Family Medicine

## 2014-11-21 DIAGNOSIS — R5383 Other fatigue: Secondary | ICD-10-CM | POA: Insufficient documentation

## 2014-11-21 DIAGNOSIS — Z79899 Other long term (current) drug therapy: Secondary | ICD-10-CM | POA: Insufficient documentation

## 2014-11-21 DIAGNOSIS — Z9221 Personal history of antineoplastic chemotherapy: Secondary | ICD-10-CM | POA: Diagnosis not present

## 2014-11-21 DIAGNOSIS — E119 Type 2 diabetes mellitus without complications: Secondary | ICD-10-CM | POA: Insufficient documentation

## 2014-11-21 DIAGNOSIS — K8020 Calculus of gallbladder without cholecystitis without obstruction: Secondary | ICD-10-CM | POA: Insufficient documentation

## 2014-11-21 DIAGNOSIS — L89312 Pressure ulcer of right buttock, stage 2: Secondary | ICD-10-CM | POA: Diagnosis not present

## 2014-11-21 DIAGNOSIS — C779 Secondary and unspecified malignant neoplasm of lymph node, unspecified: Secondary | ICD-10-CM | POA: Diagnosis not present

## 2014-11-21 DIAGNOSIS — R21 Rash and other nonspecific skin eruption: Secondary | ICD-10-CM | POA: Insufficient documentation

## 2014-11-21 DIAGNOSIS — Z8541 Personal history of malignant neoplasm of cervix uteri: Secondary | ICD-10-CM | POA: Diagnosis not present

## 2014-11-21 DIAGNOSIS — Z794 Long term (current) use of insulin: Secondary | ICD-10-CM | POA: Insufficient documentation

## 2014-11-21 DIAGNOSIS — D801 Nonfamilial hypogammaglobulinemia: Secondary | ICD-10-CM | POA: Diagnosis not present

## 2014-11-21 DIAGNOSIS — Z8614 Personal history of Methicillin resistant Staphylococcus aureus infection: Secondary | ICD-10-CM | POA: Diagnosis not present

## 2014-11-21 DIAGNOSIS — I7090 Unspecified atherosclerosis: Secondary | ICD-10-CM | POA: Insufficient documentation

## 2014-11-21 DIAGNOSIS — N261 Atrophy of kidney (terminal): Secondary | ICD-10-CM | POA: Insufficient documentation

## 2014-11-21 DIAGNOSIS — C541 Malignant neoplasm of endometrium: Secondary | ICD-10-CM | POA: Insufficient documentation

## 2014-11-21 DIAGNOSIS — C786 Secondary malignant neoplasm of retroperitoneum and peritoneum: Secondary | ICD-10-CM | POA: Insufficient documentation

## 2014-11-21 DIAGNOSIS — Z86711 Personal history of pulmonary embolism: Secondary | ICD-10-CM | POA: Insufficient documentation

## 2014-11-21 DIAGNOSIS — Z08 Encounter for follow-up examination after completed treatment for malignant neoplasm: Secondary | ICD-10-CM | POA: Insufficient documentation

## 2014-11-21 DIAGNOSIS — L89622 Pressure ulcer of left heel, stage 2: Secondary | ICD-10-CM | POA: Diagnosis not present

## 2014-11-21 DIAGNOSIS — E780 Pure hypercholesterolemia: Secondary | ICD-10-CM | POA: Diagnosis not present

## 2014-11-21 DIAGNOSIS — Z7901 Long term (current) use of anticoagulants: Secondary | ICD-10-CM | POA: Insufficient documentation

## 2014-11-21 DIAGNOSIS — R32 Unspecified urinary incontinence: Secondary | ICD-10-CM | POA: Diagnosis not present

## 2014-11-21 DIAGNOSIS — I10 Essential (primary) hypertension: Secondary | ICD-10-CM | POA: Insufficient documentation

## 2014-11-21 DIAGNOSIS — K760 Fatty (change of) liver, not elsewhere classified: Secondary | ICD-10-CM | POA: Insufficient documentation

## 2014-11-22 DIAGNOSIS — I4891 Unspecified atrial fibrillation: Secondary | ICD-10-CM | POA: Diagnosis not present

## 2014-11-22 DIAGNOSIS — I739 Peripheral vascular disease, unspecified: Secondary | ICD-10-CM | POA: Diagnosis not present

## 2014-11-22 DIAGNOSIS — I10 Essential (primary) hypertension: Secondary | ICD-10-CM | POA: Diagnosis not present

## 2014-11-22 DIAGNOSIS — I5032 Chronic diastolic (congestive) heart failure: Secondary | ICD-10-CM | POA: Diagnosis not present

## 2014-11-22 DIAGNOSIS — L89622 Pressure ulcer of left heel, stage 2: Secondary | ICD-10-CM | POA: Diagnosis not present

## 2014-11-22 DIAGNOSIS — L89312 Pressure ulcer of right buttock, stage 2: Secondary | ICD-10-CM | POA: Diagnosis not present

## 2014-11-22 DIAGNOSIS — E119 Type 2 diabetes mellitus without complications: Secondary | ICD-10-CM | POA: Diagnosis not present

## 2014-11-22 DIAGNOSIS — F328 Other depressive episodes: Secondary | ICD-10-CM | POA: Diagnosis not present

## 2014-11-22 DIAGNOSIS — N393 Stress incontinence (female) (male): Secondary | ICD-10-CM | POA: Diagnosis not present

## 2014-11-22 DIAGNOSIS — M25552 Pain in left hip: Secondary | ICD-10-CM | POA: Diagnosis not present

## 2014-11-22 DIAGNOSIS — D649 Anemia, unspecified: Secondary | ICD-10-CM | POA: Diagnosis not present

## 2014-11-22 DIAGNOSIS — L989 Disorder of the skin and subcutaneous tissue, unspecified: Secondary | ICD-10-CM | POA: Diagnosis not present

## 2014-11-22 DIAGNOSIS — L89322 Pressure ulcer of left buttock, stage 2: Secondary | ICD-10-CM | POA: Diagnosis not present

## 2014-11-22 LAB — CA 125: CA 125: 4186 U/mL — ABNORMAL HIGH (ref 0.0–38.1)

## 2014-11-23 ENCOUNTER — Ambulatory Visit: Payer: Medicare Other

## 2014-11-23 ENCOUNTER — Other Ambulatory Visit: Payer: Self-pay | Admitting: Family Medicine

## 2014-11-23 ENCOUNTER — Telehealth: Payer: Self-pay | Admitting: *Deleted

## 2014-11-23 ENCOUNTER — Inpatient Hospital Stay (HOSPITAL_BASED_OUTPATIENT_CLINIC_OR_DEPARTMENT_OTHER): Payer: Medicare Other | Admitting: Hematology and Oncology

## 2014-11-23 VITALS — BP 144/98 | HR 99 | Temp 98.5°F | Resp 16 | Wt 151.9 lb

## 2014-11-23 DIAGNOSIS — I10 Essential (primary) hypertension: Secondary | ICD-10-CM | POA: Diagnosis not present

## 2014-11-23 DIAGNOSIS — D801 Nonfamilial hypogammaglobulinemia: Secondary | ICD-10-CM | POA: Diagnosis not present

## 2014-11-23 DIAGNOSIS — Z9221 Personal history of antineoplastic chemotherapy: Secondary | ICD-10-CM | POA: Diagnosis not present

## 2014-11-23 DIAGNOSIS — C779 Secondary and unspecified malignant neoplasm of lymph node, unspecified: Secondary | ICD-10-CM

## 2014-11-23 DIAGNOSIS — E119 Type 2 diabetes mellitus without complications: Secondary | ICD-10-CM | POA: Diagnosis not present

## 2014-11-23 DIAGNOSIS — Z8541 Personal history of malignant neoplasm of cervix uteri: Secondary | ICD-10-CM | POA: Diagnosis not present

## 2014-11-23 DIAGNOSIS — Z86711 Personal history of pulmonary embolism: Secondary | ICD-10-CM

## 2014-11-23 DIAGNOSIS — E780 Pure hypercholesterolemia: Secondary | ICD-10-CM | POA: Diagnosis not present

## 2014-11-23 DIAGNOSIS — R21 Rash and other nonspecific skin eruption: Secondary | ICD-10-CM | POA: Diagnosis not present

## 2014-11-23 DIAGNOSIS — Z794 Long term (current) use of insulin: Secondary | ICD-10-CM | POA: Diagnosis not present

## 2014-11-23 DIAGNOSIS — Z7901 Long term (current) use of anticoagulants: Secondary | ICD-10-CM

## 2014-11-23 DIAGNOSIS — Z8614 Personal history of Methicillin resistant Staphylococcus aureus infection: Secondary | ICD-10-CM

## 2014-11-23 DIAGNOSIS — C541 Malignant neoplasm of endometrium: Secondary | ICD-10-CM

## 2014-11-23 DIAGNOSIS — R5383 Other fatigue: Secondary | ICD-10-CM

## 2014-11-23 DIAGNOSIS — Z79899 Other long term (current) drug therapy: Secondary | ICD-10-CM | POA: Diagnosis not present

## 2014-11-23 DIAGNOSIS — I82409 Acute embolism and thrombosis of unspecified deep veins of unspecified lower extremity: Secondary | ICD-10-CM

## 2014-11-23 DIAGNOSIS — R630 Anorexia: Secondary | ICD-10-CM

## 2014-11-23 DIAGNOSIS — R32 Unspecified urinary incontinence: Secondary | ICD-10-CM

## 2014-11-23 LAB — PROTIME-INR
INR: 1.7
Prothrombin Time: 20.2 seconds — ABNORMAL HIGH (ref 11.4–15.0)

## 2014-11-23 MED ORDER — MEGESTROL ACETATE 625 MG/5ML PO SUSP: 200.0000 mg | Freq: Every day | ORAL | Status: DC

## 2014-11-23 NOTE — Progress Notes (Signed)
   11/23/14 1100  Clinical Encounter Type  Visited With Patient and family together  Visit Type Initial  Referral From Family  Consult/Referral To Chaplain  Advance Directives (For Healthcare)  Does patient have an advance directive? No;Yes  Would patient like information on creating an advanced directive? Yes - Educational materials given  Does patient want to make changes to advanced directive? Yes - information given  Provided assistance to patient and family about Advance Directive, obtained notary.  [place] [phone]

## 2014-11-23 NOTE — Telephone Encounter (Signed)
  I called in.  Make sure it went to right pharmacy.  I sent a note to Juliann Pulse that the concentration was different on the escribe then normal for the megace and thus the patient takes less than a teaspoon at a time. I asked her to call patient to verify her understanding.  M

## 2014-11-23 NOTE — Progress Notes (Signed)
[facility] day:  11/23/2014  Chief Complaint: Shelley Fields is a 79 y.o. female with a stage IV endometrial carcinoma and a history of pulmonary embolism on Coumadin who is seen for reassessment.  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 She and her daughter unclear of the details of. She was treated in [place] 8 years ago.  The patient was diagnosed with endometrial carcinoma in 2012. The patient states that this was discovered at hysterectomy. 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 Tumor was estrogen receptor negative.  She was initially felt to have stage IIIC disease but later restaged to stage IV based on likely liver metastasis. The patient was treated with carboplatin and Taxol for 11 cycles (10/07/2010 until 06/29/2011). She required dose modifications secondary to cytopenias.  The patient has been followed with serial CA 125s. There has been a steady increase in her CA 125 (1936 on 06/20/2014 and 4187 on 09/05/2014). She has not been treated by Dr. Inez Pilgrim secondary being asymptomatic. Notes indicate imaging studies revealed slight nodal progression.  Abdominal and pelvic CT scan on 11/21/2014 revealed progressive nodal metastatic disease throughout the retroperitoneum. A precaval node measured 4.2 x 3.6 cm (previously 3.8 x 3.5 cm) the left periaortic node measured 3.6 x 2.6 cm (previously 3 x 2.4 cm) There was no evidence of extranodal metastasis in the abdomen or pelvis although imaging was limited by no contrast imaging. There was a persistent concern of a lingular nodule.  Her course has been complicated by a pulmonary embolism diagnosed in 08/2010. She is on Coumadin. She is on Coumadin 0.5 mg a day.  INR was 1.52 on 11/17/2014.  She has a history of delayed wound healing and MRSA infection. She has a history of recurrent  C. difficile infection and hypogammaglobulinemia.  She received IVIG in addition to antibiotic treatment.  The patient states that she was [facility] for 100 days following an admission to [facility] from 02/22-02/29/2016.  She had been admitted for a UTI sepsis, sacral decubitus, increased weakness, acute renal insufficiency, and chronic diastolic dysfunction. She was discharged her from [facility] as her insurance coverage ran out.  According to her daughter, she made little progress as she did not eat right or participate much with physical therapy.  She has been followed for 3 weeks by home care.  The patient spends her day either in bed or in a recliner. Her sister helps out. She does not cook but eats microwave food. She dresses on her own. She wears a depends. Her appetite and diet is poor.  She denies any abdominal symptoms. Her biggest complaint is fatigue and "a bladder leak".  Past Medical History  Diagnosis Date  . Diabetes mellitus without complication   . C. difficile diarrhea   . Uterine cancer   . Cervical cancer   . Hypogammaglobulinemia   . Pulmonary embolism   . Decubitus ulcers   . Hypertension   . Hypercholesterolemia   . Incontinence of urine     Past Surgical History  Procedure Laterality Date  . Total abdominal hysterectomy w/ bilateral salpingoophorectomy  07/29/2010  . Stent in leg  2016    No family history on file.  Social History:  reports that she has never smoked. She does not have any smokeless tobacco history on file. Her alcohol and drug histories are not  on file.  The patient is accompanied by her daughter, Shelley Fields, from [name].  Allergies:  Allergies  Allergen Reactions  . Contrast Media [Iodinated Diagnostic Agents]     Current Medications: Current Outpatient Prescriptions  Medication Sig Dispense Refill  . amLODipine (NORVASC) 5 MG tablet TK 1 T PO QD  0  . atenolol (TENORMIN) 50 MG tablet Take 50 mg by mouth daily.    . benazepril  (LOTENSIN) 40 MG tablet Take 40 mg by mouth daily.    . cephALEXin (KEFLEX) 500 MG capsule Take 1 capsule (500 mg total) by mouth 4 (four) times daily. 28 capsule 0  . cloNIDine (CATAPRES) 0.2 MG tablet Take 0.2 mg by mouth 2 (two) times daily.    . furosemide (LASIX) 20 MG tablet Take 20 mg by mouth every other day.    Marland Kitchen HUMULIN 70/30 (70-30) 100 UNIT/ML injection 16 Units daily at 6 PM.    . hydrALAZINE (APRESOLINE) 100 MG tablet Take 100 mg by mouth 2 (two) times daily.    Marland Kitchen lovastatin (MEVACOR) 20 MG tablet Take 20 mg by mouth daily.    . meclizine (ANTIVERT) 25 MG tablet Take 25 mg by mouth daily.    . mirtazapine (REMERON) 15 MG tablet Take 15 mg by mouth daily.    Marland Kitchen NOVOLOG FLEXPEN 100 UNIT/ML FlexPen Per Sliding Scale...    . nystatin (MYCOSTATIN) powder     . omeprazole (PRILOSEC) 20 MG capsule Take 20 mg by mouth daily.  12  . ONE TOUCH ULTRA TEST test strip     . potassium chloride (K-DUR) 10 MEQ tablet Take 10 mEq by mouth every other day.    . promethazine (PHENERGAN) 12.5 MG tablet Take 12.5 mg by mouth every 6 (six) hours as needed.    Marland Kitchen SANTYL ointment     . tamsulosin (FLOMAX) 0.4 MG CAPS capsule Take 0.4 mg by mouth 2 (two) times daily.    Marland Kitchen warfarin (COUMADIN) 1 MG tablet Take 0.5 mg by mouth daily.    . megestrol (MEGACE ES) 625 MG/5ML suspension Take 1.6 mLs (200 mg total) by mouth daily. to stimulate appetite 150 mL 0   No current facility-administered medications for this visit.   Facility-Administered Medications Ordered in Other Visits  Medication Dose Route Frequency Provider Last Rate Last Dose  . sodium chloride 0.9 % injection 10 mL  10 mL Intravenous PRN Evlyn Kanner, NP   10 mL at 11/02/14 0263    Review of Systems:  GENERAL:  Fatigue.  Weakness.  Minimal activity.  No fevers, sweats.  Unspecified weight loss. PERFORMANCE STATUS (ECOG):  3 HEENT:  No visual changes, runny nose, sore throat, mouth sores or tenderness. Lungs: No shortness of breath or  cough.  No hemoptysis. Cardiac:  No chest pain, palpitations, orthopnea, or PND. GI:  Poor appetitite.  No nausea, vomiting, diarrhea, constipation, melena or hematochezia. GU:  Bladder leak.  No urgency, frequency, dysuria, or hematuria. Musculoskeletal:  No back pain.  No joint pain.  No muscle tenderness. Extremities:  No pain or swelling. Skin:  Pressure sores back and heel not healed.  Neuro:  General weakness.  No headache, numbness or weakness, balance or coordination issues. Endocrine:  Diabetes.  No thyroid issues, hot flashes or night sweats. Psych:  No mood changes, depression or anxiety. Pain:  No focal pain. Review of systems:  All other systems reviewed and found to be negative.  Physical Exam: Blood pressure 144/98, pulse 99, temperature 98.5 F (  36.9 C), temperature source Tympanic, resp. rate 16, weight 151 lb 14.4 oz (68.9 kg), SpO2 98 %. GENERAL:  Chronically ill appearing woman sitting comfortably in a wheelchair under a blanket in no acute distress. MENTAL STATUS:  Alert and oriented to person, place and time. HEAD: Pearline Cables hair.  Normocephalic, atraumatic, face symmetric, no Cushingoid features. EYES:  Pupils equal round and reactive to light and accomodation.  No conjunctivitis or scleral icterus. ENT:  Oropharynx clear without lesion.  Tongue normal. Mucous membranes moist.  RESPIRATORY:  Clear to auscultation without rales, wheezes or rhonchi. CARDIOVASCULAR:  Regular rate and rhythm without murmur, rub or gallop. ABDOMEN:  Soft, non-tender, with active bowel sounds, and no hepatosplenomegaly.  No masses. SKIN:  Ankle bandage in place over healing ankle/heel ulcer. EXTREMITIES: No edema, no skin discoloration or tenderness.  No palpable cords. LYMPH NODES: No palpable cervical, supraclavicular, axillary or inguinal adenopathy  NEUROLOGICAL: Unremarkable. PSYCH:  Appropriate.  Clinical Support on 11/23/2014  Component Date Value Ref Range Status  . Prothrombin  Time 11/23/2014 20.2* 11.4 - 15.0 seconds Final  . INR 11/23/2014 1.70   Final  Appointment on 11/21/2014  Component Date Value Ref Range Status  . CA 125 11/21/2014 4186.0* 0.0 - 38.1 U/mL Final   Comment: (NOTE) .                            **Please note reference interval change** [employer] ECLIA methodology Performed At: [facility] [place], [place] [id number] Lindon Romp MD FY:[id number]     Assessment:  KANEISHA ELLENBERGER is a 79 y.o. female with recurrent stage IV endoemtrial carcinoma.  She underwent TAH/BSO on 07/29/2010.  She was initially felt to have have IIIC disease, but later stage IV disease secondary to likely liver metastasis.  Tumor was ER negative.  She received 11 cycles of carboplatin and Taxol (10/07/2010 until 06/29/2011). She required dose modifications secondary to cytopenias.  The patient has been followed with serial CA 125s. There has been a steady increase in her CA 125 (1936 on 06/20/2014 and 4187 on 09/05/2014). She has not been treated by Dr. Inez Pilgrim secondary being asymptomatic. Notes indicate imaging studies revealed slight nodal progression.  Abdominal and pelvic CT scan on 11/21/2014 revealed progressive nodal metastatic disease throughout the retroperitoneum. A precaval node measured 4.2 x 3.6 cm (previously 3.8 x 3.5 cm) the left periaortic node measured 3.6 x 2.6 cm (previously 3 x 2.4 cm) There was no evidence of extranodal metastasis in the abdomen or pelvis although imaging was limited by nocontrast. There was a persistent concern of a lingular nodule.  Her course has been complicated by a pulmonary embolism in 08/2010. She is on chronic Coumadin (0.5 mg a day). She has a history of recurrent C. difficile infection and hypogammaglobulinemia.  She has a recent history of UTI sepsis, sacral decubitus, and acute renal insufficiency.   She was at [facility] for 100 days.  She is currently followed by home health.  She is making very  little progress.  She is losing weight.  She denies any abdominal symptoms.  Plan: 1. Review entire medical history and treatment to date 2. Discussed recent imaging studies revealing progressive disease. Trend of CA-125 was reviewed. 3. Discuss current inability to treat the patient given her poor performance status.  Discuss treatment options (retreat with carboplatin and Taxol or single agent therapy).  Doubt benefit of hormonal therapy given hormone receptor  negative disease. 4. Phone follow-up with her primary care physician, Dr. Larene Beach, regarding admission to skilled nursing facility. 5. Disuss caloric intake.  Discuss appetite stimulation with Megace.  Begin 200 mg a day.  Plan to increase weekly as needed to maximum 800 mg a day. 6. Labs today:  PT/INR, CA125. 7. Contact patient with dose adjustment of Coumadin. 8. Referral to GYN Oncology. 9. RTC in 1 week for MD assessment and labs (PT/INR).  Lequita Asal, MD  11/23/2014, 4:47 PM

## 2014-11-24 ENCOUNTER — Encounter: Payer: Self-pay | Admitting: Hematology and Oncology

## 2014-11-25 ENCOUNTER — Telehealth: Payer: Self-pay | Admitting: Hematology and Oncology

## 2014-11-25 DIAGNOSIS — I10 Essential (primary) hypertension: Secondary | ICD-10-CM | POA: Diagnosis not present

## 2014-11-25 DIAGNOSIS — L89150 Pressure ulcer of sacral region, unstageable: Secondary | ICD-10-CM | POA: Diagnosis not present

## 2014-11-25 DIAGNOSIS — I4891 Unspecified atrial fibrillation: Secondary | ICD-10-CM | POA: Diagnosis not present

## 2014-11-25 DIAGNOSIS — I5032 Chronic diastolic (congestive) heart failure: Secondary | ICD-10-CM | POA: Diagnosis not present

## 2014-11-25 DIAGNOSIS — I119 Hypertensive heart disease without heart failure: Secondary | ICD-10-CM | POA: Diagnosis not present

## 2014-11-25 NOTE — Telephone Encounter (Signed)
Re:  INR and Megace  I contacted the patient's daughter about her INR.  INR was subtherapeutic.  She is currently taking 0.5 mg a day (1/2 of a 1 mg tablet).  She will take 1 tablet tonight and then back to 1/2 tablet a night beginning tomorrow.  INR will be checked end of next week.  I stated that the Megace had been escribed to the pharmacy.  Dose was 200 mg a day. The higher concentration (625 mg/ml) was escribed, so the dose is 1.6 ml rather than the dose previously discussed (5 ml of a concentration of 40 mg/ml).  I told her to ensure the mg amount was the same (200 mg).  Lequita Asal, MD

## 2014-11-26 ENCOUNTER — Encounter: Payer: Self-pay | Admitting: Hematology and Oncology

## 2014-11-26 ENCOUNTER — Telehealth: Payer: Self-pay | Admitting: Family Medicine

## 2014-11-26 NOTE — Telephone Encounter (Signed)
Pt's daughter [name] requested a call.  She has some information for Dr. Luan Pulling from pt's cancer doctor, Dr Mike Gip.  She said Dr. Luan Pulling could call [facility] and get Dr. Kem Parkinson cell number and call to get update on pt.  Rosa also has a new copy of Advanced Directives that she can provide if we don't have access to hospital records.  Please call Rosa at [phone]

## 2014-11-26 NOTE — Telephone Encounter (Signed)
States she is picking it up now, had to be ordered by pharmacy

## 2014-11-27 ENCOUNTER — Telehealth: Payer: Self-pay | Admitting: Family Medicine

## 2014-11-27 DIAGNOSIS — L89322 Pressure ulcer of left buttock, stage 2: Secondary | ICD-10-CM | POA: Diagnosis not present

## 2014-11-27 DIAGNOSIS — I10 Essential (primary) hypertension: Secondary | ICD-10-CM | POA: Diagnosis not present

## 2014-11-27 DIAGNOSIS — M25552 Pain in left hip: Secondary | ICD-10-CM | POA: Diagnosis not present

## 2014-11-27 DIAGNOSIS — N393 Stress incontinence (female) (male): Secondary | ICD-10-CM | POA: Diagnosis not present

## 2014-11-27 DIAGNOSIS — L89622 Pressure ulcer of left heel, stage 2: Secondary | ICD-10-CM | POA: Diagnosis not present

## 2014-11-27 DIAGNOSIS — L89312 Pressure ulcer of right buttock, stage 2: Secondary | ICD-10-CM | POA: Diagnosis not present

## 2014-11-27 DIAGNOSIS — F328 Other depressive episodes: Secondary | ICD-10-CM | POA: Diagnosis not present

## 2014-11-27 DIAGNOSIS — I4891 Unspecified atrial fibrillation: Secondary | ICD-10-CM | POA: Diagnosis not present

## 2014-11-27 DIAGNOSIS — L989 Disorder of the skin and subcutaneous tissue, unspecified: Secondary | ICD-10-CM | POA: Diagnosis not present

## 2014-11-27 DIAGNOSIS — I739 Peripheral vascular disease, unspecified: Secondary | ICD-10-CM | POA: Diagnosis not present

## 2014-11-27 DIAGNOSIS — D649 Anemia, unspecified: Secondary | ICD-10-CM | POA: Diagnosis not present

## 2014-11-27 DIAGNOSIS — E119 Type 2 diabetes mellitus without complications: Secondary | ICD-10-CM | POA: Diagnosis not present

## 2014-11-27 DIAGNOSIS — I5032 Chronic diastolic (congestive) heart failure: Secondary | ICD-10-CM | POA: Diagnosis not present

## 2014-11-27 NOTE — Telephone Encounter (Signed)
Spoke to daughter about going to [place] to get more recommendations. She will call me Friday as she goes back home Saturday and wants to know what we can do. She said maybe she needs more home Health or Skilled Nursing. I advised her to see what they say Friday. The message attached is from [facility] and I am unsure what this means. PLease review.

## 2014-11-27 NOTE — Telephone Encounter (Signed)
Pt. daughter called requesting you call her back the   # is  [phone],  She wanted to know if Dr.Corcoran sent info to you.

## 2014-11-27 NOTE — Telephone Encounter (Signed)
See next message quick note re: Shelley Fields

## 2014-11-27 NOTE — Telephone Encounter (Signed)
This number is incorrect. Please research to see if I can even speak to this person and also see if you can locate the correct number. Thanks.

## 2014-11-27 NOTE — Telephone Encounter (Signed)
Shelley Fields called wanting to see if there was an order to replace cath or if Dr. Luan Pulling thought the measures currently in place were fine.   CallbackMarcello Fields [phone]

## 2014-11-27 NOTE — Telephone Encounter (Signed)
Agree with getting Oncology recommendation. I do not know about the call about the catheter.  That should probably be addressed by oncology also.  We'll see what Oncology tells she and family on Friday (11/30/14).

## 2014-11-28 NOTE — Telephone Encounter (Signed)
[place].

## 2014-11-29 DIAGNOSIS — L89322 Pressure ulcer of left buttock, stage 2: Secondary | ICD-10-CM | POA: Diagnosis not present

## 2014-11-29 DIAGNOSIS — L89622 Pressure ulcer of left heel, stage 2: Secondary | ICD-10-CM | POA: Diagnosis not present

## 2014-11-29 DIAGNOSIS — I10 Essential (primary) hypertension: Secondary | ICD-10-CM | POA: Diagnosis not present

## 2014-11-29 DIAGNOSIS — M25552 Pain in left hip: Secondary | ICD-10-CM | POA: Diagnosis not present

## 2014-11-29 DIAGNOSIS — I5032 Chronic diastolic (congestive) heart failure: Secondary | ICD-10-CM | POA: Diagnosis not present

## 2014-11-29 DIAGNOSIS — I739 Peripheral vascular disease, unspecified: Secondary | ICD-10-CM | POA: Diagnosis not present

## 2014-11-29 DIAGNOSIS — D649 Anemia, unspecified: Secondary | ICD-10-CM | POA: Diagnosis not present

## 2014-11-29 DIAGNOSIS — N393 Stress incontinence (female) (male): Secondary | ICD-10-CM | POA: Diagnosis not present

## 2014-11-29 DIAGNOSIS — E119 Type 2 diabetes mellitus without complications: Secondary | ICD-10-CM | POA: Diagnosis not present

## 2014-11-29 DIAGNOSIS — F328 Other depressive episodes: Secondary | ICD-10-CM | POA: Diagnosis not present

## 2014-11-29 DIAGNOSIS — I4891 Unspecified atrial fibrillation: Secondary | ICD-10-CM | POA: Diagnosis not present

## 2014-11-29 DIAGNOSIS — L89312 Pressure ulcer of right buttock, stage 2: Secondary | ICD-10-CM | POA: Diagnosis not present

## 2014-11-29 DIAGNOSIS — L989 Disorder of the skin and subcutaneous tissue, unspecified: Secondary | ICD-10-CM | POA: Diagnosis not present

## 2014-11-30 ENCOUNTER — Inpatient Hospital Stay: Payer: Medicare Other

## 2014-11-30 ENCOUNTER — Telehealth: Payer: Self-pay | Admitting: Family Medicine

## 2014-11-30 ENCOUNTER — Inpatient Hospital Stay (HOSPITAL_BASED_OUTPATIENT_CLINIC_OR_DEPARTMENT_OTHER): Payer: Medicare Other | Admitting: Hematology and Oncology

## 2014-11-30 VITALS — BP 119/56 | HR 80 | Temp 97.4°F | Resp 20 | Ht 60.0 in | Wt 156.4 lb

## 2014-11-30 DIAGNOSIS — Z86711 Personal history of pulmonary embolism: Secondary | ICD-10-CM

## 2014-11-30 DIAGNOSIS — C549 Malignant neoplasm of corpus uteri, unspecified: Secondary | ICD-10-CM

## 2014-11-30 DIAGNOSIS — Z794 Long term (current) use of insulin: Secondary | ICD-10-CM | POA: Diagnosis not present

## 2014-11-30 DIAGNOSIS — Z79899 Other long term (current) drug therapy: Secondary | ICD-10-CM | POA: Diagnosis not present

## 2014-11-30 DIAGNOSIS — Z8541 Personal history of malignant neoplasm of cervix uteri: Secondary | ICD-10-CM | POA: Diagnosis not present

## 2014-11-30 DIAGNOSIS — B373 Candidiasis of vulva and vagina: Secondary | ICD-10-CM

## 2014-11-30 DIAGNOSIS — I82409 Acute embolism and thrombosis of unspecified deep veins of unspecified lower extremity: Secondary | ICD-10-CM

## 2014-11-30 DIAGNOSIS — B3731 Acute candidiasis of vulva and vagina: Secondary | ICD-10-CM

## 2014-11-30 DIAGNOSIS — C541 Malignant neoplasm of endometrium: Secondary | ICD-10-CM

## 2014-11-30 DIAGNOSIS — I10 Essential (primary) hypertension: Secondary | ICD-10-CM | POA: Diagnosis not present

## 2014-11-30 DIAGNOSIS — E780 Pure hypercholesterolemia: Secondary | ICD-10-CM | POA: Diagnosis not present

## 2014-11-30 DIAGNOSIS — R3914 Feeling of incomplete bladder emptying: Secondary | ICD-10-CM | POA: Diagnosis not present

## 2014-11-30 DIAGNOSIS — R5383 Other fatigue: Secondary | ICD-10-CM | POA: Diagnosis not present

## 2014-11-30 DIAGNOSIS — Z7901 Long term (current) use of anticoagulants: Secondary | ICD-10-CM

## 2014-11-30 DIAGNOSIS — C779 Secondary and unspecified malignant neoplasm of lymph node, unspecified: Secondary | ICD-10-CM | POA: Diagnosis not present

## 2014-11-30 DIAGNOSIS — D801 Nonfamilial hypogammaglobulinemia: Secondary | ICD-10-CM

## 2014-11-30 DIAGNOSIS — K589 Irritable bowel syndrome without diarrhea: Secondary | ICD-10-CM | POA: Diagnosis not present

## 2014-11-30 DIAGNOSIS — Z8614 Personal history of Methicillin resistant Staphylococcus aureus infection: Secondary | ICD-10-CM | POA: Diagnosis not present

## 2014-11-30 DIAGNOSIS — E119 Type 2 diabetes mellitus without complications: Secondary | ICD-10-CM | POA: Diagnosis not present

## 2014-11-30 DIAGNOSIS — R32 Unspecified urinary incontinence: Secondary | ICD-10-CM

## 2014-11-30 DIAGNOSIS — R21 Rash and other nonspecific skin eruption: Secondary | ICD-10-CM

## 2014-11-30 DIAGNOSIS — N3945 Continuous leakage: Secondary | ICD-10-CM

## 2014-11-30 DIAGNOSIS — R5381 Other malaise: Secondary | ICD-10-CM

## 2014-11-30 DIAGNOSIS — Z9221 Personal history of antineoplastic chemotherapy: Secondary | ICD-10-CM | POA: Diagnosis not present

## 2014-11-30 LAB — PROTIME-INR
INR: 1.24
Prothrombin Time: 15.8 seconds — ABNORMAL HIGH (ref 11.4–15.0)

## 2014-11-30 NOTE — Telephone Encounter (Signed)
Dr. Mike Gip office called for Dr. Luan Pulling and as per Dr. Luan Pulling Pt is very depressed, not participating in care has deep decubiti, [place] rash and nutrition bed and Dr. Mike Gip refer to hospice but like to do without hospice. Pt has foley cath and wound care done today 11/30/2014 and appointment with Amy next week.

## 2014-12-04 ENCOUNTER — Telehealth: Payer: Self-pay | Admitting: Family Medicine

## 2014-12-04 ENCOUNTER — Telehealth: Payer: Self-pay | Admitting: *Deleted

## 2014-12-04 ENCOUNTER — Ambulatory Visit: Payer: Self-pay | Admitting: Family Medicine

## 2014-12-04 DIAGNOSIS — I739 Peripheral vascular disease, unspecified: Secondary | ICD-10-CM | POA: Diagnosis not present

## 2014-12-04 DIAGNOSIS — I5032 Chronic diastolic (congestive) heart failure: Secondary | ICD-10-CM | POA: Diagnosis not present

## 2014-12-04 DIAGNOSIS — L989 Disorder of the skin and subcutaneous tissue, unspecified: Secondary | ICD-10-CM | POA: Diagnosis not present

## 2014-12-04 DIAGNOSIS — L89312 Pressure ulcer of right buttock, stage 2: Secondary | ICD-10-CM | POA: Diagnosis not present

## 2014-12-04 DIAGNOSIS — F328 Other depressive episodes: Secondary | ICD-10-CM | POA: Diagnosis not present

## 2014-12-04 DIAGNOSIS — N393 Stress incontinence (female) (male): Secondary | ICD-10-CM | POA: Diagnosis not present

## 2014-12-04 DIAGNOSIS — E119 Type 2 diabetes mellitus without complications: Secondary | ICD-10-CM | POA: Diagnosis not present

## 2014-12-04 DIAGNOSIS — L89622 Pressure ulcer of left heel, stage 2: Secondary | ICD-10-CM | POA: Diagnosis not present

## 2014-12-04 DIAGNOSIS — M25552 Pain in left hip: Secondary | ICD-10-CM | POA: Diagnosis not present

## 2014-12-04 DIAGNOSIS — L89322 Pressure ulcer of left buttock, stage 2: Secondary | ICD-10-CM | POA: Diagnosis not present

## 2014-12-04 DIAGNOSIS — I10 Essential (primary) hypertension: Secondary | ICD-10-CM | POA: Diagnosis not present

## 2014-12-04 DIAGNOSIS — I4891 Unspecified atrial fibrillation: Secondary | ICD-10-CM | POA: Diagnosis not present

## 2014-12-04 DIAGNOSIS — D649 Anemia, unspecified: Secondary | ICD-10-CM | POA: Diagnosis not present

## 2014-12-04 NOTE — Telephone Encounter (Signed)
Advised after discussing with Dr Mike Gip that Dr Luan Pulling is to get her placed adnwas to discuss it at the appt this morning. Shelley Fields she will call Dr Luan Pulling office since she was unable to get to appt this morning

## 2014-12-04 NOTE — Telephone Encounter (Signed)
Last call we discussed Hospice though family wanted increased home health. They were set up to discuss FL2 forms but now on way to ED. Just FYI. Found patient sitting in floor in her own urine and very distraught. Family having hard time caring for patient. Will see what ER recommends.

## 2014-12-04 NOTE — Telephone Encounter (Signed)
Will await ER evaluation and decision about care.  She probably needs to be admitted to be referred to skilled care or hospice.

## 2014-12-04 NOTE — Telephone Encounter (Signed)
Spoke to sister who states the pt has fallen and is incapable of self care. Sister states she needs assistance getting her sister in [facility] or hospices. Appointment for 12/04/14 at 8:30am has been cancelled due to fall today and pt being rushed to the ED.

## 2014-12-04 NOTE — Telephone Encounter (Signed)
  Patient needs either placement or Hospice.  Daughter went back home.  Sister can't care for her. She was to see PCP today, but fell outside home.  Please call PCP's nurse to relay message.  M

## 2014-12-06 DIAGNOSIS — I10 Essential (primary) hypertension: Secondary | ICD-10-CM | POA: Diagnosis not present

## 2014-12-06 DIAGNOSIS — N393 Stress incontinence (female) (male): Secondary | ICD-10-CM | POA: Diagnosis not present

## 2014-12-06 DIAGNOSIS — E119 Type 2 diabetes mellitus without complications: Secondary | ICD-10-CM | POA: Diagnosis not present

## 2014-12-06 DIAGNOSIS — L989 Disorder of the skin and subcutaneous tissue, unspecified: Secondary | ICD-10-CM | POA: Diagnosis not present

## 2014-12-06 DIAGNOSIS — L89312 Pressure ulcer of right buttock, stage 2: Secondary | ICD-10-CM | POA: Diagnosis not present

## 2014-12-06 DIAGNOSIS — F328 Other depressive episodes: Secondary | ICD-10-CM | POA: Diagnosis not present

## 2014-12-06 DIAGNOSIS — L89622 Pressure ulcer of left heel, stage 2: Secondary | ICD-10-CM | POA: Diagnosis not present

## 2014-12-06 DIAGNOSIS — M25552 Pain in left hip: Secondary | ICD-10-CM | POA: Diagnosis not present

## 2014-12-06 DIAGNOSIS — L89322 Pressure ulcer of left buttock, stage 2: Secondary | ICD-10-CM | POA: Diagnosis not present

## 2014-12-06 DIAGNOSIS — D649 Anemia, unspecified: Secondary | ICD-10-CM | POA: Diagnosis not present

## 2014-12-06 DIAGNOSIS — I739 Peripheral vascular disease, unspecified: Secondary | ICD-10-CM | POA: Diagnosis not present

## 2014-12-06 DIAGNOSIS — I4891 Unspecified atrial fibrillation: Secondary | ICD-10-CM | POA: Diagnosis not present

## 2014-12-06 DIAGNOSIS — I5032 Chronic diastolic (congestive) heart failure: Secondary | ICD-10-CM | POA: Diagnosis not present

## 2014-12-07 DIAGNOSIS — I4891 Unspecified atrial fibrillation: Secondary | ICD-10-CM | POA: Diagnosis not present

## 2014-12-07 DIAGNOSIS — I10 Essential (primary) hypertension: Secondary | ICD-10-CM | POA: Diagnosis not present

## 2014-12-07 DIAGNOSIS — D649 Anemia, unspecified: Secondary | ICD-10-CM | POA: Diagnosis not present

## 2014-12-07 DIAGNOSIS — L89322 Pressure ulcer of left buttock, stage 2: Secondary | ICD-10-CM | POA: Diagnosis not present

## 2014-12-07 DIAGNOSIS — L989 Disorder of the skin and subcutaneous tissue, unspecified: Secondary | ICD-10-CM | POA: Diagnosis not present

## 2014-12-07 DIAGNOSIS — I739 Peripheral vascular disease, unspecified: Secondary | ICD-10-CM | POA: Diagnosis not present

## 2014-12-07 DIAGNOSIS — I5032 Chronic diastolic (congestive) heart failure: Secondary | ICD-10-CM | POA: Diagnosis not present

## 2014-12-07 DIAGNOSIS — N393 Stress incontinence (female) (male): Secondary | ICD-10-CM | POA: Diagnosis not present

## 2014-12-07 DIAGNOSIS — F328 Other depressive episodes: Secondary | ICD-10-CM | POA: Diagnosis not present

## 2014-12-07 DIAGNOSIS — E119 Type 2 diabetes mellitus without complications: Secondary | ICD-10-CM | POA: Diagnosis not present

## 2014-12-07 DIAGNOSIS — M25552 Pain in left hip: Secondary | ICD-10-CM | POA: Diagnosis not present

## 2014-12-07 DIAGNOSIS — L89622 Pressure ulcer of left heel, stage 2: Secondary | ICD-10-CM | POA: Diagnosis not present

## 2014-12-07 DIAGNOSIS — L89312 Pressure ulcer of right buttock, stage 2: Secondary | ICD-10-CM | POA: Diagnosis not present

## 2014-12-10 ENCOUNTER — Encounter: Payer: Self-pay | Admitting: Hematology and Oncology

## 2014-12-10 NOTE — Progress Notes (Signed)
[place] [facility] day:  11/30/2014  Chief Complaint: Shelley Fields is a 79 y.o. female with a stage IV endometrial carcinoma and a history of pulmonary embolism on Coumadin who is seen for 1 week reassessment.  HPI:  The patient was last seen in medical oncology clinic on 11/23/2014.  At that time, she was noted to have stage IV endometrial cancer. Last imaging studies in 11/2014 revealed progressive nodal metastatic disease throughout the retroperitoneum. CA-125 was 4186.  She was on chronic Coumadin for a pulmonary embolism documented in 08/2010. She had a recent history of UTI sepsis, sacral decubitus ulcer, and renal insufficiency. She had been at [facility] for 100 days without significant improvement. She was losing weight. She denies any abdominal symptoms.  We discussed her general debilitation and need for caloric intake. An appetite stimulant was discussed. She was begun on Megace 200 mg a day. Given her poor performance status, chemotherapy was not recommended.  INR was therapeutic on Coumadin 0.5 mg a day. She increased her Coumadin to 1 mg 1 day a week (Sunday) and 0.5 mg 6 days a week.   During the interim, she has started to eat a little more.  She is eating small amounts.  Boost was picked up.  She is not doing much exercising (2x/week).  She has a "horrible diaper rash" as she sits in her urine.  While at [facility], she had a Foley catheter.  She has sacral pressure sores.She has home care.  Past Medical History  Diagnosis Date  . Diabetes mellitus without complication   . C. difficile diarrhea   . Uterine cancer   . Hypogammaglobulinemia   . Pulmonary embolism   . Decubitus ulcers   . Hypertension   . Hypercholesterolemia   . Incontinence of urine   . Cancer     Endometrial Cancer  . Cervical cancer     with radiation 45 years ago...  . Arthritis   . GERD (gastroesophageal reflux disease)   . Bladder cancer   . Decubitus  ulcer of buttock, stage 1     Past Surgical History  Procedure Laterality Date  . Total abdominal hysterectomy w/ bilateral salpingoophorectomy  07/29/2010  . Stent in leg  2016  . Cataract extraction, bilateral    . Bladder surgery    . Femoral endarterectomy      right  . Leg surgery      right leg  . Portacath placement      right    No family history on file.  Social History:  reports that she has never smoked. She does not have any smokeless tobacco history on file. Her alcohol and drug histories are not on file.  The patient is accompanied by her daughter, Shelley Fields, from [name] and her sister Shelley Fields.  Her sister can not care for her.  He daughter's cell number is [phone].  She will be returning to [name] soon.  Allergies:  Allergies  Allergen Reactions  . Contrast Media [Iodinated Diagnostic Agents]     Current Medications: Current Outpatient Prescriptions  Medication Sig Dispense Refill  . amLODipine (NORVASC) 5 MG tablet TK 1 T PO QD  0  . atenolol (TENORMIN) 50 MG tablet Take 50 mg by mouth daily.    . benazepril (LOTENSIN) 40 MG tablet Take 40 mg by mouth daily.    . cephALEXin (KEFLEX) 500 MG capsule Take 1 capsule (500 mg total) by mouth 4 (four)  times daily. 28 capsule 0  . cloNIDine (CATAPRES) 0.2 MG tablet Take 0.2 mg by mouth 2 (two) times daily.    . furosemide (LASIX) 20 MG tablet Take 20 mg by mouth every other day.    Marland Kitchen HUMULIN 70/30 (70-30) 100 UNIT/ML injection 16 Units daily at 6 PM.    . hydrALAZINE (APRESOLINE) 100 MG tablet Take 100 mg by mouth 2 (two) times daily.    Marland Kitchen lovastatin (MEVACOR) 20 MG tablet Take 20 mg by mouth daily.    . meclizine (ANTIVERT) 25 MG tablet Take 25 mg by mouth daily.    . megestrol (MEGACE ES) 625 MG/5ML suspension Take 1.6 mLs (200 mg total) by mouth daily. to stimulate appetite 150 mL 0  . mirtazapine (REMERON) 15 MG tablet Take 15 mg by mouth daily.    Marland Kitchen NOVOLOG FLEXPEN 100 UNIT/ML FlexPen Per Sliding  Scale...    . nystatin (MYCOSTATIN) powder     . omeprazole (PRILOSEC) 20 MG capsule Take 20 mg by mouth daily.  12  . ONE TOUCH ULTRA TEST test strip     . potassium chloride (K-DUR) 10 MEQ tablet Take 10 mEq by mouth every other day.    . promethazine (PHENERGAN) 12.5 MG tablet Take 12.5 mg by mouth every 6 (six) hours as needed.    Marland Kitchen SANTYL ointment     . tamsulosin (FLOMAX) 0.4 MG CAPS capsule Take 0.4 mg by mouth 2 (two) times daily.    Marland Kitchen warfarin (COUMADIN) 1 MG tablet Take 0.5 mg by mouth daily.     No current facility-administered medications for this visit.   Facility-Administered Medications Ordered in Other Visits  Medication Dose Route Frequency Provider Last Rate Last Dose  . sodium chloride 0.9 % injection 10 mL  10 mL Intravenous PRN Evlyn Kanner, NP   10 mL at 11/02/14 5188    Review of Systems:  GENERAL:  General fatigue and weakness.  Minimal activity.  No fevers, sweats.  Weight gain in past week. PERFORMANCE STATUS (ECOG):  3 HEENT:  No visual changes, runny nose, sore throat, mouth sores or tenderness. Lungs: No shortness of breath or cough.  No hemoptysis. Cardiac:  No chest pain, palpitations, orthopnea, or PND. GI:  Appetite slightly better on Megace.  No nausea, vomiting, diarrhea, constipation, melena or hematochezia. GU:  Bladder leak.  Diaper rash.  No urgency, frequency, dysuria, or hematuria. Musculoskeletal:  No back pain.  No joint pain.  No muscle tenderness. Extremities:  No pain or swelling. Skin:  Pressure sores back and heel not healed.  Neuro:  General weakness.  No headache, numbness or weakness, balance or coordination issues. Endocrine:  Diabetes.  No thyroid issues, hot flashes or night sweats. Psych:  No mood changes, depression or anxiety. Pain:  No focal pain. Review of systems:  All other systems reviewed and found to be negative.  Physical Exam: Blood pressure 119/56, pulse 80, temperature 97.4 F (36.3 C), temperature source  Oral, resp. rate 20, height 5' (1.524 m), weight 156 lb 6.7 oz (70.95 kg). GENERAL:  Chronically ill appearing woman sitting comfortably in a wheelchair in no acute distress. MENTAL STATUS:  Alert and oriented to person, place and time. HEAD: Pearline Cables hair.  Normocephalic, atraumatic, face symmetric, no Cushingoid features.  Left upper cheek eschar. EYES:  No conjunctivitis or scleral icterus. ENT:  Oropharynx clear without lesion.  No thrush.  RESPIRATORY:  Clear to auscultation without rales, wheezes or rhonchi. CARDIOVASCULAR:  Regular rate and rhythm  without murmur, rub or gallop. ABDOMEN:  Soft, non-tender, with active bowel sounds, and no hepatosplenomegaly.  No masses. GENITOURINARY:  Erythema secondary to Candida rash. SKIN:  Bandage over heel ulcer. EXTREMITIES: No edema, no skin discoloration or tenderness.  No palpable cords. NEUROLOGICAL: Unremarkable. PSYCH:  Appropriate.  Appointment on 11/30/2014  Component Date Value Ref Range Status  . Prothrombin Time 11/30/2014 15.8* 11.4 - 15.0 seconds Final  . INR 11/30/2014 1.24   Final    Assessment:  BERNETTA SUTLEY is a 79 y.o. female with recurrent stage IV endoemtrial carcinoma.  She underwent TAH/BSO on 07/29/2010.  She was initially felt to have have IIIC disease, but later stage IV disease secondary to likely liver metastasis.  Tumor was ER negative.  She received 11 cycles of carboplatin and Taxol (10/07/2010 until 06/29/2011). She required dose modifications secondary to cytopenias.  The patient has been followed with serial CA 125s. There has been a steady increase in her CA 125 (1936 on 06/20/2014 and 4186 on 11/21/2014). She has not been treated by Dr. Inez Pilgrim secondary being asymptomatic. Notes indicate imaging studies revealed slight nodal progression.  Her performance status limits the ability to treat her endometrial cancer.  Abdominal and pelvic CT scan on 11/21/2014 revealed progressive nodal metastatic disease throughout  the retroperitoneum. A precaval node measured 4.2 x 3.6 cm (previously 3.8 x 3.5 cm) the left periaortic node measured 3.6 x 2.6 cm (previously 3 x 2.4 cm) There was no evidence of extranodal metastasis in the abdomen or pelvis although imaging was limited by nocontrast. There was a persistent concern of a lingular nodule.   Her course has been complicated by a pulmonary embolism in 08/2010. She is on chronic Coumadin (0.5 mg a day). She has a history of recurrent C. difficile infection and hypogammaglobulinemia.  She has a recent history of UTI sepsis, sacral decubitus, and acute renal insufficiency.   She was at [facility] for 100 days.  She is followed by home health.  Appetite and weight has increased slightly on Megace.  She continues to make very little progress.  She denies any abdominal symptoms.  She has a significant Candidal perineal rash secondary to urinary leakage.  Plan: 1.  Labs today:  PT/INR. 2.  Increase Coumadin to 1 mg 2 days a week (spread out) and 0.5 mg 5 days a week. 3.  Patient to follow-up with Dr. Jacqlyn Larsen for Foley catheter placement. 4.  Home health care:  PT/INR in 1 week; Candidal rash treatment. 5.  Wound care management. 6.  Patient to follow-up with PMD next week. 7.  RTC in 2 weeks for MD assessment and labs (PT/INR +/- others).   Lequita Asal, MD

## 2014-12-11 ENCOUNTER — Other Ambulatory Visit: Payer: Self-pay | Admitting: *Deleted

## 2014-12-11 DIAGNOSIS — L89312 Pressure ulcer of right buttock, stage 2: Secondary | ICD-10-CM | POA: Diagnosis not present

## 2014-12-11 DIAGNOSIS — L89322 Pressure ulcer of left buttock, stage 2: Secondary | ICD-10-CM | POA: Diagnosis not present

## 2014-12-11 DIAGNOSIS — L989 Disorder of the skin and subcutaneous tissue, unspecified: Secondary | ICD-10-CM | POA: Diagnosis not present

## 2014-12-11 DIAGNOSIS — I739 Peripheral vascular disease, unspecified: Secondary | ICD-10-CM | POA: Diagnosis not present

## 2014-12-11 DIAGNOSIS — L89622 Pressure ulcer of left heel, stage 2: Secondary | ICD-10-CM | POA: Diagnosis not present

## 2014-12-11 DIAGNOSIS — E119 Type 2 diabetes mellitus without complications: Secondary | ICD-10-CM | POA: Diagnosis not present

## 2014-12-11 DIAGNOSIS — I10 Essential (primary) hypertension: Secondary | ICD-10-CM | POA: Diagnosis not present

## 2014-12-11 DIAGNOSIS — F328 Other depressive episodes: Secondary | ICD-10-CM | POA: Diagnosis not present

## 2014-12-11 DIAGNOSIS — I4891 Unspecified atrial fibrillation: Secondary | ICD-10-CM | POA: Diagnosis not present

## 2014-12-11 DIAGNOSIS — M25552 Pain in left hip: Secondary | ICD-10-CM | POA: Diagnosis not present

## 2014-12-11 DIAGNOSIS — Z86711 Personal history of pulmonary embolism: Secondary | ICD-10-CM

## 2014-12-11 DIAGNOSIS — N393 Stress incontinence (female) (male): Secondary | ICD-10-CM | POA: Diagnosis not present

## 2014-12-11 DIAGNOSIS — I5032 Chronic diastolic (congestive) heart failure: Secondary | ICD-10-CM | POA: Diagnosis not present

## 2014-12-11 DIAGNOSIS — D649 Anemia, unspecified: Secondary | ICD-10-CM | POA: Diagnosis not present

## 2014-12-12 ENCOUNTER — Emergency Department: Payer: Medicare Other

## 2014-12-12 ENCOUNTER — Inpatient Hospital Stay
Admission: EM | Admit: 2014-12-12 | Discharge: 2014-12-17 | DRG: 871 | Disposition: A | Discharge status: Rehabilitation Facility | Payer: Medicare Other | Attending: Internal Medicine | Admitting: Internal Medicine

## 2014-12-12 ENCOUNTER — Encounter: Payer: Self-pay | Admitting: Internal Medicine

## 2014-12-12 DIAGNOSIS — I10 Essential (primary) hypertension: Secondary | ICD-10-CM | POA: Diagnosis present

## 2014-12-12 DIAGNOSIS — R339 Retention of urine, unspecified: Secondary | ICD-10-CM | POA: Diagnosis not present

## 2014-12-12 DIAGNOSIS — I5032 Chronic diastolic (congestive) heart failure: Secondary | ICD-10-CM | POA: Diagnosis not present

## 2014-12-12 DIAGNOSIS — M6259 Muscle wasting and atrophy, not elsewhere classified, multiple sites: Secondary | ICD-10-CM | POA: Diagnosis not present

## 2014-12-12 DIAGNOSIS — C7982 Secondary malignant neoplasm of genital organs: Secondary | ICD-10-CM | POA: Diagnosis present

## 2014-12-12 DIAGNOSIS — E876 Hypokalemia: Secondary | ICD-10-CM | POA: Diagnosis present

## 2014-12-12 DIAGNOSIS — Z794 Long term (current) use of insulin: Secondary | ICD-10-CM | POA: Diagnosis not present

## 2014-12-12 DIAGNOSIS — I4891 Unspecified atrial fibrillation: Secondary | ICD-10-CM | POA: Diagnosis not present

## 2014-12-12 DIAGNOSIS — K2960 Other gastritis without bleeding: Secondary | ICD-10-CM | POA: Diagnosis not present

## 2014-12-12 DIAGNOSIS — Z66 Do not resuscitate: Secondary | ICD-10-CM | POA: Diagnosis present

## 2014-12-12 DIAGNOSIS — L8992 Pressure ulcer of unspecified site, stage 2: Secondary | ICD-10-CM | POA: Diagnosis not present

## 2014-12-12 DIAGNOSIS — Z889 Allergy status to unspecified drugs, medicaments and biological substances status: Secondary | ICD-10-CM

## 2014-12-12 DIAGNOSIS — M25552 Pain in left hip: Secondary | ICD-10-CM | POA: Diagnosis not present

## 2014-12-12 DIAGNOSIS — L989 Disorder of the skin and subcutaneous tissue, unspecified: Secondary | ICD-10-CM | POA: Diagnosis not present

## 2014-12-12 DIAGNOSIS — F339 Major depressive disorder, recurrent, unspecified: Secondary | ICD-10-CM | POA: Diagnosis not present

## 2014-12-12 DIAGNOSIS — Z79818 Long term (current) use of other agents affecting estrogen receptors and estrogen levels: Secondary | ICD-10-CM | POA: Diagnosis not present

## 2014-12-12 DIAGNOSIS — R110 Nausea: Secondary | ICD-10-CM | POA: Diagnosis not present

## 2014-12-12 DIAGNOSIS — Z86711 Personal history of pulmonary embolism: Secondary | ICD-10-CM | POA: Diagnosis not present

## 2014-12-12 DIAGNOSIS — D500 Iron deficiency anemia secondary to blood loss (chronic): Secondary | ICD-10-CM | POA: Diagnosis present

## 2014-12-12 DIAGNOSIS — E780 Pure hypercholesterolemia: Secondary | ICD-10-CM | POA: Diagnosis present

## 2014-12-12 DIAGNOSIS — K921 Melena: Secondary | ICD-10-CM | POA: Diagnosis present

## 2014-12-12 DIAGNOSIS — L89622 Pressure ulcer of left heel, stage 2: Secondary | ICD-10-CM | POA: Diagnosis not present

## 2014-12-12 DIAGNOSIS — Z79899 Other long term (current) drug therapy: Secondary | ICD-10-CM | POA: Diagnosis not present

## 2014-12-12 DIAGNOSIS — M129 Arthropathy, unspecified: Secondary | ICD-10-CM | POA: Diagnosis not present

## 2014-12-12 DIAGNOSIS — K319 Disease of stomach and duodenum, unspecified: Secondary | ICD-10-CM | POA: Diagnosis not present

## 2014-12-12 DIAGNOSIS — Z7901 Long term (current) use of anticoagulants: Secondary | ICD-10-CM | POA: Diagnosis not present

## 2014-12-12 DIAGNOSIS — A4150 Gram-negative sepsis, unspecified: Principal | ICD-10-CM | POA: Diagnosis present

## 2014-12-12 DIAGNOSIS — C679 Malignant neoplasm of bladder, unspecified: Secondary | ICD-10-CM | POA: Diagnosis not present

## 2014-12-12 DIAGNOSIS — L89102 Pressure ulcer of unspecified part of back, stage 2: Secondary | ICD-10-CM | POA: Diagnosis not present

## 2014-12-12 DIAGNOSIS — C541 Malignant neoplasm of endometrium: Secondary | ICD-10-CM | POA: Diagnosis present

## 2014-12-12 DIAGNOSIS — N393 Stress incontinence (female) (male): Secondary | ICD-10-CM | POA: Diagnosis not present

## 2014-12-12 DIAGNOSIS — F328 Other depressive episodes: Secondary | ICD-10-CM | POA: Diagnosis not present

## 2014-12-12 DIAGNOSIS — L89153 Pressure ulcer of sacral region, stage 3: Secondary | ICD-10-CM | POA: Diagnosis not present

## 2014-12-12 DIAGNOSIS — R911 Solitary pulmonary nodule: Secondary | ICD-10-CM | POA: Diagnosis not present

## 2014-12-12 DIAGNOSIS — C787 Secondary malignant neoplasm of liver and intrahepatic bile duct: Secondary | ICD-10-CM | POA: Diagnosis not present

## 2014-12-12 DIAGNOSIS — K625 Hemorrhage of anus and rectum: Secondary | ICD-10-CM | POA: Diagnosis not present

## 2014-12-12 DIAGNOSIS — Z86718 Personal history of other venous thrombosis and embolism: Secondary | ICD-10-CM

## 2014-12-12 DIAGNOSIS — K920 Hematemesis: Secondary | ICD-10-CM | POA: Diagnosis not present

## 2014-12-12 DIAGNOSIS — R7309 Other abnormal glucose: Secondary | ICD-10-CM | POA: Diagnosis not present

## 2014-12-12 DIAGNOSIS — R112 Nausea with vomiting, unspecified: Secondary | ICD-10-CM | POA: Diagnosis not present

## 2014-12-12 DIAGNOSIS — N390 Urinary tract infection, site not specified: Secondary | ICD-10-CM | POA: Diagnosis present

## 2014-12-12 DIAGNOSIS — D72829 Elevated white blood cell count, unspecified: Secondary | ICD-10-CM | POA: Diagnosis not present

## 2014-12-12 DIAGNOSIS — I82409 Acute embolism and thrombosis of unspecified deep veins of unspecified lower extremity: Secondary | ICD-10-CM | POA: Diagnosis not present

## 2014-12-12 DIAGNOSIS — R1111 Vomiting without nausea: Secondary | ICD-10-CM | POA: Diagnosis not present

## 2014-12-12 DIAGNOSIS — K219 Gastro-esophageal reflux disease without esophagitis: Secondary | ICD-10-CM | POA: Diagnosis present

## 2014-12-12 DIAGNOSIS — L89312 Pressure ulcer of right buttock, stage 2: Secondary | ICD-10-CM | POA: Diagnosis not present

## 2014-12-12 DIAGNOSIS — R739 Hyperglycemia, unspecified: Secondary | ICD-10-CM

## 2014-12-12 DIAGNOSIS — D649 Anemia, unspecified: Secondary | ICD-10-CM | POA: Diagnosis not present

## 2014-12-12 DIAGNOSIS — C55 Malignant neoplasm of uterus, part unspecified: Secondary | ICD-10-CM | POA: Diagnosis not present

## 2014-12-12 DIAGNOSIS — E1165 Type 2 diabetes mellitus with hyperglycemia: Secondary | ICD-10-CM | POA: Diagnosis not present

## 2014-12-12 DIAGNOSIS — A419 Sepsis, unspecified organism: Secondary | ICD-10-CM | POA: Diagnosis not present

## 2014-12-12 DIAGNOSIS — K2970 Gastritis, unspecified, without bleeding: Secondary | ICD-10-CM | POA: Diagnosis not present

## 2014-12-12 DIAGNOSIS — L89104 Pressure ulcer of unspecified part of back, stage 4: Secondary | ICD-10-CM | POA: Diagnosis present

## 2014-12-12 DIAGNOSIS — E785 Hyperlipidemia, unspecified: Secondary | ICD-10-CM | POA: Diagnosis present

## 2014-12-12 DIAGNOSIS — R630 Anorexia: Secondary | ICD-10-CM | POA: Diagnosis not present

## 2014-12-12 DIAGNOSIS — R10817 Generalized abdominal tenderness: Secondary | ICD-10-CM | POA: Diagnosis not present

## 2014-12-12 DIAGNOSIS — K922 Gastrointestinal hemorrhage, unspecified: Secondary | ICD-10-CM | POA: Diagnosis not present

## 2014-12-12 DIAGNOSIS — E46 Unspecified protein-calorie malnutrition: Secondary | ICD-10-CM | POA: Diagnosis present

## 2014-12-12 DIAGNOSIS — L8990 Pressure ulcer of unspecified site, unspecified stage: Secondary | ICD-10-CM | POA: Diagnosis not present

## 2014-12-12 DIAGNOSIS — A4902 Methicillin resistant Staphylococcus aureus infection, unspecified site: Secondary | ICD-10-CM | POA: Diagnosis not present

## 2014-12-12 DIAGNOSIS — H81399 Other peripheral vertigo, unspecified ear: Secondary | ICD-10-CM | POA: Diagnosis not present

## 2014-12-12 DIAGNOSIS — E119 Type 2 diabetes mellitus without complications: Secondary | ICD-10-CM | POA: Diagnosis not present

## 2014-12-12 DIAGNOSIS — I739 Peripheral vascular disease, unspecified: Secondary | ICD-10-CM | POA: Diagnosis not present

## 2014-12-12 DIAGNOSIS — M6281 Muscle weakness (generalized): Secondary | ICD-10-CM | POA: Diagnosis not present

## 2014-12-12 DIAGNOSIS — N139 Obstructive and reflux uropathy, unspecified: Secondary | ICD-10-CM | POA: Diagnosis not present

## 2014-12-12 DIAGNOSIS — L89322 Pressure ulcer of left buttock, stage 2: Secondary | ICD-10-CM | POA: Diagnosis not present

## 2014-12-12 LAB — CBC WITH DIFFERENTIAL/PLATELET
Basophils Absolute: 0.1 10*3/uL (ref 0–0.1)
Basophils Relative: 0 %
Eosinophils Absolute: 0 10*3/uL (ref 0–0.7)
Eosinophils Relative: 0 %
HCT: 10 % — CL (ref 35.0–47.0)
Hemoglobin: 3 g/dL — CL (ref 12.0–16.0)
LYMPHS ABS: 3.1 10*3/uL (ref 1.0–3.6)
MCH: 25.6 pg — ABNORMAL LOW (ref 26.0–34.0)
MCHC: 29.5 g/dL — AB (ref 32.0–36.0)
MCV: 86.9 fL (ref 80.0–100.0)
Monocytes Absolute: 0.7 10*3/uL (ref 0.2–0.9)
Neutro Abs: 12.7 10*3/uL — ABNORMAL HIGH (ref 1.4–6.5)
Neutrophils Relative %: 77 %
PLATELETS: 413 10*3/uL (ref 150–440)
RBC: 1.16 MIL/uL — ABNORMAL LOW (ref 3.80–5.20)
RDW: 19.9 % — AB (ref 11.5–14.5)
WBC: 16.6 10*3/uL — AB (ref 3.6–11.0)

## 2014-12-12 LAB — URINALYSIS COMPLETE WITH MICROSCOPIC (ARMC ONLY)
BILIRUBIN URINE: NEGATIVE
Glucose, UA: >500 mg/dL — AB
NITRITE: NEGATIVE
PROTEIN: 100 mg/dL — AB
Specific Gravity, Urine: 1.024 (ref 1.005–1.030)
Squamous Epithelial / LPF: NONE SEEN
pH: 6 (ref 5.0–8.0)

## 2014-12-12 LAB — ABO/RH: ABO/RH(D): A POS

## 2014-12-12 LAB — COMPREHENSIVE METABOLIC PANEL
ALBUMIN: 2.5 g/dL — AB (ref 3.5–5.0)
ALT: 10 U/L — ABNORMAL LOW (ref 14–54)
ANION GAP: 14 (ref 5–15)
AST: 18 U/L (ref 15–41)
Alkaline Phosphatase: 43 U/L (ref 38–126)
BILIRUBIN TOTAL: 0.4 mg/dL (ref 0.3–1.2)
BUN: 38 mg/dL — AB (ref 6–20)
CALCIUM: 7.8 mg/dL — AB (ref 8.9–10.3)
CHLORIDE: 101 mmol/L (ref 101–111)
CO2: 17 mmol/L — ABNORMAL LOW (ref 22–32)
CREATININE: 1.47 mg/dL — AB (ref 0.44–1.00)
GFR calc Af Amer: 39 mL/min — ABNORMAL LOW (ref >60)
GFR calc non Af Amer: 33 mL/min — ABNORMAL LOW (ref >60)
Glucose, Bld: 894 mg/dL (ref 65–99)
Potassium: 3.9 mmol/L (ref 3.5–5.1)
Sodium: 132 mmol/L — ABNORMAL LOW (ref 135–145)
Total Protein: 6.7 g/dL (ref 6.5–8.1)

## 2014-12-12 LAB — GLUCOSE, CAPILLARY

## 2014-12-12 LAB — LIPASE, BLOOD: LIPASE: 48 U/L (ref 22–51)

## 2014-12-12 LAB — APTT: APTT: 23 s — AB (ref 24–36)

## 2014-12-12 LAB — TROPONIN I

## 2014-12-12 LAB — LACTIC ACID, PLASMA: LACTIC ACID, VENOUS: 2.9 mmol/L — AB (ref 0.5–2.0)

## 2014-12-12 LAB — PROTIME-INR
INR: 1.37
Prothrombin Time: 17.1 seconds — ABNORMAL HIGH (ref 11.4–15.0)

## 2014-12-12 MED ORDER — VANCOMYCIN HCL IN DEXTROSE 1-5 GM/200ML-% IV SOLN
INTRAVENOUS | Status: AC
  Administered 2014-12-12: 1000 mg via INTRAVENOUS
  Filled 2014-12-12: qty 200

## 2014-12-12 MED ORDER — SODIUM CHLORIDE 0.9 % IV SOLN
80.0000 mg | Freq: Once | INTRAVENOUS | Status: AC
  Administered 2014-12-12: 80 mg via INTRAVENOUS
  Filled 2014-12-12: qty 80

## 2014-12-12 MED ORDER — INSULIN ASPART 100 UNIT/ML ~~LOC~~ SOLN
10.0000 [IU] | Freq: Once | SUBCUTANEOUS | Status: AC
  Administered 2014-12-12: 10 [IU] via INTRAVENOUS

## 2014-12-12 MED ORDER — VANCOMYCIN HCL IN DEXTROSE 1-5 GM/200ML-% IV SOLN
1000.0000 mg | Freq: Once | INTRAVENOUS | Status: AC
  Administered 2014-12-12: 1000 mg via INTRAVENOUS

## 2014-12-12 MED ORDER — SODIUM CHLORIDE 0.9 % IV SOLN
8.0000 mg/h | INTRAVENOUS | Status: DC
  Administered 2014-12-12 – 2014-12-14 (×3): 8 mg/h via INTRAVENOUS
  Filled 2014-12-12 (×4): qty 80

## 2014-12-12 MED ORDER — POTASSIUM CHLORIDE 10 MEQ/100ML IV SOLN
10.0000 meq | Freq: Once | INTRAVENOUS | Status: DC
  Filled 2014-12-12: qty 100

## 2014-12-12 MED ORDER — SODIUM CHLORIDE 0.9 % IV BOLUS (SEPSIS)
1000.0000 mL | Freq: Once | INTRAVENOUS | Status: AC
  Administered 2014-12-12: 1000 mL via INTRAVENOUS

## 2014-12-12 MED ORDER — PIPERACILLIN-TAZOBACTAM 3.375 G IVPB
INTRAVENOUS | Status: AC
  Administered 2014-12-12: 3.375 g via INTRAVENOUS
  Filled 2014-12-12: qty 50

## 2014-12-12 MED ORDER — POTASSIUM CHLORIDE CRYS ER 20 MEQ PO TBCR
20.0000 meq | EXTENDED_RELEASE_TABLET | Freq: Once | ORAL | Status: AC
  Administered 2014-12-12: 20 meq via ORAL

## 2014-12-12 MED ORDER — INSULIN ASPART 100 UNIT/ML ~~LOC~~ SOLN
SUBCUTANEOUS | Status: AC
  Administered 2014-12-12: 10 [IU] via INTRAVENOUS
  Filled 2014-12-12: qty 10

## 2014-12-12 MED ORDER — POTASSIUM CHLORIDE CRYS ER 20 MEQ PO TBCR
EXTENDED_RELEASE_TABLET | ORAL | Status: AC
  Administered 2014-12-12: 20 meq via ORAL
  Filled 2014-12-12: qty 1

## 2014-12-12 MED ORDER — PIPERACILLIN-TAZOBACTAM 3.375 G IVPB
3.3750 g | Freq: Once | INTRAVENOUS | Status: AC
  Administered 2014-12-12: 3.375 g via INTRAVENOUS

## 2014-12-12 MED ORDER — INSULIN REGULAR HUMAN 100 UNIT/ML IJ SOLN
INTRAMUSCULAR | Status: DC
  Administered 2014-12-13: 8.4 [IU]/h via INTRAVENOUS
  Administered 2014-12-13: 5.4 [IU]/h via INTRAVENOUS
  Administered 2014-12-13: 2.2 [IU]/h via INTRAVENOUS
  Administered 2014-12-13: 5.7 [IU]/h via INTRAVENOUS
  Administered 2014-12-13: 3 [IU]/h via INTRAVENOUS
  Administered 2014-12-13: 10.4 [IU]/h via INTRAVENOUS
  Administered 2014-12-13: 2.3 [IU]/h via INTRAVENOUS
  Filled 2014-12-12: qty 2.5

## 2014-12-12 NOTE — ED Provider Notes (Signed)
[facility] Emergency Department Provider Note  ____________________________________________  Time seen: Approximately 7:19 PM  I have reviewed the triage vital signs and the nursing notes.   HISTORY  Chief Complaint Emesis    HPI Shelley Fields is a 79 y.o. female with history of history of diabetes, metastatic uterine cancer not currently undergoing treatment, hypertension, hyperlipidemia presents for evaluation of multiple episodes of vomiting today, gradual onset, intermittent. Patient reports "it looks like blood". She is unsure whether not she has had blood in her stool "because I can't see down there". She reports she last had a bowel movement today. She has no abdominal pain. She has had no fevers. She denies chest pain or difficulty breathing. Current severity is moderate to severe. No modifying factors. Per EMS, her glucose read as "high" on their monitor.   Past Medical History  Diagnosis Date  . Diabetes mellitus without complication   . C. difficile diarrhea   . Uterine cancer   . Hypogammaglobulinemia   . Pulmonary embolism   . Decubitus ulcers   . Hypertension   . Hypercholesterolemia   . Incontinence of urine   . Cancer     Endometrial Cancer  . Cervical cancer     with radiation 45 years ago...  . Arthritis   . GERD (gastroesophageal reflux disease)   . Bladder cancer   . Decubitus ulcer of buttock, stage 1     Patient Active Problem List   Diagnosis Date Noted  . Anorexia 11/23/2014  . Encounter for other specified administrative purpose 07/10/2013  . Referral of patient 07/10/2013  . Candidal vulvovaginitis 10/14/2012  . Candida vaginitis 10/14/2012  . Polypharmacy 06/23/2012  . Long term current use of opiate analgesic 06/23/2012  . Breast lump 06/10/2012  . Laboratory examination 04/12/2012  . Diagnostic skin and sensitization tests 04/12/2012  . Bladder infection, chronic 03/23/2012  . Total urinary incontinence  03/23/2012  . 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 Female genuine stress incontinence 03/23/2012  . Disorder of bladder function 03/23/2012  . Incomplete bladder emptying 03/23/2012  . 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 Cystitis, radiation 03/23/2012  . Cancer of body of uterus 03/23/2012  . Cancer of corpus uteri, except isthmus 03/23/2012  . Bladder neoplasm of uncertain malignant potential 03/23/2012  . Infection of urinary tract 03/23/2012  . Colon polyp 01/06/2012  . Difficulty in walking 01/06/2012  . Diabetes 01/06/2012  . Deep vein thrombosis 01/06/2012  . Cancer of endometrium 01/06/2012  . Contact with and exposure to tuberculosis 01/06/2012  . BP (high blood pressure) 01/06/2012  . Angiopathy, peripheral 01/06/2012  . Abnormal presence of protein in urine 01/06/2012  . Retinopathy 01/06/2012    Past Surgical History  Procedure Laterality Date  . Total abdominal hysterectomy w/ bilateral salpingoophorectomy  07/29/2010  . Stent in leg  2016  . Cataract extraction, bilateral    . Bladder surgery    . Femoral endarterectomy      right  . Leg surgery      right leg  . Portacath placement      right    Current Outpatient Rx  Name  Route  Sig  Dispense  Refill  . amLODipine (NORVASC) 5 MG tablet      TK 1 T PO QD      0   . atenolol (TENORMIN) 50 MG tablet   Oral   Take 50 mg by mouth daily.         . benazepril (  LOTENSIN) 40 MG tablet   Oral   Take 40 mg by mouth daily.         . cephALEXin (KEFLEX) 500 MG capsule   Oral   Take 1 capsule (500 mg total) by mouth 4 (four) times daily.   28 capsule   0   . cloNIDine (CATAPRES) 0.2 MG tablet   Oral   Take 0.2 mg by mouth 2 (two) times daily.         . furosemide (LASIX) 20 MG tablet   Oral   Take 20 mg by mouth every other day.         Marland Kitchen HUMULIN 70/30 (70-30) 100 UNIT/ML injection      16 Units daily at 6 PM.           Dispense as written.   . hydrALAZINE (APRESOLINE) 100 MG  tablet   Oral   Take 100 mg by mouth 2 (two) times daily.         Marland Kitchen lovastatin (MEVACOR) 20 MG tablet   Oral   Take 20 mg by mouth daily.         . meclizine (ANTIVERT) 25 MG tablet   Oral   Take 25 mg by mouth daily.         . megestrol (MEGACE ES) 625 MG/5ML suspension   Oral   Take 1.6 mLs (200 mg total) by mouth daily. to stimulate appetite   150 mL   0   . mirtazapine (REMERON) 15 MG tablet   Oral   Take 15 mg by mouth daily.         Marland Kitchen NOVOLOG FLEXPEN 100 UNIT/ML FlexPen      Per Sliding Scale...           Dispense as written.   . nystatin (MYCOSTATIN) powder               . omeprazole (PRILOSEC) 20 MG capsule   Oral   Take 20 mg by mouth daily.      12   . ONE TOUCH ULTRA TEST test strip                 Dispense as written.   . potassium chloride (K-DUR) 10 MEQ tablet   Oral   Take 10 mEq by mouth every other day.         . promethazine (PHENERGAN) 12.5 MG tablet   Oral   Take 12.5 mg by mouth every 6 (six) hours as needed.         Marland Kitchen SANTYL ointment                 Dispense as written.   . tamsulosin (FLOMAX) 0.4 MG CAPS capsule   Oral   Take 0.4 mg by mouth 2 (two) times daily.         Marland Kitchen warfarin (COUMADIN) 1 MG tablet   Oral   Take 0.5 mg by mouth daily.           Allergies Contrast media  No family history on file.  Social History History  Substance Use Topics  . Smoking status: Never Smoker   . Smokeless tobacco: Not on file  . Alcohol Use: Not on file    Review of Systems Constitutional: No fever/chills Eyes: No visual changes. ENT: No sore throat. Cardiovascular: Denies chest pain. Respiratory: Denies shortness of breath. Gastrointestinal: No abdominal pain.  + nausea, + vomiting.  No diarrhea.  No constipation. Genitourinary: Negative for  dysuria. Musculoskeletal: Negative for back pain. Skin: Negative for rash. Neurological: Negative for headaches, focal weakness or numbness.  10-point  ROS otherwise negative.  ____________________________________________   PHYSICAL EXAM:  Filed Vitals:   12/12/14 1922  BP: 152/91  Pulse: 100  Temp: 98.4 F (36.9 C)  Resp: 22   Filed Vitals:   12/12/14 1922  BP: 152/91  Pulse: 100  Temp: 98.4 F (36.9 C)  Resp: 22  Height: 5' (1.524 m)  Weight: 150 lb (68.04 kg)  SpO2: 99%    Constitutional: Alert and oriented. Chronically ill-appearing and in no acute distress. Eyes: Conjunctivae are normal. PERRL. EOMI. Head: Atraumatic. Nose: No congestion/rhinnorhea. Mouth/Throat: Mucous membranes are moist.  Oropharynx non-erythematous. Neck: No stridor.  Cardiovascular: Normal rate, regular rhythm. Grossly normal heart sounds.  Good peripheral circulation. Respiratory: Normal respiratory effort.  No retractions. Lungs CTAB. Gastrointestinal: Soft and nontender. No distention. No abdominal bruits. No CVA tenderness. Genitourinary: + chronic indwelling foley catheter in place Rectal: areas of maroon blood in diaper and stool is guaiac positive. Musculoskeletal: No lower extremity tenderness nor edema.  No joint effusions. Neurologic:  Normal speech and language. No gross focal neurologic deficits are appreciated. Speech is normal.  Skin:  Skin is warm, dry. Healing sacral decubitus ulcer, healing decubitus ulcer to the left heel without evidence of superinfection. Psychiatric: Mood and affect are normal. Speech and behavior are normal.  ____________________________________________   LABS (all labs ordered are listed, but only abnormal results are displayed)  Labs Reviewed  CBC WITH DIFFERENTIAL/PLATELET - Abnormal; Notable for the following:    WBC 16.6 (*)    RBC 1.16 (*)    Hemoglobin 3.0 (*)    HCT 10.0 (*)    MCH 25.6 (*)    MCHC 29.5 (*)    RDW 19.9 (*)    Neutro Abs 12.7 (*)    All other components within normal limits  COMPREHENSIVE METABOLIC PANEL - Abnormal; Notable for the following:    Sodium 132 (*)    CO2  17 (*)    Glucose, Bld 894 (*)    BUN 38 (*)    Creatinine, Ser 1.47 (*)    Calcium 7.8 (*)    Albumin 2.5 (*)    ALT 10 (*)    GFR calc non Af Amer 33 (*)    GFR calc Af Amer 39 (*)    All other components within normal limits  URINALYSIS COMPLETEWITH MICROSCOPIC ([facility] ONLY) - Abnormal; Notable for the following:    Color, Urine YELLOW (*)    APPearance CLOUDY (*)    Glucose, UA >500 (*)    Ketones, ur TRACE (*)    Hgb urine dipstick 2+ (*)    Protein, ur 100 (*)    Leukocytes, UA 3+ (*)    Bacteria, UA RARE (*)    All other components within normal limits  LACTIC ACID, PLASMA - Abnormal; Notable for the following:    Lactic Acid, Venous 2.9 (*)    All other components within normal limits  GLUCOSE, CAPILLARY - Abnormal; Notable for the following:    Glucose-Capillary >600 (*)    All other components within normal limits  APTT - Abnormal; Notable for the following:    aPTT 23 (*)    All other components within normal limits  PROTIME-INR - Abnormal; Notable for the following:    Prothrombin Time 17.1 (*)    All other components within normal limits  CULTURE, BLOOD (ROUTINE X 2)  CULTURE, BLOOD (  ROUTINE X 2)  TROPONIN I  LIPASE, BLOOD  LACTIC ACID, PLASMA  TYPE AND SCREEN  ABO/RH  PREPARE RBC (CROSSMATCH)   ____________________________________________  EKG  ED ECG REPORT I, Joanne Gavel, the attending physician, personally viewed and interpreted this ECG.   Date: 12/12/2014  EKG Time: 19:19  Rate: 92  Rhythm: sinus rhythm with PACs  Axis: normal  Intervals:none  ST&T Change: No acute ST segment elevation, nonspecific ST and T-wave abnormality  ____________________________________________  RADIOLOGY  CXR  IMPRESSION: No acute disease.  No change in a 1.4 cm in pulmonary nodule on the right.   ____________________________________________   PROCEDURES  Procedure(s) performed: None  Critical Care performed: Yes, see critical care note(s). Total  critical care time spent 45 minutes.  ____________________________________________   INITIAL IMPRESSION / ASSESSMENT AND PLAN / ED COURSE  Pertinent labs & imaging results that were available during my care of the patient were reviewed by me and considered in my medical decision making (see chart for details).  Shelley Fields is a 79 y.o. female with history of history of diabetes, metastatic uterine cancer not currently undergoing treatment, hypertension, hyperlipidemia presents for evaluation of multiple episodes of vomiting today. On exam, she is chronically ill-appearing in NAD. Borderling tachycardic. Has benign abdominal exam but she does have areas of blood in her diaper concerning for GI bleed. Plan for screening labs, chest x-ray, urinalysis, IV fluids and insulin as needed given that her glucose is too high to read on point-of-care glucometer. Anticipate admission.  ----------------------------------------- 10:17 PM on 12/12/2014 -----------------------------------------  Labs notable for hemoglobin 3.0, hematocrit 10 concerning for upper GI bleed. The patient has been consented for transfusion, will  transfuse 2 units of PRBCs. IV Protonix bolus and drip have been ordered. Additionally, glucose is 894 and she does have low bicarbonate anion gap is not wide. She has strong history of type 2 diabetes without prior presentations with DKA so suspect severe hyperglycemia without DKA at this point over given how high her glucose is, we will start insulin drip, replete potassium. Liberal IV fluids, IV vancomycin and Zosyn were given on her arrival for possible sepsis. Urinalysis with multiple red and white blood cells as well as 3+ leuk esterase but she does have an indwelling Foley catheter and this may represent contamination. We'll send urine culture. Disucssed with Dr. Jannifer Franklin, hospitalist, for admission. ____________________________________________   FINAL CLINICAL IMPRESSION(S) / ED  DIAGNOSES  Final diagnoses:  Acute upper GI bleed  Acute hyperglycemia  Sepsis, due to unspecified organism      Joanne Gavel, MD 12/12/14 2227

## 2014-12-12 NOTE — ED Notes (Signed)
Pt in from home by ems for co vomiting since today, hx of stage 4 uterine cancer.  Pt with decub to buttocks and feet per EMS.  FSBS high en route.

## 2014-12-13 ENCOUNTER — Encounter: Payer: Self-pay | Admitting: Gastroenterology

## 2014-12-13 DIAGNOSIS — M25552 Pain in left hip: Secondary | ICD-10-CM | POA: Diagnosis not present

## 2014-12-13 DIAGNOSIS — L89322 Pressure ulcer of left buttock, stage 2: Secondary | ICD-10-CM | POA: Diagnosis not present

## 2014-12-13 DIAGNOSIS — C787 Secondary malignant neoplasm of liver and intrahepatic bile duct: Secondary | ICD-10-CM

## 2014-12-13 DIAGNOSIS — Z8619 Personal history of other infectious and parasitic diseases: Secondary | ICD-10-CM

## 2014-12-13 DIAGNOSIS — N393 Stress incontinence (female) (male): Secondary | ICD-10-CM | POA: Diagnosis not present

## 2014-12-13 DIAGNOSIS — N390 Urinary tract infection, site not specified: Secondary | ICD-10-CM

## 2014-12-13 DIAGNOSIS — E119 Type 2 diabetes mellitus without complications: Secondary | ICD-10-CM | POA: Diagnosis not present

## 2014-12-13 DIAGNOSIS — N289 Disorder of kidney and ureter, unspecified: Secondary | ICD-10-CM

## 2014-12-13 DIAGNOSIS — B379 Candidiasis, unspecified: Secondary | ICD-10-CM

## 2014-12-13 DIAGNOSIS — L89159 Pressure ulcer of sacral region, unspecified stage: Secondary | ICD-10-CM

## 2014-12-13 DIAGNOSIS — I739 Peripheral vascular disease, unspecified: Secondary | ICD-10-CM | POA: Diagnosis not present

## 2014-12-13 DIAGNOSIS — Z86711 Personal history of pulmonary embolism: Secondary | ICD-10-CM

## 2014-12-13 DIAGNOSIS — L989 Disorder of the skin and subcutaneous tissue, unspecified: Secondary | ICD-10-CM | POA: Diagnosis not present

## 2014-12-13 DIAGNOSIS — L89312 Pressure ulcer of right buttock, stage 2: Secondary | ICD-10-CM | POA: Diagnosis not present

## 2014-12-13 DIAGNOSIS — D649 Anemia, unspecified: Secondary | ICD-10-CM | POA: Diagnosis not present

## 2014-12-13 DIAGNOSIS — I5032 Chronic diastolic (congestive) heart failure: Secondary | ICD-10-CM | POA: Diagnosis not present

## 2014-12-13 DIAGNOSIS — C772 Secondary and unspecified malignant neoplasm of intra-abdominal lymph nodes: Secondary | ICD-10-CM

## 2014-12-13 DIAGNOSIS — L89622 Pressure ulcer of left heel, stage 2: Secondary | ICD-10-CM | POA: Diagnosis not present

## 2014-12-13 DIAGNOSIS — K922 Gastrointestinal hemorrhage, unspecified: Secondary | ICD-10-CM

## 2014-12-13 DIAGNOSIS — F328 Other depressive episodes: Secondary | ICD-10-CM | POA: Diagnosis not present

## 2014-12-13 DIAGNOSIS — I4891 Unspecified atrial fibrillation: Secondary | ICD-10-CM | POA: Diagnosis not present

## 2014-12-13 DIAGNOSIS — C541 Malignant neoplasm of endometrium: Secondary | ICD-10-CM

## 2014-12-13 DIAGNOSIS — I10 Essential (primary) hypertension: Secondary | ICD-10-CM | POA: Diagnosis not present

## 2014-12-13 LAB — CBC WITH DIFFERENTIAL/PLATELET
Basophils Absolute: 0.1 10*3/uL (ref 0–0.1)
Basophils Relative: 1 %
EOS ABS: 0.3 10*3/uL (ref 0–0.7)
Eosinophils Relative: 3 %
HEMATOCRIT: 36.6 % (ref 35.0–47.0)
HEMOGLOBIN: 12.1 g/dL (ref 12.0–16.0)
LYMPHS PCT: 29 %
Lymphs Abs: 2.9 10*3/uL (ref 1.0–3.6)
MCH: 27.2 pg (ref 26.0–34.0)
MCHC: 32.9 g/dL (ref 32.0–36.0)
MCV: 82.6 fL (ref 80.0–100.0)
MONOS PCT: 7 %
Monocytes Absolute: 0.7 10*3/uL (ref 0.2–0.9)
Neutro Abs: 6.1 10*3/uL (ref 1.4–6.5)
Neutrophils Relative %: 60 %
Platelets: 285 10*3/uL (ref 150–440)
RBC: 4.44 MIL/uL (ref 3.80–5.20)
RDW: 17.3 % — AB (ref 11.5–14.5)
WBC: 10 10*3/uL (ref 3.6–11.0)

## 2014-12-13 LAB — BASIC METABOLIC PANEL
ANION GAP: 7 (ref 5–15)
Anion gap: 6 (ref 5–15)
BUN: 26 mg/dL — ABNORMAL HIGH (ref 6–20)
BUN: 22 mg/dL — AB (ref 6–20)
CHLORIDE: 115 mmol/L — AB (ref 101–111)
CO2: 21 mmol/L — ABNORMAL LOW (ref 22–32)
CO2: 20 mmol/L — AB (ref 22–32)
CREATININE: 1.15 mg/dL — AB (ref 0.44–1.00)
Calcium: 8.2 mg/dL — ABNORMAL LOW (ref 8.9–10.3)
Calcium: 7.7 mg/dL — ABNORMAL LOW (ref 8.9–10.3)
Chloride: 114 mmol/L — ABNORMAL HIGH (ref 101–111)
Creatinine, Ser: 1.08 mg/dL — ABNORMAL HIGH (ref 0.44–1.00)
GFR calc Af Amer: 52 mL/min — ABNORMAL LOW (ref >60)
GFR calc non Af Amer: 48 mL/min — ABNORMAL LOW (ref >60)
GFR calc non Af Amer: 45 mL/min — ABNORMAL LOW (ref >60)
GFR, EST AFRICAN AMERICAN: 56 mL/min — AB (ref >60)
Glucose, Bld: 160 mg/dL — ABNORMAL HIGH (ref 65–99)
Glucose, Bld: 127 mg/dL — ABNORMAL HIGH (ref 65–99)
POTASSIUM: 3.5 mmol/L (ref 3.5–5.1)
Potassium: 3.5 mmol/L (ref 3.5–5.1)
Sodium: 142 mmol/L (ref 135–145)
Sodium: 141 mmol/L (ref 135–145)

## 2014-12-13 LAB — CBC
HEMATOCRIT: 36 % (ref 35.0–47.0)
Hemoglobin: 11.8 g/dL — ABNORMAL LOW (ref 12.0–16.0)
MCH: 27 pg (ref 26.0–34.0)
MCHC: 32.6 g/dL (ref 32.0–36.0)
MCV: 82.7 fL (ref 80.0–100.0)
PLATELETS: 285 10*3/uL (ref 150–440)
RBC: 4.36 MIL/uL (ref 3.80–5.20)
RDW: 16.7 % — AB (ref 11.5–14.5)
WBC: 11.4 10*3/uL — ABNORMAL HIGH (ref 3.6–11.0)

## 2014-12-13 LAB — GLUCOSE, CAPILLARY
GLUCOSE-CAPILLARY: 249 mg/dL — AB (ref 65–99)
GLUCOSE-CAPILLARY: 171 mg/dL — AB (ref 65–99)
GLUCOSE-CAPILLARY: 143 mg/dL — AB (ref 65–99)
GLUCOSE-CAPILLARY: 141 mg/dL — AB (ref 65–99)
GLUCOSE-CAPILLARY: 133 mg/dL — AB (ref 65–99)
GLUCOSE-CAPILLARY: 124 mg/dL — AB (ref 65–99)
Glucose-Capillary: 578 mg/dL (ref 65–99)
Glucose-Capillary: 478 mg/dL — ABNORMAL HIGH (ref 65–99)
Glucose-Capillary: 345 mg/dL — ABNORMAL HIGH (ref 65–99)
Glucose-Capillary: 212 mg/dL — ABNORMAL HIGH (ref 65–99)
Glucose-Capillary: 174 mg/dL — ABNORMAL HIGH (ref 65–99)
Glucose-Capillary: 167 mg/dL — ABNORMAL HIGH (ref 65–99)
Glucose-Capillary: 121 mg/dL — ABNORMAL HIGH (ref 65–99)
Glucose-Capillary: 159 mg/dL — ABNORMAL HIGH (ref 65–99)
Glucose-Capillary: 110 mg/dL — ABNORMAL HIGH (ref 65–99)
Glucose-Capillary: 124 mg/dL — ABNORMAL HIGH (ref 65–99)

## 2014-12-13 LAB — MRSA PCR SCREENING: MRSA by PCR: POSITIVE — AB

## 2014-12-13 LAB — PREPARE RBC (CROSSMATCH)

## 2014-12-13 LAB — LACTIC ACID, PLASMA: LACTIC ACID, VENOUS: 2 mmol/L (ref 0.5–2.0)

## 2014-12-13 MED ORDER — INSULIN GLARGINE 100 UNIT/ML ~~LOC~~ SOLN
10.0000 [IU] | SUBCUTANEOUS | Status: DC
  Administered 2014-12-13: 10 [IU] via SUBCUTANEOUS
  Filled 2014-12-13 (×3): qty 0.1

## 2014-12-13 MED ORDER — ATENOLOL 50 MG PO TABS
50.0000 mg | ORAL_TABLET | Freq: Every day | ORAL | Status: DC
  Administered 2014-12-13 – 2014-12-17 (×5): 50 mg via ORAL
  Filled 2014-12-13 (×5): qty 1

## 2014-12-13 MED ORDER — CLONIDINE HCL 0.1 MG PO TABS
0.2000 mg | ORAL_TABLET | Freq: Two times a day (BID) | ORAL | Status: DC
  Administered 2014-12-13 – 2014-12-17 (×7): 0.2 mg via ORAL
  Filled 2014-12-13 (×6): qty 2
  Filled 2014-12-13: qty 1
  Filled 2014-12-13: qty 2

## 2014-12-13 MED ORDER — INSULIN ASPART 100 UNIT/ML ~~LOC~~ SOLN
2.0000 [IU] | SUBCUTANEOUS | Status: DC
  Administered 2014-12-13: 100 [IU] via SUBCUTANEOUS
  Administered 2014-12-13: 4 [IU] via SUBCUTANEOUS
  Administered 2014-12-13: 2 [IU] via SUBCUTANEOUS
  Administered 2014-12-14: 4 [IU] via SUBCUTANEOUS
  Administered 2014-12-14 (×2): 2 [IU] via SUBCUTANEOUS
  Filled 2014-12-13: qty 2
  Filled 2014-12-13: qty 4
  Filled 2014-12-13 (×3): qty 2
  Filled 2014-12-13: qty 4

## 2014-12-13 MED ORDER — BENAZEPRIL HCL 10 MG PO TABS
40.0000 mg | ORAL_TABLET | Freq: Every day | ORAL | Status: DC
  Administered 2014-12-13 – 2014-12-17 (×4): 40 mg via ORAL
  Filled 2014-12-13: qty 4
  Filled 2014-12-13: qty 2
  Filled 2014-12-13 (×3): qty 4

## 2014-12-13 MED ORDER — SODIUM CHLORIDE 0.9 % IV SOLN
INTRAVENOUS | Status: DC
  Administered 2014-12-14: 14:00:00 via INTRAVENOUS

## 2014-12-13 MED ORDER — SODIUM CHLORIDE 0.9 % IV SOLN
INTRAVENOUS | Status: AC
Start: 2014-12-13 — End: 2014-12-13
  Administered 2014-12-13: 07:00:00 via INTRAVENOUS

## 2014-12-13 MED ORDER — MIRTAZAPINE 15 MG PO TABS
15.0000 mg | ORAL_TABLET | Freq: Every day | ORAL | Status: DC
  Administered 2014-12-13 – 2014-12-17 (×5): 15 mg via ORAL
  Filled 2014-12-13 (×5): qty 1

## 2014-12-13 MED ORDER — PIPERACILLIN-TAZOBACTAM 3.375 G IVPB
3.3750 g | Freq: Three times a day (TID) | INTRAVENOUS | Status: DC
  Administered 2014-12-13 – 2014-12-15 (×6): 3.375 g via INTRAVENOUS
  Filled 2014-12-13 (×12): qty 50

## 2014-12-13 MED ORDER — VANCOMYCIN HCL IN DEXTROSE 750-5 MG/150ML-% IV SOLN
750.0000 mg | INTRAVENOUS | Status: DC
  Administered 2014-12-13 – 2014-12-14 (×2): 750 mg via INTRAVENOUS
  Filled 2014-12-13 (×3): qty 150

## 2014-12-13 MED ORDER — CHLORHEXIDINE GLUCONATE CLOTH 2 % EX PADS
6.0000 | MEDICATED_PAD | Freq: Every day | CUTANEOUS | Status: DC
  Administered 2014-12-13 – 2014-12-16 (×4): 6 via TOPICAL

## 2014-12-13 MED ORDER — COLLAGENASE 250 UNIT/GM EX OINT
TOPICAL_OINTMENT | Freq: Every day | CUTANEOUS | Status: DC
  Administered 2014-12-13 – 2014-12-16 (×4): via TOPICAL
  Filled 2014-12-13: qty 30

## 2014-12-13 MED ORDER — DEXTROSE 10 % IV SOLN: INTRAVENOUS | Status: DC | PRN

## 2014-12-13 MED ORDER — AMLODIPINE BESYLATE 5 MG PO TABS
5.0000 mg | ORAL_TABLET | Freq: Every day | ORAL | Status: DC
  Administered 2014-12-13 – 2014-12-17 (×5): 5 mg via ORAL
  Filled 2014-12-13 (×5): qty 1

## 2014-12-13 MED ORDER — TAMSULOSIN HCL 0.4 MG PO CAPS
0.4000 mg | ORAL_CAPSULE | Freq: Two times a day (BID) | ORAL | Status: DC
  Administered 2014-12-13 – 2014-12-17 (×9): 0.4 mg via ORAL
  Filled 2014-12-13 (×10): qty 1

## 2014-12-13 MED ORDER — MUPIROCIN 2 % EX OINT
1.0000 "application " | TOPICAL_OINTMENT | Freq: Two times a day (BID) | CUTANEOUS | Status: DC
  Administered 2014-12-13 – 2014-12-17 (×9): 1 via NASAL
  Filled 2014-12-13: qty 22

## 2014-12-13 MED ORDER — HYDRALAZINE HCL 50 MG PO TABS
100.0000 mg | ORAL_TABLET | Freq: Two times a day (BID) | ORAL | Status: DC
  Administered 2014-12-13 – 2014-12-17 (×8): 100 mg via ORAL
  Filled 2014-12-13 (×11): qty 2

## 2014-12-13 MED ORDER — PRAVASTATIN SODIUM 10 MG PO TABS
10.0000 mg | ORAL_TABLET | Freq: Every day | ORAL | Status: DC
  Administered 2014-12-13 – 2014-12-17 (×4): 10 mg via ORAL
  Filled 2014-12-13 (×4): qty 1

## 2014-12-13 MED ORDER — FUROSEMIDE 20 MG PO TABS
20.0000 mg | ORAL_TABLET | ORAL | Status: DC
  Administered 2014-12-13 – 2014-12-17 (×3): 20 mg via ORAL
  Filled 2014-12-13 (×3): qty 1

## 2014-12-13 NOTE — Consult Note (Signed)
Note was entered in error.  Please disregard and see Consult Note from Dr. Candace Cruise dated 12/13/2014.

## 2014-12-13 NOTE — Progress Notes (Signed)
Initial Nutrition Assessment   INTERVENTION:   Medical Food Supplement: recommend addition of EnsureEnlive once diet advanced, pt likes Strawberry  NUTRITION DIAGNOSIS:  Inadequate oral intake related to altered GI function as evidenced by NPO status.  GOAL:  Patient will meet greater than or equal to 90% of their needs  MONITOR:   (Energy Intake, Anthropometrics, Digestive System, Electrolyte/Renal Profile, Glucose Profile)  REASON FOR ASSESSMENT:  Malnutrition Screening Tool    ASSESSMENT:  Pt admitted with severe anemia due to GI bleed, +dark brown emesis. Pt with metastatic uterine cancer; noted palliative care consult  PMHx:  Past Medical History  Diagnosis Date  . Diabetes mellitus without complication   . C. difficile diarrhea   . Uterine cancer   . Hypogammaglobulinemia   . Pulmonary embolism   . Decubitus ulcers   . Hypertension   . Hypercholesterolemia   . Incontinence of urine   . Cancer     Endometrial Cancer  . Cervical cancer     with radiation 45 years ago...  . Arthritis   . GERD (gastroesophageal reflux disease)   . Bladder cancer   . Decubitus ulcer of buttock, stage 1     Diet Order: NPO   Current Nutrition: NPO  Food/Nutrition-Related History: pt unsure about her appetite prior to admission  Medications: lasix, aspart, lantus, remeron,   Electrolyte/Renal Profile and Glucose Profile:   Recent Labs Lab 12/12/14 1955 12/13/14 0740  NA 132* 142  K 3.9 3.5  CL 101 114*  CO2 17* 21*  BUN 38* 26*  CREATININE 1.47* 1.08*  CALCIUM 7.8* 8.2*  GLUCOSE 894* 160*   Protein Profile:   Recent Labs Lab 12/12/14 1955  ALBUMIN 2.5*    Gastrointestinal Profile: Last BM: 7/6   Nutrition-Focused Physical Exam Findings: Nutrition-Focused physical exam completed. Findings are no fat depletion,  Mild/moderate muscle depletion, and no edema.    Weight Change: pt does not know if she has had any changes in weight recently, does not  know UBW Anthropometrics:   Height:  Ht Readings from Last 1 Encounters:  12/13/14 5' (1.524 m)    Weight:  Wt Readings from Last 1 Encounters:  12/13/14 152 lb 12.5 oz (69.3 kg)    Ideal Body Weight:     Wt Readings from Last 10 Encounters:  12/13/14 152 lb 12.5 oz (69.3 kg)  11/30/14 156 lb 6.7 oz (70.95 kg)  11/23/14 151 lb 14.4 oz (68.9 kg)    BMI:  Body mass index is 29.84 kg/(m^2).  Estimated Nutritional Needs:  Kcal:  1451-1716 kcals (BEE 1100, 1.2 AF, 1.1-1.3 IF) using current wt of 69 kg  Protein:  83-104 g (1.2-1.5 g/kg)  using current wt of 69 kg  Fluid:  1725-2070 mL (25-30 ml/kg)   Skin:   multiple stage II/III pressure ulcers  Diet Order:  Diet NPO time specified Except for: Sips with [place] MS, RD, LDN [phone] Pager

## 2014-12-13 NOTE — Progress Notes (Signed)
Patient vitals stable. SA with PVCs on monitor.  Hgb stable- no signs of bleeding.  Pt off insulin drip at this time- Plan of EGD tomorrow- Per Dr. Candace Cruise.

## 2014-12-13 NOTE — Progress Notes (Signed)
[facility]                                                                                                                                                                                            Patient Demographics   Shelley Fields, is a 79 y.o. female, DOB - 06/10/1935, MEQ:[id number]  Admit date - 12/12/2014   Admitting Physician Juluis Mire, MD  Outpatient Primary MD for the patient is Shelley Doe, MD   LOS - 1  Subjective: Patient admitted with upper GI bleeding, denies any further leading. Denies any abdominal pain. Hemoglobin was around 3 on admission and now after this transfusion of 2 packs a set 11. Denies any chest pain patient also noticed to have a urinary tract infection     Review of Systems:   CONSTITUTIONAL: No documented fever. No fatigue, weakness. No weight gain, no weight loss.  EYES: No blurry or double vision.  ENT: No tinnitus. No postnasal drip. No redness of the oropharynx.  RESPIRATORY: No cough, no wheeze, no hemoptysis. No dyspnea.  CARDIOVASCULAR: No chest pain. No orthopnea. No palpitations. No syncope.  GASTROINTESTINAL positive nausea, positive vomiting or diarrhea. No abdominal pain. No melena or hematochezia.  GENITOURINARY: No dysuria or hematuria.  ENDOCRINE: No polyuria or nocturia. No heat or cold intolerance.  HEMATOLOGY: Positive anemia. No bruising. No bleeding.  INTEGUMENTARY: No rashes. No lesions.  MUSCULOSKELETAL: No arthritis. No swelling. No gout.  NEUROLOGIC: No numbness, tingling, or ataxia. No seizure-type activity.  PSYCHIATRIC: No anxiety. No insomnia. No ADD.    Vitals:   Filed Vitals:   12/13/14 0700 12/13/14 0800 12/13/14 0900 12/13/14 1000  BP: 166/51 146/87 106/85 141/99  Pulse: 79 81 78 84  Temp: 98.9 F (37.2 C)     TempSrc: Oral     Resp: 16 20 18 23   Height:      Weight:      SpO2: 99% 100% 98% 99%    Wt Readings from Last 3 Encounters:  12/13/14  69.3 kg (152 lb 12.5 oz)  11/30/14 70.95 kg (156 lb 6.7 oz)  11/23/14 68.9 kg (151 lb 14.4 oz)     Intake/Output Summary (Last 24 hours) at 12/13/14 1210 Last data filed at 12/13/14 1158  Gross per 24 hour  Intake 1238.19 ml  Output    120 ml  Net 1118.19 ml    Physical Exam:   GENERAL: Pleasant-appearing in no apparent distress.  HEAD, EYES, EARS, NOSE AND THROAT: Atraumatic, normocephalic. Extraocular muscles are intact. Pupils equal and reactive to light. Sclerae anicteric. No conjunctival injection. No  oro-pharyngeal erythema.  NECK: Supple. There is no jugular venous distention. No bruits, no lymphadenopathy, no thyromegaly.  HEART: Regular rate and rhythm, tachycardic. No murmurs, no rubs, no clicks.  LUNGS: Clear to auscultation bilaterally. No rales or rhonchi. No wheezes.  ABDOMEN: Soft, flat, nontender, nondistended. Has good bowel sounds. No hepatosplenomegaly appreciated.  EXTREMITIES: No evidence of any cyanosis, clubbing, or peripheral edema.  +2 pedal and radial pulses bilaterally.  NEUROLOGIC: The patient is alert, awake, and oriented x3 with no focal motor or sensory deficits appreciated bilaterally.  SKIN: Moist and warm with no rashes appreciated.  Psych: Not anxious, depressed LN: No inguinal LN enlargement    Antibiotics   Anti-infectives    Start     Dose/Rate Route Frequency Ordered Stop   12/13/14 0500  piperacillin-tazobactam (ZOSYN) IVPB 3.375 g     3.375 g 12.5 mL/hr over 240 Minutes Intravenous 3 times per day 12/13/14 0104     12/13/14 0330  vancomycin (VANCOCIN) IVPB 750 mg/150 ml premix     750 mg 150 mL/hr over 60 Minutes Intravenous Every 24 hours 12/13/14 0104     12/12/14 2100  vancomycin (VANCOCIN) IVPB 1000 mg/200 mL premix     1,000 mg 200 mL/hr over 60 Minutes Intravenous  Once 12/12/14 2051 12/12/14 2248   12/12/14 2100  piperacillin-tazobactam (ZOSYN) IVPB 3.375 g     3.375 g 12.5 mL/hr over 240 Minutes Intravenous  Once 12/12/14  2051 12/12/14 2131      Medications   Scheduled Meds: . amLODipine  5 mg Oral Daily  . atenolol  50 mg Oral Daily  . benazepril  40 mg Oral Daily  . Chlorhexidine Gluconate Cloth  6 each Topical Q0600  . cloNIDine  0.2 mg Oral BID  . collagenase   Topical Daily  . furosemide  20 mg Oral QODAY  . hydrALAZINE  100 mg Oral BID  . insulin aspart  2-6 Units Subcutaneous 6 times per day  . insulin glargine  10 Units Subcutaneous Q24H  . mirtazapine  15 mg Oral Daily  . mupirocin ointment  1 application Nasal BID  . piperacillin-tazobactam (ZOSYN)  IV  3.375 g Intravenous 3 times per day  . potassium chloride  10 mEq Intravenous Once  . pravastatin  10 mg Oral q1800  . tamsulosin  0.4 mg Oral BID  . vancomycin  750 mg Intravenous Q24H   Continuous Infusions: . sodium chloride 100 mL/hr at 12/13/14 0634  . insulin (NOVOLIN-R) infusion 1 mL/hr at 12/13/14 1158  . pantoprozole (PROTONIX) infusion 8 mg/hr (12/13/14 1159)   PRN Meds:.   Data Review:   Micro Results Recent Results (from the past 240 hour(s))  Blood culture (routine x 2)     Status: None (Preliminary result)   Collection Time: 12/12/14  7:35 PM  Result Value Ref Range Status   Specimen Description BLOOD  Final   Special Requests Normal  Final   Culture NO GROWTH < 24 HOURS  Final   Report Status PENDING  Incomplete  Blood culture (routine x 2)     Status: None (Preliminary result)   Collection Time: 12/12/14  8:18 PM  Result Value Ref Range Status   Specimen Description BLOOD RESISTANT HEEL OF FOOT  Final   Special Requests Normal  Final   Culture NO GROWTH < 24 HOURS  Final   Report Status PENDING  Incomplete  MRSA PCR Screening     Status: Abnormal   Collection Time: 12/13/14  1:30 AM  Result Value Ref Range Status   MRSA by PCR POSITIVE (A) NEGATIVE Final    Comment:        The GeneXpert MRSA Assay (FDA approved for NASAL specimens only), is one component of a comprehensive MRSA  colonization surveillance program. It is not intended to diagnose MRSA infection nor to guide or monitor treatment for MRSA infections. CRITICAL RESULT CALLED TO, READ BACK BY AND VERIFIED WITH: RENEE BABB @0347  12/13/14 BY AJO     Radiology Reports Ct Abdomen Pelvis Wo Contrast  11/21/2014   CLINICAL DATA:  Restaging endometrial carcinoma metastatic to pelvic side wall. Suspected hepatic metastatic disease. Previous personal history of cervical cancer with radiation therapy. Subsequent encounter.  EXAM: CT ABDOMEN AND PELVIS WITHOUT CONTRAST  TECHNIQUE: Multidetector CT imaging of the abdomen and pelvis was performed following the standard protocol without IV contrast.  COMPARISON:  Abdominal pelvic CT 06/18/2014.  Chest CT 01/25/2012.  FINDINGS: Lower chest: There is persistent concern of an enlarging lingular nodule, measuring 2.0 cm on image 1. This is incompletely visualized. The right lung base appears clear. No other suspicious nodules identified. There is no significant residual pleural fluid. A small pericardial effusion and a small hiatal hernia are noted. Probable presternal sebaceous cyst appears unchanged.  Hepatobiliary: There is progressive diffuse hepatic steatosis. No focal lesions are identified on noncontrast imaging. The sensitivity of noncontrast CT for focal liver disease is decreased in the setting of hepatic steatosis. There are multiple gallstones. No significant biliary dilatation identified.  Pancreas:  Atrophied without focal abnormality.  Spleen: Normal in size without suspicious focal abnormality. Multiple small calcified granulomas noted.  Adrenals/Urinary Tract: Both adrenal glands appear normal.The kidneys appear stable with cortical thinning and atrophy. There are renal vascular calcifications, but no hydronephrosis. 5 mm hyperdense lesion in the lower pole of the right kidney on image 36 is stable. There is anterior bladder wall thickening with a small amount of gas  within the bladder lumen. No focal mass lesion identified.  Stomach/Bowel: No evidence of bowel wall thickening, distention or surrounding inflammatory change.Mild colonic diverticular changes are stable.  Vascular/Lymphatic: There is diffuse atherosclerosis of the aorta, its branches and the iliac arteries. Iliac stents are present There has been continued progression in extensive retroperitoneal lymphadenopathy. Pre caval node measures 4.2 x 3.6 cm on image 30 (previously 3.8 x 3.5 cm). Left periaortic node measures 3.6 x 2.6 cm on image 36 (previously 3.0 x 2.4 cm). Enlarged retroperitoneal lymph nodes extend into the false pelvis. There is no adenopathy in the lower pelvis.  Reproductive: Status post hysterectomy. No evidence of adnexal mass.  Other: Stable postsurgical changes within the anterior abdominal wall with absent subcutaneous fat inferiorly.  Musculoskeletal: No acute or significant osseous findings. Stable degenerative changes throughout the spine.  IMPRESSION: 1. Progressive nodal metastatic disease throughout the retroperitoneum. 2. No evidence of extra nodal metastases in the abdomen or pelvis. Hepatic evaluation is limited by noncontrast imaging, especially in the setting of progressive hepatic steatosis. 3. Persistent concern of lingular nodule, incompletely visualized. This could reflect a metastasis or primary bronchogenic carcinoma. The patient has no recent chest CT for correlation. Full chest CT recommended for further evaluation. 4. Stable incidental findings including bilateral renal atrophy, diffuse atherosclerosis and cholelithiasis.   Electronically Signed   By: Richardean Sale M.D.   On: 11/21/2014 10:56   Dg Chest 2 View  11/18/2014   CLINICAL DATA:  Altered mental status  EXAM: CHEST  2 VIEW  COMPARISON:  07/31/2014  FINDINGS: Right midlung pulmonary nodule is again evident without significant interval change, measuring 14 mm. The lungs are otherwise clear. Pulmonary vasculature  is normal. There are no large pleural effusions.  There is a right subclavian Port-A-Cath with tip in the SVC.  There are calcified nodes in the right paratracheal region, unchanged.  IMPRESSION: Unchanged 14 mm pulmonary nodule on the right.  No acute cardiopulmonary findings.   Electronically Signed   By: Andreas Newport M.D.   On: 11/18/2014 00:31   Ct Head Wo Contrast  11/18/2014   CLINICAL DATA:  Poor historian, abnormal behavior. History of diabetes an uterine cancer.  EXAM: CT HEAD WITHOUT CONTRAST  TECHNIQUE: Contiguous axial images were obtained from the base of the skull through the vertex without intravenous contrast.  COMPARISON:  CT head report dated Oct 27, 2012 though images are not available for direct comparison.  FINDINGS: The ventricles and sulci are normal for age. No intraparenchymal hemorrhage, mass effect nor midline shift. Patchy supratentorial white matter hypodensities are less than expected for patient's age and though non-specific suggest sequelae of chronic small vessel ischemic disease. No acute large vascular territory infarcts. Tiny hypodensities in the bilateral basal ganglia.  No abnormal extra-axial fluid collections. Basal cisterns are patent. Moderate calcific atherosclerosis of the carotid siphons.  No skull fracture. The included ocular globes and orbital contents are non-suspicious. Bilateral ocular lens implants. The mastoid aircells and included paranasal sinuses are well-aerated.  IMPRESSION: No acute intracranial process  Involutional changes. Bilateral basal ganglia perivascular spaces and/or lacunar infarcts.  Mild to moderate white matter changes most consistent chronic small vessel ischemic disease.   Electronically Signed   By: Elon Alas M.D.   On: 11/18/2014 00:37   Dg Chest Portable 1 View  12/12/2014   CLINICAL DATA:  Vomiting today.  States for uterine cancer.  EXAM: PORTABLE CHEST - 1 VIEW  COMPARISON:  PA and lateral chest 11/17/2014.  FINDINGS:  1.4 cm nodule projecting in the right mid lung is unchanged. The lungs are otherwise clear. Heart size is normal. Calcified mediastinal lymph nodes are noted.  IMPRESSION: No acute disease.  No change in a 1.4 cm in pulmonary nodule on the right.   Electronically Signed   By: Inge Rise M.D.   On: 12/12/2014 21:39     CBC  Recent Labs Lab 12/12/14 1955 12/13/14 0740  WBC 16.6* 11.4*  HGB 3.0* 11.8*  HCT 10.0* 36.0  PLT 413 285  MCV 86.9 82.7  MCH 25.6* 27.0  MCHC 29.5* 32.6  RDW 19.9* 16.7*  LYMPHSABS 3.1  --   MONOABS 0.7  --   EOSABS 0.0  --   BASOSABS 0.1  --     Chemistries   Recent Labs Lab 12/12/14 1955 12/13/14 0740  NA 132* 142  K 3.9 3.5  CL 101 114*  CO2 17* 21*  GLUCOSE 894* 160*  BUN 38* 26*  CREATININE 1.47* 1.08*  CALCIUM 7.8* 8.2*  AST 18  --   ALT 10*  --   ALKPHOS 43  --   BILITOT 0.4  --    ------------------------------------------------------------------------------------------------------------------ estimated creatinine clearance is 37.9 mL/min (by C-G formula based on Cr of 1.08). ------------------------------------------------------------------------------------------------------------------ No results for input(s): HGBA1C in the last 72 hours. ------------------------------------------------------------------------------------------------------------------ No results for input(s): CHOL, HDL, LDLCALC, TRIG, CHOLHDL, LDLDIRECT in the last 72 hours. ------------------------------------------------------------------------------------------------------------------ No results for input(s): TSH, T4TOTAL, T3FREE, THYROIDAB in the last 72 hours.  Invalid input(s): FREET3 ------------------------------------------------------------------------------------------------------------------ No results for input(s): VITAMINB12,  FOLATE, FERRITIN, TIBC, IRON, RETICCTPCT in the last 72 hours.  Coagulation profile  Recent Labs Lab 12/12/14 1955   INR 1.37    No results for input(s): DDIMER in the last 72 hours.  Cardiac Enzymes  Recent Labs Lab 12/12/14 1955  TROPONINI <0.03   ------------------------------------------------------------------------------------------------------------------ Invalid input(s): POCBNP    Assessment & Plan   1.  Anemia likely secondary  to GI bleed. Status post transfusion continue Protonix drip GI consult pending 2. GI bleed-expected upper GI bleed. 3. Uncontrolled diabetes mellitus type 2 with severe hyperglycemia, no DKA. Patient will be converted to Lantus and pre-meal insulin 4. Leukocytosis, likely sepsis-UTI, stage II decubitus ulcer sacral region. Continue aggressive IV anabiotic's with Zosyn and vancomycin 5. Metastatic uterine cancer, currently not under any treatment. Heartis poor 6. Hypertension, stable continue atenolol and clonidine 7. Hyperlipidemia, continue pravastatin 8. Code full asked palliative care to see in light of multiple medical problems     Code Status Orders        Start     Ordered   12/13/14 0056  Full code   Continuous     12/13/14 0055    Advance Directive Documentation        Most Recent Value   Type of Advance Directive  Healthcare Power of Attorney   Pre-existing out of facility DNR order (yellow form or pink MOST form)     "MOST" Form in Place?             Consults  GI DVT Prophylaxis  SCDs and let a GI bleed  Lab Results  Component Value Date   PLT 285 12/13/2014     Time Spent in minutes  45 minutes of critical care time   Greater than 50% of time spent in care coordination and counseling.   [name] M.D on 12/13/2014 at 12:10 PM  Between 7am to 6pm - Pager - [phone]  After 6pm go to [email or web address] - password [email or web address] [facility]   Office  [phone]

## 2014-12-13 NOTE — Care Management Note (Signed)
Case Management Note  Patient Details  Name: Shelley Fields MRN: [id number] Date of Birth: 06-26-1935  Subjective/Objective:   Attempted to speak with patient but she was sleeping soundly. Admitted with a GI bleed, HBG 3. Received 2units PRBC. HGB now 11.  Positive for a UTI. PMH: Metastatic uterine cancer not under treatment. TC to sister, Shelley Fields. She reports patient lives at home alone. Her sister is staying with her temporarily but lives in [place] and has to return home. At baseline, patient is dependent with adls. She uses a walker to ambulate. Patient active with [facility] for home health nursing, PT and HHA. It is noted in reviewing past encounters that patient recently had a fall. She was recently released from [facility]. Sister in inquiring about Hospice services.Requested PT and palliative care consult. Will follow.                      Action/Plan:   Expected Discharge Date:                  Expected Discharge Plan:  [place]  In-House Referral:     Discharge planning Services  CM Consult  Post Acute Care Choice:    Choice offered to:     DME Arranged:    DME Agency:     HH Arranged:    [place] Agency:     Status of Service:  In process, will continue to follow  Medicare Important Message Given:    Date Medicare IM Given:    Medicare IM give by:    Date Additional Medicare IM Given:    Additional Medicare Important Message give by:     If discussed at [place] of Stay Meetings, dates discussed:    Additional Comments:  Jolly Mango, RN 12/13/2014, 1:29 PM Admiotted with a upper GI bleed.

## 2014-12-13 NOTE — Progress Notes (Signed)
PT Cancellation Note  Patient Details Name: Shelley Fields MRN: [id number] DOB: 1936-03-12   Cancelled Treatment:    Reason Eval/Treat Not Completed:  (See PT order for further details) PT to wait for results of GI consultation. Will attempt in PM.    [employer] 12/13/2014, 10:04 AM  Janyth Contes, SPT. [phone]

## 2014-12-13 NOTE — H&P (Signed)
[facility] [place] NAME: Shelley Fields    MR#:  [id number]  DATE OF BIRTH:  1936/03/06  DATE OF ADMISSION:  12/12/2014  PRIMARY CARE PHYSICIAN: [name], MD   REQUESTING/REFERRING PHYSICIAN: Darrick Penna  CHIEF COMPLAINT:   Chief Complaint  Patient presents with  . Emesis   vomiting dark/brown color material since morning  HISTORY OF PRESENT ILLNESS:  Shelley Fields  is a 79 y.o. female with a known history of below mentioned past medical history presents to the emergency room via EMS with the complaints of ongoing vomiting./brown colored material since morning. Denies any fever, chills, shortness of breath, chest pain, abdominal pain, diarrhea. No cough. In the emergency room patient was evaluated by the ED physician and found to be with stable vital signs and workup revealed severe anemia with hemoglobin and hematocrit of 3.0/10.0. Patient was also noted to have markedly elevated blood glucose of 894, serum bicarbonate 17 and normal anion gap. WBC elevated at 16.6, urinalysis with WBC too numerous to count, leukocyte esterase positive. Stool guaiac heme positive per ED physician's note. After obtaining blood and urine cultures, patient was started on IV antibiotics vancomycin and Zosyn, was also started on insulin drip, and 2 units of PRBC transfusion ordered by the ED physician which is pending at the time. Hospitalist service was consulted for further management.  PAST MEDICAL HISTORY:   Past Medical History  Diagnosis Date  . Diabetes mellitus without complication   . C. difficile diarrhea   . Uterine cancer   . Hypogammaglobulinemia   . Pulmonary embolism   . Decubitus ulcers   . Hypertension   . Hypercholesterolemia   . Incontinence of urine   . Cancer     Endometrial Cancer  . Cervical cancer     with radiation 45 years ago...  . Arthritis   . GERD (gastroesophageal reflux disease)   . Bladder cancer   . Decubitus  ulcer of buttock, stage 1     PAST SURGICAL HISTORY:   Past Surgical History  Procedure Laterality Date  . Total abdominal hysterectomy w/ bilateral salpingoophorectomy  07/29/2010  . Stent in leg  2016  . Cataract extraction, bilateral    . Bladder surgery    . Femoral endarterectomy      right  . Leg surgery      right leg  . Portacath placement      right    SOCIAL HISTORY:   History  Substance Use Topics  . Smoking status: Never Smoker   . Smokeless tobacco: Not on file  . Alcohol Use: Not on file    FAMILY HISTORY:   Family History  Problem Relation Age of Onset  . Hypertension Mother   . Diabetes Mellitus II Mother   . Coronary artery disease Mother   . Hypertension Father   . Diabetes Mellitus II Father   . CAD Father     DRUG ALLERGIES:   Allergies  Allergen Reactions  . Contrast Media [Iodinated Diagnostic Agents]     REVIEW OF SYSTEMS:   Review of Systems  Constitutional: Positive for malaise/fatigue. Negative for fever and chills.  HENT: Negative for ear pain, hearing loss, nosebleeds, sore throat and tinnitus.   Eyes: Negative for blurred vision, double vision, pain, discharge and redness.  Respiratory: Negative for cough, hemoptysis, sputum production, shortness of breath and wheezing.   Cardiovascular: Negative for chest pain, palpitations, orthopnea and leg swelling.  Gastrointestinal: Positive  for nausea, vomiting and blood in stool. Negative for abdominal pain, diarrhea, constipation and melena.  Genitourinary: Negative for dysuria, urgency, frequency and hematuria.  Musculoskeletal: Negative for back pain, joint pain and neck pain.  Skin: Negative for itching and rash.  Neurological: Negative for dizziness, tingling, sensory change, focal weakness and seizures.  Endo/Heme/Allergies: Does not bruise/bleed easily.  Psychiatric/Behavioral: Negative for depression. The patient is not nervous/anxious.     MEDICATIONS AT HOME:   Prior to  Admission medications   Medication Sig Start Date End Date Taking? Authorizing Provider  amLODipine (NORVASC) 5 MG tablet TK 1 T PO QD 11/12/14   Historical Provider, MD  atenolol (TENORMIN) 50 MG tablet Take 50 mg by mouth daily. 10/24/14   Historical Provider, MD  benazepril (LOTENSIN) 40 MG tablet Take 40 mg by mouth daily. 11/01/14   Historical Provider, MD  cephALEXin (KEFLEX) 500 MG capsule Take 1 capsule (500 mg total) by mouth 4 (four) times daily. 11/18/14   Joanne Gavel, MD  cloNIDine (CATAPRES) 0.2 MG tablet Take 0.2 mg by mouth 2 (two) times daily. 10/24/14   Historical Provider, MD  furosemide (LASIX) 20 MG tablet Take 20 mg by mouth every other day. 10/24/14   Historical Provider, MD  HUMULIN 70/30 (70-30) 100 UNIT/ML injection 16 Units daily at 6 PM. 11/01/14   Historical Provider, MD  hydrALAZINE (APRESOLINE) 100 MG tablet Take 100 mg by mouth 2 (two) times daily. 10/24/14   Historical Provider, MD  lovastatin (MEVACOR) 20 MG tablet Take 20 mg by mouth daily. 10/31/14   Historical Provider, MD  meclizine (ANTIVERT) 25 MG tablet Take 25 mg by mouth daily. 10/24/14   Historical Provider, MD  megestrol (MEGACE ES) 625 MG/5ML suspension Take 1.6 mLs (200 mg total) by mouth daily. to stimulate appetite 11/23/14   Lequita Asal, MD  mirtazapine (REMERON) 15 MG tablet Take 15 mg by mouth daily. 10/24/14   Historical Provider, MD  NOVOLOG FLEXPEN 100 UNIT/ML FlexPen Per Sliding Scale... 10/24/14   Historical Provider, MD  nystatin (MYCOSTATIN) powder  10/24/14   Historical Provider, MD  omeprazole (PRILOSEC) 20 MG capsule Take 20 mg by mouth daily. 08/24/14   Historical Provider, MD  ONE TOUCH ULTRA TEST test strip  10/31/14   Historical Provider, MD  potassium chloride (K-DUR) 10 MEQ tablet Take 10 mEq by mouth every other day. 10/31/14   Historical Provider, MD  promethazine (PHENERGAN) 12.5 MG tablet Take 12.5 mg by mouth every 6 (six) hours as needed. 10/24/14   Historical Provider, MD  SANTYL  ointment  10/24/14   Historical Provider, MD  tamsulosin (FLOMAX) 0.4 MG CAPS capsule Take 0.4 mg by mouth 2 (two) times daily. 10/24/14   Historical Provider, MD  warfarin (COUMADIN) 1 MG tablet Take 0.5 mg by mouth daily. 10/24/14   Historical Provider, MD      VITAL SIGNS:  Blood pressure 160/117, pulse 111, temperature 98.4 F (36.9 C), resp. rate 27, height 5' (1.524 m), weight 68.04 kg (150 lb), SpO2 97 %.  PHYSICAL EXAMINATION:  Physical Exam  Constitutional: She is oriented to person, place, and time. No distress.  Chronically ill-looking.  HENT:  Head: Normocephalic and atraumatic.  Right Ear: External ear normal.  Left Ear: External ear normal.  Nose: Nose normal.  Mouth/Throat: Oropharynx is clear and moist. No oropharyngeal exudate.  Eyes: EOM are normal. Pupils are equal, round, and reactive to light. No scleral icterus.  Pallor +  Neck: Normal range of motion. Neck  supple. No JVD present. No thyromegaly present.  Cardiovascular: Normal rate, regular rhythm, normal heart sounds and intact distal pulses.  Exam reveals no friction rub.   No murmur heard. Respiratory: Effort normal and breath sounds normal. No respiratory distress. She has no wheezes. She has no rales. She exhibits no tenderness.  GI: Soft. Bowel sounds are normal. She exhibits no distension and no mass. There is no tenderness. There is no rebound and no guarding.  Musculoskeletal: Normal range of motion. She exhibits no edema.  Lymphadenopathy:    She has no cervical adenopathy.  Neurological: She is alert and oriented to person, place, and time. She has normal reflexes. She displays normal reflexes. No cranial nerve deficit. She exhibits normal muscle tone.  Skin: Skin is warm. No rash noted. There is erythema.  Stage II decubiti ulcer over sacrum region +.  Psychiatric: She has a normal mood and affect. Her behavior is normal. Thought content normal.   LABORATORY PANEL:   CBC  Recent Labs Lab  12/12/14 1955  WBC 16.6*  HGB 3.0*  HCT 10.0*  PLT 413   ------------------------------------------------------------------------------------------------------------------  Chemistries   Recent Labs Lab 12/12/14 1955  NA 132*  K 3.9  CL 101  CO2 17*  GLUCOSE 894*  BUN 38*  CREATININE 1.47*  CALCIUM 7.8*  AST 18  ALT 10*  ALKPHOS 43  BILITOT 0.4   ------------------------------------------------------------------------------------------------------------------  Cardiac Enzymes  Recent Labs Lab 12/12/14 1955  TROPONINI <0.03   ------------------------------------------------------------------------------------------------------------------  RADIOLOGY:  Dg Chest Portable 1 View  12/12/2014   CLINICAL DATA:  Vomiting today.  States for uterine cancer.  EXAM: PORTABLE CHEST - 1 VIEW  COMPARISON:  PA and lateral chest 11/17/2014.  FINDINGS: 1.4 cm nodule projecting in the right mid lung is unchanged. The lungs are otherwise clear. Heart size is normal. Calcified mediastinal lymph nodes are noted.  IMPRESSION: No acute disease.  No change in a 1.4 cm in pulmonary nodule on the right.   Electronically Signed   By: Inge Rise M.D.   On: 12/12/2014 21:39    EKG:   Orders placed or performed during the hospital encounter of 12/12/14  . ED EKG normal sinus rhythm with ventricular rate of 92 bpm, nonspecific ST-T wave abnormality.   . ED EKG    IMPRESSION AND PLAN:   1. Severe anemia secondary to GI bleed. 2. GI bleed-upper GI bleed. 3. Uncontrolled diabetes mellitus type 2 with severe hyperglycemia, no DKA. 4. Leukocytosis, likely sepsis-UTI, stage II decubitus ulcer sacral region. 5. Metastatic uterine cancer, currently not under any treatment. 6. Hypertension, stable on home medications. 7. Hyperlipidemia, stable on home medications.  Plan: Admit to ICU, nothing by mouth, transfuse 2 units of PRBC, follow-up hemoglobin and hematocrit. -Nothing by mouth, IV  Protonix, GI consultation for further evaluation of UGI bleed. -Continue insulin drip, follow-up BMP, potassium, every hourly Accu-Cheks. -Continue vancomycin and Zosyn, follow-up blood and urine cultures, wound care. -Oncology consultation for further advice on uterine cancer. -Continue home meds for hypertension and hyperlipidemia.    All the records are reviewed and case discussed with ED provider. Management plans discussed with the patient, family and they are in agreement.  CODE STATUS: Full code  TOTAL TIME TAKING CARE OF THIS PATIENT: 50 minutes.    Azucena Freed N M.D on 12/13/2014 at 12:04 AM  Between 7am to 6pm - Pager - [phone]  After 6pm go to [email or web address] - [facility]  Office  [phone]  CC: Primary care physician; [name], MD

## 2014-12-13 NOTE — ED Notes (Signed)
Pt has one reddened area at edge of diaper area where thigh meets buttocks. Pt also with a sacral ulcer that is reddened, with ulcer present and scabbing. Dr Reece Levy and I observed these wounds when Dr Reece Levy was in room to admit pt

## 2014-12-13 NOTE — Consult Note (Addendum)
GI Inpatient Consult Note  Reason for Consult: UGI bleeding.   Attending Requesting Consult:  History of Present Illness: Shelley Fields is a 79 y.o. female with severe anemia. Multiple medical problems, incl metastatic uterine cancer. Hgb only 3 on admission. Hx of GERD. Nausea. Rectal bleeding. BG out of control. Also, admitted with sepsis. Now better. No abdominal pain. Recalls having normal colonoscopy few years ago. Need to confirm.   Past Medical History:  Past Medical History  Diagnosis Date  . Diabetes mellitus without complication   . C. difficile diarrhea   . Uterine cancer   . Hypogammaglobulinemia   . Pulmonary embolism   . Decubitus ulcers   . Hypertension   . Hypercholesterolemia   . Incontinence of urine   . Cancer     Endometrial Cancer  . Cervical cancer     with radiation 45 years ago...  . Arthritis   . GERD (gastroesophageal reflux disease)   . Bladder cancer   . Decubitus ulcer of buttock, stage 1     Problem List: Patient Active Problem List   Diagnosis Date Noted  . Severe anemia 12/12/2014  . GI bleed 12/12/2014  . DM (diabetes mellitus), type 2, uncontrolled 12/12/2014  . Diabetes mellitus with hyperglycemia 12/12/2014  . Leucocytosis 12/12/2014  . UTI (urinary tract infection) 12/12/2014  . Decubitus ulcer of back, stage 2 12/12/2014  . HTN (hypertension) 12/12/2014  . Anorexia 11/23/2014  . Encounter for other specified administrative purpose 07/10/2013  . Referral of patient 07/10/2013  . Candidal vulvovaginitis 10/14/2012  . Candida vaginitis 10/14/2012  . Polypharmacy 06/23/2012  . Long term current use of opiate analgesic 06/23/2012  . Breast lump 06/10/2012  . Laboratory examination 04/12/2012  . Diagnostic skin and sensitization tests 04/12/2012  . Bladder infection, chronic 03/23/2012  . Total urinary incontinence 03/23/2012  . 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 Female genuine stress incontinence 03/23/2012  . Disorder of  bladder function 03/23/2012  . Incomplete bladder emptying 03/23/2012  . 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 Cystitis, radiation 03/23/2012  . Cancer of body of uterus 03/23/2012  . Cancer of corpus uteri, except isthmus 03/23/2012  . Bladder neoplasm of uncertain malignant potential 03/23/2012  . Infection of urinary tract 03/23/2012  . Colon polyp 01/06/2012  . Difficulty in walking 01/06/2012  . Diabetes 01/06/2012  . Deep vein thrombosis 01/06/2012  . Cancer of endometrium 01/06/2012  . Contact with and exposure to tuberculosis 01/06/2012  . BP (high blood pressure) 01/06/2012  . Angiopathy, peripheral 01/06/2012  . Abnormal presence of protein in urine 01/06/2012  . Retinopathy 01/06/2012    Past Surgical History: Past Surgical History  Procedure Laterality Date  . Total abdominal hysterectomy w/ bilateral salpingoophorectomy  07/29/2010  . Stent in leg  2016  . Cataract extraction, bilateral    . Bladder surgery    . Femoral endarterectomy      right  . Leg surgery      right leg  . Portacath placement      right    Allergies: Allergies  Allergen Reactions  . Contrast Media [Iodinated Diagnostic Agents]     Home Medications: Prescriptions prior to admission  Medication Sig Dispense Refill Last Dose  . amLODipine (NORVASC) 5 MG tablet TK 1 T PO QD  0 Taking  . atenolol (TENORMIN) 50 MG tablet Take 50 mg by mouth daily.   Taking  . benazepril (LOTENSIN) 40 MG tablet Take 40 mg by mouth daily.   Taking  .  cephALEXin (KEFLEX) 500 MG capsule Take 1 capsule (500 mg total) by mouth 4 (four) times daily. 28 capsule 0 Taking  . cloNIDine (CATAPRES) 0.2 MG tablet Take 0.2 mg by mouth 2 (two) times daily.   Taking  . furosemide (LASIX) 20 MG tablet Take 20 mg by mouth every other day.   Taking  . HUMULIN 70/30 (70-30) 100 UNIT/ML injection 16 Units daily at 6 PM.   Taking  . hydrALAZINE (APRESOLINE) 100 MG tablet Take 100 mg by mouth 2 (two) times daily.   Taking   . lovastatin (MEVACOR) 20 MG tablet Take 20 mg by mouth daily.   Taking  . meclizine (ANTIVERT) 25 MG tablet Take 25 mg by mouth daily.   Taking  . megestrol (MEGACE ES) 625 MG/5ML suspension Take 1.6 mLs (200 mg total) by mouth daily. to stimulate appetite 150 mL 0 Taking  . mirtazapine (REMERON) 15 MG tablet Take 15 mg by mouth daily.   Taking  . NOVOLOG FLEXPEN 100 UNIT/ML FlexPen Per Sliding Scale...   Taking  . nystatin (MYCOSTATIN) powder    Taking  . omeprazole (PRILOSEC) 20 MG capsule Take 20 mg by mouth daily.  12 Taking  . ONE TOUCH ULTRA TEST test strip    Taking  . potassium chloride (K-DUR) 10 MEQ tablet Take 10 mEq by mouth every other day.   Taking  . promethazine (PHENERGAN) 12.5 MG tablet Take 12.5 mg by mouth every 6 (six) hours as needed.   Taking  . SANTYL ointment    Taking  . tamsulosin (FLOMAX) 0.4 MG CAPS capsule Take 0.4 mg by mouth 2 (two) times daily.   Taking  . warfarin (COUMADIN) 1 MG tablet Take 0.5 mg by mouth daily.   Taking   Home medication reconciliation was completed with the patient.   Scheduled Inpatient Medications:   . amLODipine  5 mg Oral Daily  . atenolol  50 mg Oral Daily  . benazepril  40 mg Oral Daily  . Chlorhexidine Gluconate Cloth  6 each Topical Q0600  . cloNIDine  0.2 mg Oral BID  . collagenase   Topical Daily  . furosemide  20 mg Oral QODAY  . hydrALAZINE  100 mg Oral BID  . insulin aspart  2-6 Units Subcutaneous 6 times per day  . insulin glargine  10 Units Subcutaneous Q24H  . mirtazapine  15 mg Oral Daily  . mupirocin ointment  1 application Nasal BID  . piperacillin-tazobactam (ZOSYN)  IV  3.375 g Intravenous 3 times per day  . potassium chloride  10 mEq Intravenous Once  . pravastatin  10 mg Oral q1800  . tamsulosin  0.4 mg Oral BID  . vancomycin  750 mg Intravenous Q24H    Continuous Inpatient Infusions:   . insulin (NOVOLIN-R) infusion 1 mL/hr at 12/13/14 1158  . pantoprozole (PROTONIX) infusion 8 mg/hr (12/13/14  1159)    PRN Inpatient Medications:    Family History: family history includes CAD in her father; Coronary artery disease in her mother; Diabetes Mellitus II in her father and mother; Hypertension in her father and mother.  The patient's family history is negative for inflammatory bowel disorders, GI malignancy, or solid organ transplantation.  Social History:   reports that she has never smoked. She does not have any smokeless tobacco history on file. The patient denies ETOH, tobacco, or drug use.   Review of Systems: Constitutional: Weight is stable.  Eyes: No changes in vision. ENT: No oral lesions, sore throat.  GI: see HPI.  Heme/Lymph: No easy bruising.  CV: No chest pain.  GU: No hematuria.  Integumentary: No rashes.  Neuro: No headaches.  Psych: No depression/anxiety.  Endocrine: No heat/cold intolerance.  Allergic/Immunologic: No urticaria.  Resp: No cough, SOB.  Musculoskeletal: No joint swelling.    Physical Examination: BP 147/36 mmHg  Pulse 66  Temp(Src) 98.9 F (37.2 C) (Axillary)  Resp 24  Ht 5' (1.524 m)  Wt 69.3 kg (152 lb 12.5 oz)  BMI 29.84 kg/m2  SpO2 99% Gen: NAD, alert and oriented x 4 HEENT: PEERLA, EOMI, Neck: supple, no JVD or thyromegaly Chest: CTA bilaterally, no wheezes, crackles, or other adventitious sounds CV: RRR, no m/g/c/r Abd: soft, NT, ND, +BS in all four quadrants; no HSM, guarding, ridigity, or rebound tenderness Ext: no edema, well perfused with 2+ pulses, Skin: no rash or lesions noted Lymph: no LAD  Data: Lab Results  Component Value Date   WBC 11.4* 12/13/2014   HGB 11.8* 12/13/2014   HCT 36.0 12/13/2014   MCV 82.7 12/13/2014   PLT 285 12/13/2014    Recent Labs Lab 12/12/14 1955 12/13/14 0740  HGB 3.0* 11.8*   Lab Results  Component Value Date   NA 142 12/13/2014   K 3.5 12/13/2014   CL 114* 12/13/2014   CO2 21* 12/13/2014   BUN 26* 12/13/2014   CREATININE 1.08* 12/13/2014   Lab Results  Component  Value Date   ALT 10* 12/12/2014   AST 18 12/12/2014   ALKPHOS 43 12/12/2014   BILITOT 0.4 12/12/2014    Recent Labs Lab 12/12/14 1955  APTT 23*  INR 1.37   Assessment/Plan: Ms. Thomason is a 79 y.o. female with probable UGI bleeding.  Recommendations: Ice chips today. NPO after MN. PPI IV. Plan EGD tomorrow. Thanks. Thank you for the consult. Please call with questions or concerns.  Clarence Cogswell, Lupita Dawn, MD

## 2014-12-13 NOTE — Consult Note (Signed)
WOC wound consult note Reason for Consult: Stage III pressure ulcer to sacrum and upper buttocks, present on admission.  Wound type:Stage III pressure ulcer Pressure Ulcer POA: Yes Measurement:Sacrum 2 cm x 1.5 cm x 1 cm 100% thin yellow slough to wound bed.  Right buttock near gluteal fold 1 cm x 0.5 cm x 0.1 cm 100% thin yellow slough to wound bed Wound bed: 100% thin yellow slough Drainage (amount, consistency, odor) Minimal serosanguinous drainage. No odor.  Periwound: Blanchable redness Dressing procedure/placement/frequency:Cleanse sacral and buttock ulcers with NS and pat gently dry. Apply Santyl ointment to wound bed. Cover with NS moist 2x2 and secure with ABD pad and tape. Change daily.  Will not follow at this time. Please re-consult if needed.  Domenic Moras RN BSN [place] Pager [phone]

## 2014-12-13 NOTE — Progress Notes (Signed)
ANTIBIOTIC CONSULT NOTE - INITIAL  Pharmacy Consult for Vancomycin/Zosyn Indication: rule out sepsis  Allergies  Allergen Reactions  . Contrast Media [Iodinated Diagnostic Agents]     Patient Measurements: Height: 5' (152.4 cm) Weight: 150 lb (68.04 kg) IBW/kg (Calculated) : 45.5 Adjusted Body Weight: 54.5 kg  Vital Signs: Temp: 98.4 F (36.9 C) (07/06 1922) BP: 134/104 mmHg (07/07 0029) Pulse Rate: 84 (07/07 0029) Intake/Output from previous day:   Intake/Output from this shift:    Labs:  Recent Labs  12/12/14 1955  WBC 16.6*  HGB 3.0*  PLT 413  CREATININE 1.47*   Estimated Creatinine Clearance: 27.6 mL/min (by C-G formula based on Cr of 1.47). No results for input(s): VANCOTROUGH, VANCOPEAK, VANCORANDOM, GENTTROUGH, GENTPEAK, GENTRANDOM, TOBRATROUGH, TOBRAPEAK, TOBRARND, AMIKACINPEAK, AMIKACINTROU, AMIKACIN in the last 72 hours.   Microbiology: Recent Results (from the past 720 hour(s))  Urine culture     Status: None   Collection Time: 11/18/14  1:34 AM  Result Value Ref Range Status   Specimen Description URINE, CLEAN CATCH  Final   Special Requests Normal  Final   Culture   Final    50,000 COLONIES/mL CITROBACTER SPECIES >=100,000 COLONIES/mL STREPTOCOCCUS AGALACTIAE Virtually 100% of S. agalactiae (Group B) strains are susceptible to Penicillin.  For Penicillin-allergic patients, Erythromycin (85-95% sensitive) and Clindamycin (80% sensitive) are drugs of choice. Contact microbiology lab to request sensitivities if  needed within 7 days.    Report Status 11/20/2014 FINAL  Final   Organism ID, Bacteria CITROBACTER SPECIES  Final      Susceptibility   Citrobacter species - MIC*    CEFTAZIDIME <=1 SENSITIVE Sensitive     CEFAZOLIN >=64 RESISTANT Resistant     CEFTRIAXONE <=1 SENSITIVE Sensitive     CIPROFLOXACIN <=0.25 SENSITIVE Sensitive     GENTAMICIN <=1 SENSITIVE Sensitive     IMIPENEM <=0.25 SENSITIVE Sensitive     TRIMETH/SULFA <=20 SENSITIVE  Sensitive     CEFOXITIN 32 RESISTANT Resistant     * 50,000 COLONIES/mL CITROBACTER SPECIES    Medical History: Past Medical History  Diagnosis Date  . Diabetes mellitus without complication   . C. difficile diarrhea   . Uterine cancer   . Hypogammaglobulinemia   . Pulmonary embolism   . Decubitus ulcers   . Hypertension   . Hypercholesterolemia   . Incontinence of urine   . Cancer     Endometrial Cancer  . Cervical cancer     with radiation 45 years ago...  . Arthritis   . GERD (gastroesophageal reflux disease)   . Bladder cancer   . Decubitus ulcer of buttock, stage 1     Medications:  Scheduled:  . amLODipine  5 mg Oral Daily  . atenolol  50 mg Oral Daily  . benazepril  40 mg Oral Daily  . cloNIDine  0.2 mg Oral BID  . furosemide  20 mg Oral QODAY  . hydrALAZINE  100 mg Oral BID  . mirtazapine  15 mg Oral Daily  . piperacillin-tazobactam (ZOSYN)  IV  3.375 g Intravenous 3 times per day  . potassium chloride  10 mEq Intravenous Once  . pravastatin  10 mg Oral q1800  . tamsulosin  0.4 mg Oral BID  . vancomycin  750 mg Intravenous Q24H   Infusions:  . sodium chloride    . insulin (NOVOLIN-R) infusion 5.4 Units/hr (12/13/14 0018)  . pantoprozole (PROTONIX) infusion 8 mg/hr (12/12/14 2316)   PRN:   Assessment: Patient admitted with leukocytosis, possible sepsis, UTI, decubitus  ulcer ordered empiric abx.   Goal of Therapy:  Vancomycin trough level 15-20 mcg/ml  Plan:  1. Vancomycin 1000 mg iv once given then 750 mg iv q 24 hours with stacked dosing.   2. Zosyn 3.375 g EI q 8 h.   Ulice Dash D 12/13/2014,1:05 AM

## 2014-12-14 ENCOUNTER — Other Ambulatory Visit: Payer: Medicare Other

## 2014-12-14 ENCOUNTER — Encounter
Admission: EM | Disposition: A | Discharge status: Rehabilitation Facility | Payer: Self-pay | Source: Home / Self Care | Attending: Internal Medicine

## 2014-12-14 ENCOUNTER — Encounter: Payer: Self-pay | Admitting: *Deleted

## 2014-12-14 ENCOUNTER — Inpatient Hospital Stay: Payer: Medicare Other | Admitting: Anesthesiology

## 2014-12-14 ENCOUNTER — Ambulatory Visit: Payer: Medicare Other | Admitting: Hematology and Oncology

## 2014-12-14 HISTORY — PX: ESOPHAGOGASTRODUODENOSCOPY: SHX5428

## 2014-12-14 LAB — BASIC METABOLIC PANEL
Anion gap: 8 (ref 5–15)
BUN: 22 mg/dL — ABNORMAL HIGH (ref 6–20)
CO2: 20 mmol/L — ABNORMAL LOW (ref 22–32)
CREATININE: 1.22 mg/dL — AB (ref 0.44–1.00)
Calcium: 7.8 mg/dL — ABNORMAL LOW (ref 8.9–10.3)
Chloride: 114 mmol/L — ABNORMAL HIGH (ref 101–111)
GFR calc Af Amer: 48 mL/min — ABNORMAL LOW (ref >60)
GFR calc non Af Amer: 42 mL/min — ABNORMAL LOW (ref >60)
Glucose, Bld: 158 mg/dL — ABNORMAL HIGH (ref 65–99)
POTASSIUM: 3.3 mmol/L — AB (ref 3.5–5.1)
Sodium: 142 mmol/L (ref 135–145)

## 2014-12-14 LAB — TYPE AND SCREEN
ABO/RH(D): A POS
ANTIBODY SCREEN: NEGATIVE
Unit division: 0
Unit division: 0

## 2014-12-14 LAB — CBC
HCT: 37.3 % (ref 35.0–47.0)
HEMOGLOBIN: 12.1 g/dL (ref 12.0–16.0)
MCH: 26.9 pg (ref 26.0–34.0)
MCHC: 32.3 g/dL (ref 32.0–36.0)
MCV: 83.2 fL (ref 80.0–100.0)
PLATELETS: 275 10*3/uL (ref 150–440)
RBC: 4.49 MIL/uL (ref 3.80–5.20)
RDW: 17.6 % — ABNORMAL HIGH (ref 11.5–14.5)
WBC: 10.5 10*3/uL (ref 3.6–11.0)

## 2014-12-14 LAB — GLUCOSE, CAPILLARY
GLUCOSE-CAPILLARY: 150 mg/dL — AB (ref 65–99)
Glucose-Capillary: 105 mg/dL — ABNORMAL HIGH (ref 65–99)
Glucose-Capillary: 143 mg/dL — ABNORMAL HIGH (ref 65–99)
Glucose-Capillary: 147 mg/dL — ABNORMAL HIGH (ref 65–99)
Glucose-Capillary: 172 mg/dL — ABNORMAL HIGH (ref 65–99)
Glucose-Capillary: >600 mg/dL (ref 65–99)

## 2014-12-14 LAB — PROTIME-INR
INR: 2.11
Prothrombin Time: 23.8 seconds — ABNORMAL HIGH (ref 11.4–15.0)

## 2014-12-14 SURGERY — EGD (ESOPHAGOGASTRODUODENOSCOPY)
Anesthesia: General

## 2014-12-14 MED ORDER — MORPHINE SULFATE 2 MG/ML IJ SOLN
2.0000 mg | Freq: Once | INTRAMUSCULAR | Status: AC
  Administered 2014-12-14: 2 mg via INTRAVENOUS
  Filled 2014-12-14: qty 1

## 2014-12-14 MED ORDER — PROPOFOL 10 MG/ML IV BOLUS
INTRAVENOUS | Status: DC | PRN
  Administered 2014-12-14: 20 mg via INTRAVENOUS

## 2014-12-14 MED ORDER — MIDAZOLAM HCL 2 MG/2ML IJ SOLN
INTRAMUSCULAR | Status: DC | PRN
  Administered 2014-12-14: 1 mg via INTRAVENOUS

## 2014-12-14 MED ORDER — POTASSIUM CHLORIDE 10 MEQ/100ML IV SOLN
10.0000 meq | INTRAVENOUS | Status: AC
  Administered 2014-12-14 (×3): 10 meq via INTRAVENOUS
  Filled 2014-12-14 (×4): qty 100

## 2014-12-14 MED ORDER — LIDOCAINE HCL (CARDIAC) 20 MG/ML IV SOLN
INTRAVENOUS | Status: DC | PRN
  Administered 2014-12-14: 100 mg via INTRAVENOUS

## 2014-12-14 MED ORDER — BOOST / RESOURCE BREEZE PO LIQD
1.0000 | Freq: Three times a day (TID) | ORAL | Status: DC
  Administered 2014-12-15 – 2014-12-17 (×8): 1 via ORAL

## 2014-12-14 MED ORDER — GLYCOPYRROLATE 0.2 MG/ML IJ SOLN
INTRAMUSCULAR | Status: DC | PRN
  Administered 2014-12-14: 0.1 mg via INTRAVENOUS

## 2014-12-14 MED ORDER — FENTANYL CITRATE (PF) 100 MCG/2ML IJ SOLN
INTRAMUSCULAR | Status: DC | PRN
  Administered 2014-12-14: 50 ug via INTRAVENOUS

## 2014-12-14 MED ORDER — ONDANSETRON HCL 4 MG/2ML IJ SOLN
4.0000 mg | Freq: Four times a day (QID) | INTRAMUSCULAR | Status: DC | PRN
  Administered 2014-12-14 (×2): 4 mg via INTRAVENOUS

## 2014-12-14 MED ORDER — PROPOFOL INFUSION 10 MG/ML OPTIME
INTRAVENOUS | Status: DC | PRN
  Administered 2014-12-14: 50 ug/kg/min via INTRAVENOUS

## 2014-12-14 MED ORDER — PANTOPRAZOLE SODIUM 40 MG PO TBEC
40.0000 mg | DELAYED_RELEASE_TABLET | Freq: Two times a day (BID) | ORAL | Status: DC
  Administered 2014-12-14 – 2014-12-17 (×6): 40 mg via ORAL
  Filled 2014-12-14 (×6): qty 1

## 2014-12-14 MED ORDER — POTASSIUM CHLORIDE 10 MEQ/100ML IV SOLN
10.0000 meq | INTRAVENOUS | Status: DC
  Filled 2014-12-14 (×2): qty 100

## 2014-12-14 MED ORDER — MORPHINE SULFATE 2 MG/ML IJ SOLN
1.0000 mg | INTRAMUSCULAR | Status: DC | PRN
  Administered 2014-12-14: 1 mg via INTRAVENOUS
  Filled 2014-12-14: qty 1

## 2014-12-14 MED ORDER — DEXTROSE 50 % IV SOLN: 25.0000 mL | Freq: Once | INTRAVENOUS | Status: DC

## 2014-12-14 MED ORDER — ONDANSETRON HCL 4 MG/2ML IJ SOLN
INTRAMUSCULAR | Status: AC
  Administered 2014-12-14: 4 mg
  Filled 2014-12-14: qty 2

## 2014-12-14 MED ORDER — POTASSIUM CHLORIDE 10 MEQ/100ML IV SOLN
10.0000 meq | INTRAVENOUS | Status: DC
  Administered 2014-12-14: 10 meq via INTRAVENOUS

## 2014-12-14 NOTE — Progress Notes (Signed)
Pt c/o pain to abd area.10/10 cramping . Notified physician and one dose of morphine 2mg  given . Which was effective pt now resting quietly.

## 2014-12-14 NOTE — Op Note (Signed)
[facility] Gastroenterology Patient Name: Shelley Fields Procedure Date: 12/14/2014 2:12 PM MRN: [id number] Account #: [id number] Date of Birth: [birth year] Admit Type: Inpatient Age: 79 Room: [facility] ENDO ROOM 4 Gender: Female Note Status: Finalized Procedure:         Upper GI endoscopy Indications:       Dyspepsia, Hematochezia Providers:         Lupita Dawn. Candace Cruise, MD Referring MD:      Laurine Blazer (Referring MD) Medicines:         Monitored Anesthesia Care Complications:     No immediate complications. Procedure:         Pre-Anesthesia Assessment:                    - Prior to the procedure, a History and Physical was                     performed, and patient medications, allergies and                     sensitivities were reviewed. The patient's tolerance of                     previous anesthesia was reviewed.                    - The risks and benefits of the procedure and the sedation                     options and risks were discussed with the patient. All                     questions were answered and informed consent was obtained.                    - After reviewing the risks and benefits, the patient was                     deemed in satisfactory condition to undergo the procedure.                    After obtaining informed consent, the endoscope was passed                     under direct vision. Throughout the procedure, the                     patient's blood pressure, pulse, and oxygen saturations                     were monitored continuously. The Endoscope was introduced                     through the mouth, and advanced to the second part of                     duodenum. The upper GI endoscopy was accomplished without                     difficulty. The patient tolerated the procedure well. Findings:      The examined esophagus was normal.      Diffuse mildly erythematous mucosa was found in the gastric antrum.       Biopsies were  taken with a cold  forceps for histology.      The exam was otherwise without abnormality.      The examined duodenum was normal. Impression:        - Normal esophagus.                    - Erythematous mucosa in the antrum. Biopsied.                    - The examination was otherwise normal.                    - Normal examined duodenum. Recommendation:    - Discharge patient to home.                    - Observe patient's clinical course.                    - The findings and recommendations were discussed with the                     patient. Procedure Code(s): --- Professional ---                    [phone], Esophagogastroduodenoscopy, flexible, transoral;                     with biopsy, single or multiple Diagnosis Code(s): --- Professional ---                    K31.9, Disease of stomach and duodenum, unspecified                    K30, Functional dyspepsia                    K92.1, Melena CPT copyright 2014 [employer]. All rights reserved. The codes documented in this report are preliminary and upon coder review may  be revised to meet current compliance requirements. Hulen Luster, MD 12/14/2014 2:23:32 PM This report has been signed electronically. Number of Addenda: 0 Note Initiated On: 12/14/2014 2:12 PM      [facility]

## 2014-12-14 NOTE — Progress Notes (Signed)
PT Cancellation Note  Patient Details Name: Shelley Fields MRN: [id number] DOB: 03/10/1936   Cancelled Treatment:    Reason Eval/Treat Not Completed:  (See PT note for further details) PT attempted this AM. Pt to be taken for EGD shortly, and PT should defer to tomorrow per nurse. PT will hold and attempt tomorrow.    Janyth Contes 12/14/2014, 9:28 AM Janyth Contes, SPT. [phone]

## 2014-12-14 NOTE — Anesthesia Preprocedure Evaluation (Addendum)
Anesthesia Evaluation  Patient identified by MRN, date of birth, ID band Patient awake    Reviewed: Allergy & Precautions, H&P , NPO status , Patient's Chart, lab work & pertinent test results, reviewed documented beta blocker date and time   Airway Mallampati: II  TM Distance: >3 FB Neck ROM: full    Dental  (+) Loose, Caps, Chipped, Missing   Pulmonary neg pulmonary ROS,  breath sounds clear to auscultation  Pulmonary exam normal       Cardiovascular hypertension, + Peripheral Vascular Disease - Past MI Normal cardiovascular examRhythm:regular Rate:Normal     Neuro/Psych negative neurological ROS  negative psych ROS   GI/Hepatic Neg liver ROS, GERD-  ,  Endo/Other  diabetes, Well Controlled, Type 2  Renal/GU negative Renal ROS  negative genitourinary   Musculoskeletal   Abdominal   Peds  Hematology negative hematology ROS (+)   Anesthesia Other Findings Reports of vomiting in notes, However, patient and nursing both report that this has been "dry heaving" with no output other than minimal saliva.   Past Medical History:   Diabetes mellitus without complication                       C. difficile diarrhea                                        Uterine cancer                                               Hypogammaglobulinemia                                        Pulmonary embolism                                           Decubitus ulcers                                             Hypertension                                                 Hypercholesterolemia                                         Incontinence of urine                                        Cancer  Comment:Endometrial Cancer   Cervical cancer                                                Comment:with radiation 45 years ago...   Arthritis                                                     GERD (gastroesophageal reflux disease)                       Bladder cancer                                               Decubitus ulcer of buttock, stage 1                          Reproductive/Obstetrics negative OB ROS                            Anesthesia Physical Anesthesia Plan  ASA: III  Anesthesia Plan: General   Post-op Pain Management:    Induction:   Airway Management Planned:   Additional Equipment:   Intra-op Plan:   Post-operative Plan:   Informed Consent: I have reviewed the patients History and Physical, chart, labs and discussed the procedure including the risks, benefits and alternatives for the proposed anesthesia with the patient or authorized representative who has indicated his/her understanding and acceptance.   Dental Advisory Given  Plan Discussed with: Anesthesiologist, CRNA and Surgeon  Anesthesia Plan Comments: (Consent with patient and with sister over the phone (per patient request).  DNR suspended for procedure per patient and sister.)       Anesthesia Quick Evaluation

## 2014-12-14 NOTE — Consult Note (Signed)
  Pt feeling nauseous this AM. Some vomiting but no hematemesis. Diarrhea today without any melena or gross hematochezia. INR above 2 today though no coumadin since admission. INR low yesterday. Will proceed with EGD this afternoon. May not be able to do much therapeutics due to elevated INR. Pt understands. Thanks.

## 2014-12-14 NOTE — Plan of Care (Signed)
Problem: Phase I Progression Outcomes Goal: Pain controlled with appropriate interventions Outcome: Progressing painfree post egd Goal: OOB as tolerated unless otherwise ordered Outcome: Not Progressing PT was not here due to EGD Goal: Initial discharge plan identified Outcome: Progressing Pt plans home with sister Goal: Voiding-avoid urinary catheter unless indicated Outcome: Not Progressing Chronic foley cath from home Goal: Hemodynamically stable Outcome: Progressing VSS normal sinus rhythm with occasional unifocal pvc's.  KCL replaced today  Problem: Phase II Progression Outcomes Goal: No active bleeding Outcome: Progressing No bloody stools.  hgb stable Goal: Tolerating diet Outcome: Progressing Started on clear liquid diet.

## 2014-12-14 NOTE — Progress Notes (Signed)
[facility] Hematology/Oncology Progress Note  Date of admission: 12/12/2014  Hospital day:  12/13/2014  Chief Complaint: Shelley Fields is a 79 y.o. female with metastatic uterine cancer who was admitted with an upper GI bleed and UTI.  History of Present Illness: The patient has recurrent stage IV endoemtrial carcinoma. She underwent TAH/BSO on 07/29/2010. She was initially felt to have have IIIC disease, but later stage IV disease secondary to likely liver metastasis. Tumor was ER negative. She received 11 cycles of carboplatin and Taxol (10/07/2010 until 06/29/2011). She required dose modifications secondary to cytopenias.  She has been followed with serial CA 125s. There has been a steady increase in her CA 125 (1936 on 06/20/2014 and 4186 on 11/21/2014). Imaging studies revealed slight nodal progression. Her performance status limits the ability to treat her endometrial cancer.  Abdominal and pelvic CT scan on 11/21/2014 revealed progressive nodal metastatic disease throughout the retroperitoneum. A precaval node measured 4.2 x 3.6 cm (previously 3.8 x 3.5 cm) the left periaortic node measured 3.6 x 2.6 cm (previously 3 x 2.4 cm) There was no evidence of extranodal metastasis in the abdomen or pelvis although imaging was limited by nocontrast. There was a persistent concern of a lingular nodule.   Her course has been complicated by a pulmonary embolism in 08/2010. She is on chronic Coumadin. She has a history of recurrent C. difficile infection and hypogammaglobulinemia. She has a recent history of UTI sepsis, sacral decubitus, and acute renal insufficiency.   She was at [facility] for 100 days. She has been followed by home health care. Appetite and weight have recently increased slightly on Megace. She has made very little progress.  Consideration has been made for possible Hospice if she does not improve with aggressive supportive care.    She was last seen  in the medical oncology clinic on 11/30/2014.  Her Coumadin was adjusted slightly secondary to her sub-therapeutic INR.  She had a sacral decubitus ulcer for which wound care was consulted.  She had significant Candida perineal rash secondary to ongoing urine leakage.  A temporary Foley catheter was placed.  The patient is unclear how she got to the hospital. She does admit to a several day history of feeling bad.  Notes indicate that she was vomiting brown colored material.  Blood sugar extremely high.  Initial hemoglobin was 3.  Patient has been tranfused with PRBCs with stabilization of hematocrit.  She was admitted to the ICU.  She has been on a Protonix drip.  She is scheduled for EGD tomorrow.  Urinalysis reveals too numerous to count WBC (urine culture pending).  She has been started on  Vancomycin and Zosyn.  She has been hemodynatmcally stable.   Social History: The patient is alone today.  She states that if she is unable to speak for herself, that her sister is to make medical decisions for her.  Allergies:  Allergies  Allergen Reactions  . Contrast Media [Iodinated Diagnostic Agents]     Scheduled Medications: . amLODipine  5 mg Oral Daily  . atenolol  50 mg Oral Daily  . benazepril  40 mg Oral Daily  . Chlorhexidine Gluconate Cloth  6 each Topical Q0600  . cloNIDine  0.2 mg Oral BID  . collagenase   Topical Daily  . dextrose  25 mL Intravenous Once  . furosemide  20 mg Oral QODAY  . hydrALAZINE  100 mg Oral BID  . insulin aspart  2-6 Units Subcutaneous 6 times  per day  . insulin glargine  10 Units Subcutaneous Q24H  . mirtazapine  15 mg Oral Daily  . mupirocin ointment  1 application Nasal BID  . piperacillin-tazobactam (ZOSYN)  IV  3.375 g Intravenous 3 times per day  . potassium chloride  10 mEq Intravenous Once  . pravastatin  10 mg Oral q1800  . tamsulosin  0.4 mg Oral BID  . vancomycin  750 mg Intravenous Q24H    Review of Systems: GENERAL: Felt bad for several  days. Minimal activity. No fevers, sweats. PERFORMANCE STATUS (ECOG): 3 HEENT: No visual changes, runny nose, sore throat, mouth sores or tenderness. Lungs: No shortness of breath or cough. No hemoptysis. Cardiac: No chest pain, palpitations, orthopnea, or PND. GI: Does not remember vomiting blood or dark colored material.  No diarrhea, constipation, melena or hematochezia. GU: Bladder leak. No urgency, frequency, dysuria, or hematuria. Musculoskeletal: No back pain. No joint pain. No muscle tenderness. Extremities: No pain or swelling.  Left heel ulcer healing.  Neuro: General weakness. No headache, numbness or weakness, balance or coordination issues. Endocrine: Diabetes. No thyroid issues, hot flashes or night sweats. Psych: No mood changes, depression or anxiety. Pain: No focal pain. Review of systems: All other systems reviewed and found to be negative  Physical Exam: Blood pressure 154/55, pulse 59, temperature 98.4 F (36.9 C), temperature source Oral, resp. rate 22, height 5' (1.524 m), weight 152 lb 12.5 oz (69.3 kg), SpO2 98 %.  GENERAL: Chronically ill appearing woman lying comfortably in the ICU in no acute distress. MENTAL STATUS: Alert and oriented to person, place and time. HEAD: Pearline Cables hair. Normocephalic, atraumatic, face symmetric, no Cushingoid features. Left upper cheek eschar. EYES: No conjunctivitis or scleral icterus. ENT: Oropharynx clear without lesion. No thrush.  RESPIRATORY: Clear to auscultation without rales, wheezes or rhonchi. CARDIOVASCULAR: Regular rate and rhythm without murmur, rub or gallop. ABDOMEN: Soft, non-tender, with active bowel sounds, and no hepatosplenomegaly. No masses. SKIN: Left heel eschar.  Stage II/III sacral decubitus ulcer.  EXTREMITIES: No edema, no skin discoloration or tenderness. No palpable cords. NEUROLOGICAL: Unremarkable. PSYCH: Appropriate.   Results for orders placed or performed during  the hospital encounter of 12/12/14 (from the past 48 hour(s))  ABO/Rh     Status: None   Collection Time: 12/12/14  7:11 PM  Result Value Ref Range   ABO/RH(D) A POS   Glucose, capillary     Status: Abnormal   Collection Time: 12/12/14  7:24 PM  Result Value Ref Range   Glucose-Capillary >600 (HH) 65 - 99 mg/dL  Blood culture (routine x 2)     Status: None (Preliminary result)   Collection Time: 12/12/14  7:35 PM  Result Value Ref Range   Specimen Description BLOOD    Special Requests Normal    Culture NO GROWTH < 24 HOURS    Report Status PENDING   Prepare RBC     Status: None   Collection Time: 12/12/14  7:50 PM  Result Value Ref Range   Order Confirmation ORDER PROCESSED BY BLOOD BANK   CBC with Differential     Status: Abnormal   Collection Time: 12/12/14  7:55 PM  Result Value Ref Range   WBC 16.6 (H) 3.6 - 11.0 K/uL   RBC 1.16 (L) 3.80 - 5.20 MIL/uL   Hemoglobin 3.0 (LL) 12.0 - 16.0 g/dL    Comment: CRITICAL RESULT CALLED TO, READ BACK BY AND VERIFIED WITH:  DR [name] AT 2153. TSH 12/12/14    HCT  10.0 (LL) 35.0 - 47.0 %    Comment: CRITICAL RESULT CALLED TO, READ BACK BY AND VERIFIED WITH:  DR [name] AT 2153. TSH 12/12/14    MCV 86.9 80.0 - 100.0 fL   MCH 25.6 (L) 26.0 - 34.0 pg   MCHC 29.5 (L) 32.0 - 36.0 g/dL   RDW 19.9 (H) 11.5 - 14.5 %   Platelets 413 150 - 440 K/uL   Neutrophils Relative % 77% %   Neutro Abs 12.7 (H) 1.4 - 6.5 K/uL   Lymphocytes Relative 19% %   Lymphs Abs 3.1 1.0 - 3.6 K/uL   Monocytes Relative 4% %   Monocytes Absolute 0.7 0.2 - 0.9 K/uL   Eosinophils Relative 0% %   Eosinophils Absolute 0.0 0 - 0.7 K/uL   Basophils Relative 0% %   Basophils Absolute 0.1 0 - 0.1 K/uL  Comprehensive metabolic panel     Status: Abnormal   Collection Time: 12/12/14  7:55 PM  Result Value Ref Range   Sodium 132 (L) 135 - 145 mmol/L   Potassium 3.9 3.5 - 5.1 mmol/L   Chloride 101 101 - 111 mmol/L   CO2 17 (L) 22 - 32 mmol/L   Glucose, Bld 894 (HH) 65 - 99 mg/dL     Comment: CRITICAL RESULT CALLED TO, READ BACK BY AND VERIFIED WITH Nayla NEEDHAM ON 12/22/2014 AT 2045 BY TB.    BUN 38 (H) 6 - 20 mg/dL   Creatinine, Ser 1.47 (H) 0.44 - 1.00 mg/dL   Calcium 7.8 (L) 8.9 - 10.3 mg/dL   Total Protein 6.7 6.5 - 8.1 g/dL   Albumin 2.5 (L) 3.5 - 5.0 g/dL   AST 18 15 - 41 U/L   ALT 10 (L) 14 - 54 U/L   Alkaline Phosphatase 43 38 - 126 U/L   Total Bilirubin 0.4 0.3 - 1.2 mg/dL   GFR calc non Af Amer 33 (L) >60 mL/min   GFR calc Af Amer 39 (L) >60 mL/min    Comment: (NOTE) The eGFR has been calculated using the CKD EPI equation. This calculation has not been validated in all clinical situations. eGFR's persistently <60 mL/min signify possible Chronic Kidney Disease.    Anion gap 14 5 - 15  Troponin I     Status: None   Collection Time: 12/12/14  7:55 PM  Result Value Ref Range   Troponin I <0.03 <0.031 ng/mL    Comment:        NO INDICATION OF MYOCARDIAL INJURY.   Type and screen     Status: None (Preliminary result)   Collection Time: 12/12/14  7:55 PM  Result Value Ref Range   ABO/RH(D) A POS    Antibody Screen NEG    Sample Expiration 12/15/2014    Unit Number [id number]    Blood Component Type RBC LR PHER2    Unit division 00    Status of Unit ISSUED    Transfusion Status OK TO TRANSFUSE    Crossmatch Result Compatible    Unit Number [id number]    Blood Component Type RED CELLS,LR    Unit division 00    Status of Unit ISSUED    Transfusion Status OK TO TRANSFUSE    Crossmatch Result Compatible   Urinalysis complete, with microscopic ([facility] only)     Status: Abnormal   Collection Time: 12/12/14  7:55 PM  Result Value Ref Range   Color, Urine YELLOW (A) YELLOW   APPearance CLOUDY (A) CLEAR   Glucose, UA >500 (  A) NEGATIVE mg/dL   Bilirubin Urine NEGATIVE NEGATIVE   Ketones, ur TRACE (A) NEGATIVE mg/dL   Specific Gravity, Urine 1.024 1.005 - 1.030   Hgb urine dipstick 2+ (A) NEGATIVE   pH 6.0 5.0 - 8.0   Protein, ur 100 (A)  NEGATIVE mg/dL   Nitrite NEGATIVE NEGATIVE   Leukocytes, UA 3+ (A) NEGATIVE   RBC / HPF 6-30 0 - 5 RBC/hpf   WBC, UA TOO NUMEROUS TO COUNT 0 - 5 WBC/hpf   Bacteria, UA RARE (A) NONE SEEN   Squamous Epithelial / LPF NONE SEEN NONE SEEN   WBC Clumps PRESENT    Mucous PRESENT   Lactic acid, plasma     Status: Abnormal   Collection Time: 12/12/14  7:55 PM  Result Value Ref Range   Lactic Acid, Venous 2.9 (HH) 0.5 - 2.0 mmol/L    Comment: CRITICAL RESULT CALLED TO, READ BACK BY AND VERIFIED WITH Haelyn NEEDHAM ON 12/12/14 AT 2045 BY TB.   Lipase, blood     Status: None   Collection Time: 12/12/14  7:55 PM  Result Value Ref Range   Lipase 48 22 - 51 U/L  APTT     Status: Abnormal   Collection Time: 12/12/14  7:55 PM  Result Value Ref Range   aPTT 23 (L) 24 - 36 seconds  Protime-INR     Status: Abnormal   Collection Time: 12/12/14  7:55 PM  Result Value Ref Range   Prothrombin Time 17.1 (H) 11.4 - 15.0 seconds   INR 1.37   Blood culture (routine x 2)     Status: None (Preliminary result)   Collection Time: 12/12/14  8:18 PM  Result Value Ref Range   Specimen Description BLOOD RESISTANT HEEL OF FOOT    Special Requests Normal    Culture NO GROWTH < 24 HOURS    Report Status PENDING   Glucose, capillary     Status: Abnormal   Collection Time: 12/12/14 11:42 PM  Result Value Ref Range   Glucose-Capillary >600 (HH) 65 - 99 mg/dL  Glucose, capillary     Status: Abnormal   Collection Time: 12/13/14  1:17 AM  Result Value Ref Range   Glucose-Capillary 578 (HH) 65 - 99 mg/dL   Comment 1 Notify RN   MRSA PCR Screening     Status: Abnormal   Collection Time: 12/13/14  1:30 AM  Result Value Ref Range   MRSA by PCR POSITIVE (A) NEGATIVE    Comment:        The GeneXpert MRSA Assay (FDA approved for NASAL specimens only), is one component of a comprehensive MRSA colonization surveillance program. It is not intended to diagnose MRSA infection nor to guide or monitor treatment for MRSA  infections. CRITICAL RESULT CALLED TO, READ BACK BY AND VERIFIED WITH: RENEE BABB _0  12/13/14 BY AJO   Glucose, capillary     Status: Abnormal   Collection Time: 12/13/14  1:57 AM  Result Value Ref Range   Glucose-Capillary 478 (H) 65 - 99 mg/dL  Glucose, capillary     Status: Abnormal   Collection Time: 12/13/14  3:02 AM  Result Value Ref Range   Glucose-Capillary 345 (H) 65 - 99 mg/dL  Glucose, capillary     Status: Abnormal   Collection Time: 12/13/14  3:45 AM  Result Value Ref Range   Glucose-Capillary 249 (H) 65 - 99 mg/dL  Glucose, capillary     Status: Abnormal   Collection Time: 12/13/14  4:31 AM  Result Value Ref Range   Glucose-Capillary 212 (H) 65 - 99 mg/dL  Glucose, capillary     Status: Abnormal   Collection Time: 12/13/14  5:19 AM  Result Value Ref Range   Glucose-Capillary 171 (H) 65 - 99 mg/dL  Glucose, capillary     Status: Abnormal   Collection Time: 12/13/14  6:20 AM  Result Value Ref Range   Glucose-Capillary 174 (H) 65 - 99 mg/dL  Glucose, capillary     Status: Abnormal   Collection Time: 12/13/14  7:15 AM  Result Value Ref Range   Glucose-Capillary 167 (H) 65 - 99 mg/dL  Lactic acid, plasma     Status: None   Collection Time: 12/13/14  7:40 AM  Result Value Ref Range   Lactic Acid, Venous 2.0 0.5 - 2.0 mmol/L  CBC     Status: Abnormal   Collection Time: 12/13/14  7:40 AM  Result Value Ref Range   WBC 11.4 (H) 3.6 - 11.0 K/uL   RBC 4.36 3.80 - 5.20 MIL/uL   Hemoglobin 11.8 (L) 12.0 - 16.0 g/dL    Comment: RESULT REPEATED AND VERIFIED   HCT 36.0 35.0 - 47.0 %   MCV 82.7 80.0 - 100.0 fL   MCH 27.0 26.0 - 34.0 pg   MCHC 32.6 32.0 - 36.0 g/dL   RDW 16.7 (H) 11.5 - 14.5 %   Platelets 285 150 - 440 K/uL  Basic metabolic panel     Status: Abnormal   Collection Time: 12/13/14  7:40 AM  Result Value Ref Range   Sodium 142 135 - 145 mmol/L   Potassium 3.5 3.5 - 5.1 mmol/L   Chloride 114 (H) 101 - 111 mmol/L   CO2 21 (L) 22 - 32 mmol/L   Glucose,  Bld 160 (H) 65 - 99 mg/dL   BUN 26 (H) 6 - 20 mg/dL   Creatinine, Ser 1.08 (H) 0.44 - 1.00 mg/dL   Calcium 8.2 (L) 8.9 - 10.3 mg/dL   GFR calc non Af Amer 48 (L) >60 mL/min   GFR calc Af Amer 56 (L) >60 mL/min    Comment: (NOTE) The eGFR has been calculated using the CKD EPI equation. This calculation has not been validated in all clinical situations. eGFR's persistently <60 mL/min signify possible Chronic Kidney Disease.    Anion gap 7 5 - 15  Glucose, capillary     Status: Abnormal   Collection Time: 12/13/14  8:46 AM  Result Value Ref Range   Glucose-Capillary 143 (H) 65 - 99 mg/dL  Glucose, capillary     Status: Abnormal   Collection Time: 12/13/14  9:37 AM  Result Value Ref Range   Glucose-Capillary 121 (H) 65 - 99 mg/dL  Glucose, capillary     Status: Abnormal   Collection Time: 12/13/14 10:40 AM  Result Value Ref Range   Glucose-Capillary 141 (H) 65 - 99 mg/dL  Glucose, capillary     Status: Abnormal   Collection Time: 12/13/14 11:53 AM  Result Value Ref Range   Glucose-Capillary 159 (H) 65 - 99 mg/dL  Glucose, capillary     Status: Abnormal   Collection Time: 12/13/14  1:00 PM  Result Value Ref Range   Glucose-Capillary 133 (H) 65 - 99 mg/dL  Glucose, capillary     Status: Abnormal   Collection Time: 12/13/14  4:38 PM  Result Value Ref Range   Glucose-Capillary 110 (H) 65 - 99 mg/dL  Glucose, capillary     Status: Abnormal   Collection Time:  12/13/14  6:32 PM  Result Value Ref Range   Glucose-Capillary 124 (H) 65 - 99 mg/dL  Basic metabolic panel     Status: Abnormal   Collection Time: 12/13/14  6:45 PM  Result Value Ref Range   Sodium 141 135 - 145 mmol/L   Potassium 3.5 3.5 - 5.1 mmol/L   Chloride 115 (H) 101 - 111 mmol/L   CO2 20 (L) 22 - 32 mmol/L   Glucose, Bld 127 (H) 65 - 99 mg/dL   BUN 22 (H) 6 - 20 mg/dL   Creatinine, Ser 1.15 (H) 0.44 - 1.00 mg/dL   Calcium 7.7 (L) 8.9 - 10.3 mg/dL   GFR calc non Af Amer 45 (L) >60 mL/min   GFR calc Af Amer 52  (L) >60 mL/min    Comment: (NOTE) The eGFR has been calculated using the CKD EPI equation. This calculation has not been validated in all clinical situations. eGFR's persistently <60 mL/min signify possible Chronic Kidney Disease.    Anion gap 6 5 - 15  CBC with Differential/Platelet     Status: Abnormal   Collection Time: 12/13/14  7:00 PM  Result Value Ref Range   WBC 10.0 3.6 - 11.0 K/uL   RBC 4.44 3.80 - 5.20 MIL/uL   Hemoglobin 12.1 12.0 - 16.0 g/dL   HCT 36.6 35.0 - 47.0 %   MCV 82.6 80.0 - 100.0 fL   MCH 27.2 26.0 - 34.0 pg   MCHC 32.9 32.0 - 36.0 g/dL   RDW 17.3 (H) 11.5 - 14.5 %   Platelets 285 150 - 440 K/uL   Neutrophils Relative % 60 %   Neutro Abs 6.1 1.4 - 6.5 K/uL   Lymphocytes Relative 29 %   Lymphs Abs 2.9 1.0 - 3.6 K/uL   Monocytes Relative 7 %   Monocytes Absolute 0.7 0.2 - 0.9 K/uL   Eosinophils Relative 3 %   Eosinophils Absolute 0.3 0 - 0.7 K/uL   Basophils Relative 1 %   Basophils Absolute 0.1 0 - 0.1 K/uL  Glucose, capillary     Status: Abnormal   Collection Time: 12/13/14  8:00 PM  Result Value Ref Range   Glucose-Capillary 124 (H) 65 - 99 mg/dL  Glucose, capillary     Status: Abnormal   Collection Time: 12/14/14 12:00 AM  Result Value Ref Range   Glucose-Capillary 105 (H) 65 - 99 mg/dL   Dg Chest Portable 1 View  12/12/2014   CLINICAL DATA:  Vomiting today.  States for uterine cancer.  EXAM: PORTABLE CHEST - 1 VIEW  COMPARISON:  PA and lateral chest 11/17/2014.  FINDINGS: 1.4 cm nodule projecting in the right mid lung is unchanged. The lungs are otherwise clear. Heart size is normal. Calcified mediastinal lymph nodes are noted.  IMPRESSION: No acute disease.  No change in a 1.4 cm in pulmonary nodule on the right.   Electronically Signed   By: Inge Rise M.D.   On: 12/12/2014 21:39    Assessment:  Shelley Fields is a 79 y.o. female with metastatic uterine cancer admitted with an upper GI bleed with associated anemia, UTI spesis, decubitus  ulcer, and general debilitation.  She is on chronic anticoagulation secondary to a history of DVT.  Plan: 1. Hematology/Oncology:  Patient transfused with PRBCs for UGI bleed.  Hemoglobin 3 to 11.8.  INR 1.37 on admission.  Agree with holding anticoagulation.  Consider Greenfield filter placement if patient felt high risk to restart anticoagulation.  General debilitation and performance  status limiting ability to treat metastatic uterine cancer.  We have discussed Hospice in the recent past.  Patient recently discharged from [facility] without improvement. 2. Gastroenterology:  UGI bleed.  On Protonix.  EGD tomorrow. 3. Infectious disease:  UTI sepsis.  Patient on broad spectrum antibiotics (vancomycin and Zosyn).  Following cultures.  MRSA + on isolation.  Watch for diarrhea (patient has a recent history of C difficile). 4. Wound care for sacral decubitus ulcer. 5. Endocrinology:  Poorly controlled diabetes on insulin drip.  Currently NPO. 6. Disposition:  Unclear if patient's sister will be able to care for her at discharge.  Will need skilled nursing facility at discharge. 7. Code status:  Patient does not wish heroic measure (chest compressions, cardioversion or intubation).  She requests pressors if needed.  Her sister cares for her at home and per patient to help with medical decision making.  Her daughter is back in [name] and is aware of her hospitalization.   Lequita Asal, MD

## 2014-12-14 NOTE — Progress Notes (Signed)
MEDICATION RELATED CONSULT NOTE - INITIAL   Pharmacy Consult for Electrolyte replacement Indication: electrolyte  Allergies  Allergen Reactions  . Contrast Media [Iodinated Diagnostic Agents]     Patient Measurements: Height: 5' (152.4 cm) Weight: 152 lb 12.5 oz (69.3 kg) IBW/kg (Calculated) : 45.5 Adjusted Body Weight:   Vital Signs: Temp: 98.4 F (36.9 C) (07/08 0800) Temp Source: Oral (07/08 0800) BP: 144/61 mmHg (07/08 1000) Pulse Rate: 82 (07/08 1000) Intake/Output from previous day: 07/07 0701 - 07/08 0700 In: 381.2 [I.V.:281.2; IV Piggyback:100] Out: 976 [Urine:975; Stool:1] Intake/Output from this shift: Total I/O In: 50 [IV Piggyback:50] Out: -   Labs:  Recent Labs  12/12/14 1955 12/13/14 0740 12/13/14 1845 12/13/14 1900 12/14/14 0624  WBC 16.6* 11.4*  --  10.0 10.5  HGB 3.0* 11.8*  --  12.1 12.1  HCT 10.0* 36.0  --  36.6 37.3  PLT 413 285  --  285 275  APTT 23*  --   --   --   --   CREATININE 1.47* 1.08* 1.15*  --  1.22*  ALBUMIN 2.5*  --   --   --   --   PROT 6.7  --   --   --   --   AST 18  --   --   --   --   ALT 10*  --   --   --   --   ALKPHOS 43  --   --   --   --   BILITOT 0.4  --   --   --   --    Estimated Creatinine Clearance: 33.5 mL/min (by C-G formula based on Cr of 1.22).   Microbiology: Recent Results (from the past 720 hour(s))  Urine culture     Status: None   Collection Time: 11/18/14  1:34 AM  Result Value Ref Range Status   Specimen Description URINE, CLEAN CATCH  Final   Special Requests Normal  Final   Culture   Final    50,000 COLONIES/mL CITROBACTER SPECIES >=100,000 COLONIES/mL STREPTOCOCCUS AGALACTIAE Virtually 100% of S. agalactiae (Group B) strains are susceptible to Penicillin.  For Penicillin-allergic patients, Erythromycin (85-95% sensitive) and Clindamycin (80% sensitive) are drugs of choice. Contact microbiology lab to request sensitivities if  needed within 7 days.    Report Status 11/20/2014 FINAL  Final    Organism ID, Bacteria CITROBACTER SPECIES  Final      Susceptibility   Citrobacter species - MIC*    CEFTAZIDIME <=1 SENSITIVE Sensitive     CEFAZOLIN >=64 RESISTANT Resistant     CEFTRIAXONE <=1 SENSITIVE Sensitive     CIPROFLOXACIN <=0.25 SENSITIVE Sensitive     GENTAMICIN <=1 SENSITIVE Sensitive     IMIPENEM <=0.25 SENSITIVE Sensitive     TRIMETH/SULFA <=20 SENSITIVE Sensitive     CEFOXITIN 32 RESISTANT Resistant     * 50,000 COLONIES/mL CITROBACTER SPECIES  Blood culture (routine x 2)     Status: None (Preliminary result)   Collection Time: 12/12/14  7:35 PM  Result Value Ref Range Status   Specimen Description BLOOD  Final   Special Requests Normal  Final   Culture NO GROWTH 2 DAYS  Final   Report Status PENDING  Incomplete  Blood culture (routine x 2)     Status: None (Preliminary result)   Collection Time: 12/12/14  8:18 PM  Result Value Ref Range Status   Specimen Description BLOOD RESISTANT HEEL OF FOOT  Final   Special Requests Normal  Final   Culture NO GROWTH 2 DAYS  Final   Report Status PENDING  Incomplete  Urine culture     Status: None (Preliminary result)   Collection Time: 12/13/14  1:20 AM  Result Value Ref Range Status   Specimen Description URINE, CATHETERIZED  Final   Special Requests Immunocompromised  Final   Culture   Final    >=100,000 COLONIES/mL GRAM NEGATIVE RODS IDENTIFICATION TO FOLLOW SUSCEPTIBILITIES TO FOLLOW    Report Status PENDING  Incomplete  MRSA PCR Screening     Status: Abnormal   Collection Time: 12/13/14  1:30 AM  Result Value Ref Range Status   MRSA by PCR POSITIVE (A) NEGATIVE Final    Comment:        The GeneXpert MRSA Assay (FDA approved for NASAL specimens only), is one component of a comprehensive MRSA colonization surveillance program. It is not intended to diagnose MRSA infection nor to guide or monitor treatment for MRSA infections. CRITICAL RESULT CALLED TO, READ BACK BY AND VERIFIED WITH: RENEE BABB @0347   12/13/14 BY AJO     Medical History: Past Medical History  Diagnosis Date  . Diabetes mellitus without complication   . C. difficile diarrhea   . Uterine cancer   . Hypogammaglobulinemia   . Pulmonary embolism   . Decubitus ulcers   . Hypertension   . Hypercholesterolemia   . Incontinence of urine   . Cancer     Endometrial Cancer  . Cervical cancer     with radiation 45 years ago...  . Arthritis   . GERD (gastroesophageal reflux disease)   . Bladder cancer   . Decubitus ulcer of buttock, stage 1     Medications:  Scheduled:  . amLODipine  5 mg Oral Daily  . atenolol  50 mg Oral Daily  . benazepril  40 mg Oral Daily  . Chlorhexidine Gluconate Cloth  6 each Topical Q0600  . cloNIDine  0.2 mg Oral BID  . collagenase   Topical Daily  . dextrose  25 mL Intravenous Once  . furosemide  20 mg Oral QODAY  . hydrALAZINE  100 mg Oral BID  . insulin aspart  2-6 Units Subcutaneous 6 times per day  . insulin glargine  10 Units Subcutaneous Q24H  . mirtazapine  15 mg Oral Daily  . mupirocin ointment  1 application Nasal BID  . piperacillin-tazobactam (ZOSYN)  IV  3.375 g Intravenous 3 times per day  . potassium chloride  10 mEq Intravenous Q1 Hr x 2  . potassium chloride  10 mEq Intravenous Q1 Hr x 4  . pravastatin  10 mg Oral q1800  . tamsulosin  0.4 mg Oral BID  . vancomycin  750 mg Intravenous Q24H    Assessment: Patient's electrolyte are all within normal range except K= 3.3.   Goal of Therapy:  Normalize electrolytes   Plan:  Will give KCl 10 mEq IVPB x4. Will order electrolyte panel with am labs.   Maciah Schweigert D 12/14/2014,11:05 AM

## 2014-12-14 NOTE — Consult Note (Addendum)
Palliative Medicine Inpatient Consult Follow Up Note   Name: Shelley Fields Date: 12/14/2014 MRN: [id number]  DOB: 1936/03/25  Referring Physician: [name], MD  Palliative Care consult requested for this 79 y.o. female for goals of medical therapy in patient with metastatic uterine cancer.  Marland Kitchen    REVIEW OF SYSTEMS:  Patient is not able to provide ROS due to lethargy/ sedation  CODE STATUS: DNR   PAST MEDICAL HISTORY: Past Medical History  Diagnosis Date  . Diabetes mellitus without complication   . C. difficile diarrhea   . Uterine cancer   . Hypogammaglobulinemia   . Pulmonary embolism   . Decubitus ulcers   . Hypertension   . Hypercholesterolemia   . Incontinence of urine   . Cancer     Endometrial Cancer  . Cervical cancer     with radiation 45 years ago...  . Arthritis   . GERD (gastroesophageal reflux disease)   . Bladder cancer   . Decubitus ulcer of buttock, stage 1     PAST SURGICAL HISTORY:  Past Surgical History  Procedure Laterality Date  . Total abdominal hysterectomy w/ bilateral salpingoophorectomy  07/29/2010  . Stent in leg  2016  . Cataract extraction, bilateral    . Bladder surgery    . Femoral endarterectomy      right  . Leg surgery      right leg  . Portacath placement      right    Vital Signs: BP 127/50 mmHg  Pulse 63  Temp(Src) 98.2 F (36.8 C) (Axillary)  Resp 23  Ht 5' (1.524 m)  Wt 69.3 kg (152 lb 12.5 oz)  BMI 29.84 kg/m2  SpO2 98% Filed Weights   12/12/14 1922 12/13/14 0100  Weight: 68.04 kg (150 lb) 69.3 kg (152 lb 12.5 oz)    Estimated body mass index is 29.84 kg/(m^2) as calculated from the following:   Height as of this encounter: 5' (1.524 m).   Weight as of this encounter: 69.3 kg (152 lb 12.5 oz).  PHYSICAL EXAM: She is just back from endoscopy where no active bleeding sites were noted. She is basically asleep Heart rrr no mgr Lungs cta no rales Abd soft with BS Skin pale but dry    LABS: CBC:     Component Value Date/Time   WBC 10.5 12/14/2014 0624   WBC 9.7 10/03/2014 1030   HGB 12.1 12/14/2014 0624   HGB 9.5* 10/03/2014 1030   HCT 37.3 12/14/2014 0624   HCT 30.7* 10/03/2014 1030   PLT 275 12/14/2014 0624   PLT 325 10/03/2014 1030   MCV 83.2 12/14/2014 0624   MCV 81 10/03/2014 1030   NEUTROABS 6.1 12/13/2014 1900   NEUTROABS 6.5 10/03/2014 1030   LYMPHSABS 2.9 12/13/2014 1900   LYMPHSABS 2.2 10/03/2014 1030   MONOABS 0.7 12/13/2014 1900   MONOABS 0.7 10/03/2014 1030   EOSABS 0.3 12/13/2014 1900   EOSABS 0.2 10/03/2014 1030   BASOSABS 0.1 12/13/2014 1900   BASOSABS 0.1 10/03/2014 1030   Comprehensive Metabolic Panel:    Component Value Date/Time   NA 142 12/14/2014 0624   NA 136 07/13/2014 1022   K 3.3* 12/14/2014 0624   K 4.8 07/13/2014 1022   CL 114* 12/14/2014 0624   CL 101 07/13/2014 1022   CO2 20* 12/14/2014 0624   CO2 26 07/13/2014 1022   BUN 22* 12/14/2014 0624   BUN 27* 09/04/2014 1113   CREATININE 1.22* 12/14/2014 0624   CREATININE 1.20* 10/03/2014 1030  GLUCOSE 158* 12/14/2014 0624   GLUCOSE 218* 07/13/2014 1022   CALCIUM 7.8* 12/14/2014 0624   CALCIUM 8.2* 07/13/2014 1022   AST 18 12/12/2014 1955   AST 17 06/13/2014 1410   ALT 10* 12/12/2014 1955   ALT 10* 06/13/2014 1410   ALKPHOS 43 12/12/2014 1955   ALKPHOS 50 06/13/2014 1410   BILITOT 0.4 12/12/2014 1955   PROT 6.7 12/12/2014 1955   PROT 7.3 06/13/2014 1410   ALBUMIN 2.5* 12/12/2014 1955   ALBUMIN 2.4* 06/13/2014 1410    IMPRESSION: Metastatic uterine cancer, currently not under any treatment. Severe anemia secondary to GI bleed--for upper endo tomorrow Uncontrolled diabetes mellitus type 2 with severe hyperglycemia, no DKA, but treated with insulin drip Leukocytosis, likely sepsis-UTI, Stage II decubitus ulcer sacral region. Hypertension, stable on home medications. Hyperlipidemia, stable on home medications. Chronic coumadin use for PE in 2012 History of recurrent CDiff  infections H/IO candidal skin infections H/O chronic / recurrent 'diaper rash' (as she sits in her urine per note from Dr Mike Gip) Weakness and poor performance status Hypokalemia Malnutrition, Moderately severe   PLAN: DNR is ordered Patient was too lethargic after coming back from upper endoscopy to have discussions today It seems that she will need to complete treatment for the current problem (GI bleed , UTI, hyperglycemia) and THEN it can be determined whether she is best suited for rehab stay or Hospice in a facility or even Hospice Home (if she were to deteriorate as she is not Hospice Home appropriate as yet).    There will be no Palliative Care Service for two weeks.  BUT, please consult case mgmt for a Hospice Consult if this is felt appropriate for patient and if pt (sister) consent to this consult.    REFERRALS TO BE ORDERED:  Consider Hospice Consult  --need to discuss w/ pt (when she is able to talk about it)   More than 50% of the visit was spent in counseling/coordination of care: YES  Time Spent: 25 min     Pt was seen just after 5 pm.

## 2014-12-14 NOTE — Progress Notes (Signed)
Shelley Fields at [facility]                                                                                                                                                                                            Patient Demographics   Shelley Fields, is a 79 y.o. female, DOB - Feb 22, 1936, KVQ:[id number]  Admit date - 12/12/2014   Admitting Physician Juluis Mire, MD  Outpatient Primary MD for the patient is [name], MD   LOS - 2  Subjective: Continues to have epigastric pain but denies any hematemesis. According to the nurse she had a stool which was clay color in nature. Patient also was seen by palliative care team yesterday and was made DO NOT RESUSCITATE.   Review of Systems:   CONSTITUTIONAL: No documented fever. No fatigue, weakness. No weight gain, no weight loss.  EYES: No blurry or double vision.  ENT: No tinnitus. No postnasal drip. No redness of the oropharynx.  RESPIRATORY: No cough, no wheeze, no hemoptysis. No dyspnea.  CARDIOVASCULAR: No chest pain. No orthopnea. No palpitations. No syncope.  GASTROINTESTINAL positive nausea, positive vomiting or diarrhea. No abdominal pain. No melena or hematochezia.  GENITOURINARY: No dysuria or hematuria.  ENDOCRINE: No polyuria or nocturia. No heat or cold intolerance.  HEMATOLOGY: Positive anemia. No bruising. No bleeding.  INTEGUMENTARY: No rashes. No lesions.  MUSCULOSKELETAL: No arthritis. No swelling. No gout.  NEUROLOGIC: No numbness, tingling, or ataxia. No seizure-type activity.  PSYCHIATRIC: No anxiety. No insomnia. No ADD.    Vitals:   Filed Vitals:   12/14/14 0000 12/14/14 0600 12/14/14 0800 12/14/14 1000  BP: 154/55 155/45 167/75 144/61  Pulse: 59 61 63 82  Temp: 98.4 F (36.9 C) 98.8 F (37.1 C) 98.4 F (36.9 C)   TempSrc: Oral Oral Oral   Resp: 22 19 20 20   Height:      Weight:      SpO2: 98% 98% 99% 98%    Wt Readings from Last 3 Encounters:  12/13/14 69.3  kg (152 lb 12.5 oz)  11/30/14 70.95 kg (156 lb 6.7 oz)  11/23/14 68.9 kg (151 lb 14.4 oz)     Intake/Output Summary (Last 24 hours) at 12/14/14 1150 Last data filed at 12/14/14 0800  Gross per 24 hour  Intake    351 ml  Output    976 ml  Net   -625 ml    Physical Exam:   GENERAL: Pleasant-appearing in no apparent distress.  HEAD, EYES, EARS, NOSE AND THROAT: Atraumatic, normocephalic. Extraocular muscles are intact. Pupils equal and reactive to light. Sclerae anicteric. No  conjunctival injection. No oro-pharyngeal erythema.  NECK: Supple. There is no jugular venous distention. No bruits, no lymphadenopathy, no thyromegaly.  HEART: Regular rate and rhythm, tachycardic. No murmurs, no rubs, no clicks.  LUNGS: Clear to auscultation bilaterally. No rales or rhonchi. No wheezes.  ABDOMEN: Soft, flat, nontender, nondistended. Has good bowel sounds. No hepatosplenomegaly appreciated.  EXTREMITIES: No evidence of any cyanosis, clubbing, or peripheral edema.  +2 pedal and radial pulses bilaterally.  NEUROLOGIC: The patient is alert, awake, and oriented x3 with no focal motor or sensory deficits appreciated bilaterally.  SKIN: Moist and warm with no rashes appreciated.  Psych: Not anxious, depressed LN: No inguinal LN enlargement    Antibiotics   Anti-infectives    Start     Dose/Rate Route Frequency Ordered Stop   12/13/14 0500  piperacillin-tazobactam (ZOSYN) IVPB 3.375 g     3.375 g 12.5 mL/hr over 240 Minutes Intravenous 3 times per day 12/13/14 0104     12/13/14 0330  vancomycin (VANCOCIN) IVPB 750 mg/150 ml premix  Status:  Discontinued     750 mg 150 mL/hr over 60 Minutes Intravenous Every 24 hours 12/13/14 0104 12/14/14 1149   12/12/14 2100  vancomycin (VANCOCIN) IVPB 1000 mg/200 mL premix     1,000 mg 200 mL/hr over 60 Minutes Intravenous  Once 12/12/14 2051 12/12/14 2248   12/12/14 2100  piperacillin-tazobactam (ZOSYN) IVPB 3.375 g     3.375 g 12.5 mL/hr over 240 Minutes  Intravenous  Once 12/12/14 2051 12/12/14 2131      Medications   Scheduled Meds: . amLODipine  5 mg Oral Daily  . atenolol  50 mg Oral Daily  . benazepril  40 mg Oral Daily  . Chlorhexidine Gluconate Cloth  6 each Topical Q0600  . cloNIDine  0.2 mg Oral BID  . collagenase   Topical Daily  . dextrose  25 mL Intravenous Once  . furosemide  20 mg Oral QODAY  . hydrALAZINE  100 mg Oral BID  . insulin aspart  2-6 Units Subcutaneous 6 times per day  . insulin glargine  10 Units Subcutaneous Q24H  . mirtazapine  15 mg Oral Daily  . mupirocin ointment  1 application Nasal BID  . piperacillin-tazobactam (ZOSYN)  IV  3.375 g Intravenous 3 times per day  . potassium chloride  10 mEq Intravenous Q1 Hr x 4  . pravastatin  10 mg Oral q1800  . tamsulosin  0.4 mg Oral BID   Continuous Infusions: . sodium chloride    . insulin (NOVOLIN-R) infusion 1 mL/hr at 12/13/14 1158  . pantoprozole (PROTONIX) infusion 8 mg/hr (12/14/14 1042)   PRN Meds:.   Data Review:   Micro Results Recent Results (from the past 240 hour(s))  Blood culture (routine x 2)     Status: None (Preliminary result)   Collection Time: 12/12/14  7:35 PM  Result Value Ref Range Status   Specimen Description BLOOD  Final   Special Requests Normal  Final   Culture NO GROWTH 2 DAYS  Final   Report Status PENDING  Incomplete  Blood culture (routine x 2)     Status: None (Preliminary result)   Collection Time: 12/12/14  8:18 PM  Result Value Ref Range Status   Specimen Description BLOOD RESISTANT HEEL OF FOOT  Final   Special Requests Normal  Final   Culture NO GROWTH 2 DAYS  Final   Report Status PENDING  Incomplete  Urine culture     Status: None (Preliminary result)  Collection Time: 12/13/14  1:20 AM  Result Value Ref Range Status   Specimen Description URINE, CATHETERIZED  Final   Special Requests Immunocompromised  Final   Culture   Final    >=100,000 COLONIES/mL GRAM NEGATIVE RODS IDENTIFICATION TO FOLLOW  SUSCEPTIBILITIES TO FOLLOW    Report Status PENDING  Incomplete  MRSA PCR Screening     Status: Abnormal   Collection Time: 12/13/14  1:30 AM  Result Value Ref Range Status   MRSA by PCR POSITIVE (A) NEGATIVE Final    Comment:        The GeneXpert MRSA Assay (FDA approved for NASAL specimens only), is one component of a comprehensive MRSA colonization surveillance program. It is not intended to diagnose MRSA infection nor to guide or monitor treatment for MRSA infections. CRITICAL RESULT CALLED TO, READ BACK BY AND VERIFIED WITH: RENEE BABB @[id number]  12/13/14 BY AJO     Radiology Reports Ct Abdomen Pelvis Wo Contrast  11/21/2014   CLINICAL DATA:  Restaging endometrial carcinoma metastatic to pelvic side wall. Suspected hepatic metastatic disease. Previous personal history of cervical cancer with radiation therapy. Subsequent encounter.  EXAM: CT ABDOMEN AND PELVIS WITHOUT CONTRAST  TECHNIQUE: Multidetector CT imaging of the abdomen and pelvis was performed following the standard protocol without IV contrast.  COMPARISON:  Abdominal pelvic CT 06/18/2014.  Chest CT 01/25/2012.  FINDINGS: Lower chest: There is persistent concern of an enlarging lingular nodule, measuring 2.0 cm on image 1. This is incompletely visualized. The right lung base appears clear. No other suspicious nodules identified. There is no significant residual pleural fluid. A small pericardial effusion and a small hiatal hernia are noted. Probable presternal sebaceous cyst appears unchanged.  Hepatobiliary: There is progressive diffuse hepatic steatosis. No focal lesions are identified on noncontrast imaging. The sensitivity of noncontrast CT for focal liver disease is decreased in the setting of hepatic steatosis. There are multiple gallstones. No significant biliary dilatation identified.  Pancreas:  Atrophied without focal abnormality.  Spleen: Normal in size without suspicious focal abnormality. Multiple small calcified  granulomas noted.  Adrenals/Urinary Tract: Both adrenal glands appear normal.The kidneys appear stable with cortical thinning and atrophy. There are renal vascular calcifications, but no hydronephrosis. 5 mm hyperdense lesion in the lower pole of the right kidney on image 36 is stable. There is anterior bladder wall thickening with a small amount of gas within the bladder lumen. No focal mass lesion identified.  Stomach/Bowel: No evidence of bowel wall thickening, distention or surrounding inflammatory change.Mild colonic diverticular changes are stable.  Vascular/Lymphatic: There is diffuse atherosclerosis of the aorta, its branches and the iliac arteries. Iliac stents are present There has been continued progression in extensive retroperitoneal lymphadenopathy. Pre caval node measures 4.2 x 3.6 cm on image 30 (previously 3.8 x 3.5 cm). Left periaortic node measures 3.6 x 2.6 cm on image 36 (previously 3.0 x 2.4 cm). Enlarged retroperitoneal lymph nodes extend into the false pelvis. There is no adenopathy in the lower pelvis.  Reproductive: Status post hysterectomy. No evidence of adnexal mass.  Other: Stable postsurgical changes within the anterior abdominal wall with absent subcutaneous fat inferiorly.  Musculoskeletal: No acute or significant osseous findings. Stable degenerative changes throughout the spine.  IMPRESSION: 1. Progressive nodal metastatic disease throughout the retroperitoneum. 2. No evidence of extra nodal metastases in the abdomen or pelvis. Hepatic evaluation is limited by noncontrast imaging, especially in the setting of progressive hepatic steatosis. 3. Persistent concern of lingular nodule, incompletely visualized. This could reflect a metastasis  or primary bronchogenic carcinoma. The patient has no recent chest CT for correlation. Full chest CT recommended for further evaluation. 4. Stable incidental findings including bilateral renal atrophy, diffuse atherosclerosis and cholelithiasis.    Electronically Signed   By: Richardean Sale M.D.   On: 11/21/2014 10:56   Dg Chest 2 View  11/18/2014   CLINICAL DATA:  Altered mental status  EXAM: CHEST  2 VIEW  COMPARISON:  07/31/2014  FINDINGS: Right midlung pulmonary nodule is again evident without significant interval change, measuring 14 mm. The lungs are otherwise clear. Pulmonary vasculature is normal. There are no large pleural effusions.  There is a right subclavian Port-A-Cath with tip in the SVC.  There are calcified nodes in the right paratracheal region, unchanged.  IMPRESSION: Unchanged 14 mm pulmonary nodule on the right.  No acute cardiopulmonary findings.   Electronically Signed   By: Andreas Newport M.D.   On: 11/18/2014 00:31   Ct Head Wo Contrast  11/18/2014   CLINICAL DATA:  Poor historian, abnormal behavior. History of diabetes an uterine cancer.  EXAM: CT HEAD WITHOUT CONTRAST  TECHNIQUE: Contiguous axial images were obtained from the base of the skull through the vertex without intravenous contrast.  COMPARISON:  CT head report dated Oct 27, 2012 though images are not available for direct comparison.  FINDINGS: The ventricles and sulci are normal for age. No intraparenchymal hemorrhage, mass effect nor midline shift. Patchy supratentorial white matter hypodensities are less than expected for patient's age and though non-specific suggest sequelae of chronic small vessel ischemic disease. No acute large vascular territory infarcts. Tiny hypodensities in the bilateral basal ganglia.  No abnormal extra-axial fluid collections. Basal cisterns are patent. Moderate calcific atherosclerosis of the carotid siphons.  No skull fracture. The included ocular globes and orbital contents are non-suspicious. Bilateral ocular lens implants. The mastoid aircells and included paranasal sinuses are well-aerated.  IMPRESSION: No acute intracranial process  Involutional changes. Bilateral basal ganglia perivascular spaces and/or lacunar infarcts.  Mild  to moderate white matter changes most consistent chronic small vessel ischemic disease.   Electronically Signed   By: Elon Alas M.D.   On: 11/18/2014 00:37   Dg Chest Portable 1 View  12/12/2014   CLINICAL DATA:  Vomiting today.  States for uterine cancer.  EXAM: PORTABLE CHEST - 1 VIEW  COMPARISON:  PA and lateral chest 11/17/2014.  FINDINGS: 1.4 cm nodule projecting in the right mid lung is unchanged. The lungs are otherwise clear. Heart size is normal. Calcified mediastinal lymph nodes are noted.  IMPRESSION: No acute disease.  No change in a 1.4 cm in pulmonary nodule on the right.   Electronically Signed   By: Inge Rise M.D.   On: 12/12/2014 21:39     CBC  Recent Labs Lab 12/12/14 1955 12/13/14 0740 12/13/14 1900 12/14/14 0624  WBC 16.6* 11.4* 10.0 10.5  HGB 3.0* 11.8* 12.1 12.1  HCT 10.0* 36.0 36.6 37.3  PLT 413 285 285 275  MCV 86.9 82.7 82.6 83.2  MCH 25.6* 27.0 27.2 26.9  MCHC 29.5* 32.6 32.9 32.3  RDW 19.9* 16.7* 17.3* 17.6*  LYMPHSABS 3.1  --  2.9  --   MONOABS 0.7  --  0.7  --   EOSABS 0.0  --  0.3  --   BASOSABS 0.1  --  0.1  --     Chemistries   Recent Labs Lab 12/12/14 1955 12/13/14 0740 12/13/14 1845 12/14/14 0624  NA 132* 142 141 142  K 3.9  3.5 3.5 3.3*  CL 101 114* 115* 114*  CO2 17* 21* 20* 20*  GLUCOSE 894* 160* 127* 158*  BUN 38* 26* 22* 22*  CREATININE 1.47* 1.08* 1.15* 1.22*  CALCIUM 7.8* 8.2* 7.7* 7.8*  AST 18  --   --   --   ALT 10*  --   --   --   ALKPHOS 43  --   --   --   BILITOT 0.4  --   --   --    ------------------------------------------------------------------------------------------------------------------ estimated creatinine clearance is 33.5 mL/min (by C-G formula based on Cr of 1.22). ------------------------------------------------------------------------------------------------------------------ No results for input(s): HGBA1C in the last 72  hours. ------------------------------------------------------------------------------------------------------------------ No results for input(s): CHOL, HDL, LDLCALC, TRIG, CHOLHDL, LDLDIRECT in the last 72 hours. ------------------------------------------------------------------------------------------------------------------ No results for input(s): TSH, T4TOTAL, T3FREE, THYROIDAB in the last 72 hours.  Invalid input(s): FREET3 ------------------------------------------------------------------------------------------------------------------ No results for input(s): VITAMINB12, FOLATE, FERRITIN, TIBC, IRON, RETICCTPCT in the last 72 hours.  Coagulation profile  Recent Labs Lab 12/12/14 1955 12/14/14 0625  INR 1.37 2.11    No results for input(s): DDIMER in the last 72 hours.  Cardiac Enzymes  Recent Labs Lab 12/12/14 1955  TROPONINI <0.03   ------------------------------------------------------------------------------------------------------------------ Invalid input(s): POCBNP    Assessment & Plan   1.  Anemia possible secondary  to GI bleed. Status post transfusion continue Protonix drip EGD later today.  2. GI bleed- await EGD results 3. Uncontrolled diabetes mellitus type 2 with severe hyperglycemia, no DKA. On insulin 4. Leukocytosis, due to gram-negative sepsis, stage II decubitus ulcer sacral region. Continue aggressive IV anabiotic's with Zosyn DC Vanco  5. Metastatic uterine cancer, currently not under any treatment. 6. Hypertension, stable continue atenolol and clonidine 7. Hyperlipidemia, continue pravastatin 8. Code DO NOT RESUSCITATE appreciated palliative care input     Code Status Orders        Start     Ordered   12/13/14 0056   DNR   12/13/14 0055    Advance Directive Documentation        Most Recent Value   Type of Advance Directive  Healthcare Power of Attorney   Pre-existing out of facility DNR order (yellow form or pink MOST form)      "MOST" Form in Place?             Consults  GI DVT Prophylaxis  SCDs and let a GI bleed  Lab Results  Component Value Date   PLT 275 12/14/2014     Time Spent in minutes  89min   Greater than 50% of time spent in care coordination and counseling.   [name] M.D on 12/14/2014 at 11:50 AM  Between 7am to 6pm - Pager - [phone]  After 6pm go to [email or web address] - password [email or web address] [facility]   Office  [phone]

## 2014-12-14 NOTE — Consult Note (Signed)
  EGD only showed mild gastropathy involving antrum. Biopsies taken. No active bleeding anywhere. Able to locate previous endoscopies. EGD in 2009 showed gastropathy, but biopsies were negative. Colonoscopy also done in 2009 only showed few rectosigmoid junction polyps, which proved to be hyperplastic only. Would resume clear liquid diet and advance as tolerated. Continue to hold coumadin. Repeat colonoscopy only if pt shows significant bleeding again. I will be out next week. Dr. Rayann Heman will see pt rest of this weekend. Thanks.

## 2014-12-14 NOTE — Progress Notes (Signed)
ANTIBIOTIC CONSULT NOTE - INITIAL  Pharmacy Consult for Vancomycin/Zosyn Indication: rule out sepsis  Allergies  Allergen Reactions  . Contrast Media [Iodinated Diagnostic Agents]     Patient Measurements: Height: 5' (152.4 cm) Weight: 152 lb 12.5 oz (69.3 kg) IBW/kg (Calculated) : 45.5 Adjusted Body Weight: 54.5 kg  Vital Signs: Temp: 98.8 F (37.1 C) (07/08 0600) Temp Source: Oral (07/08 0600) BP: 155/45 mmHg (07/08 0600) Pulse Rate: 63 (07/08 0800) Intake/Output from previous day: 07/07 0701 - 07/08 0700 In: 381.2 [I.V.:281.2; IV Piggyback:100] Out: 976 [Urine:975; Stool:1] Intake/Output from this shift: Total I/O In: 50 [IV Piggyback:50] Out: -   Labs:  Recent Labs  12/13/14 0740 12/13/14 1845 12/13/14 1900 12/14/14 0624  WBC 11.4*  --  10.0 10.5  HGB 11.8*  --  12.1 12.1  PLT 285  --  285 275  CREATININE 1.08* 1.15*  --  1.22*   Estimated Creatinine Clearance: 33.5 mL/min (by C-G formula based on Cr of 1.22). No results for input(s): VANCOTROUGH, VANCOPEAK, VANCORANDOM, GENTTROUGH, GENTPEAK, GENTRANDOM, TOBRATROUGH, TOBRAPEAK, TOBRARND, AMIKACINPEAK, AMIKACINTROU, AMIKACIN in the last 72 hours.   Microbiology: Recent Results (from the past 720 hour(s))  Urine culture     Status: None   Collection Time: 11/18/14  1:34 AM  Result Value Ref Range Status   Specimen Description URINE, CLEAN CATCH  Final   Special Requests Normal  Final   Culture   Final    50,000 COLONIES/mL CITROBACTER SPECIES >=100,000 COLONIES/mL STREPTOCOCCUS AGALACTIAE Virtually 100% of S. agalactiae (Group B) strains are susceptible to Penicillin.  For Penicillin-allergic patients, Erythromycin (85-95% sensitive) and Clindamycin (80% sensitive) are drugs of choice. Contact microbiology lab to request sensitivities if  needed within 7 days.    Report Status 11/20/2014 FINAL  Final   Organism ID, Bacteria CITROBACTER SPECIES  Final      Susceptibility   Citrobacter species - MIC*   CEFTAZIDIME <=1 SENSITIVE Sensitive     CEFAZOLIN >=64 RESISTANT Resistant     CEFTRIAXONE <=1 SENSITIVE Sensitive     CIPROFLOXACIN <=0.25 SENSITIVE Sensitive     GENTAMICIN <=1 SENSITIVE Sensitive     IMIPENEM <=0.25 SENSITIVE Sensitive     TRIMETH/SULFA <=20 SENSITIVE Sensitive     CEFOXITIN 32 RESISTANT Resistant     * 50,000 COLONIES/mL CITROBACTER SPECIES  Blood culture (routine x 2)     Status: None (Preliminary result)   Collection Time: 12/12/14  7:35 PM  Result Value Ref Range Status   Specimen Description BLOOD  Final   Special Requests Normal  Final   Culture NO GROWTH 2 DAYS  Final   Report Status PENDING  Incomplete  Blood culture (routine x 2)     Status: None (Preliminary result)   Collection Time: 12/12/14  8:18 PM  Result Value Ref Range Status   Specimen Description BLOOD RESISTANT HEEL OF FOOT  Final   Special Requests Normal  Final   Culture NO GROWTH 2 DAYS  Final   Report Status PENDING  Incomplete  MRSA PCR Screening     Status: Abnormal   Collection Time: 12/13/14  1:30 AM  Result Value Ref Range Status   MRSA by PCR POSITIVE (A) NEGATIVE Final    Comment:        The GeneXpert MRSA Assay (FDA approved for NASAL specimens only), is one component of a comprehensive MRSA colonization surveillance program. It is not intended to diagnose MRSA infection nor to guide or monitor treatment for MRSA infections. CRITICAL RESULT CALLED  TO, READ BACK BY AND VERIFIED WITH: RENEE BABB @0347  12/13/14 BY AJO     Medical History: Past Medical History  Diagnosis Date  . Diabetes mellitus without complication   . C. difficile diarrhea   . Uterine cancer   . Hypogammaglobulinemia   . Pulmonary embolism   . Decubitus ulcers   . Hypertension   . Hypercholesterolemia   . Incontinence of urine   . Cancer     Endometrial Cancer  . Cervical cancer     with radiation 45 years ago...  . Arthritis   . GERD (gastroesophageal reflux disease)   . Bladder cancer   .  Decubitus ulcer of buttock, stage 1     Medications:  Scheduled:  . amLODipine  5 mg Oral Daily  . atenolol  50 mg Oral Daily  . benazepril  40 mg Oral Daily  . Chlorhexidine Gluconate Cloth  6 each Topical Q0600  . cloNIDine  0.2 mg Oral BID  . collagenase   Topical Daily  . dextrose  25 mL Intravenous Once  . furosemide  20 mg Oral QODAY  . hydrALAZINE  100 mg Oral BID  . insulin aspart  2-6 Units Subcutaneous 6 times per day  . insulin glargine  10 Units Subcutaneous Q24H  . mirtazapine  15 mg Oral Daily  . mupirocin ointment  1 application Nasal BID  . piperacillin-tazobactam (ZOSYN)  IV  3.375 g Intravenous 3 times per day  . potassium chloride  10 mEq Intravenous Once  . pravastatin  10 mg Oral q1800  . tamsulosin  0.4 mg Oral BID  . vancomycin  750 mg Intravenous Q24H   Infusions:  . sodium chloride    . insulin (NOVOLIN-R) infusion 1 mL/hr at 12/13/14 1158  . pantoprozole (PROTONIX) infusion 8 mg/hr (12/14/14 0800)   PRN:   Assessment: Patient admitted with leukocytosis, possible sepsis, UTI, decubitus ulcer ordered empiric abx.   Goal of Therapy:  Vancomycin trough level 15-20 mcg/ml  Plan:  1. Vancomycin 750 mg IV q24 hours.   2. Zosyn 3.375 g EI q 8 h.   Tysen Roesler D 12/14/2014,9:22 AM

## 2014-12-14 NOTE — Progress Notes (Signed)
Pt resting comfortable.pt moans aloud but refuses pain meds, states she is in no pain. Pt NPO after midnight for EGD patient aware of procedure and signed consent . Pt sister [name], also notified and was aware of procedure.

## 2014-12-14 NOTE — Transfer of Care (Signed)
Immediate Anesthesia Transfer of Care Note  Patient: Shelley Fields  Procedure(s) Performed: Procedure(s): ESOPHAGOGASTRODUODENOSCOPY (EGD) (N/A)  Patient Location: PACU  Anesthesia Type:General  Level of Consciousness: awake, alert  and oriented  Airway & Oxygen Therapy: Patient Spontanous Breathing and Patient connected to nasal cannula oxygen  Post-op Assessment: Report given to RN and Post -op Vital signs reviewed and stable  Post vital signs: Reviewed and stable  Last Vitals:  Filed Vitals:   12/14/14 1432  BP: 118/46  Pulse: 90  Temp: 36.9 C  Resp: 21    Complications: No apparent anesthesia complications

## 2014-12-14 NOTE — Care Management (Signed)
Important Message  Patient Details  Name: AZULA ZAPPIA MRN: [id number] Date of Birth: 22-Sep-1935   Medicare Important Message Given:  Yes-second notification given    Juliann Pulse A Allmond 12/14/2014, 1:12 PM

## 2014-12-14 NOTE — Consult Note (Signed)
Palliative Medicine Inpatient Consult Note   Name: Shelley Fields Date: 12/14/2014 MRN: [id number]  DOB: Oct 07, 1935  Referring Physician: [name], MD  Palliative Care consult requested for this 79 y.o. female for goals of medical therapy in patient with  metastatic uterine cancer, a GI bleed, and a UTI.  She had been at [facility] and has also had Home Health recently. She may be a Hospice candidate, but the first issue to address is code status.  She was able to answer questions about her condition and she was able to tell me that she would not want to be resuscitated if she was 'gone'.  She is to have upper endoscopy tomorrow and after that, further discussions can be had about a comfort or possible palliative approach of care for her.  She has a long list of other complications and additional illnesses that indicate that she would be an appropriate candidate for Hospice, if no further aggressive chemo treatments are indicated. Her performance status seems quite low at this time and Dr Kem Parkinson note indicates that this is what limits treatment options for her.    REVIEW OF SYSTEMS: Positive for malaise, fatigue, nausea, vomiting, blood in stool, some wt gain on megace and some occasional abdominal pain and diarrhea.  Negative for fever, chills, stiff neck, headaches, seizures, dysuria, frequency, skin bleeding, depression.    SPIRITUAL SUPPORT SYSTEM: Unknown .  SOCIAL HISTORY:  reports that she has never smoked. She does not have any smokeless tobacco history on file.  LEGAL DOCUMENTS:  Sister is HCPOA  CODE STATUS: Full code  PAST MEDICAL HISTORY: Past Medical History  Diagnosis Date  . Diabetes mellitus without complication   . C. difficile diarrhea   . Uterine cancer   . Hypogammaglobulinemia   . Pulmonary embolism   . Decubitus ulcers   . Hypertension   . Hypercholesterolemia   . Incontinence of urine   . Cancer     Endometrial Cancer  . Cervical cancer     with  radiation 45 years ago...  . Arthritis   . GERD (gastroesophageal reflux disease)   . Bladder cancer   . Decubitus ulcer of buttock, stage 1     PAST SURGICAL HISTORY:  Past Surgical History  Procedure Laterality Date  . Total abdominal hysterectomy w/ bilateral salpingoophorectomy  07/29/2010  . Stent in leg  2016  . Cataract extraction, bilateral    . Bladder surgery    . Femoral endarterectomy      right  . Leg surgery      right leg  . Portacath placement      right    ALLERGIES:  is allergic to contrast media.  MEDICATIONS:  Current Facility-Administered Medications  Medication Dose Route Frequency Provider Last Rate Last Dose  . amLODipine (NORVASC) tablet 5 mg  5 mg Oral Daily Juluis Mire, MD   5 mg at 12/14/14 0953  . atenolol (TENORMIN) tablet 50 mg  50 mg Oral Daily Juluis Mire, MD   50 mg at 12/14/14 0953  . benazepril (LOTENSIN) tablet 40 mg  40 mg Oral Daily Juluis Mire, MD   40 mg at 12/13/14 0926  . Chlorhexidine Gluconate Cloth 2 % PADS 6 each  6 each Topical Q0600 [name], MD   6 each at 12/14/14 [phone]  . cloNIDine (CATAPRES) tablet 0.2 mg  0.2 mg Oral BID Juluis Mire, MD   0.2 mg at 12/14/14 2138  . collagenase (SANTYL) ointment  Topical Daily [name], MD      . dextrose 50 % solution 25 mL  25 mL Intravenous Once [name], MD      . feeding supplement (RESOURCE BREEZE) (RESOURCE BREEZE) liquid 1 Container  1 Container Oral TID WC [name], MD      . furosemide (LASIX) tablet 20 mg  20 mg Oral QODAY Juluis Mire, MD   20 mg at 12/13/14 8295  . hydrALAZINE (APRESOLINE) tablet 100 mg  100 mg Oral BID Juluis Mire, MD   100 mg at 12/14/14 2137  . insulin aspart (novoLOG) injection 2-6 Units  2-6 Units Subcutaneous 6 times per day [name], MD   4 Units at 12/14/14 1645  . insulin glargine (LANTUS) injection 10 Units  10 Units Subcutaneous Q24H [name], MD   10 Units at 12/13/14 1201  . insulin  regular (NOVOLIN R,HUMULIN R) 250 Units in sodium chloride 0.9 % 250 mL (1 Units/mL) infusion   Intravenous Continuous Joanne Gavel, MD 1 mL/hr at 12/13/14 1158    . mirtazapine (REMERON) tablet 15 mg  15 mg Oral Daily Juluis Mire, MD   15 mg at 12/14/14 0953  . morphine 2 MG/ML injection 1 mg  1 mg Intravenous Q3H PRN [name], MD   1 mg at 12/14/14 1131  . mupirocin ointment (BACTROBAN) 2 % 1 application  1 application Nasal BID [name], MD   1 application at 62/13/08 2138  . ondansetron (ZOFRAN) injection 4 mg  4 mg Intravenous Q6H PRN [name], MD   4 mg at 12/14/14 1409  . pantoprazole (PROTONIX) EC tablet 40 mg  40 mg Oral BID [name], MD   40 mg at 12/14/14 2138  . piperacillin-tazobactam (ZOSYN) IVPB 3.375 g  3.375 g Intravenous 3 times per day Juluis Mire, MD   3.375 g at 12/14/14 2148  . pravastatin (PRAVACHOL) tablet 10 mg  10 mg Oral q1800 Juluis Mire, MD   10 mg at 12/13/14 1843  . tamsulosin (FLOMAX) capsule 0.4 mg  0.4 mg Oral BID Alexander Mt, MD   0.4 mg at 12/14/14 2137   Facility-Administered Medications Ordered in Other Encounters  Medication Dose Route Frequency Provider Last Rate Last Dose  . sodium chloride 0.9 % injection 10 mL  10 mL Intravenous PRN Evlyn Kanner, NP   10 mL at 11/02/14 0904    Vital Signs: BP 111/64 mmHg  Pulse 73  Temp(Src) 98.4 F (36.9 C) (Oral)  Resp 23  Ht 5' (1.524 m)  Wt 69.3 kg (152 lb 12.5 oz)  BMI 29.84 kg/m2  SpO2 97% Filed Weights   12/12/14 1922 12/13/14 0100  Weight: 68.04 kg (150 lb) 69.3 kg (152 lb 12.5 oz)    Estimated body mass index is 29.84 kg/(m^2) as calculated from the following:   Height as of this encounter: 5' (1.524 m).   Weight as of this encounter: 69.3 kg (152 lb 12.5 oz).  PERFORMANCE STATUS (ECOG) : 3 - Symptomatic, >50% confined to bed --  PHYSICAL EXAM: NAD but weak appearing and pale Sleepy but wakens EOMI OP clear Neck wo JVD or TM Heart rrr no  mgr Lungs cta Abd soft and nontender Ext no c/c/e She has a stage ii skin decubitus over sacrum     LABS: CBC:    Component Value Date/Time   WBC 10.5 12/14/2014 0624   WBC 9.7 10/03/2014 1030   HGB 12.1 12/14/2014 0624  HGB 9.5* 10/03/2014 1030   HCT 37.3 12/14/2014 0624   HCT 30.7* 10/03/2014 1030   PLT 275 12/14/2014 0624   PLT 325 10/03/2014 1030   MCV 83.2 12/14/2014 0624   MCV 81 10/03/2014 1030   NEUTROABS 6.1 12/13/2014 1900   NEUTROABS 6.5 10/03/2014 1030   LYMPHSABS 2.9 12/13/2014 1900   LYMPHSABS 2.2 10/03/2014 1030   MONOABS 0.7 12/13/2014 1900   MONOABS 0.7 10/03/2014 1030   EOSABS 0.3 12/13/2014 1900   EOSABS 0.2 10/03/2014 1030   BASOSABS 0.1 12/13/2014 1900   BASOSABS 0.1 10/03/2014 1030   Comprehensive Metabolic Panel:    Component Value Date/Time   NA 142 12/14/2014 0624   NA 136 07/13/2014 1022   K 3.3* 12/14/2014 0624   K 4.8 07/13/2014 1022   CL 114* 12/14/2014 0624   CL 101 07/13/2014 1022   CO2 20* 12/14/2014 0624   CO2 26 07/13/2014 1022   BUN 22* 12/14/2014 0624   BUN 27* 09/04/2014 1113   CREATININE 1.22* 12/14/2014 0624   CREATININE 1.20* 10/03/2014 1030   GLUCOSE 158* 12/14/2014 0624   GLUCOSE 218* 07/13/2014 1022   CALCIUM 7.8* 12/14/2014 0624   CALCIUM 8.2* 07/13/2014 1022   AST 18 12/12/2014 1955   AST 17 06/13/2014 1410   ALT 10* 12/12/2014 1955   ALT 10* 06/13/2014 1410   ALKPHOS 43 12/12/2014 1955   ALKPHOS 50 06/13/2014 1410   BILITOT 0.4 12/12/2014 1955   PROT 6.7 12/12/2014 1955   PROT 7.3 06/13/2014 1410   ALBUMIN 2.5* 12/12/2014 1955   ALBUMIN 2.4* 06/13/2014 1410    IMPRESSION: Metastatic uterine cancer, currently not under any treatment. Severe anemia secondary to GI bleed--for upper endo tomorrow Uncontrolled diabetes mellitus type 2 with severe hyperglycemia, no DKA, but treated with insulin drip Leukocytosis, likely sepsis-UTI, Stage II decubitus ulcer sacral region. Hypertension, stable on home  medications. Hyperlipidemia, stable on home medications. Chronic coumadin use for PE in 2012 History of recurrent CDiff infections H/IO candidal skin infections H/O chronic / recurrent 'diaper rash' (as she sits in her urine per note from Dr Mike Gip) Weakness and poor performance status  PLAN: DNR based on patients comments  Will need more time to discuss further issues such as possible Hospice care and in what setting. Sister has made it known that she cannot take care of patient in the home so if not Hospice Home appropriate, she would need SNF/ facility.   Daughter lives in  [place] [phone]) --she has been here recently but is to return or has already returned home Sister is in town but cannot any longer care for pt.    REFERRALS TO BE ORDERED:  Consider Hospice Referral after endoscopy and after further discussion   More than 50% of the visit was spent in counseling/coordination of care: Yes  Time Spent: 70 minutes

## 2014-12-15 LAB — URINE CULTURE: Culture: >=100000

## 2014-12-15 LAB — CBC
HCT: 37 % (ref 35.0–47.0)
Hemoglobin: 11.9 g/dL — ABNORMAL LOW (ref 12.0–16.0)
MCH: 27.2 pg (ref 26.0–34.0)
MCHC: 32.2 g/dL (ref 32.0–36.0)
MCV: 84.5 fL (ref 80.0–100.0)
PLATELETS: 260 10*3/uL (ref 150–440)
RBC: 4.38 MIL/uL (ref 3.80–5.20)
RDW: 18.1 % — ABNORMAL HIGH (ref 11.5–14.5)
WBC: 11.2 10*3/uL — AB (ref 3.6–11.0)

## 2014-12-15 LAB — GLUCOSE, CAPILLARY
GLUCOSE-CAPILLARY: 205 mg/dL — AB (ref 65–99)
GLUCOSE-CAPILLARY: 321 mg/dL — AB (ref 65–99)
Glucose-Capillary: 236 mg/dL — ABNORMAL HIGH (ref 65–99)
Glucose-Capillary: 201 mg/dL — ABNORMAL HIGH (ref 65–99)
Glucose-Capillary: 278 mg/dL — ABNORMAL HIGH (ref 65–99)

## 2014-12-15 LAB — BASIC METABOLIC PANEL
Anion gap: 10 (ref 5–15)
BUN: 22 mg/dL — ABNORMAL HIGH (ref 6–20)
CO2: 17 mmol/L — ABNORMAL LOW (ref 22–32)
Calcium: 7.4 mg/dL — ABNORMAL LOW (ref 8.9–10.3)
Chloride: 109 mmol/L (ref 101–111)
Creatinine, Ser: 1.34 mg/dL — ABNORMAL HIGH (ref 0.44–1.00)
GFR, EST AFRICAN AMERICAN: 43 mL/min — AB (ref >60)
GFR, EST NON AFRICAN AMERICAN: 37 mL/min — AB (ref >60)
Glucose, Bld: 220 mg/dL — ABNORMAL HIGH (ref 65–99)
Potassium: 3.8 mmol/L (ref 3.5–5.1)
Sodium: 136 mmol/L (ref 135–145)

## 2014-12-15 LAB — PHOSPHORUS: Phosphorus: 3.1 mg/dL (ref 2.5–4.6)

## 2014-12-15 LAB — MAGNESIUM: Magnesium: 1.2 mg/dL — ABNORMAL LOW (ref 1.7–2.4)

## 2014-12-15 LAB — CREATININE, SERUM
Creatinine, Ser: 1.39 mg/dL — ABNORMAL HIGH (ref 0.44–1.00)
GFR calc non Af Amer: 36 mL/min — ABNORMAL LOW (ref >60)
GFR, EST AFRICAN AMERICAN: 41 mL/min — AB (ref >60)

## 2014-12-15 LAB — PROTIME-INR
INR: 2.79
PROTHROMBIN TIME: 29.5 s — AB (ref 11.4–15.0)

## 2014-12-15 MED ORDER — INSULIN ASPART 100 UNIT/ML ~~LOC~~ SOLN: 0.0000 [IU] | Freq: Three times a day (TID) | SUBCUTANEOUS | Status: DC

## 2014-12-15 MED ORDER — INSULIN ASPART 100 UNIT/ML ~~LOC~~ SOLN
0.0000 [IU] | Freq: Three times a day (TID) | SUBCUTANEOUS | Status: DC
  Administered 2014-12-15: 18:00:00 3 [IU] via SUBCUTANEOUS
  Administered 2014-12-15: 01:00:00 7 [IU] via SUBCUTANEOUS
  Administered 2014-12-15: 3 [IU] via SUBCUTANEOUS
  Administered 2014-12-15: 23:00:00 5 [IU] via SUBCUTANEOUS
  Administered 2014-12-15: 09:00:00 3 [IU] via SUBCUTANEOUS
  Administered 2014-12-16: 12:00:00 7 [IU] via SUBCUTANEOUS
  Administered 2014-12-16 (×2): 5 [IU] via SUBCUTANEOUS
  Administered 2014-12-16: 9 [IU] via SUBCUTANEOUS
  Administered 2014-12-17 (×2): 3 [IU] via SUBCUTANEOUS
  Filled 2014-12-15: qty 5
  Filled 2014-12-15 (×2): qty 3
  Filled 2014-12-15: qty 5
  Filled 2014-12-15: qty 7
  Filled 2014-12-15 (×2): qty 3
  Filled 2014-12-15: qty 9
  Filled 2014-12-15: qty 3
  Filled 2014-12-15: qty 5
  Filled 2014-12-15: qty 7

## 2014-12-15 MED ORDER — CEFTRIAXONE SODIUM IN DEXTROSE 20 MG/ML IV SOLN
1.0000 g | INTRAVENOUS | Status: DC
  Administered 2014-12-15 – 2014-12-16 (×2): 1 g via INTRAVENOUS
  Filled 2014-12-15 (×3): qty 50

## 2014-12-15 MED ORDER — MAGNESIUM SULFATE 4 GM/100ML IV SOLN
4.0000 g | Freq: Once | INTRAVENOUS | Status: AC
  Administered 2014-12-15: 13:00:00 4 g via INTRAVENOUS
  Filled 2014-12-15: qty 100

## 2014-12-15 NOTE — Progress Notes (Signed)
,   GI Inpatient Follow-up Note  Patient Identification: Shelley Fields is a 79 y.o. female with n/v, sepsis, possible UGI bleed with gastritis on EGD.   Subjective:  Denies any blood in stool or black stool.  No abd pain.  No f/c.    Scheduled Inpatient Medications:  . amLODipine  5 mg Oral Daily  . atenolol  50 mg Oral Daily  . benazepril  40 mg Oral Daily  . cefTRIAXone (ROCEPHIN)  IV  1 g Intravenous Q24H  . Chlorhexidine Gluconate Cloth  6 each Topical Q0600  . cloNIDine  0.2 mg Oral BID  . collagenase   Topical Daily  . dextrose  25 mL Intravenous Once  . feeding supplement (RESOURCE BREEZE)  1 Container Oral TID WC  . furosemide  20 mg Oral QODAY  . hydrALAZINE  100 mg Oral BID  . insulin aspart  0-9 Units Subcutaneous TID AC & HS  . mirtazapine  15 mg Oral Daily  . mupirocin ointment  1 application Nasal BID  . pantoprazole  40 mg Oral BID  . pravastatin  10 mg Oral q1800  . tamsulosin  0.4 mg Oral BID    Continuous Inpatient Infusions:     PRN Inpatient Medications:  morphine injection, ondansetron (ZOFRAN) IV    Physical Examination: BP 152/43 mmHg  Pulse 62  Temp(Src) 98.8 F (37.1 C) (Oral)  Resp 20  Ht 5' (1.524 m)  Wt 68.04 kg (150 lb)  BMI 29.30 kg/m2  SpO2 100% Gen: NAD, alert and oriented x 3, sleepy  Neck: supple, no JVD or thyromegaly Chest: CTA bilaterally, no wheezes, crackles, or other adventitious sounds CV: RRR, no m/g/c/r Abd: soft, + diffuse TTP, nabs in all four quadrants; no HSM, guarding, ridigity, or rebound tenderness Ext: no edema, well perfused with 2+ pulses,    Data: Lab Results  Component Value Date   WBC 11.2* 12/15/2014   HGB 11.9* 12/15/2014   HCT 37.0 12/15/2014   MCV 84.5 12/15/2014   PLT 260 12/15/2014    Recent Labs Lab 12/13/14 1900 12/14/14 0624 12/15/14 0522  HGB 12.1 12.1 11.9*   Lab Results  Component Value Date   NA 136 12/15/2014   K 3.8 12/15/2014   CL 109 12/15/2014   CO2 17* 12/15/2014   BUN 22* 12/15/2014   CREATININE 1.34* 12/15/2014   Lab Results  Component Value Date   ALT 10* 12/12/2014   AST 18 12/12/2014   ALKPHOS 43 12/12/2014   BILITOT 0.4 12/12/2014    Recent Labs Lab 12/12/14 1955  12/15/14 0514  APTT 23*  --   --   INR 1.37  < > 2.79  < > = values in this interval not displayed.   Assessment/Plan:  N/V improved. Hgb stable. No bleeding.    Recommendations: - ok to restart coumadin - no addl recs currently.   Please call with questions or concerns.  Corrinne Benegas, Grace Blight, MD

## 2014-12-15 NOTE — Progress Notes (Signed)
MEDICATION RELATED CONSULT NOTE - INITIAL   Pharmacy Consult for Electrolyte replacement Indication: electrolyte  Allergies  Allergen Reactions  . Contrast Media [Iodinated Diagnostic Agents]     Patient Measurements: Height: 5' (152.4 cm) Weight: 150 lb (68.04 kg) IBW/kg (Calculated) : 45.5 Adjusted Body Weight:   Vital Signs: Temp: 98.8 F (37.1 C) (07/09 1327) Temp Source: Oral (07/09 1327) BP: 157/43 mmHg (07/09 1327) Pulse Rate: 70 (07/09 1327) Intake/Output from previous day: 07/08 0701 - 07/09 0700 In: 650 [I.V.:200; IV Piggyback:450] Out: 925 [Urine:925] Intake/Output from this shift:    Labs:  Recent Labs  12/12/14 1955  12/13/14 1900 12/14/14 0624 12/15/14 0514 12/15/14 0522  WBC 16.6*  < > 10.0 10.5  --  11.2*  HGB 3.0*  < > 12.1 12.1  --  11.9*  HCT 10.0*  < > 36.6 37.3  --  37.0  PLT 413  < > 285 275  --  260  APTT 23*  --   --   --   --   --   CREATININE 1.47*  < >  --  1.22* 1.39* 1.34*  MG  --   --   --   --  1.2*  --   PHOS  --   --   --   --  3.1  --   ALBUMIN 2.5*  --   --   --   --   --   PROT 6.7  --   --   --   --   --   AST 18  --   --   --   --   --   ALT 10*  --   --   --   --   --   ALKPHOS 43  --   --   --   --   --   BILITOT 0.4  --   --   --   --   --   < > = values in this interval not displayed. Estimated Creatinine Clearance: 30.2 mL/min (by C-Fields formula based on Cr of 1.34).   Microbiology: Recent Results (from the past 720 hour(s))  Urine culture     Status: None   Collection Time: 11/18/14  1:34 AM  Result Value Ref Range Status   Specimen Description URINE, CLEAN CATCH  Final   Special Requests Normal  Final   Culture   Final    50,000 COLONIES/mL CITROBACTER SPECIES >=100,000 COLONIES/mL STREPTOCOCCUS AGALACTIAE Virtually 100% of S. agalactiae (Group B) strains are susceptible to Penicillin.  For Penicillin-allergic patients, Erythromycin (85-95% sensitive) and Clindamycin (80% sensitive) are drugs of choice.  Contact microbiology lab to request sensitivities if  needed within 7 days.    Report Status 11/20/2014 FINAL  Final   Organism ID, Bacteria CITROBACTER SPECIES  Final      Susceptibility   Citrobacter species - MIC*    CEFTAZIDIME <=1 SENSITIVE Sensitive     CEFAZOLIN >=64 RESISTANT Resistant     CEFTRIAXONE <=1 SENSITIVE Sensitive     CIPROFLOXACIN <=0.25 SENSITIVE Sensitive     GENTAMICIN <=1 SENSITIVE Sensitive     IMIPENEM <=0.25 SENSITIVE Sensitive     TRIMETH/SULFA <=20 SENSITIVE Sensitive     CEFOXITIN 32 RESISTANT Resistant     * 50,000 COLONIES/mL CITROBACTER SPECIES  Blood culture (routine x 2)     Status: None (Preliminary result)   Collection Time: 12/12/14  7:35 PM  Result Value Ref Range Status   Specimen Description  BLOOD  Final   Special Requests Normal  Final   Culture NO GROWTH 3 DAYS  Final   Report Status PENDING  Incomplete  Blood culture (routine x 2)     Status: None (Preliminary result)   Collection Time: 12/12/14  8:18 PM  Result Value Ref Range Status   Specimen Description BLOOD RESISTANT HEEL OF FOOT  Final   Special Requests Normal  Final   Culture NO GROWTH 3 DAYS  Final   Report Status PENDING  Incomplete  Urine culture     Status: None   Collection Time: 12/13/14  1:20 AM  Result Value Ref Range Status   Specimen Description URINE, CATHETERIZED  Final   Special Requests Immunocompromised  Final   Culture >=100,000 COLONIES/mL KLEBSIELLA PNEUMONIAE  Final   Report Status 12/15/2014 FINAL  Final   Organism ID, Bacteria KLEBSIELLA PNEUMONIAE  Final      Susceptibility   Klebsiella pneumoniae - MIC*    AMPICILLIN 8 RESISTANT Resistant     CEFTAZIDIME <=1 SENSITIVE Sensitive     CEFAZOLIN <=4 SENSITIVE Sensitive     CEFTRIAXONE <=1 SENSITIVE Sensitive     CIPROFLOXACIN <=0.25 SENSITIVE Sensitive     GENTAMICIN <=1 SENSITIVE Sensitive     IMIPENEM <=0.25 SENSITIVE Sensitive     TRIMETH/SULFA <=20 SENSITIVE Sensitive     * >=100,000  COLONIES/mL KLEBSIELLA PNEUMONIAE  MRSA PCR Screening     Status: Abnormal   Collection Time: 12/13/14  1:30 AM  Result Value Ref Range Status   MRSA by PCR POSITIVE (A) NEGATIVE Final    Comment:        The GeneXpert MRSA Assay (FDA approved for NASAL specimens only), is one component of a comprehensive MRSA colonization surveillance program. It is not intended to diagnose MRSA infection nor to guide or monitor treatment for MRSA infections. CRITICAL RESULT CALLED TO, READ BACK BY AND VERIFIED WITH: RENEE BABB @0347  12/13/14 BY AJO     Medical History: Past Medical History  Diagnosis Date  . Diabetes mellitus without complication   . C. difficile diarrhea   . Uterine cancer   . Hypogammaglobulinemia   . Pulmonary embolism   . Decubitus ulcers   . Hypertension   . Hypercholesterolemia   . Incontinence of urine   . Cancer     Endometrial Cancer  . Cervical cancer     with radiation 45 years ago...  . Arthritis   . GERD (gastroesophageal reflux disease)   . Bladder cancer   . Decubitus ulcer of buttock, stage 1     Medications:  Scheduled:  . amLODipine  5 mg Oral Daily  . atenolol  50 mg Oral Daily  . benazepril  40 mg Oral Daily  . Chlorhexidine Gluconate Cloth  6 each Topical Q0600  . cloNIDine  0.2 mg Oral BID  . collagenase   Topical Daily  . dextrose  25 mL Intravenous Once  . feeding supplement (RESOURCE BREEZE)  1 Container Oral TID WC  . furosemide  20 mg Oral QODAY  . hydrALAZINE  100 mg Oral BID  . insulin aspart  0-9 Units Subcutaneous TID AC & HS  . magnesium sulfate 1 - 4 Fields bolus IVPB  4 Fields Intravenous Once  . mirtazapine  15 mg Oral Daily  . mupirocin ointment  1 application Nasal BID  . pantoprazole  40 mg Oral BID  . piperacillin-tazobactam (ZOSYN)  IV  3.375 Fields Intravenous 3 times per day  . pravastatin  10 mg Oral q1800  . tamsulosin  0.4 mg Oral BID    Assessment: Patient's electrolyte are all within normal range except Magnesium of  1.2.  Goal of Therapy:  Normalize electrolytes   Plan:  Will give Magnesium 4 gm IV once bolus. Will order electrolyte panel with am labs.   Shelley Fields 12/15/2014,1:50 PM

## 2014-12-15 NOTE — Anesthesia Postprocedure Evaluation (Signed)
  Anesthesia Post-op Note  Patient: Shelley Fields  Procedure(s) Performed: Procedure(s): ESOPHAGOGASTRODUODENOSCOPY (EGD) (N/A)  Anesthesia type:General  Patient location: ICU  Post pain: Pain level controlled  Post assessment: Post-op Vital signs reviewed, Patient's Cardiovascular Status Stable, Respiratory Function Stable, Patent Airway and No signs of Nausea or vomiting  Post vital signs: Reviewed and stable  Last Vitals:  Filed Vitals:   12/15/14 0438  BP: 154/83  Pulse: 62  Temp:   Resp:     Level of consciousness: awake, alert  and patient cooperative  Complications: No apparent anesthesia complications

## 2014-12-15 NOTE — Evaluation (Signed)
Physical Therapy Evaluation Patient Details Name: Shelley Fields MRN: [id number] DOB: 1936-04-16 Today's Date: 12/15/2014   History of Present Illness  Pt here with anemia that has improved, also has uterine cancer  Clinical Impression  Pt generally not wanting to do a whole lot, but is able to sit, stand and do some minimal walking w/o needing a lot of assist.  Pt is eager to get back home and is interested in participating with some kind of PT when she gets home.  Pt generally weak (and not at  Baseline) but grossly functional.    Follow Up Recommendations Home health PT    Equipment Recommendations       Recommendations for Other Services       Precautions / Restrictions Precautions Precautions: Fall Restrictions Weight Bearing Restrictions: No      Mobility  Bed Mobility Overal bed mobility: Modified Independent             General bed mobility comments: Pt is slow and reliant on the bed rails  Transfers Overall transfer level: Modified independent Equipment used: Rolling walker (2 wheeled)             General transfer comment: CGA with rising sit to stand  Ambulation/Gait Ambulation/Gait assistance: Min guard Ambulation Distance (Feet): 5 Feet Assistive device: Rolling walker (2 wheeled)       General Gait Details: Pt able to take a few steps bed to recliner, but does not feel ready to do a lot of walking "Maybe we save that for next time"  Stairs            Wheelchair Mobility    Modified Rankin (Stroke Patients Only)       Balance                                             Pertinent Vitals/Pain Pain Assessment: No/denies pain    Home Living Family/patient expects to be discharged to:: Private residence Living Arrangements: Other (Comment) Available Help at Discharge: Family   Home Access: Ramped entrance       Home Equipment: Environmental consultant - 2 wheels      Prior Function Level of Independence: Independent with  assistive device(s)               Hand Dominance        Extremity/Trunk Assessment   Upper Extremity Assessment: Generalized weakness           Lower Extremity Assessment: Generalized weakness         Communication   Communication: No difficulties  Cognition Arousal/Alertness: Awake/alert Behavior During Therapy: WFL for tasks assessed/performed Overall Cognitive Status: Within Functional Limits for tasks assessed                      General Comments      Exercises        Assessment/Plan    PT Assessment Patient needs continued PT services  PT Diagnosis Difficulty walking;Generalized weakness   PT Problem List Decreased strength;Decreased activity tolerance;Decreased balance;Decreased mobility  PT Treatment Interventions Gait training;Functional mobility training;Therapeutic activities;Therapeutic exercise;Balance training   PT Goals (Current goals can be found in the Care Plan section) Acute Rehab PT Goals Patient Stated Goal: "I want to go back home" PT Goal Formulation: With patient Time For Goal Achievement: 12/29/14 Potential to Achieve Goals: Good  Frequency Min 2X/week   Barriers to discharge        Co-evaluation               End of Session Equipment Utilized During Treatment: Gait belt Activity Tolerance: Patient limited by fatigue Patient left: with nursing/sitter in room Nurse Communication: Mobility status         Time: 0910-0930 PT Time Calculation (min) (ACUTE ONLY): 20 min   Charges:   PT Evaluation $Initial PT Evaluation Tier I: 1 Procedure     PT G Codes:       Wayne Both, PT, DPT [phone]  Kreg Shropshire 12/15/2014, 12:53 PM

## 2014-12-15 NOTE — Plan of Care (Signed)
Problem: Discharge Progression Outcomes Goal: Other Discharge Outcomes/Goals Outcome: Progressing Pt transferred from CCU tonight. VSS. No signs of bleeding. Hgb stable. Foley intact. Denies pain/n/v.

## 2014-12-15 NOTE — Progress Notes (Signed)
[facility]                                                                                                                                                                                            Patient Demographics   Shelley Fields, is a 79 y.o. female, DOB - 1935-09-10, VZD:[id number]  Admit date - 12/12/2014   Admitting Physician Juluis Mire, MD  Outpatient Primary MD for the patient is [name], MD   LOS - 3  Subjective:  No complains, no diarrhea, tolerating liquid diet.  Review of Systems:   CONSTITUTIONAL: No documented fever. No fatigue, weakness. No weight gain, no weight loss.  EYES: No blurry or double vision.  ENT: No tinnitus. No postnasal drip. No redness of the oropharynx.  RESPIRATORY: No cough, no wheeze, no hemoptysis. No dyspnea.  CARDIOVASCULAR: No chest pain. No orthopnea. No palpitations. No syncope.  GASTROINTESTINAL positive nausea, positive vomiting or diarrhea. No abdominal pain. No melena or hematochezia.  GENITOURINARY: No dysuria or hematuria.  ENDOCRINE: No polyuria or nocturia. No heat or cold intolerance.  HEMATOLOGY: Positive anemia. No bruising. No bleeding.  INTEGUMENTARY: No rashes. No lesions.  MUSCULOSKELETAL: No arthritis. No swelling. No gout.  NEUROLOGIC: No numbness, tingling, or ataxia. No seizure-type activity.  PSYCHIATRIC: No anxiety. No insomnia. No ADD.    Vitals:   Filed Vitals:   12/15/14 0436 12/15/14 0438 12/15/14 0500 12/15/14 0917  BP: 154/37 154/83  156/72  Pulse: 80 62  66  Temp: 98.6 F (37 C)     TempSrc: Oral     Resp: 20     Height:      Weight:   68.04 kg (150 lb)   SpO2: 98%       Wt Readings from Last 3 Encounters:  12/15/14 68.04 kg (150 lb)  11/30/14 70.95 kg (156 lb 6.7 oz)  11/23/14 68.9 kg (151 lb 14.4 oz)     Intake/Output Summary (Last 24 hours) at 12/15/14 1127 Last data filed at 12/14/14 2000  Gross per 24 hour  Intake    600 ml   Output    650 ml  Net    -50 ml    Physical Exam:   GENERAL: Pleasant-appearing in no apparent distress.  HEAD, EYES, EARS, NOSE AND THROAT: Atraumatic, normocephalic. Extraocular muscles are intact. Pupils equal and reactive to light. Sclerae anicteric. No conjunctival injection. No oro-pharyngeal erythema.  NECK: Supple. There is no jugular venous distention. No bruits, no lymphadenopathy, no thyromegaly.  HEART: Regular rate and rhythm, tachycardic. No murmurs, no rubs, no clicks.  LUNGS:  Clear to auscultation bilaterally. No rales or rhonchi. No wheezes.  ABDOMEN: Soft, flat, nontender, nondistended. Has good bowel sounds. No hepatosplenomegaly appreciated.  EXTREMITIES: No evidence of any cyanosis, clubbing, or peripheral edema.  +2 pedal and radial pulses bilaterally.  NEUROLOGIC: The patient is alert, awake, and oriented x3 with no focal motor or sensory deficits appreciated bilaterally.  SKIN: Moist and warm with no rashes appreciated.  Psych: Not anxious, depressed LN: No inguinal LN enlargement    Antibiotics   Anti-infectives    Start     Dose/Rate Route Frequency Ordered Stop   12/13/14 0500  piperacillin-tazobactam (ZOSYN) IVPB 3.375 g     3.375 g 12.5 mL/hr over 240 Minutes Intravenous 3 times per day 12/13/14 0104     12/13/14 0330  vancomycin (VANCOCIN) IVPB 750 mg/150 ml premix  Status:  Discontinued     750 mg 150 mL/hr over 60 Minutes Intravenous Every 24 hours 12/13/14 0104 12/14/14 1149   12/12/14 2100  vancomycin (VANCOCIN) IVPB 1000 mg/200 mL premix     1,000 mg 200 mL/hr over 60 Minutes Intravenous  Once 12/12/14 2051 12/12/14 2248   12/12/14 2100  piperacillin-tazobactam (ZOSYN) IVPB 3.375 g     3.375 g 12.5 mL/hr over 240 Minutes Intravenous  Once 12/12/14 2051 12/12/14 2131      Medications   Scheduled Meds: . amLODipine  5 mg Oral Daily  . atenolol  50 mg Oral Daily  . benazepril  40 mg Oral Daily  . Chlorhexidine Gluconate Cloth  6 each  Topical Q0600  . cloNIDine  0.2 mg Oral BID  . collagenase   Topical Daily  . dextrose  25 mL Intravenous Once  . feeding supplement (RESOURCE BREEZE)  1 Container Oral TID WC  . furosemide  20 mg Oral QODAY  . hydrALAZINE  100 mg Oral BID  . insulin aspart  0-9 Units Subcutaneous TID AC & HS  . mirtazapine  15 mg Oral Daily  . mupirocin ointment  1 application Nasal BID  . pantoprazole  40 mg Oral BID  . piperacillin-tazobactam (ZOSYN)  IV  3.375 g Intravenous 3 times per day  . pravastatin  10 mg Oral q1800  . tamsulosin  0.4 mg Oral BID   Continuous Infusions:   PRN Meds:.   Data Review:   Micro Results Recent Results (from the past 240 hour(s))  Blood culture (routine x 2)     Status: None (Preliminary result)   Collection Time: 12/12/14  7:35 PM  Result Value Ref Range Status   Specimen Description BLOOD  Final   Special Requests Normal  Final   Culture NO GROWTH 3 DAYS  Final   Report Status PENDING  Incomplete  Blood culture (routine x 2)     Status: None (Preliminary result)   Collection Time: 12/12/14  8:18 PM  Result Value Ref Range Status   Specimen Description BLOOD RESISTANT HEEL OF FOOT  Final   Special Requests Normal  Final   Culture NO GROWTH 3 DAYS  Final   Report Status PENDING  Incomplete  Urine culture     Status: None (Preliminary result)   Collection Time: 12/13/14  1:20 AM  Result Value Ref Range Status   Specimen Description URINE, CATHETERIZED  Final   Special Requests Immunocompromised  Final   Culture   Final    >=100,000 COLONIES/mL GRAM NEGATIVE RODS IDENTIFICATION TO FOLLOW SUSCEPTIBILITIES TO FOLLOW    Report Status PENDING  Incomplete  MRSA PCR Screening  Status: Abnormal   Collection Time: 12/13/14  1:30 AM  Result Value Ref Range Status   MRSA by PCR POSITIVE (A) NEGATIVE Final    Comment:        The GeneXpert MRSA Assay (FDA approved for NASAL specimens only), is one component of a comprehensive MRSA  colonization surveillance program. It is not intended to diagnose MRSA infection nor to guide or monitor treatment for MRSA infections. CRITICAL RESULT CALLED TO, READ BACK BY AND VERIFIED WITH: RENEE BABB @0347  12/13/14 BY AJO     Radiology Reports Ct Abdomen Pelvis Wo Contrast  11/21/2014   CLINICAL DATA:  Restaging endometrial carcinoma metastatic to pelvic side wall. Suspected hepatic metastatic disease. Previous personal history of cervical cancer with radiation therapy. Subsequent encounter.  EXAM: CT ABDOMEN AND PELVIS WITHOUT CONTRAST  TECHNIQUE: Multidetector CT imaging of the abdomen and pelvis was performed following the standard protocol without IV contrast.  COMPARISON:  Abdominal pelvic CT 06/18/2014.  Chest CT 01/25/2012.  FINDINGS: Lower chest: There is persistent concern of an enlarging lingular nodule, measuring 2.0 cm on image 1. This is incompletely visualized. The right lung base appears clear. No other suspicious nodules identified. There is no significant residual pleural fluid. A small pericardial effusion and a small hiatal hernia are noted. Probable presternal sebaceous cyst appears unchanged.  Hepatobiliary: There is progressive diffuse hepatic steatosis. No focal lesions are identified on noncontrast imaging. The sensitivity of noncontrast CT for focal liver disease is decreased in the setting of hepatic steatosis. There are multiple gallstones. No significant biliary dilatation identified.  Pancreas:  Atrophied without focal abnormality.  Spleen: Normal in size without suspicious focal abnormality. Multiple small calcified granulomas noted.  Adrenals/Urinary Tract: Both adrenal glands appear normal.The kidneys appear stable with cortical thinning and atrophy. There are renal vascular calcifications, but no hydronephrosis. 5 mm hyperdense lesion in the lower pole of the right kidney on image 36 is stable. There is anterior bladder wall thickening with a small amount of gas  within the bladder lumen. No focal mass lesion identified.  Stomach/Bowel: No evidence of bowel wall thickening, distention or surrounding inflammatory change.Mild colonic diverticular changes are stable.  Vascular/Lymphatic: There is diffuse atherosclerosis of the aorta, its branches and the iliac arteries. Iliac stents are present There has been continued progression in extensive retroperitoneal lymphadenopathy. Pre caval node measures 4.2 x 3.6 cm on image 30 (previously 3.8 x 3.5 cm). Left periaortic node measures 3.6 x 2.6 cm on image 36 (previously 3.0 x 2.4 cm). Enlarged retroperitoneal lymph nodes extend into the false pelvis. There is no adenopathy in the lower pelvis.  Reproductive: Status post hysterectomy. No evidence of adnexal mass.  Other: Stable postsurgical changes within the anterior abdominal wall with absent subcutaneous fat inferiorly.  Musculoskeletal: No acute or significant osseous findings. Stable degenerative changes throughout the spine.  IMPRESSION: 1. Progressive nodal metastatic disease throughout the retroperitoneum. 2. No evidence of extra nodal metastases in the abdomen or pelvis. Hepatic evaluation is limited by noncontrast imaging, especially in the setting of progressive hepatic steatosis. 3. Persistent concern of lingular nodule, incompletely visualized. This could reflect a metastasis or primary bronchogenic carcinoma. The patient has no recent chest CT for correlation. Full chest CT recommended for further evaluation. 4. Stable incidental findings including bilateral renal atrophy, diffuse atherosclerosis and cholelithiasis.   Electronically Signed   By: Richardean Sale M.D.   On: 11/21/2014 10:56   Dg Chest 2 View  11/18/2014   CLINICAL DATA:  Altered mental  status  EXAM: CHEST  2 VIEW  COMPARISON:  07/31/2014  FINDINGS: Right midlung pulmonary nodule is again evident without significant interval change, measuring 14 mm. The lungs are otherwise clear. Pulmonary vasculature  is normal. There are no large pleural effusions.  There is a right subclavian Port-A-Cath with tip in the SVC.  There are calcified nodes in the right paratracheal region, unchanged.  IMPRESSION: Unchanged 14 mm pulmonary nodule on the right.  No acute cardiopulmonary findings.   Electronically Signed   By: Andreas Newport M.D.   On: 11/18/2014 00:31   Ct Head Wo Contrast  11/18/2014   CLINICAL DATA:  Poor historian, abnormal behavior. History of diabetes an uterine cancer.  EXAM: CT HEAD WITHOUT CONTRAST  TECHNIQUE: Contiguous axial images were obtained from the base of the skull through the vertex without intravenous contrast.  COMPARISON:  CT head report dated Oct 27, 2012 though images are not available for direct comparison.  FINDINGS: The ventricles and sulci are normal for age. No intraparenchymal hemorrhage, mass effect nor midline shift. Patchy supratentorial white matter hypodensities are less than expected for patient's age and though non-specific suggest sequelae of chronic small vessel ischemic disease. No acute large vascular territory infarcts. Tiny hypodensities in the bilateral basal ganglia.  No abnormal extra-axial fluid collections. Basal cisterns are patent. Moderate calcific atherosclerosis of the carotid siphons.  No skull fracture. The included ocular globes and orbital contents are non-suspicious. Bilateral ocular lens implants. The mastoid aircells and included paranasal sinuses are well-aerated.  IMPRESSION: No acute intracranial process  Involutional changes. Bilateral basal ganglia perivascular spaces and/or lacunar infarcts.  Mild to moderate white matter changes most consistent chronic small vessel ischemic disease.   Electronically Signed   By: Elon Alas M.D.   On: 11/18/2014 00:37   Dg Chest Portable 1 View  12/12/2014   CLINICAL DATA:  Vomiting today.  States for uterine cancer.  EXAM: PORTABLE CHEST - 1 VIEW  COMPARISON:  PA and lateral chest 11/17/2014.  FINDINGS:  1.4 cm nodule projecting in the right mid lung is unchanged. The lungs are otherwise clear. Heart size is normal. Calcified mediastinal lymph nodes are noted.  IMPRESSION: No acute disease.  No change in a 1.4 cm in pulmonary nodule on the right.   Electronically Signed   By: Inge Rise M.D.   On: 12/12/2014 21:39     CBC  Recent Labs Lab 12/12/14 1955 12/13/14 0740 12/13/14 1900 12/14/14 0624  WBC 16.6* 11.4* 10.0 10.5  HGB 3.0* 11.8* 12.1 12.1  HCT 10.0* 36.0 36.6 37.3  PLT 413 285 285 275  MCV 86.9 82.7 82.6 83.2  MCH 25.6* 27.0 27.2 26.9  MCHC 29.5* 32.6 32.9 32.3  RDW 19.9* 16.7* 17.3* 17.6*  LYMPHSABS 3.1  --  2.9  --   MONOABS 0.7  --  0.7  --   EOSABS 0.0  --  0.3  --   BASOSABS 0.1  --  0.1  --     Chemistries   Recent Labs Lab 12/12/14 1955 12/13/14 0740 12/13/14 1845 12/14/14 0624 12/15/14 0514  NA 132* 142 141 142  --   K 3.9 3.5 3.5 3.3*  --   CL 101 114* 115* 114*  --   CO2 17* 21* 20* 20*  --   GLUCOSE 894* 160* 127* 158*  --   BUN 38* 26* 22* 22*  --   CREATININE 1.47* 1.08* 1.15* 1.22* 1.39*  CALCIUM 7.8* 8.2* 7.7* 7.8*  --  MG  --   --   --   --  1.2*  AST 18  --   --   --   --   ALT 10*  --   --   --   --   ALKPHOS 43  --   --   --   --   BILITOT 0.4  --   --   --   --    ------------------------------------------------------------------------------------------------------------------ estimated creatinine clearance is 29.2 mL/min (by C-G formula based on Cr of 1.39). ------------------------------------------------------------------------------------------------------------------ No results for input(s): HGBA1C in the last 72 hours. ------------------------------------------------------------------------------------------------------------------ No results for input(s): CHOL, HDL, LDLCALC, TRIG, CHOLHDL, LDLDIRECT in the last 72  hours. ------------------------------------------------------------------------------------------------------------------ No results for input(s): TSH, T4TOTAL, T3FREE, THYROIDAB in the last 72 hours.  Invalid input(s): FREET3 ------------------------------------------------------------------------------------------------------------------ No results for input(s): VITAMINB12, FOLATE, FERRITIN, TIBC, IRON, RETICCTPCT in the last 72 hours.  Coagulation profile  Recent Labs Lab 12/12/14 1955 12/14/14 0625 12/15/14 0514  INR 1.37 2.11 2.79    No results for input(s): DDIMER in the last 72 hours.  Cardiac Enzymes  Recent Labs Lab 12/12/14 1955  TROPONINI <0.03   ------------------------------------------------------------------------------------------------------------------ Invalid input(s): POCBNP    Assessment & Plan   1.  Anemia possible secondary  to GI bleed. Status post transfusion continue Protonix drip d/c after EGD.   EGD showed mild gastropathy involving antrum. Hb stable. Likely the first result with very low Hb was a mistake. 2. GI bleed- reviewed EGD results, appreciated GI consult,.    If bleeds again- may need colonoscopy.     Will upgrade the diet. 3. Uncontrolled diabetes mellitus type 2 with severe hyperglycemia, no DKA. On insulin 4. sepsis, due to UTI, stage II decubitus ulcer sacral region. Continue aggressive IV anabiotic's with Zosyn DC Vanco - awaiting urine cx report. 5. Metastatic uterine cancer, currently not under any treatment. 6. Hypertension, stable continue atenolol and clonidine 7. Hyperlipidemia, continue pravastatin 8. Code DO NOT RESUSCITATE appreciated palliative care input     Code Status Orders        Start     Ordered   12/13/14 0056   DNR   12/13/14 0055    Advance Directive Documentation        Most Recent Value   Type of Advance Directive  Healthcare Power of Attorney   Pre-existing out of facility DNR order (yellow  form or pink MOST form)     "MOST" Form in Place?        Consults  GI DVT Prophylaxis  SCDs and let a GI bleed  Lab Results  Component Value Date   PLT 275 12/14/2014    Time Spent in minutes  75min   Greater than 50% of time spent in care coordination and counseling.   Vaughan Basta M.D on 12/15/2014 at 11:27 AM  Between 7am to 6pm - Pager - [phone]  After 6pm go to [email or web address] - password [email or web address] [facility]   Office  [phone]

## 2014-12-15 NOTE — Progress Notes (Signed)
Notified Dr Jannifer Franklin of CBG 321 and current SSI parameters. MD to place orders.

## 2014-12-16 LAB — GLUCOSE, CAPILLARY
GLUCOSE-CAPILLARY: 263 mg/dL — AB (ref 65–99)
GLUCOSE-CAPILLARY: 358 mg/dL — AB (ref 65–99)
Glucose-Capillary: 264 mg/dL — ABNORMAL HIGH (ref 65–99)
Glucose-Capillary: 321 mg/dL — ABNORMAL HIGH (ref 65–99)

## 2014-12-16 LAB — PHOSPHORUS: Phosphorus: 3.7 mg/dL (ref 2.5–4.6)

## 2014-12-16 LAB — BASIC METABOLIC PANEL
Anion gap: 5 (ref 5–15)
BUN: 25 mg/dL — AB (ref 6–20)
CO2: 19 mmol/L — ABNORMAL LOW (ref 22–32)
CREATININE: 1.52 mg/dL — AB (ref 0.44–1.00)
Calcium: 7.4 mg/dL — ABNORMAL LOW (ref 8.9–10.3)
Chloride: 111 mmol/L (ref 101–111)
GFR, EST AFRICAN AMERICAN: 37 mL/min — AB (ref >60)
GFR, EST NON AFRICAN AMERICAN: 32 mL/min — AB (ref >60)
Glucose, Bld: 213 mg/dL — ABNORMAL HIGH (ref 65–99)
POTASSIUM: 3.6 mmol/L (ref 3.5–5.1)
Sodium: 135 mmol/L (ref 135–145)

## 2014-12-16 LAB — MAGNESIUM: Magnesium: 2.1 mg/dL (ref 1.7–2.4)

## 2014-12-16 MED ORDER — CEPHALEXIN 500 MG PO CAPS: 500.0000 mg | ORAL_CAPSULE | Freq: Four times a day (QID) | ORAL | Status: DC

## 2014-12-16 MED ORDER — CLONIDINE HCL 0.1 MG PO TABS
0.1000 mg | ORAL_TABLET | Freq: Once | ORAL | Status: AC
  Administered 2014-12-16: 0.1 mg via ORAL

## 2014-12-16 NOTE — Discharge Instructions (Addendum)
Follow with PMD in 1 week to check renal function. Gastrointestinal Bleeding Gastrointestinal bleeding is bleeding somewhere along the path that food travels through the body (digestive tract). This path is anywhere between the mouth and the opening of the butt (anus). You may have blood in your throw up (vomit) or in your poop (stools). If there is a lot of bleeding, you may need to stay in the hospital. [place]  Only take medicine as told by your doctor.  Eat foods with fiber such as whole grains, fruits, and vegetables. You can also try eating 1 to 3 prunes a day.  Drink enough fluids to keep your pee (urine) clear or pale yellow. GET HELP RIGHT AWAY IF:   Your bleeding gets worse.  You feel dizzy, weak, or you pass out (faint).  You have bad cramps in your back or belly (abdomen).  You have large blood clumps (clots) in your poop.  Your problems are getting worse. MAKE SURE YOU:   Understand these instructions.  Will watch your condition.  Will get help right away if you are not doing well or get worse. Document Released: 03/03/2008 Document Revised: 05/11/2012 Document Reviewed: 05/04/2011 [facility] Patient Information 2015 [place], [place]. This information is not intended to replace advice given to you by your health care provider. Make sure you discuss any questions you have with your health care provider.

## 2014-12-16 NOTE — Care Management Note (Signed)
Case Management Note  Patient Details  Name: Shelley Fields MRN: [id number] Date of Birth: 04-Oct-1935  Subjective/Objective:     79yo Ms [name] resides alone and her elderly sister from [place] is temporarily here to assist in the home. Sister [name] stated that she does not think that she can manage [name] at home unless she can move independently. Daughter [name] called to report to [name]'s nurse that she would like for [name] to build up her strength prior to starting chemo. PT note from yesterday states that [name] is not yet a baseline. A Social Work Consult has been initiated . No discharge orders today.             Action/Plan:   Expected Discharge Date:                  Expected Discharge Plan:  [place]  In-House Referral:     Discharge planning Services  CM Consult  Post Acute Care Choice:    Choice offered to:     DME Arranged:    DME Agency:     HH Arranged:    [place] Agency:     Status of Service:  In process, will continue to follow  Medicare Important Message Given:  Yes-second notification given Date Medicare IM Given:    Medicare IM give by:    Date Additional Medicare IM Given:    Additional Medicare Important Message give by:     If discussed at [place] of Stay Meetings, dates discussed:    Additional Comments:  Kaysen Deal A, RN 12/16/2014, 1:46 PM

## 2014-12-16 NOTE — Discharge Summary (Addendum)
[facility] [place] NAME: Shelley Fields    MR#:  [id number]  DATE OF BIRTH:  21-Nov-1935  DATE OF ADMISSION:  12/12/2014 ADMITTING PHYSICIAN: Juluis Mire, MD  DATE OF DISCHARGE: 12/17/2014  PRIMARY CARE PHYSICIAN: [name], MD    ADMISSION DIAGNOSIS:  Acute upper GI bleed [K92.2] Acute hyperglycemia [R73.09] Sepsis, due to unspecified organism [A41.9]  DISCHARGE DIAGNOSIS:  Principal Problem:   Severe anemia Active Problems:   GI bleed   DM (diabetes mellitus), type 2, uncontrolled   Diabetes mellitus with hyperglycemia   Leucocytosis   UTI (urinary tract infection)   Decubitus ulcer of back, stage 2   HTN (hypertension)   SECONDARY DIAGNOSIS:   Past Medical History  Diagnosis Date  . Diabetes mellitus without complication   . C. difficile diarrhea   . Uterine cancer   . Hypogammaglobulinemia   . Pulmonary embolism   . Decubitus ulcers   . Hypertension   . Hypercholesterolemia   . Incontinence of urine   . Cancer     Endometrial Cancer  . Cervical cancer     with radiation 45 years ago...  . Arthritis   . GERD (gastroesophageal reflux disease)   . Bladder cancer   . Decubitus ulcer of buttock, stage 1     HOSPITAL COURSE:   1. Anemia possible secondary to GI bleed. Status post transfusion continue Protonix drip d/c after EGD.  EGD showed mild gastropathy involving antrum. Hb stable. Likely the first result with very low Hb was a mistake. 2. GI bleed- reviewed EGD results, appreciated GI consult,.  If bleeds again- may need colonoscopy.  Will upgrade the diet. She tolerated. Discharge home.. 3. Uncontrolled diabetes mellitus type 2 with severe hyperglycemia, no DKA. On insulin 4. sepsis, due to UTI, stage II decubitus ulcer sacral region. Continue aggressive IV anabiotic's with Zosyn DC Vanco - per Urine cx- klebsiella- sensitive to cefalosporine. 5. Metastatic uterine cancer, currently  not under any treatment. Have chronic foley due to that. 6. Hypertension, stable continue home meds. 7. Hyperlipidemia, continue pravastatin 8. Code DO NOT RESUSCITATE appreciated palliative care  9. Hx of DVT- on coumadin. 10. decubetus ulcers- chronic.  The plan was to discharge on 12/16/14- could not- due to her functional decline and possible need for rehab.   Spoke to case amanger today- and she will be discharged to rehab facility. DISCHARGE CONDITIONS:   Stable.  CONSULTS OBTAINED:  Treatment Team:  Lequita Asal, MD Hulen Luster, MD  DRUG ALLERGIES:   Allergies  Allergen Reactions  . Contrast Media [Iodinated Diagnostic Agents]     DISCHARGE MEDICATIONS:   Current Discharge Medication List    CONTINUE these medications which have CHANGED   Details  cephALEXin (KEFLEX) 500 MG capsule Take 1 capsule (500 mg total) by mouth 4 (four) times daily. Qty: 28 capsule, Refills: 0      CONTINUE these medications which have NOT CHANGED   Details  amLODipine (NORVASC) 5 MG tablet TK 1 T PO QD Refills: 0    atenolol (TENORMIN) 50 MG tablet Take 50 mg by mouth daily.    benazepril (LOTENSIN) 40 MG tablet Take 40 mg by mouth daily.    cloNIDine (CATAPRES) 0.2 MG tablet Take 0.2 mg by mouth 2 (two) times daily.    furosemide (LASIX) 20 MG tablet Take 20 mg by mouth every other day.    HUMULIN 70/30 (70-30) 100 UNIT/ML injection 16 Units daily  at 6 PM.    hydrALAZINE (APRESOLINE) 100 MG tablet Take 100 mg by mouth 2 (two) times daily.    lovastatin (MEVACOR) 20 MG tablet Take 20 mg by mouth daily.    meclizine (ANTIVERT) 25 MG tablet Take 25 mg by mouth daily.    megestrol (MEGACE ES) 625 MG/5ML suspension Take 1.6 mLs (200 mg total) by mouth daily. to stimulate appetite Qty: 150 mL, Refills: 0    mirtazapine (REMERON) 15 MG tablet Take 15 mg by mouth daily.    NOVOLOG FLEXPEN 100 UNIT/ML FlexPen Per Sliding Scale...    nystatin (MYCOSTATIN) powder      omeprazole (PRILOSEC) 20 MG capsule Take 20 mg by mouth daily. Refills: 12    SANTYL ointment     tamsulosin (FLOMAX) 0.4 MG CAPS capsule Take 0.4 mg by mouth 2 (two) times daily.    warfarin (COUMADIN) 1 MG tablet Take 0.5 mg by mouth daily.      STOP taking these medications     potassium chloride (K-DUR) 10 MEQ tablet      promethazine (PHENERGAN) 12.5 MG tablet          DISCHARGE INSTRUCTIONS:    Follow with PMD in 2 weeks.  If you experience worsening of your admission symptoms, develop shortness of breath, life threatening emergency, suicidal or homicidal thoughts you must seek medical attention immediately by calling 911 or calling your MD immediately  if symptoms less severe.  You Must read complete instructions/literature along with all the possible adverse reactions/side effects for all the Medicines you take and that have been prescribed to you. Take any new Medicines after you have completely understood and accept all the possible adverse reactions/side effects.   Please note  You were cared for by a hospitalist during your hospital stay. If you have any questions about your discharge medications or the care you received while you were in the hospital after you are discharged, you can call the unit and asked to speak with the hospitalist on call if the hospitalist that took care of you is not available. Once you are discharged, your primary care physician will handle any further medical issues. Please note that NO REFILLS for any discharge medications will be authorized once you are discharged, as it is imperative that you return to your primary care physician (or establish a relationship with a primary care physician if you do not have one) for your aftercare needs so that they can reassess your need for medications and monitor your lab values.    Today   CHIEF COMPLAINT:   Chief Complaint  Patient presents with  . Emesis    HISTORY OF PRESENT ILLNESS:   Shelley Fields  is a 79 y.o. female presents to the emergency room via EMS with the complaints of ongoing vomiting./brown colored material since morning. Denies any fever, chills, shortness of breath, chest pain, abdominal pain, diarrhea. No cough. In the emergency room patient was evaluated by the ED physician and found to be with stable vital signs and workup revealed severe anemia with hemoglobin and hematocrit of 3.0/10.0. Patient was also noted to have markedly elevated blood glucose of 894, serum bicarbonate 17 and normal anion gap. WBC elevated at 16.6, urinalysis with WBC too numerous to count, leukocyte esterase positive. Stool guaiac heme positive per ED physician's note. After obtaining blood and urine cultures, patient was started on IV antibiotics vancomycin and Zosyn, was also started on insulin drip, and 2 units of PRBC transfusion ordered by  the ED physician which is pending at the time. Hospitalist service was consulted for further management.  VITAL SIGNS:  Blood pressure 162/62, pulse 63, temperature 98.2 F (36.8 C), temperature source Oral, resp. rate 20, height 5' (1.524 m), weight 74.526 kg (164 lb 4.8 oz), SpO2 100 %.  I/O:    Intake/Output Summary (Last 24 hours) at 12/17/14 1032 Last data filed at 12/17/14 0900  Gross per 24 hour  Intake      0 ml  Output    350 ml  Net   -350 ml    PHYSICAL EXAMINATION:   GENERAL: Pleasant-appearing in no apparent distress.  HEAD, EYES, EARS, NOSE AND THROAT: Atraumatic, normocephalic. Extraocular muscles are intact. Pupils equal and reactive to light. Sclerae anicteric. No conjunctival injection. No oro-pharyngeal erythema.  NECK: Supple. There is no jugular venous distention. No bruits, no lymphadenopathy, no thyromegaly.  HEART: Regular rate and rhythm, tachycardic. No murmurs, no rubs, no clicks.  LUNGS: Clear to auscultation bilaterally. No rales or rhonchi. No wheezes.  ABDOMEN: Soft, flat, nontender, nondistended. Has  good bowel sounds. No hepatosplenomegaly appreciated. foley present. EXTREMITIES: No evidence of any cyanosis, clubbing, or peripheral edema. +2 pedal and radial pulses bilaterally.  NEUROLOGIC: The patient is alert, awake, and oriented x3 with no focal motor or sensory deficits appreciated bilaterally.  SKIN: Moist and warm with no rashes appreciated.  Psych: Not anxious, depressed LN: No inguinal LN enlargement  DATA REVIEW:   CBC  Recent Labs Lab 12/15/14 0522  WBC 11.2*  HGB 11.9*  HCT 37.0  PLT 260    Chemistries   Recent Labs Lab 12/12/14 1955  12/17/14 0542  NA 132*  < > 135  K 3.9  < > 3.7  CL 101  < > 110  CO2 17*  < > 19*  GLUCOSE 894*  < > 266*  BUN 38*  < > 28*  CREATININE 1.47*  < > 1.60*  CALCIUM 7.8*  < > 7.4*  MG  --   < > 2.0  AST 18  --   --   ALT 10*  --   --   ALKPHOS 43  --   --   BILITOT 0.4  --   --   < > = values in this interval not displayed.  Cardiac Enzymes  Recent Labs Lab 12/12/14 1955  TROPONINI <0.03    Microbiology Results  Results for orders placed or performed during the hospital encounter of 12/12/14  Blood culture (routine x 2)     Status: None   Collection Time: 12/12/14  7:35 PM  Result Value Ref Range Status   Specimen Description BLOOD  Final   Special Requests Normal  Final   Culture NO GROWTH 5 DAYS  Final   Report Status 12/17/2014 FINAL  Final  Blood culture (routine x 2)     Status: None   Collection Time: 12/12/14  8:18 PM  Result Value Ref Range Status   Specimen Description BLOOD RESISTANT HEEL OF FOOT  Final   Special Requests Normal  Final   Culture NO GROWTH 5 DAYS  Final   Report Status 12/17/2014 FINAL  Final  Urine culture     Status: None   Collection Time: 12/13/14  1:20 AM  Result Value Ref Range Status   Specimen Description URINE, CATHETERIZED  Final   Special Requests Immunocompromised  Final   Culture >=100,000 COLONIES/mL KLEBSIELLA PNEUMONIAE  Final   Report Status 12/15/2014  FINAL  Final  Organism ID, Bacteria KLEBSIELLA PNEUMONIAE  Final      Susceptibility   Klebsiella pneumoniae - MIC*    AMPICILLIN 8 RESISTANT Resistant     CEFTAZIDIME <=1 SENSITIVE Sensitive     CEFAZOLIN <=4 SENSITIVE Sensitive     CEFTRIAXONE <=1 SENSITIVE Sensitive     CIPROFLOXACIN <=0.25 SENSITIVE Sensitive     GENTAMICIN <=1 SENSITIVE Sensitive     IMIPENEM <=0.25 SENSITIVE Sensitive     TRIMETH/SULFA <=20 SENSITIVE Sensitive     * >=100,000 COLONIES/mL KLEBSIELLA PNEUMONIAE  MRSA PCR Screening     Status: Abnormal   Collection Time: 12/13/14  1:30 AM  Result Value Ref Range Status   MRSA by PCR POSITIVE (A) NEGATIVE Final    Comment:        The GeneXpert MRSA Assay (FDA approved for NASAL specimens only), is one component of a comprehensive MRSA colonization surveillance program. It is not intended to diagnose MRSA infection nor to guide or monitor treatment for MRSA infections. CRITICAL RESULT CALLED TO, READ BACK BY AND VERIFIED WITH: RENEE BABB @0347  12/13/14 BY AJO     RADIOLOGY:  No results found.   Management plans discussed with the patient, family and they are in agreement.  CODE STATUS:     Code Status Orders        Start     Ordered   12/14/14 1119  Do not attempt resuscitation (DNR)   Continuous    Question Answer Comment  In the event of cardiac or respiratory ARREST Do not call a "code blue"   In the event of cardiac or respiratory ARREST Do not perform Intubation, CPR, defibrillation or ACLS   In the event of cardiac or respiratory ARREST Use medication by any route, position, wound care, and other measures to relive pain and suffering. May use oxygen, suction and manual treatment of airway obstruction as needed for comfort.      12/14/14 1118    Advance Directive Documentation        Most Recent Value   Type of Advance Directive  Healthcare Power of Attorney   Pre-existing out of facility DNR order (yellow form or pink MOST form)      "MOST" Form in Place?        TOTAL TIME TAKING CARE OF THIS PATIENT: 40 minutes.    Vaughan Basta M.D on 12/17/2014 at 10:32 AM  Between 7am to 6pm - Pager - [phone]  After 6pm go to [email or web address] - password [email or web address] [facility]  Office  [phone]  CC: Primary care physician; [name], MD

## 2014-12-16 NOTE — Plan of Care (Signed)
Problem: Discharge Progression Outcomes Goal: Other Discharge Outcomes/Goals Outcome: Progressing Plan of care progress to goals: A&O patient, denies pain. No signs of bleeding. Hgb stable.VSS. Tolerating diet, denies nausea/vomiting. Foley intact. Dressings dry and intact.

## 2014-12-16 NOTE — Progress Notes (Signed)
PARENTERAL NUTRITION CONSULT NOTE - FOLLOW UP  Pharmacy Consult for Electrolyte Mgmt   Allergies  Allergen Reactions  . Contrast Media [Iodinated Diagnostic Agents]     Patient Measurements: Height: 5' (152.4 cm) Weight: 161 lb 11.2 oz (73.347 kg) IBW/kg (Calculated) : 45.5   Vital Signs: Temp: 98.6 F (37 C) (07/10 0534) Temp Source: Oral (07/10 0534) BP: 148/54 mmHg (07/10 0534) Pulse Rate: 72 (07/10 0534) Intake/Output from previous day: 07/09 0701 - 07/10 0700 In: -  Out: 500 [Urine:500] Intake/Output from this shift: Total I/O In: -  Out: 100 [Urine:100]  Labs:  Recent Labs  12/13/14 1900 12/14/14 0624 12/14/14 0625 12/15/14 0514 12/15/14 0522  WBC 10.0 10.5  --   --  11.2*  HGB 12.1 12.1  --   --  11.9*  HCT 36.6 37.3  --   --  37.0  PLT 285 275  --   --  260  INR  --   --  2.11 2.79  --      Recent Labs  12/14/14 0624 12/15/14 0514 12/15/14 0522 12/16/14 0553  NA 142  --  136 135  K 3.3*  --  3.8 3.6  CL 114*  --  109 111  CO2 20*  --  17* 19*  GLUCOSE 158*  --  220* 213*  BUN 22*  --  22* 25*  CREATININE 1.22* 1.39* 1.34* 1.52*  CALCIUM 7.8*  --  7.4* 7.4*  MG  --  1.2*  --  2.1  PHOS  --  3.1  --  3.7   Estimated Creatinine Clearance: 27.7 mL/min (by C-G formula based on Cr of 1.52).    Recent Labs  12/15/14 1111 12/15/14 1627 12/15/14 2218  GLUCAP 205* 201* 278*    Medications:  Scheduled:  . amLODipine  5 mg Oral Daily  . atenolol  50 mg Oral Daily  . benazepril  40 mg Oral Daily  . cefTRIAXone (ROCEPHIN)  IV  1 g Intravenous Q24H  . Chlorhexidine Gluconate Cloth  6 each Topical Q0600  . cloNIDine  0.2 mg Oral BID  . collagenase   Topical Daily  . dextrose  25 mL Intravenous Once  . feeding supplement (RESOURCE BREEZE)  1 Container Oral TID WC  . furosemide  20 mg Oral QODAY  . hydrALAZINE  100 mg Oral BID  . insulin aspart  0-9 Units Subcutaneous TID AC & HS  . mirtazapine  15 mg Oral Daily  . mupirocin ointment  1  application Nasal BID  . pantoprazole  40 mg Oral BID  . pravastatin  10 mg Oral q1800  . tamsulosin  0.4 mg Oral BID   Infusions:   PRN: morphine injection, ondansetron (ZOFRAN) IV   Assessment: Electrolytes are wnl.   Plan:  Will f/u AM labs.   Ulice Dash D 12/16/2014,6:41 AM

## 2014-12-16 NOTE — Progress Notes (Signed)
Pt lives alone, her sister from [facility] came to help her for few days. Pt is still not at her baseline- and will not be able to be managed by her sister- so will cancel discharge today. She need reevaluation of her discharge requirements tomorrow by CSW and PT.

## 2014-12-16 NOTE — Progress Notes (Signed)
Spoke to Dr Jannifer Franklin re: patient blood pressure 143/39 order for clonidine and hydralazine tonight , per dr Jannifer Franklin hold hydralizine and will change order for tonight for clonidine to 0.1 mg ok to give this so patient will not have rebound hypertension

## 2014-12-16 NOTE — Progress Notes (Signed)
Resume home health PT and RN with [facility]. V.O. Dr Barbara Cower, RN, BSN, 12/16/14 @ 1pm.

## 2014-12-16 NOTE — Plan of Care (Signed)
Problem: Discharge Progression Outcomes Goal: Other Discharge Outcomes/Goals Outcome: Progressing Plan of care progress: GI bleed/infection: -monitor labs, HGB 11.9 -continues IV ABT for UTI -dressing changed per order on sacrum and buttocks -turned and repositioned throughout shift -tolerates diet -pt was going to discharge home with home health today but family feels that pt needs to gain more strength, discharge cancelled for possibly 2 days -chronic foley draining without difficulty -no complaints of pain, no distress or discomfort noted

## 2014-12-16 NOTE — Care Management Note (Signed)
Case Management Note  Patient Details  Name: Shelley Fields MRN: [id number] Date of Birth: [birth year]  Subjective/Objective:    If Mrs Intrieri is discharged home she will need a resumption of orders to [facility] [phone]) for resumption of care for PT and RN.                Action/Plan:   Expected Discharge Date:                  Expected Discharge Plan:  [place]  In-House Referral:     Discharge planning Services  CM Consult  Post Acute Care Choice:    Choice offered to:     DME Arranged:    DME Agency:     HH Arranged:    [place] Agency:     Status of Service:  In process, will continue to follow  Medicare Important Message Given:  Yes-second notification given Date Medicare IM Given:    Medicare IM give by:    Date Additional Medicare IM Given:    Additional Medicare Important Message give by:     If discussed at [place] of Stay Meetings, dates discussed:    Additional Comments:  Kendal Ghazarian A, RN 12/16/2014, 2:33 PM

## 2014-12-17 ENCOUNTER — Encounter: Payer: Self-pay | Admitting: Gastroenterology

## 2014-12-17 DIAGNOSIS — M25552 Pain in left hip: Secondary | ICD-10-CM | POA: Diagnosis not present

## 2014-12-17 DIAGNOSIS — I82409 Acute embolism and thrombosis of unspecified deep veins of unspecified lower extremity: Secondary | ICD-10-CM | POA: Diagnosis not present

## 2014-12-17 DIAGNOSIS — F339 Major depressive disorder, recurrent, unspecified: Secondary | ICD-10-CM | POA: Diagnosis not present

## 2014-12-17 DIAGNOSIS — M6281 Muscle weakness (generalized): Secondary | ICD-10-CM | POA: Diagnosis not present

## 2014-12-17 DIAGNOSIS — E785 Hyperlipidemia, unspecified: Secondary | ICD-10-CM | POA: Diagnosis not present

## 2014-12-17 DIAGNOSIS — R7309 Other abnormal glucose: Secondary | ICD-10-CM | POA: Diagnosis not present

## 2014-12-17 DIAGNOSIS — C541 Malignant neoplasm of endometrium: Secondary | ICD-10-CM | POA: Diagnosis not present

## 2014-12-17 DIAGNOSIS — N139 Obstructive and reflux uropathy, unspecified: Secondary | ICD-10-CM | POA: Diagnosis not present

## 2014-12-17 DIAGNOSIS — I10 Essential (primary) hypertension: Secondary | ICD-10-CM | POA: Diagnosis not present

## 2014-12-17 DIAGNOSIS — N393 Stress incontinence (female) (male): Secondary | ICD-10-CM | POA: Diagnosis not present

## 2014-12-17 DIAGNOSIS — A419 Sepsis, unspecified organism: Secondary | ICD-10-CM | POA: Diagnosis not present

## 2014-12-17 DIAGNOSIS — M6259 Muscle wasting and atrophy, not elsewhere classified, multiple sites: Secondary | ICD-10-CM | POA: Diagnosis not present

## 2014-12-17 DIAGNOSIS — L8990 Pressure ulcer of unspecified site, unspecified stage: Secondary | ICD-10-CM | POA: Diagnosis not present

## 2014-12-17 DIAGNOSIS — L89312 Pressure ulcer of right buttock, stage 2: Secondary | ICD-10-CM | POA: Diagnosis not present

## 2014-12-17 DIAGNOSIS — E119 Type 2 diabetes mellitus without complications: Secondary | ICD-10-CM | POA: Diagnosis not present

## 2014-12-17 DIAGNOSIS — R630 Anorexia: Secondary | ICD-10-CM | POA: Diagnosis not present

## 2014-12-17 DIAGNOSIS — R339 Retention of urine, unspecified: Secondary | ICD-10-CM | POA: Diagnosis not present

## 2014-12-17 DIAGNOSIS — D649 Anemia, unspecified: Secondary | ICD-10-CM | POA: Diagnosis not present

## 2014-12-17 DIAGNOSIS — K922 Gastrointestinal hemorrhage, unspecified: Secondary | ICD-10-CM | POA: Diagnosis not present

## 2014-12-17 DIAGNOSIS — L89150 Pressure ulcer of sacral region, unstageable: Secondary | ICD-10-CM | POA: Diagnosis not present

## 2014-12-17 DIAGNOSIS — M129 Arthropathy, unspecified: Secondary | ICD-10-CM | POA: Diagnosis not present

## 2014-12-17 DIAGNOSIS — L89322 Pressure ulcer of left buttock, stage 2: Secondary | ICD-10-CM | POA: Diagnosis not present

## 2014-12-17 DIAGNOSIS — C679 Malignant neoplasm of bladder, unspecified: Secondary | ICD-10-CM | POA: Diagnosis not present

## 2014-12-17 DIAGNOSIS — L989 Disorder of the skin and subcutaneous tissue, unspecified: Secondary | ICD-10-CM | POA: Diagnosis not present

## 2014-12-17 DIAGNOSIS — H81399 Other peripheral vertigo, unspecified ear: Secondary | ICD-10-CM | POA: Diagnosis not present

## 2014-12-17 DIAGNOSIS — F328 Other depressive episodes: Secondary | ICD-10-CM | POA: Diagnosis not present

## 2014-12-17 DIAGNOSIS — N390 Urinary tract infection, site not specified: Secondary | ICD-10-CM | POA: Diagnosis not present

## 2014-12-17 DIAGNOSIS — L89622 Pressure ulcer of left heel, stage 2: Secondary | ICD-10-CM | POA: Diagnosis not present

## 2014-12-17 DIAGNOSIS — I4891 Unspecified atrial fibrillation: Secondary | ICD-10-CM | POA: Diagnosis not present

## 2014-12-17 DIAGNOSIS — A4902 Methicillin resistant Staphylococcus aureus infection, unspecified site: Secondary | ICD-10-CM | POA: Diagnosis not present

## 2014-12-17 DIAGNOSIS — I5032 Chronic diastolic (congestive) heart failure: Secondary | ICD-10-CM | POA: Diagnosis not present

## 2014-12-17 DIAGNOSIS — C55 Malignant neoplasm of uterus, part unspecified: Secondary | ICD-10-CM | POA: Diagnosis not present

## 2014-12-17 DIAGNOSIS — K219 Gastro-esophageal reflux disease without esophagitis: Secondary | ICD-10-CM | POA: Diagnosis not present

## 2014-12-17 DIAGNOSIS — I119 Hypertensive heart disease without heart failure: Secondary | ICD-10-CM | POA: Diagnosis not present

## 2014-12-17 DIAGNOSIS — Z86711 Personal history of pulmonary embolism: Secondary | ICD-10-CM | POA: Diagnosis not present

## 2014-12-17 DIAGNOSIS — I739 Peripheral vascular disease, unspecified: Secondary | ICD-10-CM | POA: Diagnosis not present

## 2014-12-17 DIAGNOSIS — E1165 Type 2 diabetes mellitus with hyperglycemia: Secondary | ICD-10-CM | POA: Diagnosis not present

## 2014-12-17 LAB — GLUCOSE, CAPILLARY
GLUCOSE-CAPILLARY: 230 mg/dL — AB (ref 65–99)
Glucose-Capillary: 245 mg/dL — ABNORMAL HIGH (ref 65–99)

## 2014-12-17 LAB — MAGNESIUM: MAGNESIUM: 2 mg/dL (ref 1.7–2.4)

## 2014-12-17 LAB — CULTURE, BLOOD (ROUTINE X 2)
CULTURE: NO GROWTH
CULTURE: NO GROWTH
SPECIAL REQUESTS: NORMAL
Special Requests: NORMAL

## 2014-12-17 LAB — BASIC METABOLIC PANEL
Anion gap: 6 (ref 5–15)
BUN: 28 mg/dL — AB (ref 6–20)
CO2: 19 mmol/L — AB (ref 22–32)
Calcium: 7.4 mg/dL — ABNORMAL LOW (ref 8.9–10.3)
Chloride: 110 mmol/L (ref 101–111)
Creatinine, Ser: 1.6 mg/dL — ABNORMAL HIGH (ref 0.44–1.00)
GFR calc Af Amer: 35 mL/min — ABNORMAL LOW (ref >60)
GFR, EST NON AFRICAN AMERICAN: 30 mL/min — AB (ref >60)
Glucose, Bld: 266 mg/dL — ABNORMAL HIGH (ref 65–99)
Potassium: 3.7 mmol/L (ref 3.5–5.1)
Sodium: 135 mmol/L (ref 135–145)

## 2014-12-17 LAB — PHOSPHORUS: Phosphorus: 3.6 mg/dL (ref 2.5–4.6)

## 2014-12-17 LAB — ALBUMIN: Albumin: 1.8 g/dL — ABNORMAL LOW (ref 3.5–5.0)

## 2014-12-17 MED ORDER — FLEET ENEMA 7-19 GM/118ML RE ENEM
1.0000 | ENEMA | Freq: Once | RECTAL | Status: AC
  Administered 2014-12-17: 1 via RECTAL

## 2014-12-17 NOTE — Clinical Social Work Placement (Signed)
   CLINICAL SOCIAL WORK PLACEMENT  NOTE  Date:  12/17/2014  Patient Details  Name: Shelley Fields MRN: [id number] Date of Birth: [birth year]  Clinical Social Work is seeking post-discharge placement for this patient at the [place] level of care (*CSW will initial, date and re-position this form in  chart as items are completed):  Yes   Patient/family provided with [facility] Work Department's list of facilities offering this level of care within the geographic area requested by the patient (or if unable, by the patient's family).  Yes   Patient/family informed of their freedom to choose among providers that offer the needed level of care, that participate in Medicare, Medicaid or managed care program needed by the patient, have an available bed and are willing to accept the patient.  Yes   Patient/family informed of [facility]'s ownership interest in [facility] and [facility], as well as of the fact that they are under no obligation to receive care at these facilities.  PASRR submitted to EDS on       PASRR number received on       Existing PASRR number confirmed on 12/17/14     FL2 transmitted to all facilities in geographic area requested by pt/family on 12/17/14     FL2 transmitted to all facilities within larger geographic area on       Patient informed that his/her managed care company has contracts with or will negotiate with certain facilities, including the following:        Yes   Patient/family informed of bed offers received.  Patient chooses bed at  [facility])     Physician recommends and patient chooses bed at  [facility])    Patient to be transferred to  [facility]) on 12/17/14.  Patient to be transferred to facility by  [facility])     Patient family notified on 12/17/14 of transfer.  Name of family member notified:   [name], pt's sister)     PHYSICIAN       Additional  Comment:    _______________________________________________ Darden Dates, LCSW 12/17/2014, 12:30 PM

## 2014-12-17 NOTE — Care Management (Signed)
IM given. Possible discharge today

## 2014-12-17 NOTE — Progress Notes (Signed)
PARENTERAL NUTRITION CONSULT NOTE - FOLLOW UP  Pharmacy Consult for Electrolyte Mgmt   Allergies  Allergen Reactions  . Contrast Media [Iodinated Diagnostic Agents]     Patient Measurements: Height: 5' (152.4 cm) Weight: 164 lb 4.8 oz (74.526 kg) IBW/kg (Calculated) : 45.5   Vital Signs: BP: 145/49 mmHg (07/11 0511) Pulse Rate: 77 (07/11 0511) Intake/Output from previous day: 07/10 0701 - 07/11 0700 In: -  Out: 350 [Urine:350] Intake/Output from this shift:    Labs:  Recent Labs  12/15/14 0514 12/15/14 0522  WBC  --  11.2*  HGB  --  11.9*  HCT  --  37.0  PLT  --  260  INR 2.79  --      Recent Labs  12/15/14 0514 12/15/14 0522 12/16/14 0553 12/17/14 0542  NA  --  136 135 135  K  --  3.8 3.6 3.7  CL  --  109 111 110  CO2  --  17* 19* 19*  GLUCOSE  --  220* 213* 266*  BUN  --  22* 25* 28*  CREATININE 1.39* 1.34* 1.52* 1.60*  CALCIUM  --  7.4* 7.4* 7.4*  MG 1.2*  --  2.1 2.0  PHOS 3.1  --  3.7 3.6  ALBUMIN  --   --   --  1.8*   Estimated Creatinine Clearance: 26.5 mL/min (by C-G formula based on Cr of 1.6).    Recent Labs  12/16/14 1624 12/16/14 2212 12/17/14 0741  GLUCAP 263* 358* 230*    Medications:  Scheduled:  . amLODipine  5 mg Oral Daily  . atenolol  50 mg Oral Daily  . benazepril  40 mg Oral Daily  . cefTRIAXone (ROCEPHIN)  IV  1 g Intravenous Q24H  . Chlorhexidine Gluconate Cloth  6 each Topical Q0600  . cloNIDine  0.2 mg Oral BID  . collagenase   Topical Daily  . dextrose  25 mL Intravenous Once  . feeding supplement (RESOURCE BREEZE)  1 Container Oral TID WC  . furosemide  20 mg Oral QODAY  . hydrALAZINE  100 mg Oral BID  . insulin aspart  0-9 Units Subcutaneous TID AC & HS  . mirtazapine  15 mg Oral Daily  . mupirocin ointment  1 application Nasal BID  . pantoprazole  40 mg Oral BID  . pravastatin  10 mg Oral q1800  . tamsulosin  0.4 mg Oral BID   Infusions:   PRN: morphine injection, ondansetron (ZOFRAN)  IV   Assessment: Electrolytes are wnl. Corrected Ca is 9.1  Plan:  Will f/u AM labs.   Ramond Dial, Pharm.D Clinical Pharmacist 12/17/2014,9:57 AM

## 2014-12-17 NOTE — Progress Notes (Signed)
Dr Judd Gaudier fleet enema once

## 2014-12-17 NOTE — Progress Notes (Signed)
[place] at [facility]                                                                                                                                                                                            Patient Demographics   Shelley Fields, is a 79 y.o. female, DOB - 1935-12-14, IHK:[id number]  Admit date - 12/12/2014   Admitting Physician Juluis Mire, MD  Outpatient Primary MD for the patient is [name], MD   LOS - 5  Subjective:  No complains, no diarrhea, tolerating liquid diet.  Review of Systems:   CONSTITUTIONAL: No documented fever. No fatigue, weakness. No weight gain, no weight loss.  EYES: No blurry or double vision.  ENT: No tinnitus. No postnasal drip. No redness of the oropharynx.  RESPIRATORY: No cough, no wheeze, no hemoptysis. No dyspnea.  CARDIOVASCULAR: No chest pain. No orthopnea. No palpitations. No syncope.  GASTROINTESTINAL positive nausea, positive vomiting or diarrhea. No abdominal pain. No melena or hematochezia.  GENITOURINARY: No dysuria or hematuria.  ENDOCRINE: No polyuria or nocturia. No heat or cold intolerance.  HEMATOLOGY: Positive anemia. No bruising. No bleeding.  INTEGUMENTARY: No rashes. No lesions.  MUSCULOSKELETAL: No arthritis. No swelling. No gout.  NEUROLOGIC: No numbness, tingling, or ataxia. No seizure-type activity.  PSYCHIATRIC: No anxiety. No insomnia. No ADD.    Vitals:   Filed Vitals:   12/16/14 2104 12/17/14 0500 12/17/14 0511 12/17/14 0958  BP: 143/39  145/49 162/62  Pulse: 55  77 63  Temp:      TempSrc:      Resp:      Height:      Weight:  74.526 kg (164 lb 4.8 oz)    SpO2: 100%       Wt Readings from Last 3 Encounters:  12/17/14 74.526 kg (164 lb 4.8 oz)  11/30/14 70.95 kg (156 lb 6.7 oz)  11/23/14 68.9 kg (151 lb 14.4 oz)     Intake/Output Summary (Last 24 hours) at 12/17/14 1033 Last data filed at 12/17/14 0900  Gross per 24 hour  Intake      0 ml   Output    350 ml  Net   -350 ml    Physical Exam:   GENERAL: Pleasant-appearing in no apparent distress.  HEAD, EYES, EARS, NOSE AND THROAT: Atraumatic, normocephalic. Extraocular muscles are intact. Pupils equal and reactive to light. Sclerae anicteric. No conjunctival injection. No oro-pharyngeal erythema.  NECK: Supple. There is no jugular venous distention. No bruits, no lymphadenopathy, no thyromegaly.  HEART: Regular rate and rhythm, tachycardic. No murmurs, no rubs, no clicks.  LUNGS: Clear to auscultation bilaterally. No rales or rhonchi. No wheezes.  ABDOMEN: Soft, flat, nontender, nondistended. Has good bowel sounds. No hepatosplenomegaly appreciated. Foley in place. EXTREMITIES: No evidence of any cyanosis, clubbing, or peripheral edema.  +2 pedal and radial pulses bilaterally.  NEUROLOGIC: The patient is alert, awake, and oriented x3 with no focal motor or sensory deficits appreciated bilaterally.  SKIN: Moist and warm with no rashes appreciated.  Psych: Not anxious, depressed LN: No inguinal LN enlargement    Antibiotics   Anti-infectives    Start     Dose/Rate Route Frequency Ordered Stop   12/16/14 0000  cephALEXin (KEFLEX) 500 MG capsule     500 mg Oral 4 times daily 12/16/14 1313     12/15/14 1800  cefTRIAXone (ROCEPHIN) 1 g in dextrose 5 % 50 mL IVPB - Premix     1 g 100 mL/hr over 30 Minutes Intravenous Every 24 hours 12/15/14 1426     12/13/14 0500  piperacillin-tazobactam (ZOSYN) IVPB 3.375 g  Status:  Discontinued     3.375 g 12.5 mL/hr over 240 Minutes Intravenous 3 times per day 12/13/14 0104 12/15/14 1426   12/13/14 0330  vancomycin (VANCOCIN) IVPB 750 mg/150 ml premix  Status:  Discontinued     750 mg 150 mL/hr over 60 Minutes Intravenous Every 24 hours 12/13/14 0104 12/14/14 1149   12/12/14 2100  vancomycin (VANCOCIN) IVPB 1000 mg/200 mL premix     1,000 mg 200 mL/hr over 60 Minutes Intravenous  Once 12/12/14 2051 12/12/14 2248   12/12/14 2100   piperacillin-tazobactam (ZOSYN) IVPB 3.375 g     3.375 g 12.5 mL/hr over 240 Minutes Intravenous  Once 12/12/14 2051 12/12/14 2131      Medications   Scheduled Meds: . amLODipine  5 mg Oral Daily  . atenolol  50 mg Oral Daily  . benazepril  40 mg Oral Daily  . cefTRIAXone (ROCEPHIN)  IV  1 g Intravenous Q24H  . Chlorhexidine Gluconate Cloth  6 each Topical Q0600  . cloNIDine  0.2 mg Oral BID  . collagenase   Topical Daily  . dextrose  25 mL Intravenous Once  . feeding supplement (RESOURCE BREEZE)  1 Container Oral TID WC  . furosemide  20 mg Oral QODAY  . hydrALAZINE  100 mg Oral BID  . insulin aspart  0-9 Units Subcutaneous TID AC & HS  . mirtazapine  15 mg Oral Daily  . mupirocin ointment  1 application Nasal BID  . pantoprazole  40 mg Oral BID  . pravastatin  10 mg Oral q1800  . tamsulosin  0.4 mg Oral BID   Continuous Infusions:   PRN Meds:.   Data Review:   Micro Results Recent Results (from the past 240 hour(s))  Blood culture (routine x 2)     Status: None   Collection Time: 12/12/14  7:35 PM  Result Value Ref Range Status   Specimen Description BLOOD  Final   Special Requests Normal  Final   Culture NO GROWTH 5 DAYS  Final   Report Status 12/17/2014 FINAL  Final  Blood culture (routine x 2)     Status: None   Collection Time: 12/12/14  8:18 PM  Result Value Ref Range Status   Specimen Description BLOOD RESISTANT HEEL OF FOOT  Final   Special Requests Normal  Final   Culture NO GROWTH 5 DAYS  Final   Report Status 12/17/2014 FINAL  Final  Urine culture     Status: None  Collection Time: 12/13/14  1:20 AM  Result Value Ref Range Status   Specimen Description URINE, CATHETERIZED  Final   Special Requests Immunocompromised  Final   Culture >=100,000 COLONIES/mL KLEBSIELLA PNEUMONIAE  Final   Report Status 12/15/2014 FINAL  Final   Organism ID, Bacteria KLEBSIELLA PNEUMONIAE  Final      Susceptibility   Klebsiella pneumoniae - MIC*    AMPICILLIN 8  RESISTANT Resistant     CEFTAZIDIME <=1 SENSITIVE Sensitive     CEFAZOLIN <=4 SENSITIVE Sensitive     CEFTRIAXONE <=1 SENSITIVE Sensitive     CIPROFLOXACIN <=0.25 SENSITIVE Sensitive     GENTAMICIN <=1 SENSITIVE Sensitive     IMIPENEM <=0.25 SENSITIVE Sensitive     TRIMETH/SULFA <=20 SENSITIVE Sensitive     * >=100,000 COLONIES/mL KLEBSIELLA PNEUMONIAE  MRSA PCR Screening     Status: Abnormal   Collection Time: 12/13/14  1:30 AM  Result Value Ref Range Status   MRSA by PCR POSITIVE (A) NEGATIVE Final    Comment:        The GeneXpert MRSA Assay (FDA approved for NASAL specimens only), is one component of a comprehensive MRSA colonization surveillance program. It is not intended to diagnose MRSA infection nor to guide or monitor treatment for MRSA infections. CRITICAL RESULT CALLED TO, READ BACK BY AND VERIFIED WITH: RENEE BABB @[id number]  12/13/14 BY AJO     Radiology Reports Ct Abdomen Pelvis Wo Contrast  11/21/2014   CLINICAL DATA:  Restaging endometrial carcinoma metastatic to pelvic side wall. Suspected hepatic metastatic disease. Previous personal history of cervical cancer with radiation therapy. Subsequent encounter.  EXAM: CT ABDOMEN AND PELVIS WITHOUT CONTRAST  TECHNIQUE: Multidetector CT imaging of the abdomen and pelvis was performed following the standard protocol without IV contrast.  COMPARISON:  Abdominal pelvic CT 06/18/2014.  Chest CT 01/25/2012.  FINDINGS: Lower chest: There is persistent concern of an enlarging lingular nodule, measuring 2.0 cm on image 1. This is incompletely visualized. The right lung base appears clear. No other suspicious nodules identified. There is no significant residual pleural fluid. A small pericardial effusion and a small hiatal hernia are noted. Probable presternal sebaceous cyst appears unchanged.  Hepatobiliary: There is progressive diffuse hepatic steatosis. No focal lesions are identified on noncontrast imaging. The sensitivity of noncontrast  CT for focal liver disease is decreased in the setting of hepatic steatosis. There are multiple gallstones. No significant biliary dilatation identified.  Pancreas:  Atrophied without focal abnormality.  Spleen: Normal in size without suspicious focal abnormality. Multiple small calcified granulomas noted.  Adrenals/Urinary Tract: Both adrenal glands appear normal.The kidneys appear stable with cortical thinning and atrophy. There are renal vascular calcifications, but no hydronephrosis. 5 mm hyperdense lesion in the lower pole of the right kidney on image 36 is stable. There is anterior bladder wall thickening with a small amount of gas within the bladder lumen. No focal mass lesion identified.  Stomach/Bowel: No evidence of bowel wall thickening, distention or surrounding inflammatory change.Mild colonic diverticular changes are stable.  Vascular/Lymphatic: There is diffuse atherosclerosis of the aorta, its branches and the iliac arteries. Iliac stents are present There has been continued progression in extensive retroperitoneal lymphadenopathy. Pre caval node measures 4.2 x 3.6 cm on image 30 (previously 3.8 x 3.5 cm). Left periaortic node measures 3.6 x 2.6 cm on image 36 (previously 3.0 x 2.4 cm). Enlarged retroperitoneal lymph nodes extend into the false pelvis. There is no adenopathy in the lower pelvis.  Reproductive: Status post hysterectomy. No evidence  of adnexal mass.  Other: Stable postsurgical changes within the anterior abdominal wall with absent subcutaneous fat inferiorly.  Musculoskeletal: No acute or significant osseous findings. Stable degenerative changes throughout the spine.  IMPRESSION: 1. Progressive nodal metastatic disease throughout the retroperitoneum. 2. No evidence of extra nodal metastases in the abdomen or pelvis. Hepatic evaluation is limited by noncontrast imaging, especially in the setting of progressive hepatic steatosis. 3. Persistent concern of lingular nodule, incompletely  visualized. This could reflect a metastasis or primary bronchogenic carcinoma. The patient has no recent chest CT for correlation. Full chest CT recommended for further evaluation. 4. Stable incidental findings including bilateral renal atrophy, diffuse atherosclerosis and cholelithiasis.   Electronically Signed   By: Richardean Sale M.D.   On: 11/21/2014 10:56   Dg Chest 2 View  11/18/2014   CLINICAL DATA:  Altered mental status  EXAM: CHEST  2 VIEW  COMPARISON:  07/31/2014  FINDINGS: Right midlung pulmonary nodule is again evident without significant interval change, measuring 14 mm. The lungs are otherwise clear. Pulmonary vasculature is normal. There are no large pleural effusions.  There is a right subclavian Port-A-Cath with tip in the SVC.  There are calcified nodes in the right paratracheal region, unchanged.  IMPRESSION: Unchanged 14 mm pulmonary nodule on the right.  No acute cardiopulmonary findings.   Electronically Signed   By: Andreas Newport M.D.   On: 11/18/2014 00:31   Ct Head Wo Contrast  11/18/2014   CLINICAL DATA:  Poor historian, abnormal behavior. History of diabetes an uterine cancer.  EXAM: CT HEAD WITHOUT CONTRAST  TECHNIQUE: Contiguous axial images were obtained from the base of the skull through the vertex without intravenous contrast.  COMPARISON:  CT head report dated Oct 27, 2012 though images are not available for direct comparison.  FINDINGS: The ventricles and sulci are normal for age. No intraparenchymal hemorrhage, mass effect nor midline shift. Patchy supratentorial white matter hypodensities are less than expected for patient's age and though non-specific suggest sequelae of chronic small vessel ischemic disease. No acute large vascular territory infarcts. Tiny hypodensities in the bilateral basal ganglia.  No abnormal extra-axial fluid collections. Basal cisterns are patent. Moderate calcific atherosclerosis of the carotid siphons.  No skull fracture. The included ocular  globes and orbital contents are non-suspicious. Bilateral ocular lens implants. The mastoid aircells and included paranasal sinuses are well-aerated.  IMPRESSION: No acute intracranial process  Involutional changes. Bilateral basal ganglia perivascular spaces and/or lacunar infarcts.  Mild to moderate white matter changes most consistent chronic small vessel ischemic disease.   Electronically Signed   By: Elon Alas M.D.   On: 11/18/2014 00:37   Dg Chest Portable 1 View  12/12/2014   CLINICAL DATA:  Vomiting today.  States for uterine cancer.  EXAM: PORTABLE CHEST - 1 VIEW  COMPARISON:  PA and lateral chest 11/17/2014.  FINDINGS: 1.4 cm nodule projecting in the right mid lung is unchanged. The lungs are otherwise clear. Heart size is normal. Calcified mediastinal lymph nodes are noted.  IMPRESSION: No acute disease.  No change in a 1.4 cm in pulmonary nodule on the right.   Electronically Signed   By: Inge Rise M.D.   On: 12/12/2014 21:39     CBC  Recent Labs Lab 12/12/14 1955 12/13/14 0740 12/13/14 1900 12/14/14 0624 12/15/14 0522  WBC 16.6* 11.4* 10.0 10.5 11.2*  HGB 3.0* 11.8* 12.1 12.1 11.9*  HCT 10.0* 36.0 36.6 37.3 37.0  PLT 413 285 285 275 260  MCV  86.9 82.7 82.6 83.2 84.5  MCH 25.6* 27.0 27.2 26.9 27.2  MCHC 29.5* 32.6 32.9 32.3 32.2  RDW 19.9* 16.7* 17.3* 17.6* 18.1*  LYMPHSABS 3.1  --  2.9  --   --   MONOABS 0.7  --  0.7  --   --   EOSABS 0.0  --  0.3  --   --   BASOSABS 0.1  --  0.1  --   --     Chemistries   Recent Labs Lab 12/12/14 1955  12/13/14 1845 12/14/14 0624 12/15/14 0514 12/15/14 0522 12/16/14 0553 12/17/14 0542  NA 132*  < > 141 142  --  136 135 135  K 3.9  < > 3.5 3.3*  --  3.8 3.6 3.7  CL 101  < > 115* 114*  --  109 111 110  CO2 17*  < > 20* 20*  --  17* 19* 19*  GLUCOSE 894*  < > 127* 158*  --  220* 213* 266*  BUN 38*  < > 22* 22*  --  22* 25* 28*  CREATININE 1.47*  < > 1.15* 1.22* 1.39* 1.34* 1.52* 1.60*  CALCIUM 7.8*  < > 7.7*  7.8*  --  7.4* 7.4* 7.4*  MG  --   --   --   --  1.2*  --  2.1 2.0  AST 18  --   --   --   --   --   --   --   ALT 10*  --   --   --   --   --   --   --   ALKPHOS 43  --   --   --   --   --   --   --   BILITOT 0.4  --   --   --   --   --   --   --   < > = values in this interval not displayed. ------------------------------------------------------------------------------------------------------------------ estimated creatinine clearance is 26.5 mL/min (by C-G formula based on Cr of 1.6). ------------------------------------------------------------------------------------------------------------------ No results for input(s): HGBA1C in the last 72 hours. ------------------------------------------------------------------------------------------------------------------ No results for input(s): CHOL, HDL, LDLCALC, TRIG, CHOLHDL, LDLDIRECT in the last 72 hours. ------------------------------------------------------------------------------------------------------------------ No results for input(s): TSH, T4TOTAL, T3FREE, THYROIDAB in the last 72 hours.  Invalid input(s): FREET3 ------------------------------------------------------------------------------------------------------------------ No results for input(s): VITAMINB12, FOLATE, FERRITIN, TIBC, IRON, RETICCTPCT in the last 72 hours.  Coagulation profile  Recent Labs Lab 12/12/14 1955 12/14/14 0625 12/15/14 0514  INR 1.37 2.11 2.79    No results for input(s): DDIMER in the last 72 hours.  Cardiac Enzymes  Recent Labs Lab 12/12/14 1955  TROPONINI <0.03   ------------------------------------------------------------------------------------------------------------------ Invalid input(s): POCBNP    Assessment & Plan   1.  Anemia possible secondary  to GI bleed. Status post transfusion continue Protonix drip d/c after EGD.   EGD showed mild gastropathy involving antrum. Hb stable. Likely the first result with very low Hb was a  mistake. 2. GI bleed- reviewed EGD results, appreciated GI consult,.    If bleeds again- may need colonoscopy.     Will upgrade the diet. 3. Uncontrolled diabetes mellitus type 2 with severe hyperglycemia, no DKA. On insulin 4. sepsis, due to UTI, stage II decubitus ulcer sacral region. Continue aggressive IV anabiotic's with Zosyn DC Vanco - awaiting urine cx report. 5. Metastatic uterine cancer, currently not under any treatment. On chronic foley. 6. Hypertension, stable continue atenolol and clonidine 7. Hyperlipidemia, continue pravastatin 8. Code  DO NOT RESUSCITATE appreciated palliative care input 9. Hx of DVT- resume coumadin.  Initially tried to d/c home with home health- but due to functional decline she will need to go to rehab- likely today.     Code Status Orders        Start     Ordered   12/13/14 0056   DNR   12/13/14 0055    Advance Directive Documentation        Most Recent Value   Type of Advance Directive  Healthcare Power of Attorney   Pre-existing out of facility DNR order (yellow form or pink MOST form)     "MOST" Form in Place?        Consults  GI DVT Prophylaxis  SCDs and let a GI bleed  Lab Results  Component Value Date   PLT 260 12/15/2014    Time Spent in minutes  76min   Greater than 50% of time spent in care coordination and counseling.   Vaughan Basta M.D on 12/17/2014 at 10:33 AM  Between 7am to 6pm - Pager - [phone]  After 6pm go to [email or web address] - password [email or web address] [place] [facility]   Office  [phone]

## 2014-12-17 NOTE — Clinical Social Work Note (Signed)
Clinical Social Work Assessment  Patient Details  Name: Shelley Fields MRN: [id number] Date of Birth: 1935-12-01  Date of referral:  12/17/14               Reason for consult:  Facility Placement                Permission sought to share information with:  Family Supports Permission granted to share information::  Yes, Verbal Permission Granted  Name::      [name], sister)  Housing/Transportation Living arrangements for the past 2 months:  [place] of Information:  Patient, Other (Comment Required) (sibling) Patient Interpreter Needed:  None Criminal Activity/Legal Involvement Pertinent to Current Situation/Hospitalization:  No - Comment as needed Significant Relationships:  Siblings Lives with:  Siblings Do you feel safe going back to the place where you live?  Yes Need for family participation in patient care:  Yes (Comment)  Care giving concerns:  Pt is not at her baseline, per pt's sister.   Social Worker assessment / plan:  CSW met with pt to address consult for SNF placement. Per pt's sister she is not her baseline. CSW introduced herself and explained role of social work. CSW also explained process of discharging to SNF. Pt lives with sister. Pt has been to SNF in teh past, however does not want to return to previous facility. CSW spoke with pt's sister, with pt's permission. Pt's sister is agreeable as well. CSW initiated bed search and followed up with bed offers. [facility] was chosen. Pt and sister are in agreement. FL2 signed by MD. RN will call report and EMS will provide transportation. CSW is signing off as no further needs identified.   Employment status:  Retired Programmer, applications PT Recommendations:  [place] / Referral to community resources:  [place]  Patient/Family's Response to care:  Pt and sister were pleasant and agreeable to discharge plan.   Patient/Family's  Understanding of and Emotional Response to Diagnosis, Current Treatment, and Prognosis:  Pt understands that she needs some STR at SNF prior to returning home as she is not at her baseline.   Emotional Assessment Appearance:  Appears older than stated age Attitude/Demeanor/Rapport:  Other (Appropriate) Affect (typically observed):  Accepting, Adaptable Orientation:  Oriented to Self, Oriented to Place, Oriented to  Time, Oriented to Situation Alcohol / Substance use:  Never Used Psych involvement (Current and /or in the community):  No (Comment)  Discharge Needs  Concerns to be addressed:  No discharge needs identified Readmission within the last 30 days:  No Current discharge risk:  None Barriers to Discharge:  No Barriers Identified   Darden Dates, LCSW 12/17/2014, 12:34 PM

## 2014-12-17 NOTE — Plan of Care (Signed)
Problem: Discharge Progression Outcomes Goal: Other Discharge Outcomes/Goals Outcome: Progressing A&O patient with intermittent confusion, denies pain. No signs of bleeding. Hgb stable.VSS. Tolerating diet, denies nausea/vomiting. Foley intact. Dressings dry and intact, was to discharge yesterday family does not feel she is able to return home at this point. likely discharge date 7/12

## 2014-12-17 NOTE — Progress Notes (Addendum)
Patient discharged to [facility]. VSS. Report given to [place] at facility. RN notified that last BM was 12/14/14 and enema was given today with no results. EMS called for transport.

## 2014-12-17 NOTE — Progress Notes (Signed)
Inpatient Diabetes Program Recommendations  AACE/ADA: New Consensus Statement on Inpatient Glycemic Control (2013)  Target Ranges:  Prepandial:   less than 140 mg/dL      [place] postprandial:   less than 180 mg/dL (1-2 hours)      Critically ill patients:  140 - 180 mg/dL   Results for Shelley Fields, Shelley Fields (MRN [id number]) as of 12/17/2014 09:11  Ref. Range 12/16/2014 07:40 12/16/2014 11:07 12/16/2014 16:24 12/16/2014 22:12 12/17/2014 07:41  Glucose-Capillary Latest Ref Range: 65-99 mg/dL 264 (H) 321 (H) 263 (H) 358 (H) 230 (H)   Diabetes history: DM2 Outpatient Diabetes medications: 70/30 16 units daily at 6pm Current orders for Inpatient glycemic control: Novolog 0-9 units ACHS  Inpatient Diabetes Program Recommendations Insulin - Basal: Glucose ranged from 263-358 mg/dl on 7/10 and patient received a total of Novolog 26 units for correction on 7/10. Fasting glucose is 230 mg/dl this morning. While inpatient please consider ordering Lantus 14 units daily starting now (based on 74 kg x 0.2 units). Correction (SSI): Please consider increasing Novolog correction to moderate scale.  Thanks, Barnie Alderman, RN, MSN, CCRN, CDE Diabetes Coordinator Inpatient Diabetes Program [phone] (Team Pager from [place] to [place]) [phone] (AP office) [phone] [facility] office) [phone] [facility] office)

## 2014-12-18 DIAGNOSIS — E119 Type 2 diabetes mellitus without complications: Secondary | ICD-10-CM | POA: Diagnosis not present

## 2014-12-18 DIAGNOSIS — N393 Stress incontinence (female) (male): Secondary | ICD-10-CM | POA: Diagnosis not present

## 2014-12-18 DIAGNOSIS — L89622 Pressure ulcer of left heel, stage 2: Secondary | ICD-10-CM | POA: Diagnosis not present

## 2014-12-18 DIAGNOSIS — I5032 Chronic diastolic (congestive) heart failure: Secondary | ICD-10-CM | POA: Diagnosis not present

## 2014-12-18 DIAGNOSIS — M25552 Pain in left hip: Secondary | ICD-10-CM | POA: Diagnosis not present

## 2014-12-18 DIAGNOSIS — L89322 Pressure ulcer of left buttock, stage 2: Secondary | ICD-10-CM | POA: Diagnosis not present

## 2014-12-18 DIAGNOSIS — L989 Disorder of the skin and subcutaneous tissue, unspecified: Secondary | ICD-10-CM | POA: Diagnosis not present

## 2014-12-18 DIAGNOSIS — D649 Anemia, unspecified: Secondary | ICD-10-CM | POA: Diagnosis not present

## 2014-12-18 DIAGNOSIS — I10 Essential (primary) hypertension: Secondary | ICD-10-CM | POA: Diagnosis not present

## 2014-12-18 DIAGNOSIS — L89312 Pressure ulcer of right buttock, stage 2: Secondary | ICD-10-CM | POA: Diagnosis not present

## 2014-12-18 DIAGNOSIS — I739 Peripheral vascular disease, unspecified: Secondary | ICD-10-CM | POA: Diagnosis not present

## 2014-12-18 DIAGNOSIS — F328 Other depressive episodes: Secondary | ICD-10-CM | POA: Diagnosis not present

## 2014-12-18 DIAGNOSIS — I4891 Unspecified atrial fibrillation: Secondary | ICD-10-CM | POA: Diagnosis not present

## 2014-12-19 DIAGNOSIS — L89312 Pressure ulcer of right buttock, stage 2: Secondary | ICD-10-CM | POA: Diagnosis not present

## 2014-12-19 DIAGNOSIS — L89622 Pressure ulcer of left heel, stage 2: Secondary | ICD-10-CM | POA: Diagnosis not present

## 2014-12-19 DIAGNOSIS — I10 Essential (primary) hypertension: Secondary | ICD-10-CM | POA: Diagnosis not present

## 2014-12-19 DIAGNOSIS — E119 Type 2 diabetes mellitus without complications: Secondary | ICD-10-CM | POA: Diagnosis not present

## 2014-12-19 DIAGNOSIS — F328 Other depressive episodes: Secondary | ICD-10-CM | POA: Diagnosis not present

## 2014-12-19 DIAGNOSIS — D649 Anemia, unspecified: Secondary | ICD-10-CM | POA: Diagnosis not present

## 2014-12-19 DIAGNOSIS — I4891 Unspecified atrial fibrillation: Secondary | ICD-10-CM | POA: Diagnosis not present

## 2014-12-19 DIAGNOSIS — I5032 Chronic diastolic (congestive) heart failure: Secondary | ICD-10-CM | POA: Diagnosis not present

## 2014-12-19 DIAGNOSIS — L989 Disorder of the skin and subcutaneous tissue, unspecified: Secondary | ICD-10-CM | POA: Diagnosis not present

## 2014-12-19 DIAGNOSIS — M25552 Pain in left hip: Secondary | ICD-10-CM | POA: Diagnosis not present

## 2014-12-19 DIAGNOSIS — N393 Stress incontinence (female) (male): Secondary | ICD-10-CM | POA: Diagnosis not present

## 2014-12-19 DIAGNOSIS — L89322 Pressure ulcer of left buttock, stage 2: Secondary | ICD-10-CM | POA: Diagnosis not present

## 2014-12-19 DIAGNOSIS — I739 Peripheral vascular disease, unspecified: Secondary | ICD-10-CM | POA: Diagnosis not present

## 2014-12-19 LAB — SURGICAL PATHOLOGY

## 2014-12-20 ENCOUNTER — Other Ambulatory Visit: Payer: Self-pay | Admitting: *Deleted

## 2014-12-20 ENCOUNTER — Telehealth: Payer: Self-pay | Admitting: *Deleted

## 2014-12-20 ENCOUNTER — Ambulatory Visit: Payer: Self-pay | Admitting: Family Medicine

## 2014-12-20 DIAGNOSIS — I739 Peripheral vascular disease, unspecified: Secondary | ICD-10-CM | POA: Diagnosis not present

## 2014-12-20 DIAGNOSIS — L989 Disorder of the skin and subcutaneous tissue, unspecified: Secondary | ICD-10-CM | POA: Diagnosis not present

## 2014-12-20 DIAGNOSIS — I10 Essential (primary) hypertension: Secondary | ICD-10-CM | POA: Diagnosis not present

## 2014-12-20 DIAGNOSIS — I5032 Chronic diastolic (congestive) heart failure: Secondary | ICD-10-CM | POA: Diagnosis not present

## 2014-12-20 DIAGNOSIS — L89622 Pressure ulcer of left heel, stage 2: Secondary | ICD-10-CM | POA: Diagnosis not present

## 2014-12-20 DIAGNOSIS — M25552 Pain in left hip: Secondary | ICD-10-CM | POA: Diagnosis not present

## 2014-12-20 DIAGNOSIS — E119 Type 2 diabetes mellitus without complications: Secondary | ICD-10-CM | POA: Diagnosis not present

## 2014-12-20 DIAGNOSIS — D649 Anemia, unspecified: Secondary | ICD-10-CM | POA: Diagnosis not present

## 2014-12-20 DIAGNOSIS — F328 Other depressive episodes: Secondary | ICD-10-CM | POA: Diagnosis not present

## 2014-12-20 DIAGNOSIS — L89312 Pressure ulcer of right buttock, stage 2: Secondary | ICD-10-CM | POA: Diagnosis not present

## 2014-12-20 DIAGNOSIS — N393 Stress incontinence (female) (male): Secondary | ICD-10-CM | POA: Diagnosis not present

## 2014-12-20 DIAGNOSIS — L89322 Pressure ulcer of left buttock, stage 2: Secondary | ICD-10-CM | POA: Diagnosis not present

## 2014-12-20 DIAGNOSIS — I4891 Unspecified atrial fibrillation: Secondary | ICD-10-CM | POA: Diagnosis not present

## 2014-12-20 MED ORDER — WARFARIN SODIUM 1 MG PO TABS: 0.5000 mg | ORAL_TABLET | Freq: Every day | ORAL | Status: DC

## 2014-12-20 NOTE — Telephone Encounter (Signed)
I don't think this prescription is correct on Shelley Fields.  Let's talk about it.  M

## 2014-12-20 NOTE — Telephone Encounter (Signed)
I faxed orders for a weekly PT/INR to [facility]; [place] [address] at [phone]; pH. [phone].Marland KitchenMarland Kitchen

## 2014-12-21 DIAGNOSIS — N390 Urinary tract infection, site not specified: Secondary | ICD-10-CM | POA: Diagnosis not present

## 2014-12-21 DIAGNOSIS — K922 Gastrointestinal hemorrhage, unspecified: Secondary | ICD-10-CM | POA: Diagnosis not present

## 2014-12-21 DIAGNOSIS — C541 Malignant neoplasm of endometrium: Secondary | ICD-10-CM | POA: Diagnosis not present

## 2014-12-26 ENCOUNTER — Telehealth: Payer: Self-pay | Admitting: Hematology and Oncology

## 2014-12-26 NOTE — Telephone Encounter (Signed)
Re:  Progress at [facility]   I was informed by Cyril Mourning that patient not making progress in rehab.  She previously was in [facility] x 100 days with lack of progress.  She was then an inpatient.  Patient has a large decubitus ulcer.  Patient not working with rehab.  Patient not a candidate for chemotherapy.  Her currently symptoms are not secondary to her malignancy.  As previously discussed, she appears to be a candidate for supportive care, Hospice.  Both the daughter and I have discussed this in the past.  I provided Cyril Mourning my office number if her daughter had questions or concerns.  Lequita Asal, MD

## 2014-12-26 NOTE — Telephone Encounter (Signed)
-----   Message from Arlice Colt, RN sent at 12/25/2014  2:50 PM EDT ----- Regarding: palliative care Kristen from [place] [facility] called to discuss this patient's plan of care with you. Please call her back at [phone].  I could not answer all of her questions.

## 2014-12-27 ENCOUNTER — Telehealth: Payer: Self-pay | Admitting: Hematology and Oncology

## 2014-12-27 NOTE — Telephone Encounter (Signed)
She would like to speak with Dr. Mike Gip about whatever she discussed with Darrick Meigs, NP, at the nursing home yesterday. She would prefer that Dr. Mike Gip call her after 3pm when she will be with her aunt. It's okay to call in evening. Thanks.

## 2015-01-08 ENCOUNTER — Telehealth: Payer: Self-pay | Admitting: *Deleted

## 2015-01-08 NOTE — Telephone Encounter (Signed)
  Let's set her up to see me next week.  M

## 2015-01-08 NOTE — Telephone Encounter (Signed)
Called to see if Dr Mike Gip wants to see patient and have labs drawn. She is currently in a facility. OK to wait for answer until next week

## 2015-01-09 DIAGNOSIS — I4891 Unspecified atrial fibrillation: Secondary | ICD-10-CM | POA: Diagnosis not present

## 2015-01-09 DIAGNOSIS — K922 Gastrointestinal hemorrhage, unspecified: Secondary | ICD-10-CM | POA: Diagnosis not present

## 2015-01-09 DIAGNOSIS — Z7901 Long term (current) use of anticoagulants: Secondary | ICD-10-CM | POA: Diagnosis not present

## 2015-01-09 DIAGNOSIS — C55 Malignant neoplasm of uterus, part unspecified: Secondary | ICD-10-CM | POA: Diagnosis not present

## 2015-01-10 DIAGNOSIS — M1990 Unspecified osteoarthritis, unspecified site: Secondary | ICD-10-CM | POA: Diagnosis not present

## 2015-01-10 DIAGNOSIS — I10 Essential (primary) hypertension: Secondary | ICD-10-CM | POA: Diagnosis not present

## 2015-01-10 DIAGNOSIS — Z8551 Personal history of malignant neoplasm of bladder: Secondary | ICD-10-CM | POA: Diagnosis not present

## 2015-01-10 DIAGNOSIS — K922 Gastrointestinal hemorrhage, unspecified: Secondary | ICD-10-CM | POA: Diagnosis not present

## 2015-01-10 DIAGNOSIS — K219 Gastro-esophageal reflux disease without esophagitis: Secondary | ICD-10-CM | POA: Diagnosis not present

## 2015-01-10 DIAGNOSIS — E785 Hyperlipidemia, unspecified: Secondary | ICD-10-CM | POA: Diagnosis not present

## 2015-01-10 DIAGNOSIS — C55 Malignant neoplasm of uterus, part unspecified: Secondary | ICD-10-CM | POA: Diagnosis not present

## 2015-01-10 DIAGNOSIS — D649 Anemia, unspecified: Secondary | ICD-10-CM | POA: Diagnosis not present

## 2015-01-10 DIAGNOSIS — E1165 Type 2 diabetes mellitus with hyperglycemia: Secondary | ICD-10-CM | POA: Diagnosis not present

## 2015-01-10 DIAGNOSIS — Z8541 Personal history of malignant neoplasm of cervix uteri: Secondary | ICD-10-CM | POA: Diagnosis not present

## 2015-01-11 DIAGNOSIS — M1990 Unspecified osteoarthritis, unspecified site: Secondary | ICD-10-CM | POA: Diagnosis not present

## 2015-01-11 DIAGNOSIS — E1165 Type 2 diabetes mellitus with hyperglycemia: Secondary | ICD-10-CM | POA: Diagnosis not present

## 2015-01-11 DIAGNOSIS — Z8551 Personal history of malignant neoplasm of bladder: Secondary | ICD-10-CM | POA: Diagnosis not present

## 2015-01-11 DIAGNOSIS — I10 Essential (primary) hypertension: Secondary | ICD-10-CM | POA: Diagnosis not present

## 2015-01-11 DIAGNOSIS — K219 Gastro-esophageal reflux disease without esophagitis: Secondary | ICD-10-CM | POA: Diagnosis not present

## 2015-01-11 DIAGNOSIS — C55 Malignant neoplasm of uterus, part unspecified: Secondary | ICD-10-CM | POA: Diagnosis not present

## 2015-01-11 DIAGNOSIS — Z8541 Personal history of malignant neoplasm of cervix uteri: Secondary | ICD-10-CM | POA: Diagnosis not present

## 2015-01-11 DIAGNOSIS — E785 Hyperlipidemia, unspecified: Secondary | ICD-10-CM | POA: Diagnosis not present

## 2015-01-12 DIAGNOSIS — E1165 Type 2 diabetes mellitus with hyperglycemia: Secondary | ICD-10-CM | POA: Diagnosis not present

## 2015-01-12 DIAGNOSIS — K219 Gastro-esophageal reflux disease without esophagitis: Secondary | ICD-10-CM | POA: Diagnosis not present

## 2015-01-12 DIAGNOSIS — C55 Malignant neoplasm of uterus, part unspecified: Secondary | ICD-10-CM | POA: Diagnosis not present

## 2015-01-12 DIAGNOSIS — E785 Hyperlipidemia, unspecified: Secondary | ICD-10-CM | POA: Diagnosis not present

## 2015-01-12 DIAGNOSIS — Z8541 Personal history of malignant neoplasm of cervix uteri: Secondary | ICD-10-CM | POA: Diagnosis not present

## 2015-01-12 DIAGNOSIS — M1990 Unspecified osteoarthritis, unspecified site: Secondary | ICD-10-CM | POA: Diagnosis not present

## 2015-01-12 DIAGNOSIS — Z8551 Personal history of malignant neoplasm of bladder: Secondary | ICD-10-CM | POA: Diagnosis not present

## 2015-01-12 DIAGNOSIS — I10 Essential (primary) hypertension: Secondary | ICD-10-CM | POA: Diagnosis not present

## 2015-01-13 DIAGNOSIS — Z8551 Personal history of malignant neoplasm of bladder: Secondary | ICD-10-CM | POA: Diagnosis not present

## 2015-01-13 DIAGNOSIS — E1165 Type 2 diabetes mellitus with hyperglycemia: Secondary | ICD-10-CM | POA: Diagnosis not present

## 2015-01-13 DIAGNOSIS — I10 Essential (primary) hypertension: Secondary | ICD-10-CM | POA: Diagnosis not present

## 2015-01-13 DIAGNOSIS — M1990 Unspecified osteoarthritis, unspecified site: Secondary | ICD-10-CM | POA: Diagnosis not present

## 2015-01-13 DIAGNOSIS — C55 Malignant neoplasm of uterus, part unspecified: Secondary | ICD-10-CM | POA: Diagnosis not present

## 2015-01-13 DIAGNOSIS — K219 Gastro-esophageal reflux disease without esophagitis: Secondary | ICD-10-CM | POA: Diagnosis not present

## 2015-01-13 DIAGNOSIS — E785 Hyperlipidemia, unspecified: Secondary | ICD-10-CM | POA: Diagnosis not present

## 2015-01-13 DIAGNOSIS — Z8541 Personal history of malignant neoplasm of cervix uteri: Secondary | ICD-10-CM | POA: Diagnosis not present

## 2015-01-14 DIAGNOSIS — Z8551 Personal history of malignant neoplasm of bladder: Secondary | ICD-10-CM | POA: Diagnosis not present

## 2015-01-14 DIAGNOSIS — E785 Hyperlipidemia, unspecified: Secondary | ICD-10-CM | POA: Diagnosis not present

## 2015-01-14 DIAGNOSIS — I10 Essential (primary) hypertension: Secondary | ICD-10-CM | POA: Diagnosis not present

## 2015-01-14 DIAGNOSIS — K219 Gastro-esophageal reflux disease without esophagitis: Secondary | ICD-10-CM | POA: Diagnosis not present

## 2015-01-14 DIAGNOSIS — Z8541 Personal history of malignant neoplasm of cervix uteri: Secondary | ICD-10-CM | POA: Diagnosis not present

## 2015-01-14 DIAGNOSIS — C55 Malignant neoplasm of uterus, part unspecified: Secondary | ICD-10-CM | POA: Diagnosis not present

## 2015-01-14 DIAGNOSIS — E1165 Type 2 diabetes mellitus with hyperglycemia: Secondary | ICD-10-CM | POA: Diagnosis not present

## 2015-01-14 DIAGNOSIS — M1990 Unspecified osteoarthritis, unspecified site: Secondary | ICD-10-CM | POA: Diagnosis not present

## 2015-01-15 ENCOUNTER — Inpatient Hospital Stay (HOSPITAL_BASED_OUTPATIENT_CLINIC_OR_DEPARTMENT_OTHER): Admitting: Hematology and Oncology

## 2015-01-15 ENCOUNTER — Encounter: Payer: Self-pay | Admitting: Hematology and Oncology

## 2015-01-15 ENCOUNTER — Inpatient Hospital Stay: Attending: Hematology and Oncology

## 2015-01-15 VITALS — BP 155/79 | HR 4 | Temp 98.2°F | Resp 18

## 2015-01-15 DIAGNOSIS — Z79899 Other long term (current) drug therapy: Secondary | ICD-10-CM | POA: Diagnosis not present

## 2015-01-15 DIAGNOSIS — Z9221 Personal history of antineoplastic chemotherapy: Secondary | ICD-10-CM

## 2015-01-15 DIAGNOSIS — K219 Gastro-esophageal reflux disease without esophagitis: Secondary | ICD-10-CM | POA: Diagnosis not present

## 2015-01-15 DIAGNOSIS — C541 Malignant neoplasm of endometrium: Secondary | ICD-10-CM | POA: Diagnosis not present

## 2015-01-15 DIAGNOSIS — R531 Weakness: Secondary | ICD-10-CM | POA: Insufficient documentation

## 2015-01-15 DIAGNOSIS — R5383 Other fatigue: Secondary | ICD-10-CM | POA: Insufficient documentation

## 2015-01-15 DIAGNOSIS — Z8541 Personal history of malignant neoplasm of cervix uteri: Secondary | ICD-10-CM | POA: Diagnosis not present

## 2015-01-15 DIAGNOSIS — Z90710 Acquired absence of both cervix and uterus: Secondary | ICD-10-CM | POA: Diagnosis not present

## 2015-01-15 DIAGNOSIS — R32 Unspecified urinary incontinence: Secondary | ICD-10-CM

## 2015-01-15 DIAGNOSIS — Z86711 Personal history of pulmonary embolism: Secondary | ICD-10-CM | POA: Diagnosis not present

## 2015-01-15 DIAGNOSIS — Z171 Estrogen receptor negative status [ER-]: Secondary | ICD-10-CM

## 2015-01-15 DIAGNOSIS — E119 Type 2 diabetes mellitus without complications: Secondary | ICD-10-CM | POA: Insufficient documentation

## 2015-01-15 DIAGNOSIS — D801 Nonfamilial hypogammaglobulinemia: Secondary | ICD-10-CM | POA: Insufficient documentation

## 2015-01-15 DIAGNOSIS — E780 Pure hypercholesterolemia: Secondary | ICD-10-CM

## 2015-01-15 DIAGNOSIS — Z7901 Long term (current) use of anticoagulants: Secondary | ICD-10-CM | POA: Insufficient documentation

## 2015-01-15 DIAGNOSIS — Z8551 Personal history of malignant neoplasm of bladder: Secondary | ICD-10-CM | POA: Diagnosis not present

## 2015-01-15 DIAGNOSIS — L89159 Pressure ulcer of sacral region, unspecified stage: Secondary | ICD-10-CM | POA: Diagnosis not present

## 2015-01-15 DIAGNOSIS — M129 Arthropathy, unspecified: Secondary | ICD-10-CM | POA: Insufficient documentation

## 2015-01-15 DIAGNOSIS — C55 Malignant neoplasm of uterus, part unspecified: Secondary | ICD-10-CM | POA: Diagnosis not present

## 2015-01-15 DIAGNOSIS — I10 Essential (primary) hypertension: Secondary | ICD-10-CM | POA: Diagnosis not present

## 2015-01-15 DIAGNOSIS — C549 Malignant neoplasm of corpus uteri, unspecified: Secondary | ICD-10-CM

## 2015-01-15 DIAGNOSIS — R21 Rash and other nonspecific skin eruption: Secondary | ICD-10-CM | POA: Insufficient documentation

## 2015-01-15 DIAGNOSIS — Z923 Personal history of irradiation: Secondary | ICD-10-CM | POA: Insufficient documentation

## 2015-01-15 DIAGNOSIS — Z794 Long term (current) use of insulin: Secondary | ICD-10-CM

## 2015-01-15 DIAGNOSIS — E785 Hyperlipidemia, unspecified: Secondary | ICD-10-CM | POA: Diagnosis not present

## 2015-01-15 DIAGNOSIS — E1165 Type 2 diabetes mellitus with hyperglycemia: Secondary | ICD-10-CM | POA: Diagnosis not present

## 2015-01-15 DIAGNOSIS — M1990 Unspecified osteoarthritis, unspecified site: Secondary | ICD-10-CM | POA: Diagnosis not present

## 2015-01-15 DIAGNOSIS — K2901 Acute gastritis with bleeding: Secondary | ICD-10-CM

## 2015-01-15 DIAGNOSIS — I82409 Acute embolism and thrombosis of unspecified deep veins of unspecified lower extremity: Secondary | ICD-10-CM

## 2015-01-15 DIAGNOSIS — Z90722 Acquired absence of ovaries, bilateral: Secondary | ICD-10-CM | POA: Diagnosis not present

## 2015-01-15 LAB — CBC WITH DIFFERENTIAL/PLATELET
Basophils Absolute: 0 10*3/uL (ref 0–0.1)
Basophils Relative: 0 %
Eosinophils Absolute: 0.2 10*3/uL (ref 0–0.7)
Eosinophils Relative: 1 %
HCT: 34.4 % — ABNORMAL LOW (ref 35.0–47.0)
Hemoglobin: 11.3 g/dL — ABNORMAL LOW (ref 12.0–16.0)
Lymphocytes Relative: 22 %
Lymphs Abs: 2.5 10*3/uL (ref 1.0–3.6)
MCH: 27.4 pg (ref 26.0–34.0)
MCHC: 32.8 g/dL (ref 32.0–36.0)
MCV: 83.4 fL (ref 80.0–100.0)
Monocytes Absolute: 0.7 10*3/uL (ref 0.2–0.9)
Monocytes Relative: 7 %
Neutro Abs: 8 10*3/uL — ABNORMAL HIGH (ref 1.4–6.5)
Neutrophils Relative %: 70 %
Platelets: 241 10*3/uL (ref 150–440)
RBC: 4.13 MIL/uL (ref 3.80–5.20)
RDW: 17.8 % — ABNORMAL HIGH (ref 11.5–14.5)
WBC: 11.4 10*3/uL — ABNORMAL HIGH (ref 3.6–11.0)

## 2015-01-15 LAB — PROTIME-INR
INR: 1.26
Prothrombin Time: 16 seconds — ABNORMAL HIGH (ref 11.4–15.0)

## 2015-01-15 LAB — SAMPLE TO BLOOD BANK

## 2015-01-15 NOTE — Progress Notes (Signed)
Patient here for follow up today for endometrial cancer. States she lives at [facility]. States she is having lower back pain from sitting in w/c. Pillow placed to patients back. Denies any problems or concerns today.

## 2015-01-15 NOTE — Progress Notes (Signed)
[facility] day:  01/15/2015   Chief Complaint: CORYNNE SCIBILIA is a 79 y.o. female with a stage IV endometrial carcinoma and a history of pulmonary embolism on Coumadin who is seen for reassessment.  HPI:  The patient was last seen in medical oncology clinic on 11/30/2014.  At that time, she was seen for 1 week reassessment.  Appetite and weight had increased slightly on Megace. She continued to make very little progress. She denied any abdominal symptoms. She had a significant Candidal perineal rash secondary to urinary leakage.  INR was 1.24.  Her Coumadin was increased slightly to 1 mg 2 days a week (spread out) and 0.5 mg 5 days a week.  Home health was to recheck labs in 1 week.  She was referred to wound care.  She was admitted to [facility] on 12/12/2014 with an upper GI bleed, Klebsiella UTI with sepsis, decubitus ulcer, and general debilitation.  Initial hemoglobin was 3.  Initial INR was 1.37.  She was transfused with 2 units PRBCs with stabilization of hematocrit. She was admitted to the ICU. She was placed on Protonix drip.  EGD revealed mild gastropathy involving the antrum.  She was discharged on 12/17/2014 to [facility].  During the interim, she has felt about the same. She notes that she is in bed most of the day.  She spends her time looking at the window. Her weight is "all right". She has been eating. A Foley catheter remains in place. She has been taking Coumadin 1 mg a day according to the [facility].  She has had no bleeding.  Past Medical History  Diagnosis Date  . Diabetes mellitus without complication   . C. difficile diarrhea   . Uterine cancer   . Hypogammaglobulinemia   . Pulmonary embolism   . Decubitus ulcers   . Hypertension   . Hypercholesterolemia   . Incontinence of urine   . Cancer     Endometrial Cancer  . Cervical cancer     with radiation 45 years ago...  . Arthritis   . GERD (gastroesophageal reflux  disease)   . Bladder cancer   . Decubitus ulcer of buttock, stage 1     Past Surgical History  Procedure Laterality Date  . Total abdominal hysterectomy w/ bilateral salpingoophorectomy  07/29/2010  . Stent in leg  2016  . Cataract extraction, bilateral    . Bladder surgery    . Femoral endarterectomy      right  . Leg surgery      right leg  . Portacath placement      right  . Esophagogastroduodenoscopy N/A 12/14/2014    Procedure: ESOPHAGOGASTRODUODENOSCOPY (EGD);  Surgeon: Hulen Luster, MD;  Location: [name] ENDOSCOPY;  Service: Endoscopy;  Laterality: N/A;    Family History  Problem Relation Age of Onset  . Hypertension Mother   . Diabetes Mellitus II Mother   . Coronary artery disease Mother   . Hypertension Father   . Diabetes Mellitus II Father   . CAD Father     Social History:  reports that she has never smoked. She does not have any smokeless tobacco history on file. She reports that she does not drink alcohol or use illicit drugs.  The patient is accompanied by her sister, [name].  Her daughter, [name] lives in [name].  Her sister can not care for her.  Her daughter's cell number is [phone].  Allergies:  Allergies  Allergen Reactions  .  Contrast Media [Iodinated Diagnostic Agents]     Current Medications: Current Outpatient Prescriptions  Medication Sig Dispense Refill  . amLODipine (NORVASC) 5 MG tablet TK 1 T PO QD  0  . atenolol (TENORMIN) 50 MG tablet Take 50 mg by mouth daily.    . benazepril (LOTENSIN) 40 MG tablet Take 40 mg by mouth daily.    . cloNIDine (CATAPRES) 0.2 MG tablet Take 0.2 mg by mouth 2 (two) times daily.    . furosemide (LASIX) 20 MG tablet Take 20 mg by mouth every other day.    Marland Kitchen HUMULIN 70/30 (70-30) 100 UNIT/ML injection 16 Units daily at 6 PM.    . HUMULIN 70/30 KWIKPEN (70-30) 100 UNIT/ML PEN     . hydrALAZINE (APRESOLINE) 100 MG tablet Take 100 mg by mouth 2 (two) times daily.    . meclizine (ANTIVERT) 25 MG tablet Take 25  mg by mouth daily.    . megestrol (MEGACE ES) 625 MG/5ML suspension Take 1.6 mLs (200 mg total) by mouth daily. to stimulate appetite 150 mL 0  . mirtazapine (REMERON) 15 MG tablet Take 15 mg by mouth daily.    . mupirocin ointment (BACTROBAN) 2 %     . NOVOLOG FLEXPEN 100 UNIT/ML FlexPen Per Sliding Scale...    . nystatin (MYCOSTATIN) powder     . omeprazole (PRILOSEC) 20 MG capsule Take 20 mg by mouth daily.  12  . potassium chloride (K-DUR) 10 MEQ tablet Take 1 tablet by mouth daily.  0  . SANTYL ointment     . tamsulosin (FLOMAX) 0.4 MG CAPS capsule Take 0.4 mg by mouth 2 (two) times daily.    Marland Kitchen warfarin (COUMADIN) 1 MG tablet Take 1 mg by mouth daily.    Marland Kitchen atenolol (TENORMIN) 100 MG tablet Take 1 tablet by mouth daily.  3  . cephALEXin (KEFLEX) 500 MG capsule Take 1 capsule (500 mg total) by mouth 4 (four) times daily. (Patient not taking: Reported on 01/15/2015) 28 capsule 0  . lovastatin (MEVACOR) 20 MG tablet Take 20 mg by mouth daily.     No current facility-administered medications for this visit.   Facility-Administered Medications Ordered in Other Visits  Medication Dose Route Frequency Provider Last Rate Last Dose  . sodium chloride 0.9 % injection 10 mL  10 mL Intravenous PRN Evlyn Kanner, NP   10 mL at 11/02/14 0254    Review of Systems:  GENERAL:  General fatigue and weakness.  Feels "alright".  Minimal activity.  No fevers, sweats.  Weight stable. PERFORMANCE STATUS (ECOG):  3-4 HEENT:  No visual changes, runny nose, sore throat, mouth sores or tenderness. Lungs: No shortness of breath or cough.  No hemoptysis. Cardiac:  No chest pain, palpitations, orthopnea, or PND. GI:  Eating.  No nausea, vomiting, diarrhea, constipation, melena or hematochezia. GU:  Foley catheter in place.  No urgency, frequency, dysuria, or hematuria. Musculoskeletal:  No back pain.  No joint pain.  No muscle tenderness. Extremities:  No pain or swelling. Skin:  Pressure sores back and heel.   Receiving wound care.  Neuro:  General weakness.  No headache, numbness or weakness, balance or coordination issues. Endocrine:  Diabetes.  No thyroid issues, hot flashes or night sweats. Psych:  No mood changes, depression or anxiety. Pain:  No focal pain. Review of systems:  All other systems reviewed and found to be negative.  Physical Exam: Blood pressure 155/79, pulse 4, temperature 98.2 F (36.8 C), temperature source Tympanic, resp. rate  18. GENERAL:  Chronically ill appearing woman sitting comfortably in a wheelchair under a blanket in no acute distress. MENTAL STATUS:  Alert and oriented to person, place and time. HEAD: Pearline Cables thin hair.  Normocephalic, atraumatic, face symmetric, no Cushingoid features.   EYES:  No conjunctivitis or scleral icterus. ENT:  Oropharynx clear without lesion.  No thrush.  RESPIRATORY:  Clear to auscultation without rales, wheezes or rhonchi. CARDIOVASCULAR:  Regular rate and rhythm without murmur, rub or gallop. ABDOMEN:  Soft, non-tender, with active bowel sounds, and no hepatosplenomegaly.  No masses. SKIN:  No rash.  Deep sacral decubitus ulcer.  Left buttock ecchymosis. EXTREMITIES: No edema, no skin discoloration or tenderness.  No palpable cords. NEUROLOGICAL: Unremarkable. PSYCH:  Appropriate.  Appointment on 01/15/2015  Component Date Value Ref Range Status  . Prothrombin Time 01/15/2015 16.0* 11.4 - 15.0 seconds Final  . INR 01/15/2015 1.26   Final  . Blood Bank Specimen 01/15/2015 SAMPLE AVAILABLE FOR TESTING   Final  . Sample Expiration 01/15/2015 01/18/2015   Final  . WBC 01/15/2015 11.4* 3.6 - 11.0 K/uL Final  . RBC 01/15/2015 4.13  3.80 - 5.20 MIL/uL Final  . Hemoglobin 01/15/2015 11.3* 12.0 - 16.0 g/dL Final  . HCT 01/15/2015 34.4* 35.0 - 47.0 % Final  . MCV 01/15/2015 83.4  80.0 - 100.0 fL Final  . MCH 01/15/2015 27.4  26.0 - 34.0 pg Final  . MCHC 01/15/2015 32.8  32.0 - 36.0 g/dL Final  . RDW 01/15/2015 17.8* 11.5 - 14.5 %  Final  . Platelets 01/15/2015 241  150 - 440 K/uL Final  . Neutrophils Relative % 01/15/2015 70   Final  . Neutro Abs 01/15/2015 8.0* 1.4 - 6.5 K/uL Final  . Lymphocytes Relative 01/15/2015 22   Final  . Lymphs Abs 01/15/2015 2.5  1.0 - 3.6 K/uL Final  . Monocytes Relative 01/15/2015 7   Final  . Monocytes Absolute 01/15/2015 0.7  0.2 - 0.9 K/uL Final  . Eosinophils Relative 01/15/2015 1   Final  . Eosinophils Absolute 01/15/2015 0.2  0 - 0.7 K/uL Final  . Basophils Relative 01/15/2015 0   Final  . Basophils Absolute 01/15/2015 0.0  0 - 0.1 K/uL Final    Assessment:  LAHELA WOODIN is a 79 y.o. female with recurrent stage IV endoemtrial carcinoma.  She underwent TAH/BSO on 07/29/2010.  She was initially felt to have have IIIC disease, but later stage IV disease secondary to likely liver metastasis.  Tumor was ER negative.  She received 11 cycles of carboplatin and Taxol (10/07/2010 until 06/29/2011). She required dose modifications secondary to cytopenias.  She has been followed with serial CA 125s. There has been a steady increase in her CA 125 (1936 on 06/20/2014 and 4186 on 11/21/2014). She has not been treated by Dr. Inez Pilgrim secondary being asymptomatic. Notes indicate imaging studies revealed slight nodal progression.  Her performance status limits the ability to treat her endometrial cancer.  Abdominal and pelvic CT scan on 11/21/2014 revealed progressive nodal metastatic disease throughout the retroperitoneum. A precaval node measured 4.2 x 3.6 cm (previously 3.8 x 3.5 cm) the left periaortic node measured 3.6 x 2.6 cm (previously 3 x 2.4 cm) There was no evidence of extranodal metastasis in the abdomen or pelvis although imaging was limited by nocontrast. There was a persistent concern of a lingular nodule.   Her course has been complicated by a pulmonary embolism in 08/2010. She is on chronic Coumadin (0.5 mg a day). She has a  history of recurrent C. difficile infection and  hypogammaglobulinemia.  She has a recent history of UTI sepsis, sacral decubitus, and acute renal insufficiency.   She was admitted to [facility] from 12/12/2014 - 12/17/2014 with an upper GI bleed, Klebsiella UTI with sepsis, decubitus ulcer, and general debilitation.  Initial hemoglobin was 3.  Initial INR was 1.37.  She was transfused with packed red blood cells.  EGD revealed mild gastropathy involving the antrum.    She is currently living at [facility].  Performance status is poor.  She has a decubitus ulcer and is receiving wound care.  She continues to make very little progress.  She denies any abdominal symptoms.  INR is 1.26 (sub-therapeutic) on Coumadin 1 mg 7 days/week.  Plan: 1.  Labs today:  CBC with diff, PT/INR. 2.  Increase Coumadin to 1.5 mg 2 days a week (spread out) and 1 mg 5 days a week. 3.  PT/INR in 1 week.  Goal INR 2.0. 4.  Close follow-up of INR given recent history of GI bleed. 5.  Discuss no plan for chemotherapy. 6.  Complete paperwork for [facility]. 7.  RTC in 1 month for MD assessment and labs (CBC with diff, PT/INR).   Lequita Asal, MD  01/15/2015, 5:14 PM

## 2015-01-16 ENCOUNTER — Ambulatory Visit: Payer: Medicare Other

## 2015-01-16 DIAGNOSIS — C55 Malignant neoplasm of uterus, part unspecified: Secondary | ICD-10-CM | POA: Diagnosis not present

## 2015-01-16 DIAGNOSIS — Z8551 Personal history of malignant neoplasm of bladder: Secondary | ICD-10-CM | POA: Diagnosis not present

## 2015-01-16 DIAGNOSIS — I10 Essential (primary) hypertension: Secondary | ICD-10-CM | POA: Diagnosis not present

## 2015-01-16 DIAGNOSIS — Z8541 Personal history of malignant neoplasm of cervix uteri: Secondary | ICD-10-CM | POA: Diagnosis not present

## 2015-01-16 DIAGNOSIS — E785 Hyperlipidemia, unspecified: Secondary | ICD-10-CM | POA: Diagnosis not present

## 2015-01-16 DIAGNOSIS — E1165 Type 2 diabetes mellitus with hyperglycemia: Secondary | ICD-10-CM | POA: Diagnosis not present

## 2015-01-16 DIAGNOSIS — K219 Gastro-esophageal reflux disease without esophagitis: Secondary | ICD-10-CM | POA: Diagnosis not present

## 2015-01-16 DIAGNOSIS — M1990 Unspecified osteoarthritis, unspecified site: Secondary | ICD-10-CM | POA: Diagnosis not present

## 2015-01-17 DIAGNOSIS — C55 Malignant neoplasm of uterus, part unspecified: Secondary | ICD-10-CM | POA: Diagnosis not present

## 2015-01-17 DIAGNOSIS — K219 Gastro-esophageal reflux disease without esophagitis: Secondary | ICD-10-CM | POA: Diagnosis not present

## 2015-01-17 DIAGNOSIS — E1165 Type 2 diabetes mellitus with hyperglycemia: Secondary | ICD-10-CM | POA: Diagnosis not present

## 2015-01-17 DIAGNOSIS — Z8541 Personal history of malignant neoplasm of cervix uteri: Secondary | ICD-10-CM | POA: Diagnosis not present

## 2015-01-17 DIAGNOSIS — Z8551 Personal history of malignant neoplasm of bladder: Secondary | ICD-10-CM | POA: Diagnosis not present

## 2015-01-17 DIAGNOSIS — E785 Hyperlipidemia, unspecified: Secondary | ICD-10-CM | POA: Diagnosis not present

## 2015-01-17 DIAGNOSIS — I10 Essential (primary) hypertension: Secondary | ICD-10-CM | POA: Diagnosis not present

## 2015-01-17 DIAGNOSIS — M1990 Unspecified osteoarthritis, unspecified site: Secondary | ICD-10-CM | POA: Diagnosis not present

## 2015-01-18 DIAGNOSIS — Z8541 Personal history of malignant neoplasm of cervix uteri: Secondary | ICD-10-CM | POA: Diagnosis not present

## 2015-01-18 DIAGNOSIS — E1165 Type 2 diabetes mellitus with hyperglycemia: Secondary | ICD-10-CM | POA: Diagnosis not present

## 2015-01-18 DIAGNOSIS — B9689 Other specified bacterial agents as the cause of diseases classified elsewhere: Secondary | ICD-10-CM | POA: Diagnosis not present

## 2015-01-18 DIAGNOSIS — N390 Urinary tract infection, site not specified: Secondary | ICD-10-CM | POA: Diagnosis not present

## 2015-01-18 DIAGNOSIS — I10 Essential (primary) hypertension: Secondary | ICD-10-CM | POA: Diagnosis not present

## 2015-01-18 DIAGNOSIS — K219 Gastro-esophageal reflux disease without esophagitis: Secondary | ICD-10-CM | POA: Diagnosis not present

## 2015-01-18 DIAGNOSIS — M1990 Unspecified osteoarthritis, unspecified site: Secondary | ICD-10-CM | POA: Diagnosis not present

## 2015-01-18 DIAGNOSIS — Z8551 Personal history of malignant neoplasm of bladder: Secondary | ICD-10-CM | POA: Diagnosis not present

## 2015-01-18 DIAGNOSIS — C55 Malignant neoplasm of uterus, part unspecified: Secondary | ICD-10-CM | POA: Diagnosis not present

## 2015-01-18 DIAGNOSIS — E785 Hyperlipidemia, unspecified: Secondary | ICD-10-CM | POA: Diagnosis not present

## 2015-01-19 DIAGNOSIS — Z8541 Personal history of malignant neoplasm of cervix uteri: Secondary | ICD-10-CM | POA: Diagnosis not present

## 2015-01-19 DIAGNOSIS — I10 Essential (primary) hypertension: Secondary | ICD-10-CM | POA: Diagnosis not present

## 2015-01-19 DIAGNOSIS — E785 Hyperlipidemia, unspecified: Secondary | ICD-10-CM | POA: Diagnosis not present

## 2015-01-19 DIAGNOSIS — E1165 Type 2 diabetes mellitus with hyperglycemia: Secondary | ICD-10-CM | POA: Diagnosis not present

## 2015-01-19 DIAGNOSIS — M1990 Unspecified osteoarthritis, unspecified site: Secondary | ICD-10-CM | POA: Diagnosis not present

## 2015-01-19 DIAGNOSIS — K219 Gastro-esophageal reflux disease without esophagitis: Secondary | ICD-10-CM | POA: Diagnosis not present

## 2015-01-19 DIAGNOSIS — C55 Malignant neoplasm of uterus, part unspecified: Secondary | ICD-10-CM | POA: Diagnosis not present

## 2015-01-19 DIAGNOSIS — Z8551 Personal history of malignant neoplasm of bladder: Secondary | ICD-10-CM | POA: Diagnosis not present

## 2015-01-20 DIAGNOSIS — Z8551 Personal history of malignant neoplasm of bladder: Secondary | ICD-10-CM | POA: Diagnosis not present

## 2015-01-20 DIAGNOSIS — I10 Essential (primary) hypertension: Secondary | ICD-10-CM | POA: Diagnosis not present

## 2015-01-20 DIAGNOSIS — C55 Malignant neoplasm of uterus, part unspecified: Secondary | ICD-10-CM | POA: Diagnosis not present

## 2015-01-20 DIAGNOSIS — E1165 Type 2 diabetes mellitus with hyperglycemia: Secondary | ICD-10-CM | POA: Diagnosis not present

## 2015-01-20 DIAGNOSIS — K219 Gastro-esophageal reflux disease without esophagitis: Secondary | ICD-10-CM | POA: Diagnosis not present

## 2015-01-20 DIAGNOSIS — Z8541 Personal history of malignant neoplasm of cervix uteri: Secondary | ICD-10-CM | POA: Diagnosis not present

## 2015-01-20 DIAGNOSIS — E785 Hyperlipidemia, unspecified: Secondary | ICD-10-CM | POA: Diagnosis not present

## 2015-01-20 DIAGNOSIS — M1990 Unspecified osteoarthritis, unspecified site: Secondary | ICD-10-CM | POA: Diagnosis not present

## 2015-01-21 DIAGNOSIS — E1165 Type 2 diabetes mellitus with hyperglycemia: Secondary | ICD-10-CM | POA: Diagnosis not present

## 2015-01-21 DIAGNOSIS — Z8551 Personal history of malignant neoplasm of bladder: Secondary | ICD-10-CM | POA: Diagnosis not present

## 2015-01-21 DIAGNOSIS — E785 Hyperlipidemia, unspecified: Secondary | ICD-10-CM | POA: Diagnosis not present

## 2015-01-21 DIAGNOSIS — K219 Gastro-esophageal reflux disease without esophagitis: Secondary | ICD-10-CM | POA: Diagnosis not present

## 2015-01-21 DIAGNOSIS — M1990 Unspecified osteoarthritis, unspecified site: Secondary | ICD-10-CM | POA: Diagnosis not present

## 2015-01-21 DIAGNOSIS — C55 Malignant neoplasm of uterus, part unspecified: Secondary | ICD-10-CM | POA: Diagnosis not present

## 2015-01-21 DIAGNOSIS — I10 Essential (primary) hypertension: Secondary | ICD-10-CM | POA: Diagnosis not present

## 2015-01-21 DIAGNOSIS — Z5181 Encounter for therapeutic drug level monitoring: Secondary | ICD-10-CM | POA: Diagnosis not present

## 2015-01-21 DIAGNOSIS — Z8541 Personal history of malignant neoplasm of cervix uteri: Secondary | ICD-10-CM | POA: Diagnosis not present

## 2015-01-22 DIAGNOSIS — Z8551 Personal history of malignant neoplasm of bladder: Secondary | ICD-10-CM | POA: Diagnosis not present

## 2015-01-22 DIAGNOSIS — M1990 Unspecified osteoarthritis, unspecified site: Secondary | ICD-10-CM | POA: Diagnosis not present

## 2015-01-22 DIAGNOSIS — E1165 Type 2 diabetes mellitus with hyperglycemia: Secondary | ICD-10-CM | POA: Diagnosis not present

## 2015-01-22 DIAGNOSIS — I10 Essential (primary) hypertension: Secondary | ICD-10-CM | POA: Diagnosis not present

## 2015-01-22 DIAGNOSIS — E785 Hyperlipidemia, unspecified: Secondary | ICD-10-CM | POA: Diagnosis not present

## 2015-01-22 DIAGNOSIS — C55 Malignant neoplasm of uterus, part unspecified: Secondary | ICD-10-CM | POA: Diagnosis not present

## 2015-01-22 DIAGNOSIS — K219 Gastro-esophageal reflux disease without esophagitis: Secondary | ICD-10-CM | POA: Diagnosis not present

## 2015-01-22 DIAGNOSIS — Z8541 Personal history of malignant neoplasm of cervix uteri: Secondary | ICD-10-CM | POA: Diagnosis not present

## 2015-01-23 DIAGNOSIS — C55 Malignant neoplasm of uterus, part unspecified: Secondary | ICD-10-CM | POA: Diagnosis not present

## 2015-01-23 DIAGNOSIS — I10 Essential (primary) hypertension: Secondary | ICD-10-CM | POA: Diagnosis not present

## 2015-01-23 DIAGNOSIS — Z8541 Personal history of malignant neoplasm of cervix uteri: Secondary | ICD-10-CM | POA: Diagnosis not present

## 2015-01-23 DIAGNOSIS — E1165 Type 2 diabetes mellitus with hyperglycemia: Secondary | ICD-10-CM | POA: Diagnosis not present

## 2015-01-23 DIAGNOSIS — Z8551 Personal history of malignant neoplasm of bladder: Secondary | ICD-10-CM | POA: Diagnosis not present

## 2015-01-23 DIAGNOSIS — E785 Hyperlipidemia, unspecified: Secondary | ICD-10-CM | POA: Diagnosis not present

## 2015-01-23 DIAGNOSIS — K219 Gastro-esophageal reflux disease without esophagitis: Secondary | ICD-10-CM | POA: Diagnosis not present

## 2015-01-23 DIAGNOSIS — M1990 Unspecified osteoarthritis, unspecified site: Secondary | ICD-10-CM | POA: Diagnosis not present

## 2015-01-24 DIAGNOSIS — M1990 Unspecified osteoarthritis, unspecified site: Secondary | ICD-10-CM | POA: Diagnosis not present

## 2015-01-24 DIAGNOSIS — Z8541 Personal history of malignant neoplasm of cervix uteri: Secondary | ICD-10-CM | POA: Diagnosis not present

## 2015-01-24 DIAGNOSIS — I10 Essential (primary) hypertension: Secondary | ICD-10-CM | POA: Diagnosis not present

## 2015-01-24 DIAGNOSIS — E785 Hyperlipidemia, unspecified: Secondary | ICD-10-CM | POA: Diagnosis not present

## 2015-01-24 DIAGNOSIS — Z8551 Personal history of malignant neoplasm of bladder: Secondary | ICD-10-CM | POA: Diagnosis not present

## 2015-01-24 DIAGNOSIS — E1165 Type 2 diabetes mellitus with hyperglycemia: Secondary | ICD-10-CM | POA: Diagnosis not present

## 2015-01-24 DIAGNOSIS — K219 Gastro-esophageal reflux disease without esophagitis: Secondary | ICD-10-CM | POA: Diagnosis not present

## 2015-01-24 DIAGNOSIS — C55 Malignant neoplasm of uterus, part unspecified: Secondary | ICD-10-CM | POA: Diagnosis not present

## 2015-01-25 DIAGNOSIS — E785 Hyperlipidemia, unspecified: Secondary | ICD-10-CM | POA: Diagnosis not present

## 2015-01-25 DIAGNOSIS — Z8551 Personal history of malignant neoplasm of bladder: Secondary | ICD-10-CM | POA: Diagnosis not present

## 2015-01-25 DIAGNOSIS — I5032 Chronic diastolic (congestive) heart failure: Secondary | ICD-10-CM | POA: Diagnosis not present

## 2015-01-25 DIAGNOSIS — I10 Essential (primary) hypertension: Secondary | ICD-10-CM | POA: Diagnosis not present

## 2015-01-25 DIAGNOSIS — L89150 Pressure ulcer of sacral region, unstageable: Secondary | ICD-10-CM | POA: Diagnosis not present

## 2015-01-25 DIAGNOSIS — K219 Gastro-esophageal reflux disease without esophagitis: Secondary | ICD-10-CM | POA: Diagnosis not present

## 2015-01-25 DIAGNOSIS — E1165 Type 2 diabetes mellitus with hyperglycemia: Secondary | ICD-10-CM | POA: Diagnosis not present

## 2015-01-25 DIAGNOSIS — M1990 Unspecified osteoarthritis, unspecified site: Secondary | ICD-10-CM | POA: Diagnosis not present

## 2015-01-25 DIAGNOSIS — Z8541 Personal history of malignant neoplasm of cervix uteri: Secondary | ICD-10-CM | POA: Diagnosis not present

## 2015-01-25 DIAGNOSIS — I4891 Unspecified atrial fibrillation: Secondary | ICD-10-CM | POA: Diagnosis not present

## 2015-01-25 DIAGNOSIS — C55 Malignant neoplasm of uterus, part unspecified: Secondary | ICD-10-CM | POA: Diagnosis not present

## 2015-01-25 DIAGNOSIS — I119 Hypertensive heart disease without heart failure: Secondary | ICD-10-CM | POA: Diagnosis not present

## 2015-01-26 DIAGNOSIS — Z8541 Personal history of malignant neoplasm of cervix uteri: Secondary | ICD-10-CM | POA: Diagnosis not present

## 2015-01-26 DIAGNOSIS — Z8551 Personal history of malignant neoplasm of bladder: Secondary | ICD-10-CM | POA: Diagnosis not present

## 2015-01-26 DIAGNOSIS — C55 Malignant neoplasm of uterus, part unspecified: Secondary | ICD-10-CM | POA: Diagnosis not present

## 2015-01-26 DIAGNOSIS — K219 Gastro-esophageal reflux disease without esophagitis: Secondary | ICD-10-CM | POA: Diagnosis not present

## 2015-01-26 DIAGNOSIS — E1165 Type 2 diabetes mellitus with hyperglycemia: Secondary | ICD-10-CM | POA: Diagnosis not present

## 2015-01-26 DIAGNOSIS — M1990 Unspecified osteoarthritis, unspecified site: Secondary | ICD-10-CM | POA: Diagnosis not present

## 2015-01-26 DIAGNOSIS — I10 Essential (primary) hypertension: Secondary | ICD-10-CM | POA: Diagnosis not present

## 2015-01-26 DIAGNOSIS — E785 Hyperlipidemia, unspecified: Secondary | ICD-10-CM | POA: Diagnosis not present

## 2015-01-27 DIAGNOSIS — Z8551 Personal history of malignant neoplasm of bladder: Secondary | ICD-10-CM | POA: Diagnosis not present

## 2015-01-27 DIAGNOSIS — E1165 Type 2 diabetes mellitus with hyperglycemia: Secondary | ICD-10-CM | POA: Diagnosis not present

## 2015-01-27 DIAGNOSIS — M1990 Unspecified osteoarthritis, unspecified site: Secondary | ICD-10-CM | POA: Diagnosis not present

## 2015-01-27 DIAGNOSIS — I10 Essential (primary) hypertension: Secondary | ICD-10-CM | POA: Diagnosis not present

## 2015-01-27 DIAGNOSIS — E785 Hyperlipidemia, unspecified: Secondary | ICD-10-CM | POA: Diagnosis not present

## 2015-01-27 DIAGNOSIS — C55 Malignant neoplasm of uterus, part unspecified: Secondary | ICD-10-CM | POA: Diagnosis not present

## 2015-01-27 DIAGNOSIS — Z8541 Personal history of malignant neoplasm of cervix uteri: Secondary | ICD-10-CM | POA: Diagnosis not present

## 2015-01-27 DIAGNOSIS — K219 Gastro-esophageal reflux disease without esophagitis: Secondary | ICD-10-CM | POA: Diagnosis not present

## 2015-01-28 ENCOUNTER — Other Ambulatory Visit: Payer: Self-pay | Admitting: Family Medicine

## 2015-01-28 DIAGNOSIS — K219 Gastro-esophageal reflux disease without esophagitis: Secondary | ICD-10-CM | POA: Diagnosis not present

## 2015-01-28 DIAGNOSIS — Z8551 Personal history of malignant neoplasm of bladder: Secondary | ICD-10-CM | POA: Diagnosis not present

## 2015-01-28 DIAGNOSIS — C55 Malignant neoplasm of uterus, part unspecified: Secondary | ICD-10-CM | POA: Diagnosis not present

## 2015-01-28 DIAGNOSIS — Z8541 Personal history of malignant neoplasm of cervix uteri: Secondary | ICD-10-CM | POA: Diagnosis not present

## 2015-01-28 DIAGNOSIS — E785 Hyperlipidemia, unspecified: Secondary | ICD-10-CM | POA: Diagnosis not present

## 2015-01-28 DIAGNOSIS — I10 Essential (primary) hypertension: Secondary | ICD-10-CM | POA: Diagnosis not present

## 2015-01-28 DIAGNOSIS — E1165 Type 2 diabetes mellitus with hyperglycemia: Secondary | ICD-10-CM | POA: Diagnosis not present

## 2015-01-28 DIAGNOSIS — M1990 Unspecified osteoarthritis, unspecified site: Secondary | ICD-10-CM | POA: Diagnosis not present

## 2015-01-29 DIAGNOSIS — Z8541 Personal history of malignant neoplasm of cervix uteri: Secondary | ICD-10-CM | POA: Diagnosis not present

## 2015-01-29 DIAGNOSIS — M1990 Unspecified osteoarthritis, unspecified site: Secondary | ICD-10-CM | POA: Diagnosis not present

## 2015-01-29 DIAGNOSIS — E1165 Type 2 diabetes mellitus with hyperglycemia: Secondary | ICD-10-CM | POA: Diagnosis not present

## 2015-01-29 DIAGNOSIS — Z8551 Personal history of malignant neoplasm of bladder: Secondary | ICD-10-CM | POA: Diagnosis not present

## 2015-01-29 DIAGNOSIS — E785 Hyperlipidemia, unspecified: Secondary | ICD-10-CM | POA: Diagnosis not present

## 2015-01-29 DIAGNOSIS — C55 Malignant neoplasm of uterus, part unspecified: Secondary | ICD-10-CM | POA: Diagnosis not present

## 2015-01-29 DIAGNOSIS — K219 Gastro-esophageal reflux disease without esophagitis: Secondary | ICD-10-CM | POA: Diagnosis not present

## 2015-01-29 DIAGNOSIS — I10 Essential (primary) hypertension: Secondary | ICD-10-CM | POA: Diagnosis not present

## 2015-01-30 ENCOUNTER — Inpatient Hospital Stay

## 2015-01-30 DIAGNOSIS — E1165 Type 2 diabetes mellitus with hyperglycemia: Secondary | ICD-10-CM | POA: Diagnosis not present

## 2015-01-30 DIAGNOSIS — I10 Essential (primary) hypertension: Secondary | ICD-10-CM | POA: Diagnosis not present

## 2015-01-30 DIAGNOSIS — K219 Gastro-esophageal reflux disease without esophagitis: Secondary | ICD-10-CM | POA: Diagnosis not present

## 2015-01-30 DIAGNOSIS — Z8551 Personal history of malignant neoplasm of bladder: Secondary | ICD-10-CM | POA: Diagnosis not present

## 2015-01-30 DIAGNOSIS — E785 Hyperlipidemia, unspecified: Secondary | ICD-10-CM | POA: Diagnosis not present

## 2015-01-30 DIAGNOSIS — C55 Malignant neoplasm of uterus, part unspecified: Secondary | ICD-10-CM | POA: Diagnosis not present

## 2015-01-30 DIAGNOSIS — Z8541 Personal history of malignant neoplasm of cervix uteri: Secondary | ICD-10-CM | POA: Diagnosis not present

## 2015-01-30 DIAGNOSIS — M1990 Unspecified osteoarthritis, unspecified site: Secondary | ICD-10-CM | POA: Diagnosis not present

## 2015-01-31 ENCOUNTER — Encounter: Payer: Self-pay | Admitting: *Deleted

## 2015-01-31 DIAGNOSIS — E1165 Type 2 diabetes mellitus with hyperglycemia: Secondary | ICD-10-CM | POA: Diagnosis not present

## 2015-01-31 DIAGNOSIS — I10 Essential (primary) hypertension: Secondary | ICD-10-CM | POA: Diagnosis not present

## 2015-01-31 DIAGNOSIS — Z8551 Personal history of malignant neoplasm of bladder: Secondary | ICD-10-CM | POA: Diagnosis not present

## 2015-01-31 DIAGNOSIS — M1990 Unspecified osteoarthritis, unspecified site: Secondary | ICD-10-CM | POA: Diagnosis not present

## 2015-01-31 DIAGNOSIS — K219 Gastro-esophageal reflux disease without esophagitis: Secondary | ICD-10-CM | POA: Diagnosis not present

## 2015-01-31 DIAGNOSIS — Z8541 Personal history of malignant neoplasm of cervix uteri: Secondary | ICD-10-CM | POA: Diagnosis not present

## 2015-01-31 DIAGNOSIS — E785 Hyperlipidemia, unspecified: Secondary | ICD-10-CM | POA: Diagnosis not present

## 2015-01-31 DIAGNOSIS — C55 Malignant neoplasm of uterus, part unspecified: Secondary | ICD-10-CM | POA: Diagnosis not present

## 2015-01-31 NOTE — Progress Notes (Signed)
No show on 01/30/15 with Dr. Theora Gianotti. Order sent to scheduling to r/s this appointment for next available apt with gyn oncology

## 2015-02-01 DIAGNOSIS — E1165 Type 2 diabetes mellitus with hyperglycemia: Secondary | ICD-10-CM | POA: Diagnosis not present

## 2015-02-01 DIAGNOSIS — M1990 Unspecified osteoarthritis, unspecified site: Secondary | ICD-10-CM | POA: Diagnosis not present

## 2015-02-01 DIAGNOSIS — E785 Hyperlipidemia, unspecified: Secondary | ICD-10-CM | POA: Diagnosis not present

## 2015-02-01 DIAGNOSIS — K219 Gastro-esophageal reflux disease without esophagitis: Secondary | ICD-10-CM | POA: Diagnosis not present

## 2015-02-01 DIAGNOSIS — C55 Malignant neoplasm of uterus, part unspecified: Secondary | ICD-10-CM | POA: Diagnosis not present

## 2015-02-01 DIAGNOSIS — Z8541 Personal history of malignant neoplasm of cervix uteri: Secondary | ICD-10-CM | POA: Diagnosis not present

## 2015-02-01 DIAGNOSIS — Z8551 Personal history of malignant neoplasm of bladder: Secondary | ICD-10-CM | POA: Diagnosis not present

## 2015-02-01 DIAGNOSIS — I10 Essential (primary) hypertension: Secondary | ICD-10-CM | POA: Diagnosis not present

## 2015-02-02 DIAGNOSIS — C55 Malignant neoplasm of uterus, part unspecified: Secondary | ICD-10-CM | POA: Diagnosis not present

## 2015-02-02 DIAGNOSIS — E1165 Type 2 diabetes mellitus with hyperglycemia: Secondary | ICD-10-CM | POA: Diagnosis not present

## 2015-02-02 DIAGNOSIS — Z8551 Personal history of malignant neoplasm of bladder: Secondary | ICD-10-CM | POA: Diagnosis not present

## 2015-02-02 DIAGNOSIS — K219 Gastro-esophageal reflux disease without esophagitis: Secondary | ICD-10-CM | POA: Diagnosis not present

## 2015-02-02 DIAGNOSIS — M1990 Unspecified osteoarthritis, unspecified site: Secondary | ICD-10-CM | POA: Diagnosis not present

## 2015-02-02 DIAGNOSIS — I10 Essential (primary) hypertension: Secondary | ICD-10-CM | POA: Diagnosis not present

## 2015-02-02 DIAGNOSIS — E785 Hyperlipidemia, unspecified: Secondary | ICD-10-CM | POA: Diagnosis not present

## 2015-02-02 DIAGNOSIS — Z8541 Personal history of malignant neoplasm of cervix uteri: Secondary | ICD-10-CM | POA: Diagnosis not present

## 2015-02-03 DIAGNOSIS — C55 Malignant neoplasm of uterus, part unspecified: Secondary | ICD-10-CM | POA: Diagnosis not present

## 2015-02-03 DIAGNOSIS — E1165 Type 2 diabetes mellitus with hyperglycemia: Secondary | ICD-10-CM | POA: Diagnosis not present

## 2015-02-03 DIAGNOSIS — Z8551 Personal history of malignant neoplasm of bladder: Secondary | ICD-10-CM | POA: Diagnosis not present

## 2015-02-03 DIAGNOSIS — M1990 Unspecified osteoarthritis, unspecified site: Secondary | ICD-10-CM | POA: Diagnosis not present

## 2015-02-03 DIAGNOSIS — I10 Essential (primary) hypertension: Secondary | ICD-10-CM | POA: Diagnosis not present

## 2015-02-03 DIAGNOSIS — E785 Hyperlipidemia, unspecified: Secondary | ICD-10-CM | POA: Diagnosis not present

## 2015-02-03 DIAGNOSIS — Z8541 Personal history of malignant neoplasm of cervix uteri: Secondary | ICD-10-CM | POA: Diagnosis not present

## 2015-02-03 DIAGNOSIS — K219 Gastro-esophageal reflux disease without esophagitis: Secondary | ICD-10-CM | POA: Diagnosis not present

## 2015-02-04 DIAGNOSIS — K219 Gastro-esophageal reflux disease without esophagitis: Secondary | ICD-10-CM | POA: Diagnosis not present

## 2015-02-04 DIAGNOSIS — M1990 Unspecified osteoarthritis, unspecified site: Secondary | ICD-10-CM | POA: Diagnosis not present

## 2015-02-04 DIAGNOSIS — E1165 Type 2 diabetes mellitus with hyperglycemia: Secondary | ICD-10-CM | POA: Diagnosis not present

## 2015-02-04 DIAGNOSIS — I10 Essential (primary) hypertension: Secondary | ICD-10-CM | POA: Diagnosis not present

## 2015-02-04 DIAGNOSIS — C55 Malignant neoplasm of uterus, part unspecified: Secondary | ICD-10-CM | POA: Diagnosis not present

## 2015-02-04 DIAGNOSIS — Z8541 Personal history of malignant neoplasm of cervix uteri: Secondary | ICD-10-CM | POA: Diagnosis not present

## 2015-02-04 DIAGNOSIS — Z8551 Personal history of malignant neoplasm of bladder: Secondary | ICD-10-CM | POA: Diagnosis not present

## 2015-02-04 DIAGNOSIS — E785 Hyperlipidemia, unspecified: Secondary | ICD-10-CM | POA: Diagnosis not present

## 2015-02-05 DIAGNOSIS — Z8551 Personal history of malignant neoplasm of bladder: Secondary | ICD-10-CM | POA: Diagnosis not present

## 2015-02-05 DIAGNOSIS — Z8541 Personal history of malignant neoplasm of cervix uteri: Secondary | ICD-10-CM | POA: Diagnosis not present

## 2015-02-05 DIAGNOSIS — M1990 Unspecified osteoarthritis, unspecified site: Secondary | ICD-10-CM | POA: Diagnosis not present

## 2015-02-05 DIAGNOSIS — C55 Malignant neoplasm of uterus, part unspecified: Secondary | ICD-10-CM | POA: Diagnosis not present

## 2015-02-05 DIAGNOSIS — E785 Hyperlipidemia, unspecified: Secondary | ICD-10-CM | POA: Diagnosis not present

## 2015-02-05 DIAGNOSIS — I10 Essential (primary) hypertension: Secondary | ICD-10-CM | POA: Diagnosis not present

## 2015-02-05 DIAGNOSIS — K219 Gastro-esophageal reflux disease without esophagitis: Secondary | ICD-10-CM | POA: Diagnosis not present

## 2015-02-05 DIAGNOSIS — E1165 Type 2 diabetes mellitus with hyperglycemia: Secondary | ICD-10-CM | POA: Diagnosis not present

## 2015-02-06 DIAGNOSIS — K219 Gastro-esophageal reflux disease without esophagitis: Secondary | ICD-10-CM | POA: Diagnosis not present

## 2015-02-06 DIAGNOSIS — I10 Essential (primary) hypertension: Secondary | ICD-10-CM | POA: Diagnosis not present

## 2015-02-06 DIAGNOSIS — E1165 Type 2 diabetes mellitus with hyperglycemia: Secondary | ICD-10-CM | POA: Diagnosis not present

## 2015-02-06 DIAGNOSIS — C55 Malignant neoplasm of uterus, part unspecified: Secondary | ICD-10-CM | POA: Diagnosis not present

## 2015-02-06 DIAGNOSIS — Z8541 Personal history of malignant neoplasm of cervix uteri: Secondary | ICD-10-CM | POA: Diagnosis not present

## 2015-02-06 DIAGNOSIS — M1990 Unspecified osteoarthritis, unspecified site: Secondary | ICD-10-CM | POA: Diagnosis not present

## 2015-02-06 DIAGNOSIS — E785 Hyperlipidemia, unspecified: Secondary | ICD-10-CM | POA: Diagnosis not present

## 2015-02-06 DIAGNOSIS — Z8551 Personal history of malignant neoplasm of bladder: Secondary | ICD-10-CM | POA: Diagnosis not present

## 2015-02-07 DIAGNOSIS — M1990 Unspecified osteoarthritis, unspecified site: Secondary | ICD-10-CM | POA: Diagnosis not present

## 2015-02-07 DIAGNOSIS — E1165 Type 2 diabetes mellitus with hyperglycemia: Secondary | ICD-10-CM | POA: Diagnosis not present

## 2015-02-07 DIAGNOSIS — Z8541 Personal history of malignant neoplasm of cervix uteri: Secondary | ICD-10-CM | POA: Diagnosis not present

## 2015-02-07 DIAGNOSIS — E785 Hyperlipidemia, unspecified: Secondary | ICD-10-CM | POA: Diagnosis not present

## 2015-02-07 DIAGNOSIS — C55 Malignant neoplasm of uterus, part unspecified: Secondary | ICD-10-CM | POA: Diagnosis not present

## 2015-02-07 DIAGNOSIS — K219 Gastro-esophageal reflux disease without esophagitis: Secondary | ICD-10-CM | POA: Diagnosis not present

## 2015-02-07 DIAGNOSIS — Z8551 Personal history of malignant neoplasm of bladder: Secondary | ICD-10-CM | POA: Diagnosis not present

## 2015-02-07 DIAGNOSIS — I10 Essential (primary) hypertension: Secondary | ICD-10-CM | POA: Diagnosis not present

## 2015-02-08 ENCOUNTER — Other Ambulatory Visit: Payer: Self-pay

## 2015-02-08 DIAGNOSIS — E1165 Type 2 diabetes mellitus with hyperglycemia: Secondary | ICD-10-CM | POA: Diagnosis not present

## 2015-02-08 DIAGNOSIS — C55 Malignant neoplasm of uterus, part unspecified: Secondary | ICD-10-CM | POA: Diagnosis not present

## 2015-02-08 DIAGNOSIS — Z8551 Personal history of malignant neoplasm of bladder: Secondary | ICD-10-CM | POA: Diagnosis not present

## 2015-02-08 DIAGNOSIS — K219 Gastro-esophageal reflux disease without esophagitis: Secondary | ICD-10-CM | POA: Diagnosis not present

## 2015-02-08 DIAGNOSIS — E785 Hyperlipidemia, unspecified: Secondary | ICD-10-CM | POA: Diagnosis not present

## 2015-02-08 DIAGNOSIS — I10 Essential (primary) hypertension: Secondary | ICD-10-CM | POA: Diagnosis not present

## 2015-02-08 DIAGNOSIS — C541 Malignant neoplasm of endometrium: Secondary | ICD-10-CM

## 2015-02-08 DIAGNOSIS — Z8541 Personal history of malignant neoplasm of cervix uteri: Secondary | ICD-10-CM | POA: Diagnosis not present

## 2015-02-08 DIAGNOSIS — M1990 Unspecified osteoarthritis, unspecified site: Secondary | ICD-10-CM | POA: Diagnosis not present

## 2015-02-09 DIAGNOSIS — K219 Gastro-esophageal reflux disease without esophagitis: Secondary | ICD-10-CM | POA: Diagnosis not present

## 2015-02-09 DIAGNOSIS — Z8551 Personal history of malignant neoplasm of bladder: Secondary | ICD-10-CM | POA: Diagnosis not present

## 2015-02-09 DIAGNOSIS — E785 Hyperlipidemia, unspecified: Secondary | ICD-10-CM | POA: Diagnosis not present

## 2015-02-09 DIAGNOSIS — E1165 Type 2 diabetes mellitus with hyperglycemia: Secondary | ICD-10-CM | POA: Diagnosis not present

## 2015-02-09 DIAGNOSIS — M1990 Unspecified osteoarthritis, unspecified site: Secondary | ICD-10-CM | POA: Diagnosis not present

## 2015-02-09 DIAGNOSIS — Z8541 Personal history of malignant neoplasm of cervix uteri: Secondary | ICD-10-CM | POA: Diagnosis not present

## 2015-02-09 DIAGNOSIS — C55 Malignant neoplasm of uterus, part unspecified: Secondary | ICD-10-CM | POA: Diagnosis not present

## 2015-02-09 DIAGNOSIS — I10 Essential (primary) hypertension: Secondary | ICD-10-CM | POA: Diagnosis not present

## 2015-02-10 ENCOUNTER — Other Ambulatory Visit: Payer: Self-pay | Admitting: Family Medicine

## 2015-02-10 DIAGNOSIS — C55 Malignant neoplasm of uterus, part unspecified: Secondary | ICD-10-CM | POA: Diagnosis not present

## 2015-02-10 DIAGNOSIS — E1165 Type 2 diabetes mellitus with hyperglycemia: Secondary | ICD-10-CM | POA: Diagnosis not present

## 2015-02-10 DIAGNOSIS — I10 Essential (primary) hypertension: Secondary | ICD-10-CM | POA: Diagnosis not present

## 2015-02-10 DIAGNOSIS — Z8551 Personal history of malignant neoplasm of bladder: Secondary | ICD-10-CM | POA: Diagnosis not present

## 2015-02-10 DIAGNOSIS — K219 Gastro-esophageal reflux disease without esophagitis: Secondary | ICD-10-CM | POA: Diagnosis not present

## 2015-02-10 DIAGNOSIS — E785 Hyperlipidemia, unspecified: Secondary | ICD-10-CM | POA: Diagnosis not present

## 2015-02-10 DIAGNOSIS — Z8541 Personal history of malignant neoplasm of cervix uteri: Secondary | ICD-10-CM | POA: Diagnosis not present

## 2015-02-10 DIAGNOSIS — M1990 Unspecified osteoarthritis, unspecified site: Secondary | ICD-10-CM | POA: Diagnosis not present

## 2015-02-11 DIAGNOSIS — K219 Gastro-esophageal reflux disease without esophagitis: Secondary | ICD-10-CM | POA: Diagnosis not present

## 2015-02-11 DIAGNOSIS — E1165 Type 2 diabetes mellitus with hyperglycemia: Secondary | ICD-10-CM | POA: Diagnosis not present

## 2015-02-11 DIAGNOSIS — I10 Essential (primary) hypertension: Secondary | ICD-10-CM | POA: Diagnosis not present

## 2015-02-11 DIAGNOSIS — Z8541 Personal history of malignant neoplasm of cervix uteri: Secondary | ICD-10-CM | POA: Diagnosis not present

## 2015-02-11 DIAGNOSIS — E785 Hyperlipidemia, unspecified: Secondary | ICD-10-CM | POA: Diagnosis not present

## 2015-02-11 DIAGNOSIS — Z8551 Personal history of malignant neoplasm of bladder: Secondary | ICD-10-CM | POA: Diagnosis not present

## 2015-02-11 DIAGNOSIS — C55 Malignant neoplasm of uterus, part unspecified: Secondary | ICD-10-CM | POA: Diagnosis not present

## 2015-02-11 DIAGNOSIS — M1990 Unspecified osteoarthritis, unspecified site: Secondary | ICD-10-CM | POA: Diagnosis not present

## 2015-02-12 ENCOUNTER — Inpatient Hospital Stay: Attending: Hematology and Oncology

## 2015-02-12 ENCOUNTER — Inpatient Hospital Stay (HOSPITAL_BASED_OUTPATIENT_CLINIC_OR_DEPARTMENT_OTHER): Admitting: Hematology and Oncology

## 2015-02-12 VITALS — BP 127/74 | HR 71 | Temp 97.6°F | Wt 156.4 lb

## 2015-02-12 DIAGNOSIS — L89301 Pressure ulcer of unspecified buttock, stage 1: Secondary | ICD-10-CM | POA: Insufficient documentation

## 2015-02-12 DIAGNOSIS — C786 Secondary malignant neoplasm of retroperitoneum and peritoneum: Secondary | ICD-10-CM

## 2015-02-12 DIAGNOSIS — I10 Essential (primary) hypertension: Secondary | ICD-10-CM | POA: Insufficient documentation

## 2015-02-12 DIAGNOSIS — Z8551 Personal history of malignant neoplasm of bladder: Secondary | ICD-10-CM | POA: Diagnosis not present

## 2015-02-12 DIAGNOSIS — A047 Enterocolitis due to Clostridium difficile: Secondary | ICD-10-CM

## 2015-02-12 DIAGNOSIS — K2901 Acute gastritis with bleeding: Secondary | ICD-10-CM

## 2015-02-12 DIAGNOSIS — E1165 Type 2 diabetes mellitus with hyperglycemia: Secondary | ICD-10-CM | POA: Diagnosis not present

## 2015-02-12 DIAGNOSIS — Z8541 Personal history of malignant neoplasm of cervix uteri: Secondary | ICD-10-CM

## 2015-02-12 DIAGNOSIS — Z79899 Other long term (current) drug therapy: Secondary | ICD-10-CM | POA: Insufficient documentation

## 2015-02-12 DIAGNOSIS — C55 Malignant neoplasm of uterus, part unspecified: Secondary | ICD-10-CM | POA: Diagnosis not present

## 2015-02-12 DIAGNOSIS — R32 Unspecified urinary incontinence: Secondary | ICD-10-CM | POA: Diagnosis not present

## 2015-02-12 DIAGNOSIS — E780 Pure hypercholesterolemia: Secondary | ICD-10-CM | POA: Insufficient documentation

## 2015-02-12 DIAGNOSIS — M1990 Unspecified osteoarthritis, unspecified site: Secondary | ICD-10-CM | POA: Diagnosis not present

## 2015-02-12 DIAGNOSIS — K219 Gastro-esophageal reflux disease without esophagitis: Secondary | ICD-10-CM | POA: Insufficient documentation

## 2015-02-12 DIAGNOSIS — I2699 Other pulmonary embolism without acute cor pulmonale: Secondary | ICD-10-CM | POA: Insufficient documentation

## 2015-02-12 DIAGNOSIS — R197 Diarrhea, unspecified: Secondary | ICD-10-CM | POA: Diagnosis not present

## 2015-02-12 DIAGNOSIS — D801 Nonfamilial hypogammaglobulinemia: Secondary | ICD-10-CM | POA: Diagnosis not present

## 2015-02-12 DIAGNOSIS — R5383 Other fatigue: Secondary | ICD-10-CM | POA: Diagnosis not present

## 2015-02-12 DIAGNOSIS — M129 Arthropathy, unspecified: Secondary | ICD-10-CM | POA: Insufficient documentation

## 2015-02-12 DIAGNOSIS — C541 Malignant neoplasm of endometrium: Secondary | ICD-10-CM | POA: Insufficient documentation

## 2015-02-12 DIAGNOSIS — Z794 Long term (current) use of insulin: Secondary | ICD-10-CM

## 2015-02-12 DIAGNOSIS — R531 Weakness: Secondary | ICD-10-CM | POA: Insufficient documentation

## 2015-02-12 DIAGNOSIS — R630 Anorexia: Secondary | ICD-10-CM

## 2015-02-12 DIAGNOSIS — I82409 Acute embolism and thrombosis of unspecified deep veins of unspecified lower extremity: Secondary | ICD-10-CM

## 2015-02-12 DIAGNOSIS — Z7901 Long term (current) use of anticoagulants: Secondary | ICD-10-CM | POA: Diagnosis not present

## 2015-02-12 DIAGNOSIS — E119 Type 2 diabetes mellitus without complications: Secondary | ICD-10-CM | POA: Insufficient documentation

## 2015-02-12 DIAGNOSIS — C549 Malignant neoplasm of corpus uteri, unspecified: Secondary | ICD-10-CM

## 2015-02-12 DIAGNOSIS — D649 Anemia, unspecified: Secondary | ICD-10-CM | POA: Diagnosis not present

## 2015-02-12 DIAGNOSIS — E785 Hyperlipidemia, unspecified: Secondary | ICD-10-CM | POA: Diagnosis not present

## 2015-02-12 DIAGNOSIS — R634 Abnormal weight loss: Secondary | ICD-10-CM

## 2015-02-12 LAB — CBC WITH DIFFERENTIAL/PLATELET
Basophils Absolute: 0.1 10*3/uL (ref 0–0.1)
Basophils Relative: 1 %
Eosinophils Absolute: 0.2 10*3/uL (ref 0–0.7)
Eosinophils Relative: 2 %
HCT: 27.5 % — ABNORMAL LOW (ref 35.0–47.0)
Hemoglobin: 9 g/dL — ABNORMAL LOW (ref 12.0–16.0)
Lymphocytes Relative: 29 %
Lymphs Abs: 2.6 10*3/uL (ref 1.0–3.6)
MCH: 27.8 pg (ref 26.0–34.0)
MCHC: 32.7 g/dL (ref 32.0–36.0)
MCV: 84.8 fL (ref 80.0–100.0)
Monocytes Absolute: 0.6 10*3/uL (ref 0.2–0.9)
Monocytes Relative: 7 %
Neutro Abs: 5.6 10*3/uL (ref 1.4–6.5)
Neutrophils Relative %: 61 %
Platelets: 277 10*3/uL (ref 150–440)
RBC: 3.24 MIL/uL — ABNORMAL LOW (ref 3.80–5.20)
RDW: 17.8 % — ABNORMAL HIGH (ref 11.5–14.5)
WBC: 9.1 10*3/uL (ref 3.6–11.0)

## 2015-02-12 LAB — PROTIME-INR
INR: 1.65
Prothrombin Time: 19.7 seconds — ABNORMAL HIGH (ref 11.4–15.0)

## 2015-02-12 NOTE — Progress Notes (Signed)
[place] [facility] day:  02/12/2015   Chief Complaint: Shelley Fields is a 79 y.o. female with a stage IV endometrial carcinoma and a history of pulmonary embolism on Coumadin who is seen for  1 month assessment.  HPI:  The patient was last seen in medical oncology clinic on 01/15/2015.  At that time, she felt about the same. She notes that she was in bed most of the day.  She spent her time looking at the window. Her weight is "all right". She had been eating. Foley catheter remained in place. She was taking Coumadin 1 mg a day.  INR was sub-therapeutic.  Her Coumadin was increased to 1.5 mg 2 days a week and 1 mg 5 days a week. She was to have an INR checked weekly with the goal of 2.0.  During the interim, she has remained at [employer]. She has made no progress. She does stress and eat on her own. She does not use she needs assistance with the with the restroom and a bath. She is in a wheelchair for a while during the day. She is not getting physical therapy as the insurance time ran out. She needs assistance when ambulating with a rolling walker. Her decubitus is healing. She is eating poorly as she does not like the food they cook. She is drinking Glucerna shakes 3 times a day.  Foley catheter remains in place.  Past Medical History  Diagnosis Date  . Diabetes mellitus without complication   . C. difficile diarrhea   . Uterine cancer   . Hypogammaglobulinemia   . Pulmonary embolism   . Decubitus ulcers   . Hypertension   . Hypercholesterolemia   . Incontinence of urine   . Cancer     Endometrial Cancer  . Cervical cancer     with radiation 45 years ago...  . Arthritis   . GERD (gastroesophageal reflux disease)   . Bladder cancer   . Decubitus ulcer of buttock, stage 1     Past Surgical History  Procedure Laterality Date  . Total abdominal hysterectomy w/ bilateral salpingoophorectomy  07/29/2010  . Stent in leg  2016  . Cataract  extraction, bilateral    . Bladder surgery    . Femoral endarterectomy      right  . Leg surgery      right leg  . Portacath placement      right  . Esophagogastroduodenoscopy N/A 12/14/2014    Procedure: ESOPHAGOGASTRODUODENOSCOPY (EGD);  Surgeon: Hulen Luster, MD;  Location: [facility] ENDOSCOPY;  Service: Endoscopy;  Laterality: N/A;    Family History  Problem Relation Age of Onset  . Hypertension Mother   . Diabetes Mellitus II Mother   . Coronary artery disease Mother   . Hypertension Father   . Diabetes Mellitus II Father   . CAD Father     Social History:  reports that she has never smoked. She does not have any smokeless tobacco history on file. She reports that she does not drink alcohol or use illicit drugs.  The patient is accompanied by her sister, [name].  Her daughter, [name] lives in [name].  Her sister can not care for her.  Her daughter's cell number is [phone].  Allergies:  Allergies  Allergen Reactions  . Contrast Media [Iodinated Diagnostic Agents]     Current Medications: Current Outpatient Prescriptions  Medication Sig Dispense Refill  . amLODipine (NORVASC) 5 MG tablet TAKE 1 TABLET BY  MOUTH EVERY DAY 90 tablet 3  . atenolol (TENORMIN) 100 MG tablet Take 1 tablet by mouth daily.  3  . atenolol (TENORMIN) 50 MG tablet Take 50 mg by mouth daily.    . benazepril (LOTENSIN) 40 MG tablet Take 40 mg by mouth daily.    . cloNIDine (CATAPRES) 0.2 MG tablet Take 0.2 mg by mouth 2 (two) times daily.    . furosemide (LASIX) 20 MG tablet Take 20 mg by mouth every other day.    Marland Kitchen HUMULIN 70/30 (70-30) 100 UNIT/ML injection 16 Units daily at 6 PM.    . HUMULIN 70/30 KWIKPEN (70-30) 100 UNIT/ML PEN     . lovastatin (MEVACOR) 20 MG tablet TAKE 1 TABLET BY MOUTH AT BEDTIME 30 tablet 0  . meclizine (ANTIVERT) 25 MG tablet Take 25 mg by mouth daily.    . megestrol (MEGACE ES) 625 MG/5ML suspension Take 1.6 mLs (200 mg total) by mouth daily. to stimulate appetite 150 mL 0   . mirtazapine (REMERON) 15 MG tablet Take 15 mg by mouth daily.    . mupirocin ointment (BACTROBAN) 2 %     . NOVOLOG FLEXPEN 100 UNIT/ML FlexPen Per Sliding Scale...    . nystatin (MYCOSTATIN) powder     . omeprazole (PRILOSEC) 20 MG capsule Take 20 mg by mouth daily.  12  . potassium chloride (K-DUR) 10 MEQ tablet Take 1 tablet by mouth daily.  0  . SANTYL ointment     . tamsulosin (FLOMAX) 0.4 MG CAPS capsule Take 0.4 mg by mouth 2 (two) times daily.    Marland Kitchen warfarin (COUMADIN) 1 MG tablet Take 1 mg by mouth daily.    . cephALEXin (KEFLEX) 500 MG capsule Take 1 capsule (500 mg total) by mouth 4 (four) times daily. (Patient not taking: Reported on 01/15/2015) 28 capsule 0  . hydrALAZINE (APRESOLINE) 100 MG tablet Take 100 mg by mouth 2 (two) times daily.     No current facility-administered medications for this visit.   Facility-Administered Medications Ordered in Other Visits  Medication Dose Route Frequency Provider Last Rate Last Dose  . sodium chloride 0.9 % injection 10 mL  10 mL Intravenous PRN Evlyn Kanner, NP   10 mL at 11/02/14 7858    Review of Systems:  GENERAL:  General fatigue and weakness. Minimal activity.  No fevers, sweats.  Weight down 8# in 2 months. PERFORMANCE STATUS (ECOG):  3-4 HEENT:  No visual changes, runny nose, sore throat, mouth sores or tenderness. Lungs: No shortness of breath or cough.  No hemoptysis. Cardiac:  No chest pain, palpitations, orthopnea, or PND. GI:  Eating very little (see HPI).  No nausea, vomiting, diarrhea, constipation, melena or hematochezia. GU:  Foley catheter in place.  No urgency, frequency, dysuria, or hematuria. Musculoskeletal:  No back pain.  No joint pain.  No muscle tenderness. Extremities:  No pain or swelling. Skin:  Sacral decubitus ulcer.  Receiving wound care.  Neuro:  General weakness.  No headache, numbness or weakness, balance or coordination issues. Endocrine:  Diabetes.  No thyroid issues, hot flashes or night  sweats. Psych:  No mood changes, depression or anxiety. Pain:  No focal pain. Review of systems:  All other systems reviewed and found to be negative.  Physical Exam: Blood pressure 127/74, pulse 71, temperature 97.6 F (36.4 C), weight 156 lb 6.4 oz (70.943 kg). GENERAL:  Chronically ill appearing woman sitting comfortably in a wheelchair under a blanket in no acute distress. MENTAL STATUS:  Alert and oriented to person, place and time. HEAD: Pearline Cables thin hair.  Normocephalic, atraumatic, face symmetric, no Cushingoid features.   EYES:  Pupils equal round and reactive to light and accommodation.  No conjunctivitis or scleral icterus. ENT:  Oropharynx clear without lesion.  No thrush.  RESPIRATORY:  Clear to auscultation without rales, wheezes or rhonchi. CARDIOVASCULAR:  Regular rate and rhythm without murmur, rub or gallop. ABDOMEN:  Soft, non-tender, with active bowel sounds, and no hepatosplenomegaly.  No masses. SKIN:  Left cheek basal cell carcinoma.  Sacral decubitus ulcer, improved.  Left buttock ecchymosis, resolved. EXTREMITIES: Ankle edema.  No skin discoloration or tenderness.  No palpable cords. NEUROLOGICAL: Unremarkable. PSYCH:  Appropriate.  Appointment on 02/12/2015  Component Date Value Ref Range Status  . Prothrombin Time 02/12/2015 19.7* 11.4 - 15.0 seconds Final  . INR 02/12/2015 1.65   Final  . WBC 02/12/2015 9.1  3.6 - 11.0 K/uL Final  . RBC 02/12/2015 3.24* 3.80 - 5.20 MIL/uL Final  . Hemoglobin 02/12/2015 9.0* 12.0 - 16.0 g/dL Final  . HCT 02/12/2015 27.5* 35.0 - 47.0 % Final  . MCV 02/12/2015 84.8  80.0 - 100.0 fL Final  . MCH 02/12/2015 27.8  26.0 - 34.0 pg Final  . MCHC 02/12/2015 32.7  32.0 - 36.0 g/dL Final  . RDW 02/12/2015 17.8* 11.5 - 14.5 % Final  . Platelets 02/12/2015 277  150 - 440 K/uL Final  . Neutrophils Relative % 02/12/2015 61   Final  . Neutro Abs 02/12/2015 5.6  1.4 - 6.5 K/uL Final  . Lymphocytes Relative 02/12/2015 29   Final  . Lymphs  Abs 02/12/2015 2.6  1.0 - 3.6 K/uL Final  . Monocytes Relative 02/12/2015 7   Final  . Monocytes Absolute 02/12/2015 0.6  0.2 - 0.9 K/uL Final  . Eosinophils Relative 02/12/2015 2   Final  . Eosinophils Absolute 02/12/2015 0.2  0 - 0.7 K/uL Final  . Basophils Relative 02/12/2015 1   Final  . Basophils Absolute 02/12/2015 0.1  0 - 0.1 K/uL Final    Assessment:  EUSTACIA URBANEK is a 79 y.o. female with recurrent stage IV endoemtrial carcinoma.  She underwent TAH/BSO on 07/29/2010.  She was initially felt to have have IIIC disease, but later stage IV disease secondary to likely liver metastasis.  Tumor was ER negative.  She received 11 cycles of carboplatin and Taxol (10/07/2010 until 06/29/2011). She required dose modifications secondary to cytopenias.  She has been followed with serial CA 125s. There has been a steady increase in her CA 125 (1936 on 06/20/2014 and 4186 on 11/21/2014). She has not been treated by Dr. Inez Pilgrim secondary being asymptomatic. Notes indicate imaging studies revealed slight nodal progression.  Her performance status limits the ability to treat her endometrial cancer.  Abdominal and pelvic CT scan on 11/21/2014 revealed progressive nodal metastatic disease throughout the retroperitoneum. A precaval node measured 4.2 x 3.6 cm (previously 3.8 x 3.5 cm) the left periaortic node measured 3.6 x 2.6 cm (previously 3 x 2.4 cm) There was no evidence of extranodal metastasis in the abdomen or pelvis although imaging was limited by nocontrast. There was a persistent concern of a lingular nodule.   Her course has been complicated by a pulmonary embolism in 08/2010. She is on chronic Coumadin (0.5 mg a day). She has a history of recurrent C. difficile infection and hypogammaglobulinemia.  She has a recent history of UTI sepsis, sacral decubitus, and acute renal insufficiency.   She was admitted  to [facility] from 12/12/2014 - 12/17/2014 with an upper GI bleed, Klebsiella UTI with sepsis,  decubitus ulcer, and general debilitation.  Initial hemoglobin was 3.  Initial INR was 1.37.  She was transfused with packed red blood cells.  EGD revealed mild gastropathy involving the antrum.    She is currently living at [facility].  Performance status is poor.  She has a healing decubitus ulcer and is receiving wound care.  She continues to make very little progress.  She denies any abdominal symptoms.  Diet is poor (she does not like the food).  She is losing weight.  INR is 1.65 (sub-therapeutic) on Coumadin 1 mg 5 days/week and 1.5 mg 2 days a week.  She is anemic.  Plan: 1.  Labs today:  CBC with diff, PT/INR. 2.  Increase Coumadin to 1.5 mg 4 days a week (Tues, Thurs, Sat, Sun) and 1 mg 3 days a week (MWF). 3.  PT/INR weekly at [facility].  Results faxed to clinic.  Goal INR 2.0. 4.  Close follow-up of INR given recent history of GI bleed. 5.  Discuss nutrition and caloric intake. 6.  Re-review no plan for chemotherapy. 7.  Complete paperwork for [facility]. 8.  RTC in 1 month for MD assessment and labs (CBC with diff, CMP, CA125, B12, folate, ferritin, PT/INR).   Lequita Asal, MD  02/12/2015, 11:03 AM

## 2015-02-12 NOTE — Progress Notes (Signed)
Patient here for follow up no complaints today. 

## 2015-02-13 DIAGNOSIS — E785 Hyperlipidemia, unspecified: Secondary | ICD-10-CM | POA: Diagnosis not present

## 2015-02-13 DIAGNOSIS — E1165 Type 2 diabetes mellitus with hyperglycemia: Secondary | ICD-10-CM | POA: Diagnosis not present

## 2015-02-13 DIAGNOSIS — C55 Malignant neoplasm of uterus, part unspecified: Secondary | ICD-10-CM | POA: Diagnosis not present

## 2015-02-13 DIAGNOSIS — Z8541 Personal history of malignant neoplasm of cervix uteri: Secondary | ICD-10-CM | POA: Diagnosis not present

## 2015-02-13 DIAGNOSIS — K219 Gastro-esophageal reflux disease without esophagitis: Secondary | ICD-10-CM | POA: Diagnosis not present

## 2015-02-13 DIAGNOSIS — Z8551 Personal history of malignant neoplasm of bladder: Secondary | ICD-10-CM | POA: Diagnosis not present

## 2015-02-13 DIAGNOSIS — I10 Essential (primary) hypertension: Secondary | ICD-10-CM | POA: Diagnosis not present

## 2015-02-13 DIAGNOSIS — M1990 Unspecified osteoarthritis, unspecified site: Secondary | ICD-10-CM | POA: Diagnosis not present

## 2015-02-14 ENCOUNTER — Other Ambulatory Visit: Payer: Self-pay | Admitting: *Deleted

## 2015-02-14 DIAGNOSIS — K219 Gastro-esophageal reflux disease without esophagitis: Secondary | ICD-10-CM | POA: Diagnosis not present

## 2015-02-14 DIAGNOSIS — I10 Essential (primary) hypertension: Secondary | ICD-10-CM | POA: Diagnosis not present

## 2015-02-14 DIAGNOSIS — E1165 Type 2 diabetes mellitus with hyperglycemia: Secondary | ICD-10-CM | POA: Diagnosis not present

## 2015-02-14 DIAGNOSIS — Z8541 Personal history of malignant neoplasm of cervix uteri: Secondary | ICD-10-CM | POA: Diagnosis not present

## 2015-02-14 DIAGNOSIS — C55 Malignant neoplasm of uterus, part unspecified: Secondary | ICD-10-CM | POA: Diagnosis not present

## 2015-02-14 DIAGNOSIS — Z8551 Personal history of malignant neoplasm of bladder: Secondary | ICD-10-CM | POA: Diagnosis not present

## 2015-02-14 DIAGNOSIS — M1990 Unspecified osteoarthritis, unspecified site: Secondary | ICD-10-CM | POA: Diagnosis not present

## 2015-02-14 DIAGNOSIS — E785 Hyperlipidemia, unspecified: Secondary | ICD-10-CM | POA: Diagnosis not present

## 2015-02-14 LAB — PROTIME-INR: INR: 1.5 — AB (ref 0.9–1.1)

## 2015-02-15 ENCOUNTER — Telehealth: Payer: Self-pay | Admitting: *Deleted

## 2015-02-15 DIAGNOSIS — Z8551 Personal history of malignant neoplasm of bladder: Secondary | ICD-10-CM | POA: Diagnosis not present

## 2015-02-15 DIAGNOSIS — C55 Malignant neoplasm of uterus, part unspecified: Secondary | ICD-10-CM | POA: Diagnosis not present

## 2015-02-15 DIAGNOSIS — I10 Essential (primary) hypertension: Secondary | ICD-10-CM | POA: Diagnosis not present

## 2015-02-15 DIAGNOSIS — Z8541 Personal history of malignant neoplasm of cervix uteri: Secondary | ICD-10-CM | POA: Diagnosis not present

## 2015-02-15 DIAGNOSIS — K219 Gastro-esophageal reflux disease without esophagitis: Secondary | ICD-10-CM | POA: Diagnosis not present

## 2015-02-15 DIAGNOSIS — M1990 Unspecified osteoarthritis, unspecified site: Secondary | ICD-10-CM | POA: Diagnosis not present

## 2015-02-15 DIAGNOSIS — E1165 Type 2 diabetes mellitus with hyperglycemia: Secondary | ICD-10-CM | POA: Diagnosis not present

## 2015-02-15 DIAGNOSIS — E785 Hyperlipidemia, unspecified: Secondary | ICD-10-CM | POA: Diagnosis not present

## 2015-02-15 NOTE — Telephone Encounter (Signed)
INR at facility drawn was 1.5. Facility asking for new orders.  Spoke with Dr. Zenia Resides. MD wrote written orders on 02/13/15, which was faxed to the facilty by Sofie Rower, RN.  A new order was written on 02/14/15 by Dr. Mike Gip for the coumadin adjustment.  Order states Coumadin 1.5 mg on Tuesdays, Thursday, Saturday and Sundays; patient will take Coumadin 1 mg on Mondays and Wednesday and Fridays.  This new order was faxed at 1700 at 02/14/15

## 2015-02-16 DIAGNOSIS — I10 Essential (primary) hypertension: Secondary | ICD-10-CM | POA: Diagnosis not present

## 2015-02-16 DIAGNOSIS — M1990 Unspecified osteoarthritis, unspecified site: Secondary | ICD-10-CM | POA: Diagnosis not present

## 2015-02-16 DIAGNOSIS — Z8541 Personal history of malignant neoplasm of cervix uteri: Secondary | ICD-10-CM | POA: Diagnosis not present

## 2015-02-16 DIAGNOSIS — K219 Gastro-esophageal reflux disease without esophagitis: Secondary | ICD-10-CM | POA: Diagnosis not present

## 2015-02-16 DIAGNOSIS — E785 Hyperlipidemia, unspecified: Secondary | ICD-10-CM | POA: Diagnosis not present

## 2015-02-16 DIAGNOSIS — E1165 Type 2 diabetes mellitus with hyperglycemia: Secondary | ICD-10-CM | POA: Diagnosis not present

## 2015-02-16 DIAGNOSIS — C55 Malignant neoplasm of uterus, part unspecified: Secondary | ICD-10-CM | POA: Diagnosis not present

## 2015-02-16 DIAGNOSIS — Z8551 Personal history of malignant neoplasm of bladder: Secondary | ICD-10-CM | POA: Diagnosis not present

## 2015-02-17 ENCOUNTER — Encounter: Payer: Self-pay | Admitting: Hematology and Oncology

## 2015-02-17 DIAGNOSIS — R634 Abnormal weight loss: Secondary | ICD-10-CM | POA: Insufficient documentation

## 2015-02-17 DIAGNOSIS — E1165 Type 2 diabetes mellitus with hyperglycemia: Secondary | ICD-10-CM | POA: Diagnosis not present

## 2015-02-17 DIAGNOSIS — I10 Essential (primary) hypertension: Secondary | ICD-10-CM | POA: Diagnosis not present

## 2015-02-17 DIAGNOSIS — E785 Hyperlipidemia, unspecified: Secondary | ICD-10-CM | POA: Diagnosis not present

## 2015-02-17 DIAGNOSIS — Z8541 Personal history of malignant neoplasm of cervix uteri: Secondary | ICD-10-CM | POA: Diagnosis not present

## 2015-02-17 DIAGNOSIS — M1990 Unspecified osteoarthritis, unspecified site: Secondary | ICD-10-CM | POA: Diagnosis not present

## 2015-02-17 DIAGNOSIS — K219 Gastro-esophageal reflux disease without esophagitis: Secondary | ICD-10-CM | POA: Diagnosis not present

## 2015-02-17 DIAGNOSIS — Z8551 Personal history of malignant neoplasm of bladder: Secondary | ICD-10-CM | POA: Diagnosis not present

## 2015-02-17 DIAGNOSIS — C55 Malignant neoplasm of uterus, part unspecified: Secondary | ICD-10-CM | POA: Diagnosis not present

## 2015-02-17 DIAGNOSIS — D649 Anemia, unspecified: Secondary | ICD-10-CM | POA: Insufficient documentation

## 2015-02-18 DIAGNOSIS — E785 Hyperlipidemia, unspecified: Secondary | ICD-10-CM | POA: Diagnosis not present

## 2015-02-18 DIAGNOSIS — Z8551 Personal history of malignant neoplasm of bladder: Secondary | ICD-10-CM | POA: Diagnosis not present

## 2015-02-18 DIAGNOSIS — K219 Gastro-esophageal reflux disease without esophagitis: Secondary | ICD-10-CM | POA: Diagnosis not present

## 2015-02-18 DIAGNOSIS — E1165 Type 2 diabetes mellitus with hyperglycemia: Secondary | ICD-10-CM | POA: Diagnosis not present

## 2015-02-18 DIAGNOSIS — I10 Essential (primary) hypertension: Secondary | ICD-10-CM | POA: Diagnosis not present

## 2015-02-18 DIAGNOSIS — C55 Malignant neoplasm of uterus, part unspecified: Secondary | ICD-10-CM | POA: Diagnosis not present

## 2015-02-18 DIAGNOSIS — M1990 Unspecified osteoarthritis, unspecified site: Secondary | ICD-10-CM | POA: Diagnosis not present

## 2015-02-18 DIAGNOSIS — Z8541 Personal history of malignant neoplasm of cervix uteri: Secondary | ICD-10-CM | POA: Diagnosis not present

## 2015-02-19 DIAGNOSIS — E785 Hyperlipidemia, unspecified: Secondary | ICD-10-CM | POA: Diagnosis not present

## 2015-02-19 DIAGNOSIS — I10 Essential (primary) hypertension: Secondary | ICD-10-CM | POA: Diagnosis not present

## 2015-02-19 DIAGNOSIS — Z8541 Personal history of malignant neoplasm of cervix uteri: Secondary | ICD-10-CM | POA: Diagnosis not present

## 2015-02-19 DIAGNOSIS — K219 Gastro-esophageal reflux disease without esophagitis: Secondary | ICD-10-CM | POA: Diagnosis not present

## 2015-02-19 DIAGNOSIS — E1165 Type 2 diabetes mellitus with hyperglycemia: Secondary | ICD-10-CM | POA: Diagnosis not present

## 2015-02-19 DIAGNOSIS — Z8551 Personal history of malignant neoplasm of bladder: Secondary | ICD-10-CM | POA: Diagnosis not present

## 2015-02-19 DIAGNOSIS — M1990 Unspecified osteoarthritis, unspecified site: Secondary | ICD-10-CM | POA: Diagnosis not present

## 2015-02-19 DIAGNOSIS — C55 Malignant neoplasm of uterus, part unspecified: Secondary | ICD-10-CM | POA: Diagnosis not present

## 2015-02-20 ENCOUNTER — Telehealth: Payer: Self-pay | Admitting: *Deleted

## 2015-02-20 DIAGNOSIS — K219 Gastro-esophageal reflux disease without esophagitis: Secondary | ICD-10-CM | POA: Diagnosis not present

## 2015-02-20 DIAGNOSIS — Z8551 Personal history of malignant neoplasm of bladder: Secondary | ICD-10-CM | POA: Diagnosis not present

## 2015-02-20 DIAGNOSIS — C55 Malignant neoplasm of uterus, part unspecified: Secondary | ICD-10-CM | POA: Diagnosis not present

## 2015-02-20 DIAGNOSIS — E785 Hyperlipidemia, unspecified: Secondary | ICD-10-CM | POA: Diagnosis not present

## 2015-02-20 DIAGNOSIS — Z8541 Personal history of malignant neoplasm of cervix uteri: Secondary | ICD-10-CM | POA: Diagnosis not present

## 2015-02-20 DIAGNOSIS — M1990 Unspecified osteoarthritis, unspecified site: Secondary | ICD-10-CM | POA: Diagnosis not present

## 2015-02-20 DIAGNOSIS — E1165 Type 2 diabetes mellitus with hyperglycemia: Secondary | ICD-10-CM | POA: Diagnosis not present

## 2015-02-20 DIAGNOSIS — I10 Essential (primary) hypertension: Secondary | ICD-10-CM | POA: Diagnosis not present

## 2015-02-20 NOTE — Telephone Encounter (Signed)
[employer] today rregarding new orders from Dr Mike Gip. I spoke with nurse Arbie Cookey LPN and verbally gave her orders for 3 separate stool guiacs x 3 and request for a lab draw tomm at facility CBC. I am also faxing a wrtitten order to facility at [phone] with instructions to fax [place] those results.

## 2015-02-21 DIAGNOSIS — I10 Essential (primary) hypertension: Secondary | ICD-10-CM | POA: Diagnosis not present

## 2015-02-21 DIAGNOSIS — K219 Gastro-esophageal reflux disease without esophagitis: Secondary | ICD-10-CM | POA: Diagnosis not present

## 2015-02-21 DIAGNOSIS — E785 Hyperlipidemia, unspecified: Secondary | ICD-10-CM | POA: Diagnosis not present

## 2015-02-21 DIAGNOSIS — C55 Malignant neoplasm of uterus, part unspecified: Secondary | ICD-10-CM | POA: Diagnosis not present

## 2015-02-21 DIAGNOSIS — Z8541 Personal history of malignant neoplasm of cervix uteri: Secondary | ICD-10-CM | POA: Diagnosis not present

## 2015-02-21 DIAGNOSIS — E1165 Type 2 diabetes mellitus with hyperglycemia: Secondary | ICD-10-CM | POA: Diagnosis not present

## 2015-02-21 DIAGNOSIS — M1990 Unspecified osteoarthritis, unspecified site: Secondary | ICD-10-CM | POA: Diagnosis not present

## 2015-02-21 DIAGNOSIS — Z8551 Personal history of malignant neoplasm of bladder: Secondary | ICD-10-CM | POA: Diagnosis not present

## 2015-02-22 DIAGNOSIS — I10 Essential (primary) hypertension: Secondary | ICD-10-CM | POA: Diagnosis not present

## 2015-02-22 DIAGNOSIS — Z8541 Personal history of malignant neoplasm of cervix uteri: Secondary | ICD-10-CM | POA: Diagnosis not present

## 2015-02-22 DIAGNOSIS — C55 Malignant neoplasm of uterus, part unspecified: Secondary | ICD-10-CM | POA: Diagnosis not present

## 2015-02-22 DIAGNOSIS — E1165 Type 2 diabetes mellitus with hyperglycemia: Secondary | ICD-10-CM | POA: Diagnosis not present

## 2015-02-22 DIAGNOSIS — K219 Gastro-esophageal reflux disease without esophagitis: Secondary | ICD-10-CM | POA: Diagnosis not present

## 2015-02-22 DIAGNOSIS — Z8551 Personal history of malignant neoplasm of bladder: Secondary | ICD-10-CM | POA: Diagnosis not present

## 2015-02-22 DIAGNOSIS — E785 Hyperlipidemia, unspecified: Secondary | ICD-10-CM | POA: Diagnosis not present

## 2015-02-22 DIAGNOSIS — M1990 Unspecified osteoarthritis, unspecified site: Secondary | ICD-10-CM | POA: Diagnosis not present

## 2015-02-23 DIAGNOSIS — I10 Essential (primary) hypertension: Secondary | ICD-10-CM | POA: Diagnosis not present

## 2015-02-23 DIAGNOSIS — K219 Gastro-esophageal reflux disease without esophagitis: Secondary | ICD-10-CM | POA: Diagnosis not present

## 2015-02-23 DIAGNOSIS — Z8541 Personal history of malignant neoplasm of cervix uteri: Secondary | ICD-10-CM | POA: Diagnosis not present

## 2015-02-23 DIAGNOSIS — E785 Hyperlipidemia, unspecified: Secondary | ICD-10-CM | POA: Diagnosis not present

## 2015-02-23 DIAGNOSIS — E1165 Type 2 diabetes mellitus with hyperglycemia: Secondary | ICD-10-CM | POA: Diagnosis not present

## 2015-02-23 DIAGNOSIS — Z8551 Personal history of malignant neoplasm of bladder: Secondary | ICD-10-CM | POA: Diagnosis not present

## 2015-02-23 DIAGNOSIS — M1990 Unspecified osteoarthritis, unspecified site: Secondary | ICD-10-CM | POA: Diagnosis not present

## 2015-02-23 DIAGNOSIS — C55 Malignant neoplasm of uterus, part unspecified: Secondary | ICD-10-CM | POA: Diagnosis not present

## 2015-02-24 DIAGNOSIS — I10 Essential (primary) hypertension: Secondary | ICD-10-CM | POA: Diagnosis not present

## 2015-02-24 DIAGNOSIS — E1165 Type 2 diabetes mellitus with hyperglycemia: Secondary | ICD-10-CM | POA: Diagnosis not present

## 2015-02-24 DIAGNOSIS — M1990 Unspecified osteoarthritis, unspecified site: Secondary | ICD-10-CM | POA: Diagnosis not present

## 2015-02-24 DIAGNOSIS — Z8551 Personal history of malignant neoplasm of bladder: Secondary | ICD-10-CM | POA: Diagnosis not present

## 2015-02-24 DIAGNOSIS — Z8541 Personal history of malignant neoplasm of cervix uteri: Secondary | ICD-10-CM | POA: Diagnosis not present

## 2015-02-24 DIAGNOSIS — E785 Hyperlipidemia, unspecified: Secondary | ICD-10-CM | POA: Diagnosis not present

## 2015-02-24 DIAGNOSIS — C55 Malignant neoplasm of uterus, part unspecified: Secondary | ICD-10-CM | POA: Diagnosis not present

## 2015-02-24 DIAGNOSIS — K219 Gastro-esophageal reflux disease without esophagitis: Secondary | ICD-10-CM | POA: Diagnosis not present

## 2015-02-25 DIAGNOSIS — Z8551 Personal history of malignant neoplasm of bladder: Secondary | ICD-10-CM | POA: Diagnosis not present

## 2015-02-25 DIAGNOSIS — Z8541 Personal history of malignant neoplasm of cervix uteri: Secondary | ICD-10-CM | POA: Diagnosis not present

## 2015-02-25 DIAGNOSIS — I4891 Unspecified atrial fibrillation: Secondary | ICD-10-CM | POA: Diagnosis not present

## 2015-02-25 DIAGNOSIS — I119 Hypertensive heart disease without heart failure: Secondary | ICD-10-CM | POA: Diagnosis not present

## 2015-02-25 DIAGNOSIS — E785 Hyperlipidemia, unspecified: Secondary | ICD-10-CM | POA: Diagnosis not present

## 2015-02-25 DIAGNOSIS — I5032 Chronic diastolic (congestive) heart failure: Secondary | ICD-10-CM | POA: Diagnosis not present

## 2015-02-25 DIAGNOSIS — L89150 Pressure ulcer of sacral region, unstageable: Secondary | ICD-10-CM | POA: Diagnosis not present

## 2015-02-25 DIAGNOSIS — E1165 Type 2 diabetes mellitus with hyperglycemia: Secondary | ICD-10-CM | POA: Diagnosis not present

## 2015-02-25 DIAGNOSIS — K219 Gastro-esophageal reflux disease without esophagitis: Secondary | ICD-10-CM | POA: Diagnosis not present

## 2015-02-25 DIAGNOSIS — I10 Essential (primary) hypertension: Secondary | ICD-10-CM | POA: Diagnosis not present

## 2015-02-25 DIAGNOSIS — M1990 Unspecified osteoarthritis, unspecified site: Secondary | ICD-10-CM | POA: Diagnosis not present

## 2015-02-25 DIAGNOSIS — C55 Malignant neoplasm of uterus, part unspecified: Secondary | ICD-10-CM | POA: Diagnosis not present

## 2015-02-26 DIAGNOSIS — I10 Essential (primary) hypertension: Secondary | ICD-10-CM | POA: Diagnosis not present

## 2015-02-26 DIAGNOSIS — E1165 Type 2 diabetes mellitus with hyperglycemia: Secondary | ICD-10-CM | POA: Diagnosis not present

## 2015-02-26 DIAGNOSIS — Z8551 Personal history of malignant neoplasm of bladder: Secondary | ICD-10-CM | POA: Diagnosis not present

## 2015-02-26 DIAGNOSIS — K219 Gastro-esophageal reflux disease without esophagitis: Secondary | ICD-10-CM | POA: Diagnosis not present

## 2015-02-26 DIAGNOSIS — C55 Malignant neoplasm of uterus, part unspecified: Secondary | ICD-10-CM | POA: Diagnosis not present

## 2015-02-26 DIAGNOSIS — M1990 Unspecified osteoarthritis, unspecified site: Secondary | ICD-10-CM | POA: Diagnosis not present

## 2015-02-26 DIAGNOSIS — Z8541 Personal history of malignant neoplasm of cervix uteri: Secondary | ICD-10-CM | POA: Diagnosis not present

## 2015-02-26 DIAGNOSIS — E785 Hyperlipidemia, unspecified: Secondary | ICD-10-CM | POA: Diagnosis not present

## 2015-02-27 ENCOUNTER — Inpatient Hospital Stay (HOSPITAL_BASED_OUTPATIENT_CLINIC_OR_DEPARTMENT_OTHER): Admitting: Obstetrics and Gynecology

## 2015-02-27 ENCOUNTER — Encounter: Payer: Self-pay | Admitting: Obstetrics and Gynecology

## 2015-02-27 VITALS — BP 136/84 | HR 77 | Temp 96.8°F | Resp 18 | Ht 60.0 in

## 2015-02-27 DIAGNOSIS — E1165 Type 2 diabetes mellitus with hyperglycemia: Secondary | ICD-10-CM | POA: Diagnosis not present

## 2015-02-27 DIAGNOSIS — C541 Malignant neoplasm of endometrium: Secondary | ICD-10-CM

## 2015-02-27 DIAGNOSIS — R531 Weakness: Secondary | ICD-10-CM | POA: Diagnosis not present

## 2015-02-27 DIAGNOSIS — Z7901 Long term (current) use of anticoagulants: Secondary | ICD-10-CM | POA: Diagnosis not present

## 2015-02-27 DIAGNOSIS — I10 Essential (primary) hypertension: Secondary | ICD-10-CM | POA: Diagnosis not present

## 2015-02-27 DIAGNOSIS — C55 Malignant neoplasm of uterus, part unspecified: Secondary | ICD-10-CM | POA: Diagnosis not present

## 2015-02-27 DIAGNOSIS — D801 Nonfamilial hypogammaglobulinemia: Secondary | ICD-10-CM | POA: Diagnosis not present

## 2015-02-27 DIAGNOSIS — A047 Enterocolitis due to Clostridium difficile: Secondary | ICD-10-CM | POA: Diagnosis not present

## 2015-02-27 DIAGNOSIS — Z79899 Other long term (current) drug therapy: Secondary | ICD-10-CM | POA: Diagnosis not present

## 2015-02-27 DIAGNOSIS — E119 Type 2 diabetes mellitus without complications: Secondary | ICD-10-CM | POA: Diagnosis not present

## 2015-02-27 DIAGNOSIS — C786 Secondary malignant neoplasm of retroperitoneum and peritoneum: Secondary | ICD-10-CM | POA: Diagnosis not present

## 2015-02-27 DIAGNOSIS — R32 Unspecified urinary incontinence: Secondary | ICD-10-CM | POA: Diagnosis not present

## 2015-02-27 DIAGNOSIS — Z794 Long term (current) use of insulin: Secondary | ICD-10-CM | POA: Diagnosis not present

## 2015-02-27 DIAGNOSIS — M1990 Unspecified osteoarthritis, unspecified site: Secondary | ICD-10-CM | POA: Diagnosis not present

## 2015-02-27 DIAGNOSIS — R630 Anorexia: Secondary | ICD-10-CM | POA: Diagnosis not present

## 2015-02-27 DIAGNOSIS — E780 Pure hypercholesterolemia: Secondary | ICD-10-CM | POA: Diagnosis not present

## 2015-02-27 DIAGNOSIS — I2699 Other pulmonary embolism without acute cor pulmonale: Secondary | ICD-10-CM | POA: Diagnosis not present

## 2015-02-27 DIAGNOSIS — L89301 Pressure ulcer of unspecified buttock, stage 1: Secondary | ICD-10-CM | POA: Diagnosis not present

## 2015-02-27 DIAGNOSIS — D649 Anemia, unspecified: Secondary | ICD-10-CM | POA: Diagnosis not present

## 2015-02-27 DIAGNOSIS — R5383 Other fatigue: Secondary | ICD-10-CM | POA: Diagnosis not present

## 2015-02-27 DIAGNOSIS — R197 Diarrhea, unspecified: Secondary | ICD-10-CM | POA: Diagnosis not present

## 2015-02-27 DIAGNOSIS — K219 Gastro-esophageal reflux disease without esophagitis: Secondary | ICD-10-CM | POA: Diagnosis not present

## 2015-02-27 DIAGNOSIS — M129 Arthropathy, unspecified: Secondary | ICD-10-CM | POA: Diagnosis not present

## 2015-02-27 DIAGNOSIS — Z8551 Personal history of malignant neoplasm of bladder: Secondary | ICD-10-CM | POA: Diagnosis not present

## 2015-02-27 DIAGNOSIS — E785 Hyperlipidemia, unspecified: Secondary | ICD-10-CM | POA: Diagnosis not present

## 2015-02-27 DIAGNOSIS — Z8541 Personal history of malignant neoplasm of cervix uteri: Secondary | ICD-10-CM | POA: Diagnosis not present

## 2015-02-27 NOTE — Progress Notes (Signed)
Gynecologic Oncology Interval Note  Referring Provider: Dr. Mike Gip  Chief Concern: Endometrial cancer surveillance.   Subjective:  Shelley Fields is a 79 y.o. woman who presents today for continued surveillance for history of high grade serous endometrial cancer.  She continues to be surprisingly asymptomatic. Light spotting vaginally.  Continues on coumadin due to history of LLE VTE and PE.  Also has foley cath in due to leakage.  And has sacral decubitus being treated with dressings at the nursing home.      Treatment history Cervix cancer treated with external and intracavitary radiation in the 1960s.  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 She was initially felt to have have IIIC high grade serous endometrial cancer, but later stage IV disease secondary to likely liver metastasis. Tumor was ER negative. Post-operative course complicated by delayed recovery, C. diff infection and wound dehiscence in the radiated field.  She received 11 cycles of carboplatin and Taxol (10/07/2010 until 06/29/2011). She required dose modifications secondary to cytopenias.  In 12/2011 had recurrence vaginal apex, close monitorig due to multiple medical problems, the patient opted against chemotherapy.  She has been followed with serial CA 125s. There has been a steady increase in her CA 125 (1,936 on 06/20/2014 and 4186 on 11/21/2014). She has not been treated by Dr. Inez Pilgrim secondary being asymptomatic. Notes indicate imaging studies revealed slight nodal progression. Her performance status limits the ability to treat her endometrial cancer.  Abdominal and pelvic CT scan on 11/21/2014 revealed progressive nodal metastatic disease throughout the retroperitoneum. A precaval node measured 4.2 x 3.6 cm (previously 3.8 x 3.5 cm) the left periaortic node measured 3.6 x 2.6 cm  (previously 3 x 2.4 cm) There was no evidence of extranodal metastasis in the abdomen or pelvis although imaging was limited by nocontrast. There was a persistent concern of a lingular nodule.   Her course has been complicated by a pulmonary embolism in 08/2010. She is on chronic Coumadin (0.5 mg a day). She has a history of recurrent C. difficile infection and hypogammaglobulinemia. She has a recent history of UTI sepsis, sacral decubitus, and acute renal insufficiency.   She was admitted to [facility] from 12/12/2014 - 12/17/2014 with an upper GI bleed, Klebsiella UTI with sepsis, decubitus ulcer, and general debilitation. Initial hemoglobin was 3. Initial INR was 1.37. She was transfused with packed red blood cells. EGD revealed mild gastropathy involving the antrum.   She is currently living at [facility]. Performance status is poor. She has a healing decubitus ulcer and is receiving wound care. She continues to make very little progress. She denies any abdominal symptoms. Diet is poor (she does not like the food). She is losing weight. INR is 1.65 (sub-therapeutic) on Coumadin 1 mg 5 days/week and 1.5 mg 2 days a week. She is anemic.  November 21, 2013 Pelvic exam revealed 8 mm thin small polyp at the left vaginal apex.    Problem List: Patient Active Problem List   Diagnosis Date Noted  . Anemia 02/17/2015  . Weight loss 02/17/2015  . Severe anemia 12/12/2014  . GI bleed 12/12/2014  . DM (diabetes mellitus), type 2, uncontrolled 12/12/2014  . Diabetes mellitus with hyperglycemia 12/12/2014  . Leucocytosis 12/12/2014  . UTI (urinary tract infection) 12/12/2014  . Decubitus ulcer of back, stage 2 12/12/2014  . HTN (hypertension) 12/12/2014  . Anorexia 11/23/2014  . Encounter for  other specified administrative purpose 07/10/2013  . Referral of patient 07/10/2013  . Candidal vulvovaginitis 10/14/2012  . Candida vaginitis 10/14/2012  . Polypharmacy 06/23/2012  . Long term  current use of opiate analgesic 06/23/2012  . Breast lump 06/10/2012  . Laboratory examination 04/12/2012  . Diagnostic skin and sensitization tests 04/12/2012  . Bladder infection, chronic 03/23/2012  . Total urinary incontinence 03/23/2012  . 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 Female genuine stress incontinence 03/23/2012  . Disorder of bladder function 03/23/2012  . Incomplete bladder emptying 03/23/2012  . 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 Cystitis, radiation 03/23/2012  . Cancer of body of uterus 03/23/2012  . Cancer of corpus uteri, except isthmus 03/23/2012  . Bladder neoplasm of uncertain malignant potential 03/23/2012  . Infection of urinary tract 03/23/2012  . Colon polyp 01/06/2012  . Difficulty in walking 01/06/2012  . Diabetes 01/06/2012  . Deep vein thrombosis 01/06/2012  . Cancer of endometrium 01/06/2012  . Contact with and exposure to tuberculosis 01/06/2012  . BP (high blood pressure) 01/06/2012  . Angiopathy, peripheral 01/06/2012  . Abnormal presence of protein in urine 01/06/2012  . Retinopathy 01/06/2012    Past Medical History: Past Medical History  Diagnosis Date  . Diabetes mellitus without complication   . C. difficile diarrhea   . Uterine cancer   . Hypogammaglobulinemia   . Pulmonary embolism   . Decubitus ulcers   . Hypertension   . Hypercholesterolemia   . Incontinence of urine   . Cancer     Endometrial Cancer  . Cervical cancer     with radiation 45 years ago...  . Arthritis   . GERD (gastroesophageal reflux disease)   . Bladder cancer   . Decubitus ulcer of buttock, stage 1     Past Surgical History: Past Surgical History  Procedure Laterality Date  . Total abdominal hysterectomy w/ bilateral salpingoophorectomy  07/29/2010  . Stent in leg  2016  . Cataract extraction, bilateral    . Bladder surgery    . Femoral endarterectomy      right  . Leg surgery      right leg  . Portacath placement      right  .  Esophagogastroduodenoscopy N/A 12/14/2014    Procedure: ESOPHAGOGASTRODUODENOSCOPY (EGD);  Surgeon: Hulen Luster, MD;  Location: [facility] ENDOSCOPY;  Service: Endoscopy;  Laterality: N/A;    Family History: Family History  Problem Relation Age of Onset  . Hypertension Mother   . Diabetes Mellitus II Mother   . Coronary artery disease Mother   . Hypertension Father   . Diabetes Mellitus II Father   . CAD Father     Social History: Social History   Social History  . Marital Status: Widowed    Spouse Name: N/A  . Number of Children: N/A  . Years of Education: N/A   Occupational History  . Not on file.   Social History Main Topics  . Smoking status: Never Smoker   . Smokeless tobacco: Not on file  . Alcohol Use: No  . Drug Use: No  . Sexual Activity: Not on file   Other Topics Concern  . Not on file   Social History Narrative    Allergies: Allergies  Allergen Reactions  . Contrast Media [Iodinated Diagnostic Agents]     Current Medications: Current Outpatient Prescriptions  Medication Sig Dispense Refill  . amLODipine (NORVASC) 5 MG tablet TAKE 1 TABLET BY MOUTH EVERY DAY 90 tablet 3  . atenolol (TENORMIN) 50 MG tablet  Take 50 mg by mouth daily.    . benazepril (LOTENSIN) 40 MG tablet Take 40 mg by mouth daily.    . furosemide (LASIX) 20 MG tablet Take 20 mg by mouth every other day.    Marland Kitchen HUMULIN 70/30 (70-30) 100 UNIT/ML injection 16 Units daily at 6 PM.    . HUMULIN 70/30 KWIKPEN (70-30) 100 UNIT/ML PEN     . hydrALAZINE (APRESOLINE) 100 MG tablet Take 100 mg by mouth 2 (two) times daily.    . megestrol (MEGACE ES) 625 MG/5ML suspension Take 1.6 mLs (200 mg total) by mouth daily. to stimulate appetite 150 mL 0  . mirtazapine (REMERON) 15 MG tablet Take 15 mg by mouth daily.    . mupirocin ointment (BACTROBAN) 2 %     . NOVOLOG FLEXPEN 100 UNIT/ML FlexPen Per Sliding Scale...    . nystatin (MYCOSTATIN) powder     . omeprazole (PRILOSEC) 20 MG capsule Take 20 mg by  mouth daily.  12  . SANTYL ointment     . warfarin (COUMADIN) 1 MG tablet Take 1 mg by mouth daily.    Marland Kitchen atenolol (TENORMIN) 100 MG tablet Take 1 tablet by mouth daily.  3  . cephALEXin (KEFLEX) 500 MG capsule Take 1 capsule (500 mg total) by mouth 4 (four) times daily. (Patient not taking: Reported on 01/15/2015) 28 capsule 0  . cloNIDine (CATAPRES) 0.2 MG tablet Take 0.2 mg by mouth 2 (two) times daily.    Marland Kitchen lovastatin (MEVACOR) 20 MG tablet TAKE 1 TABLET BY MOUTH AT BEDTIME 30 tablet 0  . meclizine (ANTIVERT) 25 MG tablet Take 25 mg by mouth daily.    . potassium chloride (K-DUR) 10 MEQ tablet Take 1 tablet by mouth daily.  0  . tamsulosin (FLOMAX) 0.4 MG CAPS capsule Take 0.4 mg by mouth 2 (two) times daily.     No current facility-administered medications for this visit.   Facility-Administered Medications Ordered in Other Visits  Medication Dose Route Frequency Provider Last Rate Last Dose  . sodium chloride 0.9 % injection 10 mL  10 mL Intravenous PRN Evlyn Kanner, NP   10 mL at 11/02/14 9798      Review of Systems Pertinent items are noted in HPI.  Objective:  BP 136/84 mmHg  Pulse 77  Temp(Src) 96.8 F (36 C) (Tympanic)  Resp 18  Ht 5' (1.524 m)  Wt   ECOG Performance Status: 2 - Symptomatic, <50% confined to bed  General appearance: alert, cooperative and appears stated age HEENT:PERRLA, sclera clear, anicteric and thyroid without masses Lymph node survey: non-palpable, axillary, inguinal, supraclavicular Cardiovascular: regular rate and rhythm Respiratory: normal air entry, lungs clear to auscultation Breast exam: breasts appear normal, no suspicious masses, no skin or nipple changes or axillary nodes, not examined. Abdomen: soft, non-tender, without masses or organomegaly, no hernias and well healed incision Back: inspection of back is normal Extremities: edema 2+ Skin exam - normal coloration and turgor, no rashes, no suspicious skin lesions  noted. Neurological exam reveals alert, oriented, normal speech, no focal findings or movement disorder noted.  Pelvic: exam chaperoned by nurse;  Vulva: normal appearing vulva with no masses, tenderness or lesions, Foley catheter in place.  Dressing on sacral decubitus; Vagina: fleshy mass 1 cm in left fornix and this is cancer; Bimanual: induration in upper vagina. Rectal: not indicated   Assessment:  Shelley Fields is a 79 y.o. female with a history of recurrent high grade serous uterine cancer in vagina  and retroperitoneal nodes.  She has fairly indolent disease over the past three years despite no treatment.  In addition, she has a poor performance status and lives in a nursing home.       Plan:   Problem List Items Addressed This Visit      Genitourinary   Cancer of endometrium - Primary     Mrs. Kemnitz will continue evaluation and treatment following Dr.Corcoran's recommendations. She knows that chemotherapy treatment will be the only real option if there are increased cancer related symptoms or continued tumor progression.  However, her decreased PS and medical problems could limited the ability to deliver further chemo. She has not had chemo in several years and has had minimal progression in the vagina an retroperitoneal nodes.  In view of her poor performance status it may be worth trying single agent carboplatin at some point in the future.  I do not think that hormonal therapy is likely to be beneficial, but could be an option if she cannot tolerate chemotherapy.   Suggested return to see [place] in 6 months and will follow up with Dr. Mike Gip in the interim. She will contact us in the interval with any concerning symptoms.  All of her questions were answered.   Mellody Drown, MD  CC:  Arlis Porta., MD [address] [place], [place] [address] [phone]

## 2015-02-27 NOTE — Progress Notes (Signed)
Assisted physician with pelvic exam. 

## 2015-02-28 DIAGNOSIS — M1990 Unspecified osteoarthritis, unspecified site: Secondary | ICD-10-CM | POA: Diagnosis not present

## 2015-02-28 DIAGNOSIS — Z8541 Personal history of malignant neoplasm of cervix uteri: Secondary | ICD-10-CM | POA: Diagnosis not present

## 2015-02-28 DIAGNOSIS — E785 Hyperlipidemia, unspecified: Secondary | ICD-10-CM | POA: Diagnosis not present

## 2015-02-28 DIAGNOSIS — E1165 Type 2 diabetes mellitus with hyperglycemia: Secondary | ICD-10-CM | POA: Diagnosis not present

## 2015-02-28 DIAGNOSIS — K219 Gastro-esophageal reflux disease without esophagitis: Secondary | ICD-10-CM | POA: Diagnosis not present

## 2015-02-28 DIAGNOSIS — C55 Malignant neoplasm of uterus, part unspecified: Secondary | ICD-10-CM | POA: Diagnosis not present

## 2015-02-28 DIAGNOSIS — Z8551 Personal history of malignant neoplasm of bladder: Secondary | ICD-10-CM | POA: Diagnosis not present

## 2015-02-28 DIAGNOSIS — I10 Essential (primary) hypertension: Secondary | ICD-10-CM | POA: Diagnosis not present

## 2015-03-01 DIAGNOSIS — E1165 Type 2 diabetes mellitus with hyperglycemia: Secondary | ICD-10-CM | POA: Diagnosis not present

## 2015-03-01 DIAGNOSIS — E785 Hyperlipidemia, unspecified: Secondary | ICD-10-CM | POA: Diagnosis not present

## 2015-03-01 DIAGNOSIS — M1990 Unspecified osteoarthritis, unspecified site: Secondary | ICD-10-CM | POA: Diagnosis not present

## 2015-03-01 DIAGNOSIS — C55 Malignant neoplasm of uterus, part unspecified: Secondary | ICD-10-CM | POA: Diagnosis not present

## 2015-03-01 DIAGNOSIS — K219 Gastro-esophageal reflux disease without esophagitis: Secondary | ICD-10-CM | POA: Diagnosis not present

## 2015-03-01 DIAGNOSIS — Z8541 Personal history of malignant neoplasm of cervix uteri: Secondary | ICD-10-CM | POA: Diagnosis not present

## 2015-03-01 DIAGNOSIS — Z8551 Personal history of malignant neoplasm of bladder: Secondary | ICD-10-CM | POA: Diagnosis not present

## 2015-03-01 DIAGNOSIS — I10 Essential (primary) hypertension: Secondary | ICD-10-CM | POA: Diagnosis not present

## 2015-03-02 DIAGNOSIS — K219 Gastro-esophageal reflux disease without esophagitis: Secondary | ICD-10-CM | POA: Diagnosis not present

## 2015-03-02 DIAGNOSIS — E1165 Type 2 diabetes mellitus with hyperglycemia: Secondary | ICD-10-CM | POA: Diagnosis not present

## 2015-03-02 DIAGNOSIS — C55 Malignant neoplasm of uterus, part unspecified: Secondary | ICD-10-CM | POA: Diagnosis not present

## 2015-03-02 DIAGNOSIS — Z8551 Personal history of malignant neoplasm of bladder: Secondary | ICD-10-CM | POA: Diagnosis not present

## 2015-03-02 DIAGNOSIS — Z8541 Personal history of malignant neoplasm of cervix uteri: Secondary | ICD-10-CM | POA: Diagnosis not present

## 2015-03-02 DIAGNOSIS — M1990 Unspecified osteoarthritis, unspecified site: Secondary | ICD-10-CM | POA: Diagnosis not present

## 2015-03-02 DIAGNOSIS — I10 Essential (primary) hypertension: Secondary | ICD-10-CM | POA: Diagnosis not present

## 2015-03-02 DIAGNOSIS — E785 Hyperlipidemia, unspecified: Secondary | ICD-10-CM | POA: Diagnosis not present

## 2015-03-03 DIAGNOSIS — K219 Gastro-esophageal reflux disease without esophagitis: Secondary | ICD-10-CM | POA: Diagnosis not present

## 2015-03-03 DIAGNOSIS — Z8541 Personal history of malignant neoplasm of cervix uteri: Secondary | ICD-10-CM | POA: Diagnosis not present

## 2015-03-03 DIAGNOSIS — E785 Hyperlipidemia, unspecified: Secondary | ICD-10-CM | POA: Diagnosis not present

## 2015-03-03 DIAGNOSIS — Z8551 Personal history of malignant neoplasm of bladder: Secondary | ICD-10-CM | POA: Diagnosis not present

## 2015-03-03 DIAGNOSIS — I10 Essential (primary) hypertension: Secondary | ICD-10-CM | POA: Diagnosis not present

## 2015-03-03 DIAGNOSIS — E1165 Type 2 diabetes mellitus with hyperglycemia: Secondary | ICD-10-CM | POA: Diagnosis not present

## 2015-03-03 DIAGNOSIS — M1990 Unspecified osteoarthritis, unspecified site: Secondary | ICD-10-CM | POA: Diagnosis not present

## 2015-03-03 DIAGNOSIS — C55 Malignant neoplasm of uterus, part unspecified: Secondary | ICD-10-CM | POA: Diagnosis not present

## 2015-03-04 DIAGNOSIS — K219 Gastro-esophageal reflux disease without esophagitis: Secondary | ICD-10-CM | POA: Diagnosis not present

## 2015-03-04 DIAGNOSIS — Z8551 Personal history of malignant neoplasm of bladder: Secondary | ICD-10-CM | POA: Diagnosis not present

## 2015-03-04 DIAGNOSIS — Z8541 Personal history of malignant neoplasm of cervix uteri: Secondary | ICD-10-CM | POA: Diagnosis not present

## 2015-03-04 DIAGNOSIS — C55 Malignant neoplasm of uterus, part unspecified: Secondary | ICD-10-CM | POA: Diagnosis not present

## 2015-03-04 DIAGNOSIS — M1990 Unspecified osteoarthritis, unspecified site: Secondary | ICD-10-CM | POA: Diagnosis not present

## 2015-03-04 DIAGNOSIS — E1165 Type 2 diabetes mellitus with hyperglycemia: Secondary | ICD-10-CM | POA: Diagnosis not present

## 2015-03-04 DIAGNOSIS — E785 Hyperlipidemia, unspecified: Secondary | ICD-10-CM | POA: Diagnosis not present

## 2015-03-04 DIAGNOSIS — I10 Essential (primary) hypertension: Secondary | ICD-10-CM | POA: Diagnosis not present

## 2015-03-05 ENCOUNTER — Ambulatory Visit: Payer: Medicare Other | Admitting: Hematology and Oncology

## 2015-03-05 ENCOUNTER — Other Ambulatory Visit: Payer: Medicare Other

## 2015-03-05 DIAGNOSIS — E1165 Type 2 diabetes mellitus with hyperglycemia: Secondary | ICD-10-CM | POA: Diagnosis not present

## 2015-03-05 DIAGNOSIS — C55 Malignant neoplasm of uterus, part unspecified: Secondary | ICD-10-CM | POA: Diagnosis not present

## 2015-03-05 DIAGNOSIS — E785 Hyperlipidemia, unspecified: Secondary | ICD-10-CM | POA: Diagnosis not present

## 2015-03-05 DIAGNOSIS — I10 Essential (primary) hypertension: Secondary | ICD-10-CM | POA: Diagnosis not present

## 2015-03-05 DIAGNOSIS — Z8551 Personal history of malignant neoplasm of bladder: Secondary | ICD-10-CM | POA: Diagnosis not present

## 2015-03-05 DIAGNOSIS — Z8541 Personal history of malignant neoplasm of cervix uteri: Secondary | ICD-10-CM | POA: Diagnosis not present

## 2015-03-05 DIAGNOSIS — M1990 Unspecified osteoarthritis, unspecified site: Secondary | ICD-10-CM | POA: Diagnosis not present

## 2015-03-05 DIAGNOSIS — K219 Gastro-esophageal reflux disease without esophagitis: Secondary | ICD-10-CM | POA: Diagnosis not present

## 2015-03-06 DIAGNOSIS — M1990 Unspecified osteoarthritis, unspecified site: Secondary | ICD-10-CM | POA: Diagnosis not present

## 2015-03-06 DIAGNOSIS — C55 Malignant neoplasm of uterus, part unspecified: Secondary | ICD-10-CM | POA: Diagnosis not present

## 2015-03-06 DIAGNOSIS — Z8551 Personal history of malignant neoplasm of bladder: Secondary | ICD-10-CM | POA: Diagnosis not present

## 2015-03-06 DIAGNOSIS — I10 Essential (primary) hypertension: Secondary | ICD-10-CM | POA: Diagnosis not present

## 2015-03-06 DIAGNOSIS — E785 Hyperlipidemia, unspecified: Secondary | ICD-10-CM | POA: Diagnosis not present

## 2015-03-06 DIAGNOSIS — Z8541 Personal history of malignant neoplasm of cervix uteri: Secondary | ICD-10-CM | POA: Diagnosis not present

## 2015-03-06 DIAGNOSIS — K219 Gastro-esophageal reflux disease without esophagitis: Secondary | ICD-10-CM | POA: Diagnosis not present

## 2015-03-06 DIAGNOSIS — E1165 Type 2 diabetes mellitus with hyperglycemia: Secondary | ICD-10-CM | POA: Diagnosis not present

## 2015-03-07 DIAGNOSIS — Z8551 Personal history of malignant neoplasm of bladder: Secondary | ICD-10-CM | POA: Diagnosis not present

## 2015-03-07 DIAGNOSIS — M1990 Unspecified osteoarthritis, unspecified site: Secondary | ICD-10-CM | POA: Diagnosis not present

## 2015-03-07 DIAGNOSIS — C55 Malignant neoplasm of uterus, part unspecified: Secondary | ICD-10-CM | POA: Diagnosis not present

## 2015-03-07 DIAGNOSIS — I10 Essential (primary) hypertension: Secondary | ICD-10-CM | POA: Diagnosis not present

## 2015-03-07 DIAGNOSIS — Z8541 Personal history of malignant neoplasm of cervix uteri: Secondary | ICD-10-CM | POA: Diagnosis not present

## 2015-03-07 DIAGNOSIS — E1165 Type 2 diabetes mellitus with hyperglycemia: Secondary | ICD-10-CM | POA: Diagnosis not present

## 2015-03-07 DIAGNOSIS — K219 Gastro-esophageal reflux disease without esophagitis: Secondary | ICD-10-CM | POA: Diagnosis not present

## 2015-03-07 DIAGNOSIS — E785 Hyperlipidemia, unspecified: Secondary | ICD-10-CM | POA: Diagnosis not present

## 2015-03-08 DIAGNOSIS — Z8551 Personal history of malignant neoplasm of bladder: Secondary | ICD-10-CM | POA: Diagnosis not present

## 2015-03-08 DIAGNOSIS — M1990 Unspecified osteoarthritis, unspecified site: Secondary | ICD-10-CM | POA: Diagnosis not present

## 2015-03-08 DIAGNOSIS — C55 Malignant neoplasm of uterus, part unspecified: Secondary | ICD-10-CM | POA: Diagnosis not present

## 2015-03-08 DIAGNOSIS — K219 Gastro-esophageal reflux disease without esophagitis: Secondary | ICD-10-CM | POA: Diagnosis not present

## 2015-03-08 DIAGNOSIS — E785 Hyperlipidemia, unspecified: Secondary | ICD-10-CM | POA: Diagnosis not present

## 2015-03-08 DIAGNOSIS — E1165 Type 2 diabetes mellitus with hyperglycemia: Secondary | ICD-10-CM | POA: Diagnosis not present

## 2015-03-08 DIAGNOSIS — Z8541 Personal history of malignant neoplasm of cervix uteri: Secondary | ICD-10-CM | POA: Diagnosis not present

## 2015-03-08 DIAGNOSIS — I10 Essential (primary) hypertension: Secondary | ICD-10-CM | POA: Diagnosis not present

## 2015-03-09 DIAGNOSIS — M1990 Unspecified osteoarthritis, unspecified site: Secondary | ICD-10-CM | POA: Diagnosis not present

## 2015-03-09 DIAGNOSIS — E785 Hyperlipidemia, unspecified: Secondary | ICD-10-CM | POA: Diagnosis not present

## 2015-03-09 DIAGNOSIS — Z8551 Personal history of malignant neoplasm of bladder: Secondary | ICD-10-CM | POA: Diagnosis not present

## 2015-03-09 DIAGNOSIS — E1165 Type 2 diabetes mellitus with hyperglycemia: Secondary | ICD-10-CM | POA: Diagnosis not present

## 2015-03-09 DIAGNOSIS — I10 Essential (primary) hypertension: Secondary | ICD-10-CM | POA: Diagnosis not present

## 2015-03-09 DIAGNOSIS — K219 Gastro-esophageal reflux disease without esophagitis: Secondary | ICD-10-CM | POA: Diagnosis not present

## 2015-03-09 DIAGNOSIS — C55 Malignant neoplasm of uterus, part unspecified: Secondary | ICD-10-CM | POA: Diagnosis not present

## 2015-03-09 DIAGNOSIS — Z8541 Personal history of malignant neoplasm of cervix uteri: Secondary | ICD-10-CM | POA: Diagnosis not present

## 2015-03-10 DIAGNOSIS — E1165 Type 2 diabetes mellitus with hyperglycemia: Secondary | ICD-10-CM | POA: Diagnosis not present

## 2015-03-10 DIAGNOSIS — Z8551 Personal history of malignant neoplasm of bladder: Secondary | ICD-10-CM | POA: Diagnosis not present

## 2015-03-10 DIAGNOSIS — K219 Gastro-esophageal reflux disease without esophagitis: Secondary | ICD-10-CM | POA: Diagnosis not present

## 2015-03-10 DIAGNOSIS — M1990 Unspecified osteoarthritis, unspecified site: Secondary | ICD-10-CM | POA: Diagnosis not present

## 2015-03-10 DIAGNOSIS — Z8541 Personal history of malignant neoplasm of cervix uteri: Secondary | ICD-10-CM | POA: Diagnosis not present

## 2015-03-10 DIAGNOSIS — E785 Hyperlipidemia, unspecified: Secondary | ICD-10-CM | POA: Diagnosis not present

## 2015-03-10 DIAGNOSIS — I10 Essential (primary) hypertension: Secondary | ICD-10-CM | POA: Diagnosis not present

## 2015-03-10 DIAGNOSIS — C55 Malignant neoplasm of uterus, part unspecified: Secondary | ICD-10-CM | POA: Diagnosis not present

## 2015-03-11 DIAGNOSIS — M1990 Unspecified osteoarthritis, unspecified site: Secondary | ICD-10-CM | POA: Diagnosis not present

## 2015-03-11 DIAGNOSIS — K219 Gastro-esophageal reflux disease without esophagitis: Secondary | ICD-10-CM | POA: Diagnosis not present

## 2015-03-11 DIAGNOSIS — I10 Essential (primary) hypertension: Secondary | ICD-10-CM | POA: Diagnosis not present

## 2015-03-11 DIAGNOSIS — E1165 Type 2 diabetes mellitus with hyperglycemia: Secondary | ICD-10-CM | POA: Diagnosis not present

## 2015-03-11 DIAGNOSIS — E785 Hyperlipidemia, unspecified: Secondary | ICD-10-CM | POA: Diagnosis not present

## 2015-03-11 DIAGNOSIS — Z8551 Personal history of malignant neoplasm of bladder: Secondary | ICD-10-CM | POA: Diagnosis not present

## 2015-03-11 DIAGNOSIS — C55 Malignant neoplasm of uterus, part unspecified: Secondary | ICD-10-CM | POA: Diagnosis not present

## 2015-03-11 DIAGNOSIS — Z8541 Personal history of malignant neoplasm of cervix uteri: Secondary | ICD-10-CM | POA: Diagnosis not present

## 2015-03-12 DIAGNOSIS — E785 Hyperlipidemia, unspecified: Secondary | ICD-10-CM | POA: Diagnosis not present

## 2015-03-12 DIAGNOSIS — Z8541 Personal history of malignant neoplasm of cervix uteri: Secondary | ICD-10-CM | POA: Diagnosis not present

## 2015-03-12 DIAGNOSIS — K219 Gastro-esophageal reflux disease without esophagitis: Secondary | ICD-10-CM | POA: Diagnosis not present

## 2015-03-12 DIAGNOSIS — M1990 Unspecified osteoarthritis, unspecified site: Secondary | ICD-10-CM | POA: Diagnosis not present

## 2015-03-12 DIAGNOSIS — C55 Malignant neoplasm of uterus, part unspecified: Secondary | ICD-10-CM | POA: Diagnosis not present

## 2015-03-12 DIAGNOSIS — I10 Essential (primary) hypertension: Secondary | ICD-10-CM | POA: Diagnosis not present

## 2015-03-12 DIAGNOSIS — E1165 Type 2 diabetes mellitus with hyperglycemia: Secondary | ICD-10-CM | POA: Diagnosis not present

## 2015-03-12 DIAGNOSIS — Z8551 Personal history of malignant neoplasm of bladder: Secondary | ICD-10-CM | POA: Diagnosis not present

## 2015-03-13 DIAGNOSIS — C55 Malignant neoplasm of uterus, part unspecified: Secondary | ICD-10-CM | POA: Diagnosis not present

## 2015-03-13 DIAGNOSIS — E785 Hyperlipidemia, unspecified: Secondary | ICD-10-CM | POA: Diagnosis not present

## 2015-03-13 DIAGNOSIS — Z8551 Personal history of malignant neoplasm of bladder: Secondary | ICD-10-CM | POA: Diagnosis not present

## 2015-03-13 DIAGNOSIS — M1990 Unspecified osteoarthritis, unspecified site: Secondary | ICD-10-CM | POA: Diagnosis not present

## 2015-03-13 DIAGNOSIS — K219 Gastro-esophageal reflux disease without esophagitis: Secondary | ICD-10-CM | POA: Diagnosis not present

## 2015-03-13 DIAGNOSIS — I10 Essential (primary) hypertension: Secondary | ICD-10-CM | POA: Diagnosis not present

## 2015-03-13 DIAGNOSIS — E1165 Type 2 diabetes mellitus with hyperglycemia: Secondary | ICD-10-CM | POA: Diagnosis not present

## 2015-03-13 DIAGNOSIS — Z8541 Personal history of malignant neoplasm of cervix uteri: Secondary | ICD-10-CM | POA: Diagnosis not present

## 2015-03-14 ENCOUNTER — Inpatient Hospital Stay (HOSPITAL_BASED_OUTPATIENT_CLINIC_OR_DEPARTMENT_OTHER): Admitting: Hematology and Oncology

## 2015-03-14 ENCOUNTER — Inpatient Hospital Stay: Attending: Hematology and Oncology

## 2015-03-14 ENCOUNTER — Encounter: Payer: Self-pay | Admitting: Hematology and Oncology

## 2015-03-14 ENCOUNTER — Ambulatory Visit: Payer: Medicare Other | Admitting: Hematology and Oncology

## 2015-03-14 ENCOUNTER — Other Ambulatory Visit: Payer: Medicare Other

## 2015-03-14 ENCOUNTER — Other Ambulatory Visit: Payer: Self-pay | Admitting: Hematology and Oncology

## 2015-03-14 VITALS — BP 132/71 | HR 75 | Temp 97.8°F | Resp 18 | Ht 60.0 in

## 2015-03-14 DIAGNOSIS — Z86711 Personal history of pulmonary embolism: Secondary | ICD-10-CM | POA: Diagnosis not present

## 2015-03-14 DIAGNOSIS — D649 Anemia, unspecified: Secondary | ICD-10-CM

## 2015-03-14 DIAGNOSIS — Z8541 Personal history of malignant neoplasm of cervix uteri: Secondary | ICD-10-CM | POA: Insufficient documentation

## 2015-03-14 DIAGNOSIS — K219 Gastro-esophageal reflux disease without esophagitis: Secondary | ICD-10-CM | POA: Insufficient documentation

## 2015-03-14 DIAGNOSIS — D801 Nonfamilial hypogammaglobulinemia: Secondary | ICD-10-CM

## 2015-03-14 DIAGNOSIS — C55 Malignant neoplasm of uterus, part unspecified: Secondary | ICD-10-CM | POA: Diagnosis not present

## 2015-03-14 DIAGNOSIS — I10 Essential (primary) hypertension: Secondary | ICD-10-CM | POA: Diagnosis not present

## 2015-03-14 DIAGNOSIS — R5383 Other fatigue: Secondary | ICD-10-CM | POA: Diagnosis not present

## 2015-03-14 DIAGNOSIS — Z79899 Other long term (current) drug therapy: Secondary | ICD-10-CM | POA: Diagnosis not present

## 2015-03-14 DIAGNOSIS — E785 Hyperlipidemia, unspecified: Secondary | ICD-10-CM | POA: Diagnosis not present

## 2015-03-14 DIAGNOSIS — I739 Peripheral vascular disease, unspecified: Secondary | ICD-10-CM | POA: Insufficient documentation

## 2015-03-14 DIAGNOSIS — Z794 Long term (current) use of insulin: Secondary | ICD-10-CM | POA: Diagnosis not present

## 2015-03-14 DIAGNOSIS — Z87440 Personal history of urinary (tract) infections: Secondary | ICD-10-CM | POA: Insufficient documentation

## 2015-03-14 DIAGNOSIS — E1165 Type 2 diabetes mellitus with hyperglycemia: Secondary | ICD-10-CM | POA: Diagnosis not present

## 2015-03-14 DIAGNOSIS — C541 Malignant neoplasm of endometrium: Secondary | ICD-10-CM

## 2015-03-14 DIAGNOSIS — R32 Unspecified urinary incontinence: Secondary | ICD-10-CM | POA: Diagnosis not present

## 2015-03-14 DIAGNOSIS — Z8619 Personal history of other infectious and parasitic diseases: Secondary | ICD-10-CM | POA: Diagnosis not present

## 2015-03-14 DIAGNOSIS — M129 Arthropathy, unspecified: Secondary | ICD-10-CM

## 2015-03-14 DIAGNOSIS — L89301 Pressure ulcer of unspecified buttock, stage 1: Secondary | ICD-10-CM

## 2015-03-14 DIAGNOSIS — Z8551 Personal history of malignant neoplasm of bladder: Secondary | ICD-10-CM | POA: Diagnosis not present

## 2015-03-14 DIAGNOSIS — E7800 Pure hypercholesterolemia, unspecified: Secondary | ICD-10-CM

## 2015-03-14 DIAGNOSIS — C772 Secondary and unspecified malignant neoplasm of intra-abdominal lymph nodes: Secondary | ICD-10-CM

## 2015-03-14 DIAGNOSIS — I82599 Chronic embolism and thrombosis of other specified deep vein of unspecified lower extremity: Secondary | ICD-10-CM

## 2015-03-14 DIAGNOSIS — M1990 Unspecified osteoarthritis, unspecified site: Secondary | ICD-10-CM | POA: Diagnosis not present

## 2015-03-14 DIAGNOSIS — E119 Type 2 diabetes mellitus without complications: Secondary | ICD-10-CM

## 2015-03-14 DIAGNOSIS — I82409 Acute embolism and thrombosis of unspecified deep veins of unspecified lower extremity: Secondary | ICD-10-CM

## 2015-03-14 DIAGNOSIS — C549 Malignant neoplasm of corpus uteri, unspecified: Secondary | ICD-10-CM

## 2015-03-14 DIAGNOSIS — E538 Deficiency of other specified B group vitamins: Secondary | ICD-10-CM | POA: Insufficient documentation

## 2015-03-14 LAB — PROTIME-INR
INR: 1.17
Prothrombin Time: 15.1 seconds — ABNORMAL HIGH (ref 11.4–15.0)

## 2015-03-14 LAB — CBC WITH DIFFERENTIAL/PLATELET
Basophils Absolute: 0.1 10*3/uL (ref 0–0.1)
Basophils Relative: 1 %
Eosinophils Absolute: 0.2 10*3/uL (ref 0–0.7)
Eosinophils Relative: 3 %
HCT: 28 % — ABNORMAL LOW (ref 35.0–47.0)
Hemoglobin: 9 g/dL — ABNORMAL LOW (ref 12.0–16.0)
Lymphocytes Relative: 33 %
Lymphs Abs: 2.9 10*3/uL (ref 1.0–3.6)
MCH: 28.3 pg (ref 26.0–34.0)
MCHC: 32.2 g/dL (ref 32.0–36.0)
MCV: 88 fL (ref 80.0–100.0)
Monocytes Absolute: 0.6 10*3/uL (ref 0.2–0.9)
Monocytes Relative: 7 %
Neutro Abs: 4.9 10*3/uL (ref 1.4–6.5)
Neutrophils Relative %: 56 %
Platelets: 225 10*3/uL (ref 150–440)
RBC: 3.19 MIL/uL — ABNORMAL LOW (ref 3.80–5.20)
RDW: 17.9 % — ABNORMAL HIGH (ref 11.5–14.5)
WBC: 8.7 10*3/uL (ref 3.6–11.0)

## 2015-03-14 LAB — COMPREHENSIVE METABOLIC PANEL
ALT: 10 U/L — ABNORMAL LOW (ref 14–54)
AST: 18 U/L (ref 15–41)
Albumin: 2.6 g/dL — ABNORMAL LOW (ref 3.5–5.0)
Alkaline Phosphatase: 34 U/L — ABNORMAL LOW (ref 38–126)
Anion gap: 7 (ref 5–15)
BUN: 38 mg/dL — ABNORMAL HIGH (ref 6–20)
CO2: 17 mmol/L — ABNORMAL LOW (ref 22–32)
Calcium: 7.7 mg/dL — ABNORMAL LOW (ref 8.9–10.3)
Chloride: 114 mmol/L — ABNORMAL HIGH (ref 101–111)
Creatinine, Ser: 1.53 mg/dL — ABNORMAL HIGH (ref 0.44–1.00)
GFR calc Af Amer: 37 mL/min — ABNORMAL LOW (ref >60)
GFR calc non Af Amer: 32 mL/min — ABNORMAL LOW (ref >60)
Glucose, Bld: 297 mg/dL — ABNORMAL HIGH (ref 65–99)
Potassium: 4 mmol/L (ref 3.5–5.1)
Sodium: 138 mmol/L (ref 135–145)
Total Bilirubin: 0.3 mg/dL (ref 0.3–1.2)
Total Protein: 6.7 g/dL (ref 6.5–8.1)

## 2015-03-14 LAB — FERRITIN: Ferritin: 154 ng/mL (ref 11–307)

## 2015-03-14 LAB — FOLATE: Folate: 13.8 ng/mL (ref >5.9)

## 2015-03-14 NOTE — Progress Notes (Signed)
Pt reports one spell of being nauseated 03/11/2015 ate breakfast and could not eat the rest of the day

## 2015-03-14 NOTE — Progress Notes (Signed)
[facility] day:  03/14/2015    Chief Complaint: Shelley Fields is a 79 y.o. female with a stage IV endometrial carcinoma and a history of pulmonary embolism on Coumadin who is seen for  1 month assessment.  HPI: The patient was last seen in the medical oncology clinic on 02/12/2015.  At that time, she had made no progress at [facility]. She remained dependent on assistance for her activities of daily living.   She was in a wheelchair for a while during the day. She was not getting physical therapy as the insurance time ran out.  She was eating poorly as she does not like the food.  Her INR was sub-therapeutic.  Coumadin was adjusted.  She was to have INRs drawn weekly and faxed to our office for continued adjustment (INR goal 2-3).  During the interim, she has remained fully dependent.  She is unable to dress, use the restroom or shower independently.  She is on her same dose of Coumadin.  Her diet is better.  She denies any bruising or bleeding.  Past Medical History  Diagnosis Date  . Diabetes mellitus without complication   . C. difficile diarrhea   . Uterine cancer   . Hypogammaglobulinemia   . Pulmonary embolism   . Decubitus ulcers   . Hypertension   . Hypercholesterolemia   . Incontinence of urine   . Cancer     Endometrial Cancer  . Cervical cancer     with radiation 45 years ago...  . Arthritis   . GERD (gastroesophageal reflux disease)   . Bladder cancer   . Decubitus ulcer of buttock, stage 1     Past Surgical History  Procedure Laterality Date  . Total abdominal hysterectomy w/ bilateral salpingoophorectomy  07/29/2010  . Stent in leg  2016  . Cataract extraction, bilateral    . Bladder surgery    . Femoral endarterectomy      right  . Leg surgery      right leg  . Portacath placement      right  . Esophagogastroduodenoscopy N/A 12/14/2014    Procedure: ESOPHAGOGASTRODUODENOSCOPY (EGD);  Surgeon: Hulen Luster, MD;  Location: [facility] ENDOSCOPY;  Service: Endoscopy;  Laterality: N/A;    Family History  Problem Relation Age of Onset  . Hypertension Mother   . Diabetes Mellitus II Mother   . Coronary artery disease Mother   . Hypertension Father   . Diabetes Mellitus II Father   . CAD Father     Social History:  reports that she has never smoked. She does not have any smokeless tobacco history on file. She reports that she does not drink alcohol or use illicit drugs.  The patient is accompanied by her sister, [name].  Her daughter, [name] lives in [name].  Her sister can not care for her.  Her daughter's cell number is [phone].  Allergies:  Allergies  Allergen Reactions  . Contrast Media [Iodinated Diagnostic Agents]     Current Medications: Current Outpatient Prescriptions  Medication Sig Dispense Refill  . amLODipine (NORVASC) 5 MG tablet TAKE 1 TABLET BY MOUTH EVERY DAY 90 tablet 3  . atenolol (TENORMIN) 100 MG tablet Take 1 tablet by mouth daily.  3  . atenolol (TENORMIN) 50 MG tablet Take 50 mg by mouth daily.    . benazepril (LOTENSIN) 40 MG tablet Take 40 mg by mouth daily.    . cephALEXin (KEFLEX) 500  MG capsule Take 1 capsule (500 mg total) by mouth 4 (four) times daily. 28 capsule 0  . cloNIDine (CATAPRES) 0.2 MG tablet Take 0.2 mg by mouth 2 (two) times daily.    . furosemide (LASIX) 20 MG tablet Take 20 mg by mouth every other day.    Marland Kitchen HUMULIN 70/30 (70-30) 100 UNIT/ML injection 16 Units daily at 6 PM.    . HUMULIN 70/30 KWIKPEN (70-30) 100 UNIT/ML PEN     . hydrALAZINE (APRESOLINE) 100 MG tablet Take 100 mg by mouth 2 (two) times daily.    Marland Kitchen lovastatin (MEVACOR) 20 MG tablet TAKE 1 TABLET BY MOUTH AT BEDTIME 30 tablet 0  . meclizine (ANTIVERT) 25 MG tablet Take 25 mg by mouth daily.    . megestrol (MEGACE ES) 625 MG/5ML suspension Take 1.6 mLs (200 mg total) by mouth daily. to stimulate appetite 150 mL 0  . mirtazapine (REMERON) 15 MG tablet Take 15 mg by mouth  daily.    . mupirocin ointment (BACTROBAN) 2 %     . NOVOLOG FLEXPEN 100 UNIT/ML FlexPen Per Sliding Scale...    . nystatin (MYCOSTATIN) powder     . omeprazole (PRILOSEC) 20 MG capsule Take 20 mg by mouth daily.  12  . potassium chloride (K-DUR) 10 MEQ tablet Take 1 tablet by mouth daily.  0  . SANTYL ointment     . tamsulosin (FLOMAX) 0.4 MG CAPS capsule Take 0.4 mg by mouth 2 (two) times daily.    Marland Kitchen warfarin (COUMADIN) 1 MG tablet Take 1 mg by mouth daily.     No current facility-administered medications for this visit.   Facility-Administered Medications Ordered in Other Visits  Medication Dose Route Frequency Provider Last Rate Last Dose  . sodium chloride 0.9 % injection 10 mL  10 mL Intravenous PRN Evlyn Kanner, NP   10 mL at 11/02/14 5208    Review of Systems:  GENERAL:  General fatigue. Minimal activity (chronic).  No fevers, sweats.  Weight unable to be obtained. PERFORMANCE STATUS (ECOG):  3-4 HEENT:  No visual changes, runny nose, sore throat, mouth sores or tenderness. Lungs: No shortness of breath or cough.  No hemoptysis. Cardiac:  No chest pain, palpitations, orthopnea, or PND. GI:  Eating better (see HPI).  No nausea, vomiting, diarrhea, constipation, melena or hematochezia. GU:  Foley.  No urgency, frequency, dysuria, or hematuria. Musculoskeletal:  No back pain.  No joint pain.  No muscle tenderness. Extremities:  No pain or swelling. Skin:  Sacral decubitus ulcer, receiving wound care.  Neuro:  General weakness.  No headache, numbness or weakness, balance or coordination issues. Endocrine:  Diabetes.  No thyroid issues, hot flashes or night sweats. Psych:  No mood changes, depression or anxiety. Pain:  No focal pain. Review of systems:  All other systems reviewed and found to be negative.  Physical Exam: Blood pressure 132/71, pulse 75, temperature 97.8 F (36.6 C), temperature source Tympanic, resp. rate 18, height 5' (1.524 m). GENERAL:  Chronically ill  appearing woman sitting comfortably in a wheelchair under a blanket in no acute distress. MENTAL STATUS:  Alert and oriented to person, place and time. HEAD: Pearline Cables thin hair.  Normocephalic, atraumatic, face symmetric, no Cushingoid features.   EYES:  Pupils equal round and reactive to light and accommodation.  No conjunctivitis or scleral icterus. ENT:  Oropharynx clear without lesion.  No thrush.  RESPIRATORY:  Poor respiratory excursion.  Clear to auscultation without rales, wheezes or rhonchi. CARDIOVASCULAR:  Regular  rate and rhythm without murmur, rub or gallop. ABDOMEN:  Soft, non-tender, with active bowel sounds, and no hepatosplenomegaly.  No masses. SKIN:  Left cheek basal cell carcinoma.   EXTREMITIES: Bilateral 1+ lower extremity edema (left > right).  No skin discoloration or tenderness.  No palpable cords. NEUROLOGICAL: Unremarkable. PSYCH:  Appropriate.  Appointment on 03/14/2015  Component Date Value Ref Range Status  . WBC 03/14/2015 8.7  3.6 - 11.0 K/uL Final  . RBC 03/14/2015 3.19* 3.80 - 5.20 MIL/uL Final  . Hemoglobin 03/14/2015 9.0* 12.0 - 16.0 g/dL Final  . HCT 03/14/2015 28.0* 35.0 - 47.0 % Final  . MCV 03/14/2015 88.0  80.0 - 100.0 fL Final  . MCH 03/14/2015 28.3  26.0 - 34.0 pg Final  . MCHC 03/14/2015 32.2  32.0 - 36.0 g/dL Final  . RDW 03/14/2015 17.9* 11.5 - 14.5 % Final  . Platelets 03/14/2015 225  150 - 440 K/uL Final  . Neutrophils Relative % 03/14/2015 56   Final  . Neutro Abs 03/14/2015 4.9  1.4 - 6.5 K/uL Final  . Lymphocytes Relative 03/14/2015 33   Final  . Lymphs Abs 03/14/2015 2.9  1.0 - 3.6 K/uL Final  . Monocytes Relative 03/14/2015 7   Final  . Monocytes Absolute 03/14/2015 0.6  0.2 - 0.9 K/uL Final  . Eosinophils Relative 03/14/2015 3   Final  . Eosinophils Absolute 03/14/2015 0.2  0 - 0.7 K/uL Final  . Basophils Relative 03/14/2015 1   Final  . Basophils Absolute 03/14/2015 0.1  0 - 0.1 K/uL Final  . Sodium 03/14/2015 138  135 - 145 mmol/L  Final  . Potassium 03/14/2015 4.0  3.5 - 5.1 mmol/L Final  . Chloride 03/14/2015 114* 101 - 111 mmol/L Final  . CO2 03/14/2015 17* 22 - 32 mmol/L Final  . Glucose, Bld 03/14/2015 297* 65 - 99 mg/dL Final  . BUN 03/14/2015 38* 6 - 20 mg/dL Final  . Creatinine, Ser 03/14/2015 1.53* 0.44 - 1.00 mg/dL Final  . Calcium 03/14/2015 7.7* 8.9 - 10.3 mg/dL Final  . Total Protein 03/14/2015 6.7  6.5 - 8.1 g/dL Final  . Albumin 03/14/2015 2.6* 3.5 - 5.0 g/dL Final  . AST 03/14/2015 18  15 - 41 U/L Final  . ALT 03/14/2015 10* 14 - 54 U/L Final  . Alkaline Phosphatase 03/14/2015 34* 38 - 126 U/L Final  . Total Bilirubin 03/14/2015 0.3  0.3 - 1.2 mg/dL Final  . GFR calc non Af Amer 03/14/2015 32* >60 mL/min Final  . GFR calc Af Amer 03/14/2015 37* >60 mL/min Final   Comment: (NOTE) The eGFR has been calculated using the CKD EPI equation. This calculation has not been validated in all clinical situations. eGFR's persistently <60 mL/min signify possible Chronic Kidney Disease.   . Anion gap 03/14/2015 7  5 - 15 Final  . Prothrombin Time 03/14/2015 15.1* 11.4 - 15.0 seconds Final  . INR 03/14/2015 1.17   Final    Assessment:  TRUDA STAUB is a 79 y.o. female with recurrent stage IV endoemetrial carcinoma.  She underwent TAH/BSO on 07/29/2010.  She was initially felt to have have IIIC disease, but later stage IV disease secondary to likely liver metastasis.  Tumor was ER negative.  She received 11 cycles of carboplatin and Taxol (10/07/2010 until 06/29/2011). She required dose modifications secondary to cytopenias.  She has been followed with serial CA 125s. There has been a steady increase in her CA 125 (1936 on 06/20/2014 and 4186 on 11/21/2014).  She has not been treated by Dr. Inez Pilgrim secondary being asymptomatic. Notes indicate imaging studies revealed slight nodal progression.  Her performance status limits the ability to treat her endometrial cancer.  Abdominal and pelvic CT scan on 11/21/2014  revealed progressive nodal metastatic disease throughout the retroperitoneum. A precaval node measured 4.2 x 3.6 cm (previously 3.8 x 3.5 cm) the left periaortic node measured 3.6 x 2.6 cm (previously 3 x 2.4 cm) There was no evidence of extranodal metastasis in the abdomen or pelvis although imaging was limited by nocontrast. There was a persistent concern of a lingular nodule.   Her course has been complicated by a pulmonary embolism in 08/2010. She is on chronic Coumadin. She has a history of recurrent C. difficile infection and hypogammaglobulinemia.  She has a recent history of UTI sepsis, sacral decubitus, and acute renal insufficiency.   She was admitted to [facility] from 12/12/2014 - 12/17/2014 with an upper GI bleed, Klebsiella UTI with sepsis, decubitus ulcer, and general debilitation.  Initial hemoglobin was 3.  Initial INR was 1.37.  She was transfused with packed red blood cells.  EGD revealed mild gastropathy involving the antrum.    She remains at [facility].  Performance status is poor.  She continues to make no to very little progress.  She denies any abdominal symptoms.  Diet has improved.  INR is 1.17 (sub-therapeutic) on Coumadin 1 mg 5 days/week and 1.5 mg 2 days a week.  Hematocrit is stable.  Plan: 1.  Labs today:  CBC with diff, CMP, CA125, PT/INR, folate, B12, ferritin. 2.  Increase Coumadin to 1.5 mg 4 days a week (Tues, Thurs, Sat, Sun) and 1 mg 3 days a week (MWF). 3.  PT/INR weekly at [facility].  Results faxed to clinic.  Goal INR 2.0. 4.  Nurse to contact center [phone]) weekly for INR adjustment. 5.  No plan for chemotherapy secondary to performance status. 6.  Complete paperwork for [facility]. 7.  RTC in 1 month for MD assessment and labs (CBC with diff, PT/INR).   Lequita Asal, MD  03/14/2015, 4:37 PM

## 2015-03-15 ENCOUNTER — Telehealth: Payer: Self-pay

## 2015-03-15 DIAGNOSIS — E785 Hyperlipidemia, unspecified: Secondary | ICD-10-CM | POA: Diagnosis not present

## 2015-03-15 DIAGNOSIS — C55 Malignant neoplasm of uterus, part unspecified: Secondary | ICD-10-CM | POA: Diagnosis not present

## 2015-03-15 DIAGNOSIS — Z8541 Personal history of malignant neoplasm of cervix uteri: Secondary | ICD-10-CM | POA: Diagnosis not present

## 2015-03-15 DIAGNOSIS — Z8551 Personal history of malignant neoplasm of bladder: Secondary | ICD-10-CM | POA: Diagnosis not present

## 2015-03-15 DIAGNOSIS — K922 Gastrointestinal hemorrhage, unspecified: Secondary | ICD-10-CM | POA: Diagnosis not present

## 2015-03-15 DIAGNOSIS — I10 Essential (primary) hypertension: Secondary | ICD-10-CM | POA: Diagnosis not present

## 2015-03-15 DIAGNOSIS — K219 Gastro-esophageal reflux disease without esophagitis: Secondary | ICD-10-CM | POA: Diagnosis not present

## 2015-03-15 DIAGNOSIS — C541 Malignant neoplasm of endometrium: Secondary | ICD-10-CM | POA: Diagnosis not present

## 2015-03-15 DIAGNOSIS — E1165 Type 2 diabetes mellitus with hyperglycemia: Secondary | ICD-10-CM | POA: Diagnosis not present

## 2015-03-15 DIAGNOSIS — M1990 Unspecified osteoarthritis, unspecified site: Secondary | ICD-10-CM | POA: Diagnosis not present

## 2015-03-15 LAB — VITAMIN B12: Vitamin B-12: 147 pg/mL — ABNORMAL LOW (ref 180–914)

## 2015-03-15 LAB — CA 125: CA 125: 3091 U/mL — ABNORMAL HIGH (ref 0.0–38.1)

## 2015-03-15 NOTE — Telephone Encounter (Signed)
[place] at 1130 asked to speak with RN taking care of pt.  Held for 21 minutes and needed to assist in clinic.  [place] phone up.  [place] back and asked to speak with unit manager at [place] whom answered paged and no answer and stated Mitzi Hansen the unit manager was probably at lunch.  She took message regarding pt and stated she would email Mitzi Hansen and leave a message also for him to get back to me.  Gave both of my numbers for her give to [place]

## 2015-03-16 DIAGNOSIS — K219 Gastro-esophageal reflux disease without esophagitis: Secondary | ICD-10-CM | POA: Diagnosis not present

## 2015-03-16 DIAGNOSIS — Z8541 Personal history of malignant neoplasm of cervix uteri: Secondary | ICD-10-CM | POA: Diagnosis not present

## 2015-03-16 DIAGNOSIS — C55 Malignant neoplasm of uterus, part unspecified: Secondary | ICD-10-CM | POA: Diagnosis not present

## 2015-03-16 DIAGNOSIS — M1990 Unspecified osteoarthritis, unspecified site: Secondary | ICD-10-CM | POA: Diagnosis not present

## 2015-03-16 DIAGNOSIS — I10 Essential (primary) hypertension: Secondary | ICD-10-CM | POA: Diagnosis not present

## 2015-03-16 DIAGNOSIS — E785 Hyperlipidemia, unspecified: Secondary | ICD-10-CM | POA: Diagnosis not present

## 2015-03-16 DIAGNOSIS — E1165 Type 2 diabetes mellitus with hyperglycemia: Secondary | ICD-10-CM | POA: Diagnosis not present

## 2015-03-16 DIAGNOSIS — Z8551 Personal history of malignant neoplasm of bladder: Secondary | ICD-10-CM | POA: Diagnosis not present

## 2015-03-17 DIAGNOSIS — E1165 Type 2 diabetes mellitus with hyperglycemia: Secondary | ICD-10-CM | POA: Diagnosis not present

## 2015-03-17 DIAGNOSIS — M1990 Unspecified osteoarthritis, unspecified site: Secondary | ICD-10-CM | POA: Diagnosis not present

## 2015-03-17 DIAGNOSIS — C55 Malignant neoplasm of uterus, part unspecified: Secondary | ICD-10-CM | POA: Diagnosis not present

## 2015-03-17 DIAGNOSIS — K219 Gastro-esophageal reflux disease without esophagitis: Secondary | ICD-10-CM | POA: Diagnosis not present

## 2015-03-17 DIAGNOSIS — Z8541 Personal history of malignant neoplasm of cervix uteri: Secondary | ICD-10-CM | POA: Diagnosis not present

## 2015-03-17 DIAGNOSIS — E785 Hyperlipidemia, unspecified: Secondary | ICD-10-CM | POA: Diagnosis not present

## 2015-03-17 DIAGNOSIS — I10 Essential (primary) hypertension: Secondary | ICD-10-CM | POA: Diagnosis not present

## 2015-03-17 DIAGNOSIS — Z8551 Personal history of malignant neoplasm of bladder: Secondary | ICD-10-CM | POA: Diagnosis not present

## 2015-03-18 ENCOUNTER — Other Ambulatory Visit: Payer: Self-pay | Admitting: Hematology and Oncology

## 2015-03-18 DIAGNOSIS — I739 Peripheral vascular disease, unspecified: Secondary | ICD-10-CM | POA: Diagnosis not present

## 2015-03-18 DIAGNOSIS — M1990 Unspecified osteoarthritis, unspecified site: Secondary | ICD-10-CM | POA: Diagnosis not present

## 2015-03-18 DIAGNOSIS — L97209 Non-pressure chronic ulcer of unspecified calf with unspecified severity: Secondary | ICD-10-CM | POA: Diagnosis not present

## 2015-03-18 DIAGNOSIS — E1165 Type 2 diabetes mellitus with hyperglycemia: Secondary | ICD-10-CM | POA: Diagnosis not present

## 2015-03-18 DIAGNOSIS — Z8541 Personal history of malignant neoplasm of cervix uteri: Secondary | ICD-10-CM | POA: Diagnosis not present

## 2015-03-18 DIAGNOSIS — I83209 Varicose veins of unspecified lower extremity with both ulcer of unspecified site and inflammation: Secondary | ICD-10-CM | POA: Diagnosis not present

## 2015-03-18 DIAGNOSIS — E538 Deficiency of other specified B group vitamins: Secondary | ICD-10-CM | POA: Insufficient documentation

## 2015-03-18 DIAGNOSIS — Z8551 Personal history of malignant neoplasm of bladder: Secondary | ICD-10-CM | POA: Diagnosis not present

## 2015-03-18 DIAGNOSIS — I10 Essential (primary) hypertension: Secondary | ICD-10-CM | POA: Diagnosis not present

## 2015-03-18 DIAGNOSIS — C55 Malignant neoplasm of uterus, part unspecified: Secondary | ICD-10-CM | POA: Diagnosis not present

## 2015-03-18 DIAGNOSIS — K219 Gastro-esophageal reflux disease without esophagitis: Secondary | ICD-10-CM | POA: Diagnosis not present

## 2015-03-18 DIAGNOSIS — I890 Lymphedema, not elsewhere classified: Secondary | ICD-10-CM | POA: Diagnosis not present

## 2015-03-18 DIAGNOSIS — E785 Hyperlipidemia, unspecified: Secondary | ICD-10-CM | POA: Diagnosis not present

## 2015-03-18 DIAGNOSIS — L97309 Non-pressure chronic ulcer of unspecified ankle with unspecified severity: Secondary | ICD-10-CM | POA: Diagnosis not present

## 2015-03-19 ENCOUNTER — Other Ambulatory Visit: Payer: Self-pay | Admitting: Vascular Surgery

## 2015-03-19 DIAGNOSIS — I10 Essential (primary) hypertension: Secondary | ICD-10-CM | POA: Diagnosis not present

## 2015-03-19 DIAGNOSIS — C55 Malignant neoplasm of uterus, part unspecified: Secondary | ICD-10-CM | POA: Diagnosis not present

## 2015-03-19 DIAGNOSIS — E1165 Type 2 diabetes mellitus with hyperglycemia: Secondary | ICD-10-CM | POA: Diagnosis not present

## 2015-03-19 DIAGNOSIS — M1990 Unspecified osteoarthritis, unspecified site: Secondary | ICD-10-CM | POA: Diagnosis not present

## 2015-03-19 DIAGNOSIS — E785 Hyperlipidemia, unspecified: Secondary | ICD-10-CM | POA: Diagnosis not present

## 2015-03-19 DIAGNOSIS — Z8541 Personal history of malignant neoplasm of cervix uteri: Secondary | ICD-10-CM | POA: Diagnosis not present

## 2015-03-19 DIAGNOSIS — K219 Gastro-esophageal reflux disease without esophagitis: Secondary | ICD-10-CM | POA: Diagnosis not present

## 2015-03-19 DIAGNOSIS — Z8551 Personal history of malignant neoplasm of bladder: Secondary | ICD-10-CM | POA: Diagnosis not present

## 2015-03-20 DIAGNOSIS — K219 Gastro-esophageal reflux disease without esophagitis: Secondary | ICD-10-CM | POA: Diagnosis not present

## 2015-03-20 DIAGNOSIS — E1165 Type 2 diabetes mellitus with hyperglycemia: Secondary | ICD-10-CM | POA: Diagnosis not present

## 2015-03-20 DIAGNOSIS — E785 Hyperlipidemia, unspecified: Secondary | ICD-10-CM | POA: Diagnosis not present

## 2015-03-20 DIAGNOSIS — C55 Malignant neoplasm of uterus, part unspecified: Secondary | ICD-10-CM | POA: Diagnosis not present

## 2015-03-20 DIAGNOSIS — Z8551 Personal history of malignant neoplasm of bladder: Secondary | ICD-10-CM | POA: Diagnosis not present

## 2015-03-20 DIAGNOSIS — M1990 Unspecified osteoarthritis, unspecified site: Secondary | ICD-10-CM | POA: Diagnosis not present

## 2015-03-20 DIAGNOSIS — Z8541 Personal history of malignant neoplasm of cervix uteri: Secondary | ICD-10-CM | POA: Diagnosis not present

## 2015-03-20 DIAGNOSIS — I10 Essential (primary) hypertension: Secondary | ICD-10-CM | POA: Diagnosis not present

## 2015-03-21 ENCOUNTER — Telehealth: Payer: Self-pay

## 2015-03-21 ENCOUNTER — Inpatient Hospital Stay

## 2015-03-21 DIAGNOSIS — E538 Deficiency of other specified B group vitamins: Secondary | ICD-10-CM | POA: Diagnosis not present

## 2015-03-21 DIAGNOSIS — R5383 Other fatigue: Secondary | ICD-10-CM | POA: Diagnosis not present

## 2015-03-21 DIAGNOSIS — Z87440 Personal history of urinary (tract) infections: Secondary | ICD-10-CM | POA: Diagnosis not present

## 2015-03-21 DIAGNOSIS — K219 Gastro-esophageal reflux disease without esophagitis: Secondary | ICD-10-CM | POA: Diagnosis not present

## 2015-03-21 DIAGNOSIS — I10 Essential (primary) hypertension: Secondary | ICD-10-CM | POA: Diagnosis not present

## 2015-03-21 DIAGNOSIS — L89301 Pressure ulcer of unspecified buttock, stage 1: Secondary | ICD-10-CM | POA: Diagnosis not present

## 2015-03-21 DIAGNOSIS — C55 Malignant neoplasm of uterus, part unspecified: Secondary | ICD-10-CM | POA: Diagnosis not present

## 2015-03-21 DIAGNOSIS — Z8541 Personal history of malignant neoplasm of cervix uteri: Secondary | ICD-10-CM | POA: Diagnosis not present

## 2015-03-21 DIAGNOSIS — M129 Arthropathy, unspecified: Secondary | ICD-10-CM | POA: Diagnosis not present

## 2015-03-21 DIAGNOSIS — Z86711 Personal history of pulmonary embolism: Secondary | ICD-10-CM | POA: Diagnosis not present

## 2015-03-21 DIAGNOSIS — Z8551 Personal history of malignant neoplasm of bladder: Secondary | ICD-10-CM | POA: Diagnosis not present

## 2015-03-21 DIAGNOSIS — C541 Malignant neoplasm of endometrium: Secondary | ICD-10-CM | POA: Diagnosis not present

## 2015-03-21 DIAGNOSIS — Z8619 Personal history of other infectious and parasitic diseases: Secondary | ICD-10-CM | POA: Diagnosis not present

## 2015-03-21 DIAGNOSIS — E119 Type 2 diabetes mellitus without complications: Secondary | ICD-10-CM | POA: Diagnosis not present

## 2015-03-21 DIAGNOSIS — E7800 Pure hypercholesterolemia, unspecified: Secondary | ICD-10-CM | POA: Diagnosis not present

## 2015-03-21 DIAGNOSIS — R32 Unspecified urinary incontinence: Secondary | ICD-10-CM | POA: Diagnosis not present

## 2015-03-21 DIAGNOSIS — I739 Peripheral vascular disease, unspecified: Secondary | ICD-10-CM | POA: Diagnosis not present

## 2015-03-21 DIAGNOSIS — D801 Nonfamilial hypogammaglobulinemia: Secondary | ICD-10-CM | POA: Diagnosis not present

## 2015-03-21 DIAGNOSIS — E785 Hyperlipidemia, unspecified: Secondary | ICD-10-CM | POA: Diagnosis not present

## 2015-03-21 DIAGNOSIS — Z79899 Other long term (current) drug therapy: Secondary | ICD-10-CM | POA: Diagnosis not present

## 2015-03-21 DIAGNOSIS — Z794 Long term (current) use of insulin: Secondary | ICD-10-CM | POA: Diagnosis not present

## 2015-03-21 DIAGNOSIS — C772 Secondary and unspecified malignant neoplasm of intra-abdominal lymph nodes: Secondary | ICD-10-CM | POA: Diagnosis not present

## 2015-03-21 DIAGNOSIS — M1990 Unspecified osteoarthritis, unspecified site: Secondary | ICD-10-CM | POA: Diagnosis not present

## 2015-03-21 DIAGNOSIS — E1165 Type 2 diabetes mellitus with hyperglycemia: Secondary | ICD-10-CM | POA: Diagnosis not present

## 2015-03-21 MED ORDER — CYANOCOBALAMIN 1000 MCG/ML IJ SOLN
1000.0000 ug | Freq: Once | INTRAMUSCULAR | Status: AC
  Administered 2015-03-21: 1000 ug via INTRAMUSCULAR
  Filled 2015-03-21: qty 1

## 2015-03-21 NOTE — Telephone Encounter (Signed)
Called Rosa pt's daughter regarding hospice.  I spoke with with pt and pt's sister today when she came in for B-12 injection.  Per sister they need to make a decision related to having hospice.  I explained to sister that pt needs to be part pt's decision at this point if capable and pt does not seem to completely understand.  Spoke with MD and MD asked for pt and family to please be seen in clinic soon as possible to discuss possible outcomes and poc.  Pt's sister is having difficulty understanding diagnosis and possible outcomes.  When speaking with daughter on phone today she stated from her understanding from her aunt, that her mother was looking better and that hospice was concerned about an upcoming procedure from vein and vascular related to placing stents and being invasive.  Daughter will be calling vein and vascular to seeing if postponing appt is safe for another week until they reassess mothers diagnosis, poc, and outcomes in our office on 10/20.  Daughter verbalizes that she just wants to move forward with what is best for her mother.  If taking her off hospice so vein and vascular can help the pain in her legs is needed she may have to but would like to know where she stands with cancer and possible treatments and such.

## 2015-03-22 ENCOUNTER — Telehealth: Payer: Self-pay

## 2015-03-22 DIAGNOSIS — I10 Essential (primary) hypertension: Secondary | ICD-10-CM | POA: Diagnosis not present

## 2015-03-22 DIAGNOSIS — M1990 Unspecified osteoarthritis, unspecified site: Secondary | ICD-10-CM | POA: Diagnosis not present

## 2015-03-22 DIAGNOSIS — Z8551 Personal history of malignant neoplasm of bladder: Secondary | ICD-10-CM | POA: Diagnosis not present

## 2015-03-22 DIAGNOSIS — K219 Gastro-esophageal reflux disease without esophagitis: Secondary | ICD-10-CM | POA: Diagnosis not present

## 2015-03-22 DIAGNOSIS — Z8541 Personal history of malignant neoplasm of cervix uteri: Secondary | ICD-10-CM | POA: Diagnosis not present

## 2015-03-22 DIAGNOSIS — C55 Malignant neoplasm of uterus, part unspecified: Secondary | ICD-10-CM | POA: Diagnosis not present

## 2015-03-22 DIAGNOSIS — E785 Hyperlipidemia, unspecified: Secondary | ICD-10-CM | POA: Diagnosis not present

## 2015-03-22 DIAGNOSIS — E1165 Type 2 diabetes mellitus with hyperglycemia: Secondary | ICD-10-CM | POA: Diagnosis not present

## 2015-03-22 NOTE — Telephone Encounter (Signed)
[facility] x3 today.  Once at 0830 held for 8 min.  Had to hang up.  Called again shortly after lunch spoke with an RN that reported she was not the pt's RN but would get me PT/INR results than stated they were not documented where they normally are and she knew they were having trouble with their machine and would need to call me back.  Gave RN all my numbers and no return call as of 1500.  Calling back again for a third time call began at 1448, held 14 minutes to be placed on hold again by Sonia Baller who does not have results.  I asked to speak with another manager and she again told me I would need to have them call you back.  I gave her my numbers again for management to call me back. Spoke with unit manager Mitzi Hansen on 10/7 who insured me that labs would be drawn and he would have nurses fax results to which have not been done.

## 2015-03-23 DIAGNOSIS — Z8551 Personal history of malignant neoplasm of bladder: Secondary | ICD-10-CM | POA: Diagnosis not present

## 2015-03-23 DIAGNOSIS — K219 Gastro-esophageal reflux disease without esophagitis: Secondary | ICD-10-CM | POA: Diagnosis not present

## 2015-03-23 DIAGNOSIS — C55 Malignant neoplasm of uterus, part unspecified: Secondary | ICD-10-CM | POA: Diagnosis not present

## 2015-03-23 DIAGNOSIS — Z8541 Personal history of malignant neoplasm of cervix uteri: Secondary | ICD-10-CM | POA: Diagnosis not present

## 2015-03-23 DIAGNOSIS — E785 Hyperlipidemia, unspecified: Secondary | ICD-10-CM | POA: Diagnosis not present

## 2015-03-23 DIAGNOSIS — E1165 Type 2 diabetes mellitus with hyperglycemia: Secondary | ICD-10-CM | POA: Diagnosis not present

## 2015-03-23 DIAGNOSIS — I10 Essential (primary) hypertension: Secondary | ICD-10-CM | POA: Diagnosis not present

## 2015-03-23 DIAGNOSIS — M1990 Unspecified osteoarthritis, unspecified site: Secondary | ICD-10-CM | POA: Diagnosis not present

## 2015-03-24 DIAGNOSIS — I10 Essential (primary) hypertension: Secondary | ICD-10-CM | POA: Diagnosis not present

## 2015-03-24 DIAGNOSIS — E1165 Type 2 diabetes mellitus with hyperglycemia: Secondary | ICD-10-CM | POA: Diagnosis not present

## 2015-03-24 DIAGNOSIS — M1990 Unspecified osteoarthritis, unspecified site: Secondary | ICD-10-CM | POA: Diagnosis not present

## 2015-03-24 DIAGNOSIS — Z8551 Personal history of malignant neoplasm of bladder: Secondary | ICD-10-CM | POA: Diagnosis not present

## 2015-03-24 DIAGNOSIS — C55 Malignant neoplasm of uterus, part unspecified: Secondary | ICD-10-CM | POA: Diagnosis not present

## 2015-03-24 DIAGNOSIS — Z8541 Personal history of malignant neoplasm of cervix uteri: Secondary | ICD-10-CM | POA: Diagnosis not present

## 2015-03-24 DIAGNOSIS — K219 Gastro-esophageal reflux disease without esophagitis: Secondary | ICD-10-CM | POA: Diagnosis not present

## 2015-03-24 DIAGNOSIS — E785 Hyperlipidemia, unspecified: Secondary | ICD-10-CM | POA: Diagnosis not present

## 2015-03-25 ENCOUNTER — Telehealth: Payer: Self-pay

## 2015-03-25 ENCOUNTER — Inpatient Hospital Stay

## 2015-03-25 DIAGNOSIS — C55 Malignant neoplasm of uterus, part unspecified: Secondary | ICD-10-CM | POA: Diagnosis not present

## 2015-03-25 DIAGNOSIS — D801 Nonfamilial hypogammaglobulinemia: Secondary | ICD-10-CM | POA: Diagnosis not present

## 2015-03-25 DIAGNOSIS — I739 Peripheral vascular disease, unspecified: Secondary | ICD-10-CM | POA: Diagnosis not present

## 2015-03-25 DIAGNOSIS — Z8551 Personal history of malignant neoplasm of bladder: Secondary | ICD-10-CM | POA: Diagnosis not present

## 2015-03-25 DIAGNOSIS — Z794 Long term (current) use of insulin: Secondary | ICD-10-CM | POA: Diagnosis not present

## 2015-03-25 DIAGNOSIS — Z8619 Personal history of other infectious and parasitic diseases: Secondary | ICD-10-CM | POA: Diagnosis not present

## 2015-03-25 DIAGNOSIS — C772 Secondary and unspecified malignant neoplasm of intra-abdominal lymph nodes: Secondary | ICD-10-CM | POA: Diagnosis not present

## 2015-03-25 DIAGNOSIS — M1990 Unspecified osteoarthritis, unspecified site: Secondary | ICD-10-CM | POA: Diagnosis not present

## 2015-03-25 DIAGNOSIS — Z8541 Personal history of malignant neoplasm of cervix uteri: Secondary | ICD-10-CM | POA: Diagnosis not present

## 2015-03-25 DIAGNOSIS — L89301 Pressure ulcer of unspecified buttock, stage 1: Secondary | ICD-10-CM | POA: Diagnosis not present

## 2015-03-25 DIAGNOSIS — Z5181 Encounter for therapeutic drug level monitoring: Secondary | ICD-10-CM | POA: Diagnosis not present

## 2015-03-25 DIAGNOSIS — Z79899 Other long term (current) drug therapy: Secondary | ICD-10-CM | POA: Diagnosis not present

## 2015-03-25 DIAGNOSIS — E7800 Pure hypercholesterolemia, unspecified: Secondary | ICD-10-CM | POA: Diagnosis not present

## 2015-03-25 DIAGNOSIS — C541 Malignant neoplasm of endometrium: Secondary | ICD-10-CM | POA: Diagnosis not present

## 2015-03-25 DIAGNOSIS — E1165 Type 2 diabetes mellitus with hyperglycemia: Secondary | ICD-10-CM | POA: Diagnosis not present

## 2015-03-25 DIAGNOSIS — E119 Type 2 diabetes mellitus without complications: Secondary | ICD-10-CM | POA: Diagnosis not present

## 2015-03-25 DIAGNOSIS — Z86711 Personal history of pulmonary embolism: Secondary | ICD-10-CM | POA: Diagnosis not present

## 2015-03-25 DIAGNOSIS — R5383 Other fatigue: Secondary | ICD-10-CM | POA: Diagnosis not present

## 2015-03-25 DIAGNOSIS — E538 Deficiency of other specified B group vitamins: Secondary | ICD-10-CM | POA: Diagnosis not present

## 2015-03-25 DIAGNOSIS — M129 Arthropathy, unspecified: Secondary | ICD-10-CM | POA: Diagnosis not present

## 2015-03-25 DIAGNOSIS — I82409 Acute embolism and thrombosis of unspecified deep veins of unspecified lower extremity: Secondary | ICD-10-CM

## 2015-03-25 DIAGNOSIS — K219 Gastro-esophageal reflux disease without esophagitis: Secondary | ICD-10-CM | POA: Diagnosis not present

## 2015-03-25 DIAGNOSIS — E785 Hyperlipidemia, unspecified: Secondary | ICD-10-CM | POA: Diagnosis not present

## 2015-03-25 DIAGNOSIS — R32 Unspecified urinary incontinence: Secondary | ICD-10-CM | POA: Diagnosis not present

## 2015-03-25 DIAGNOSIS — I10 Essential (primary) hypertension: Secondary | ICD-10-CM | POA: Diagnosis not present

## 2015-03-25 DIAGNOSIS — Z87440 Personal history of urinary (tract) infections: Secondary | ICD-10-CM | POA: Diagnosis not present

## 2015-03-25 LAB — PROTIME-INR
INR: 1.38
Prothrombin Time: 17.2 seconds — ABNORMAL HIGH (ref 11.4–15.0)

## 2015-03-25 NOTE — Telephone Encounter (Signed)
[facility] to report fax was faxed to them with new medication.  Spoke with Oris Drone, RN at [facility] after 20 minutes on hold.  Confirmed faxed sent.  Read back new orders of take coumadin 1.5mg  Mon, Wed, Thurs, Sat, Sun and take 1mg  on Friday's.

## 2015-03-26 DIAGNOSIS — E1165 Type 2 diabetes mellitus with hyperglycemia: Secondary | ICD-10-CM | POA: Diagnosis not present

## 2015-03-26 DIAGNOSIS — I10 Essential (primary) hypertension: Secondary | ICD-10-CM | POA: Diagnosis not present

## 2015-03-26 DIAGNOSIS — Z8551 Personal history of malignant neoplasm of bladder: Secondary | ICD-10-CM | POA: Diagnosis not present

## 2015-03-26 DIAGNOSIS — M1990 Unspecified osteoarthritis, unspecified site: Secondary | ICD-10-CM | POA: Diagnosis not present

## 2015-03-26 DIAGNOSIS — E785 Hyperlipidemia, unspecified: Secondary | ICD-10-CM | POA: Diagnosis not present

## 2015-03-26 DIAGNOSIS — Z8541 Personal history of malignant neoplasm of cervix uteri: Secondary | ICD-10-CM | POA: Diagnosis not present

## 2015-03-26 DIAGNOSIS — K219 Gastro-esophageal reflux disease without esophagitis: Secondary | ICD-10-CM | POA: Diagnosis not present

## 2015-03-26 DIAGNOSIS — C55 Malignant neoplasm of uterus, part unspecified: Secondary | ICD-10-CM | POA: Diagnosis not present

## 2015-03-27 ENCOUNTER — Encounter: Admission: RE | Payer: Self-pay | Source: Ambulatory Visit

## 2015-03-27 ENCOUNTER — Ambulatory Visit: Admission: RE | Admit: 2015-03-27 | Payer: Medicare Other | Source: Ambulatory Visit | Admitting: Vascular Surgery

## 2015-03-27 DIAGNOSIS — Z8551 Personal history of malignant neoplasm of bladder: Secondary | ICD-10-CM | POA: Diagnosis not present

## 2015-03-27 DIAGNOSIS — C55 Malignant neoplasm of uterus, part unspecified: Secondary | ICD-10-CM | POA: Diagnosis not present

## 2015-03-27 DIAGNOSIS — E785 Hyperlipidemia, unspecified: Secondary | ICD-10-CM | POA: Diagnosis not present

## 2015-03-27 DIAGNOSIS — I4891 Unspecified atrial fibrillation: Secondary | ICD-10-CM | POA: Diagnosis not present

## 2015-03-27 DIAGNOSIS — Z8541 Personal history of malignant neoplasm of cervix uteri: Secondary | ICD-10-CM | POA: Diagnosis not present

## 2015-03-27 DIAGNOSIS — L89150 Pressure ulcer of sacral region, unstageable: Secondary | ICD-10-CM | POA: Diagnosis not present

## 2015-03-27 DIAGNOSIS — K219 Gastro-esophageal reflux disease without esophagitis: Secondary | ICD-10-CM | POA: Diagnosis not present

## 2015-03-27 DIAGNOSIS — M1990 Unspecified osteoarthritis, unspecified site: Secondary | ICD-10-CM | POA: Diagnosis not present

## 2015-03-27 DIAGNOSIS — E1165 Type 2 diabetes mellitus with hyperglycemia: Secondary | ICD-10-CM | POA: Diagnosis not present

## 2015-03-27 DIAGNOSIS — I119 Hypertensive heart disease without heart failure: Secondary | ICD-10-CM | POA: Diagnosis not present

## 2015-03-27 DIAGNOSIS — I5032 Chronic diastolic (congestive) heart failure: Secondary | ICD-10-CM | POA: Diagnosis not present

## 2015-03-27 DIAGNOSIS — I10 Essential (primary) hypertension: Secondary | ICD-10-CM | POA: Diagnosis not present

## 2015-03-27 SURGERY — LOWER EXTREMITY ANGIOGRAPHY
Anesthesia: Moderate Sedation | Laterality: Right

## 2015-03-28 ENCOUNTER — Inpatient Hospital Stay (HOSPITAL_BASED_OUTPATIENT_CLINIC_OR_DEPARTMENT_OTHER): Admitting: Hematology and Oncology

## 2015-03-28 ENCOUNTER — Inpatient Hospital Stay

## 2015-03-28 VITALS — BP 169/71 | HR 74 | Temp 98.0°F

## 2015-03-28 DIAGNOSIS — I10 Essential (primary) hypertension: Secondary | ICD-10-CM | POA: Diagnosis not present

## 2015-03-28 DIAGNOSIS — Z79899 Other long term (current) drug therapy: Secondary | ICD-10-CM | POA: Diagnosis not present

## 2015-03-28 DIAGNOSIS — C772 Secondary and unspecified malignant neoplasm of intra-abdominal lymph nodes: Secondary | ICD-10-CM

## 2015-03-28 DIAGNOSIS — M129 Arthropathy, unspecified: Secondary | ICD-10-CM | POA: Diagnosis not present

## 2015-03-28 DIAGNOSIS — E538 Deficiency of other specified B group vitamins: Secondary | ICD-10-CM | POA: Diagnosis not present

## 2015-03-28 DIAGNOSIS — E7800 Pure hypercholesterolemia, unspecified: Secondary | ICD-10-CM | POA: Diagnosis not present

## 2015-03-28 DIAGNOSIS — L89301 Pressure ulcer of unspecified buttock, stage 1: Secondary | ICD-10-CM | POA: Diagnosis not present

## 2015-03-28 DIAGNOSIS — C549 Malignant neoplasm of corpus uteri, unspecified: Secondary | ICD-10-CM

## 2015-03-28 DIAGNOSIS — I739 Peripheral vascular disease, unspecified: Secondary | ICD-10-CM

## 2015-03-28 DIAGNOSIS — R5383 Other fatigue: Secondary | ICD-10-CM | POA: Diagnosis not present

## 2015-03-28 DIAGNOSIS — C541 Malignant neoplasm of endometrium: Secondary | ICD-10-CM | POA: Diagnosis not present

## 2015-03-28 DIAGNOSIS — Z7901 Long term (current) use of anticoagulants: Secondary | ICD-10-CM

## 2015-03-28 DIAGNOSIS — Z8541 Personal history of malignant neoplasm of cervix uteri: Secondary | ICD-10-CM

## 2015-03-28 DIAGNOSIS — Z8619 Personal history of other infectious and parasitic diseases: Secondary | ICD-10-CM | POA: Diagnosis not present

## 2015-03-28 DIAGNOSIS — Z794 Long term (current) use of insulin: Secondary | ICD-10-CM

## 2015-03-28 DIAGNOSIS — Z86711 Personal history of pulmonary embolism: Secondary | ICD-10-CM | POA: Diagnosis not present

## 2015-03-28 DIAGNOSIS — Z87440 Personal history of urinary (tract) infections: Secondary | ICD-10-CM

## 2015-03-28 DIAGNOSIS — R32 Unspecified urinary incontinence: Secondary | ICD-10-CM | POA: Diagnosis not present

## 2015-03-28 DIAGNOSIS — C55 Malignant neoplasm of uterus, part unspecified: Secondary | ICD-10-CM | POA: Diagnosis not present

## 2015-03-28 DIAGNOSIS — Z5181 Encounter for therapeutic drug level monitoring: Secondary | ICD-10-CM | POA: Diagnosis not present

## 2015-03-28 DIAGNOSIS — E119 Type 2 diabetes mellitus without complications: Secondary | ICD-10-CM

## 2015-03-28 DIAGNOSIS — K219 Gastro-esophageal reflux disease without esophagitis: Secondary | ICD-10-CM | POA: Diagnosis not present

## 2015-03-28 DIAGNOSIS — Z8551 Personal history of malignant neoplasm of bladder: Secondary | ICD-10-CM | POA: Diagnosis not present

## 2015-03-28 DIAGNOSIS — E785 Hyperlipidemia, unspecified: Secondary | ICD-10-CM | POA: Diagnosis not present

## 2015-03-28 DIAGNOSIS — D801 Nonfamilial hypogammaglobulinemia: Secondary | ICD-10-CM | POA: Diagnosis not present

## 2015-03-28 DIAGNOSIS — E1165 Type 2 diabetes mellitus with hyperglycemia: Secondary | ICD-10-CM | POA: Diagnosis not present

## 2015-03-28 DIAGNOSIS — M1990 Unspecified osteoarthritis, unspecified site: Secondary | ICD-10-CM | POA: Diagnosis not present

## 2015-03-28 MED ORDER — CYANOCOBALAMIN 1000 MCG/ML IJ SOLN
1000.0000 ug | Freq: Once | INTRAMUSCULAR | Status: AC
  Administered 2015-03-28: 1000 ug via INTRAMUSCULAR
  Filled 2015-03-28: qty 1

## 2015-03-28 NOTE — Progress Notes (Signed)
[facility] day:  03/28/2015    Chief Complaint: Shelley Fields is a 79 y.o. female with a stage IV endometrial carcinoma and a history of pulmonary embolism on Coumadin who is seen for assessment and discussion regarding direction of therapy.  HPI: The patient was last seen in the medical oncology clinic on 03/14/2015.  At that time,  her performance status remained poor. She continue to make her a little progress at [facility]. INR was subtherapeutic at 1.17 with Coumadin 5 days a week and 1.5 mg 2 days a week. Hematocrit was stable. Coumadin was to increase to 1.5 mg 4 days a week and 1 mg 3 days a week.  INR was to be checked weekly with adjustment in her Coumadin.  During the interim her INR has not been checked.  INR was subsequently drawn in clinic on 03/25/2015 and was 1.38.  Coumadin was adjusted to 1.5 mg 6 days a week and 1 mg on Fridays.  She denies any bruising or bleeding.  She notes pain in her legs due to peripheral artery disease. She wishes to evaluated and have this problem corrected as it is affecting her quality of life. She has been on Hospice since 01/06/2015.  Past Medical History  Diagnosis Date  . Diabetes mellitus without complication ([place])   . C. difficile diarrhea   . Uterine cancer ([place])   . Hypogammaglobulinemia ([place])   . Pulmonary embolism ([place])   . Decubitus ulcers   . Hypertension   . Hypercholesterolemia   . Incontinence of urine   . Cancer [facility])     Endometrial Cancer  . Cervical cancer ([place])     with radiation 45 years ago...  . Arthritis   . GERD (gastroesophageal reflux disease)   . Bladder cancer ([place])   . Decubitus ulcer of buttock, stage 1     Past Surgical History  Procedure Laterality Date  . Total abdominal hysterectomy w/ bilateral salpingoophorectomy  07/29/2010  . Stent in leg  2016  . Cataract extraction, bilateral    . Bladder surgery    . Femoral endarterectomy      right   . Leg surgery      right leg  . Portacath placement      right  . Esophagogastroduodenoscopy N/A 12/14/2014    Procedure: ESOPHAGOGASTRODUODENOSCOPY (EGD);  Surgeon: Hulen Luster, MD;  Location: [facility] ENDOSCOPY;  Service: Endoscopy;  Laterality: N/A;    Family History  Problem Relation Age of Onset  . Hypertension Mother   . Diabetes Mellitus II Mother   . Coronary artery disease Mother   . Hypertension Father   . Diabetes Mellitus II Father   . CAD Father     Social History:  reports that she has never smoked. She does not have any smokeless tobacco history on file. She reports that she does not drink alcohol or use illicit drugs.  The patient is accompanied by her sister, Shelley Fields.  Her daughter, Shelley Fields lives in [name].  Her sister can not care for her.  Her daughter's cell number is [phone].  Her daughter was available via long-distance during the clinic appointment  Allergies:  Allergies  Allergen Reactions  . Contrast Media [Iodinated Diagnostic Agents]     Current Medications: Current Outpatient Prescriptions  Medication Sig Dispense Refill  . amLODipine (NORVASC) 5 MG tablet TAKE 1 TABLET BY MOUTH EVERY DAY 90 tablet 3  . atenolol (TENORMIN) 100 MG tablet Take  1 tablet by mouth daily.  3  . atenolol (TENORMIN) 50 MG tablet Take 50 mg by mouth daily.    . benazepril (LOTENSIN) 40 MG tablet Take 40 mg by mouth daily.    . cloNIDine (CATAPRES) 0.2 MG tablet Take 0.2 mg by mouth 2 (two) times daily.    . furosemide (LASIX) 20 MG tablet Take 20 mg by mouth every other day.    Marland Kitchen HUMULIN 70/30 (70-30) 100 UNIT/ML injection 16 Units daily at 6 PM.    . HUMULIN 70/30 KWIKPEN (70-30) 100 UNIT/ML PEN     . hydrALAZINE (APRESOLINE) 100 MG tablet Take 100 mg by mouth 2 (two) times daily.    Marland Kitchen lovastatin (MEVACOR) 20 MG tablet TAKE 1 TABLET BY MOUTH AT BEDTIME 30 tablet 0  . meclizine (ANTIVERT) 25 MG tablet Take 25 mg by mouth daily.    . megestrol (MEGACE ES) 625 MG/5ML  suspension Take 1.6 mLs (200 mg total) by mouth daily. to stimulate appetite 150 mL 0  . mirtazapine (REMERON) 15 MG tablet Take 15 mg by mouth daily.    . mupirocin ointment (BACTROBAN) 2 %     . NOVOLOG FLEXPEN 100 UNIT/ML FlexPen Per Sliding Scale...    . nystatin (MYCOSTATIN) powder     . omeprazole (PRILOSEC) 20 MG capsule Take 20 mg by mouth daily.  12  . potassium chloride (K-DUR) 10 MEQ tablet Take 1 tablet by mouth daily.  0  . promethazine (PHENERGAN) 25 MG tablet Take 25 mg by mouth every 8 (eight) hours as needed for nausea or vomiting.    . tamsulosin (FLOMAX) 0.4 MG CAPS capsule Take 0.4 mg by mouth 2 (two) times daily.    Marland Kitchen warfarin (COUMADIN) 1 MG tablet Take 1 mg by mouth daily.    Marland Kitchen SANTYL ointment      No current facility-administered medications for this visit.   Facility-Administered Medications Ordered in Other Visits  Medication Dose Route Frequency Provider Last Rate Last Dose  . sodium chloride 0.9 % injection 10 mL  10 mL Intravenous PRN Evlyn Kanner, NP   10 mL at 11/02/14 7628    Review of Systems:  GENERAL:  General fatigue. Minimal activity (stable).  No fevers, sweats.  Weight unable to be obtained. PERFORMANCE STATUS (ECOG):  3-4 HEENT:  No visual changes, runny nose, sore throat, mouth sores or tenderness. Lungs: No shortness of breath or cough.  No hemoptysis. Cardiac:  No chest pain, palpitations, orthopnea, or PND. GI:  No nausea, vomiting, diarrhea, constipation, melena or hematochezia. GU:  Foley.  No urgency, frequency, dysuria, or hematuria. Musculoskeletal:  No back pain.  No joint pain.  No muscle tenderness. Extremities:  Pain in legs from peripheral vascular disease. Skin:  Sacral decubitus ulcer, healed.  Neuro:  General weakness.  No headache, numbness or weakness, balance or coordination issues. Endocrine:  Diabetes.  No thyroid issues, hot flashes or night sweats. Psych:  No mood changes, depression or anxiety. Pain:  No focal  pain. Review of systems:  All other systems reviewed and found to be negative.  Physical Exam: Blood pressure 169/71, pulse 74, temperature 98 F (36.7 C), temperature source Oral. GENERAL:  Chronically ill appearing woman sitting comfortably in a wheelchair in no acute distress. MENTAL STATUS:  Alert and oriented to person, place and time. HEAD: Pearline Cables thin hair.  Normocephalic, atraumatic, face symmetric, no Cushingoid features.   EYES:  Pupils equal round and reactive to light and accommodation.  No conjunctivitis  or scleral icterus. ENT:  Oropharynx clear without lesion.  No thrush.  RESPIRATORY:  Poor respiratory excursion.  Clear to auscultation without rales, wheezes or rhonchi. CARDIOVASCULAR:  Regular rate and rhythm without murmur, rub or gallop. ABDOMEN:  Soft, non-tender, with active bowel sounds, and no hepatosplenomegaly.  No masses. SKIN:  Left cheek basal cell carcinoma.   EXTREMITIES: Left leg edema.  Tender right leg from knee down.  No skin discoloration or tenderness.  No palpable cords. NEUROLOGICAL: Unremarkable. PSYCH:  Appropriate.  No visits with results within 3 Day(s) from this visit. Latest known visit with results is:  Appointment on 03/25/2015  Component Date Value Ref Range Status  . Prothrombin Time 03/25/2015 17.2* 11.4 - 15.0 seconds Final  . INR 03/25/2015 1.38   Final    Assessment:  QUANASIA DEFINO is a 79 y.o. female with recurrent stage IV endoemetrial carcinoma.  She underwent TAH/BSO on 07/29/2010.  She was initially felt to have have IIIC disease, but later stage IV disease secondary to likely liver metastasis.  Tumor was ER negative.  She received 11 cycles of carboplatin and Taxol (10/07/2010 until 06/29/2011). She required dose modifications secondary to cytopenias.  She has been followed with serial CA 125s. There has been a steady increase in her CA 125 (1936 on 06/20/2014 and 4186 on 11/21/2014). She has not been treated by Dr. Inez Pilgrim  secondary being asymptomatic. Notes indicate imaging studies revealed slight nodal progression.  Her performance status limits the ability to treat her endometrial cancer.  Abdominal and pelvic CT scan on 11/21/2014 revealed progressive nodal metastatic disease throughout the retroperitoneum. A precaval node measured 4.2 x 3.6 cm (previously 3.8 x 3.5 cm) the left periaortic node measured 3.6 x 2.6 cm (previously 3 x 2.4 cm) There was no evidence of extranodal metastasis in the abdomen or pelvis although imaging was limited by nocontrast. There was a persistent concern of a lingular nodule.   Her course has been complicated by a pulmonary embolism in 08/2010. She is on chronic Coumadin. She has a history of recurrent C. difficile infection and hypogammaglobulinemia.  She has a recent history of UTI sepsis, sacral decubitus, and acute renal insufficiency.   She was admitted to [facility] from 12/12/2014 - 12/17/2014 with an upper GI bleed, Klebsiella UTI with sepsis, decubitus ulcer, and general debilitation.  Initial hemoglobin was 3.  Initial INR was 1.37.  She was transfused with packed red blood cells.  EGD revealed mild gastropathy involving the antrum.    She is B12 deficient (147 on 03/14/2015).  She began weekly B12 x 6 on 03/21/2015.  She remains at [name].  Performance status is poor.  She has made very little progress.  She has lower extremity pain felt secondary to peripheral vascular disease.  INR is 1.38 (sub-therapeutic) on Coumadin 1.5 mg 6 days/week and 1 mg on Fridays.  Hematocrit is stable.  Plan: 1.  Review interim labs.  2.  Continue current dose of Coumadin. 3.  Check INR weekly in clinic.  Adjust Coumadin weekly. 4.  Social work consult. 5.  Discuss no plan for chemotherapy secondary to performance status. 6.  Follow-up with vascular surgery. 7.  B12 today then weekly x 4 (10/31, 11/07, 11/14, and 11/21) then monthly. 8.  Complete paperwork for [facility]. 9.  RTC in 1 month for MD assessment and labs (PT/INR).   Lequita Asal, MD  03/28/2015, 11:23 AM

## 2015-03-28 NOTE — Progress Notes (Signed)
Patient and family to talk regarding hospice

## 2015-03-29 ENCOUNTER — Other Ambulatory Visit: Payer: Self-pay | Admitting: Vascular Surgery

## 2015-04-01 ENCOUNTER — Inpatient Hospital Stay

## 2015-04-01 ENCOUNTER — Other Ambulatory Visit: Payer: Self-pay

## 2015-04-01 DIAGNOSIS — R32 Unspecified urinary incontinence: Secondary | ICD-10-CM | POA: Diagnosis not present

## 2015-04-01 DIAGNOSIS — Z8619 Personal history of other infectious and parasitic diseases: Secondary | ICD-10-CM | POA: Diagnosis not present

## 2015-04-01 DIAGNOSIS — Z79899 Other long term (current) drug therapy: Secondary | ICD-10-CM | POA: Diagnosis not present

## 2015-04-01 DIAGNOSIS — K219 Gastro-esophageal reflux disease without esophagitis: Secondary | ICD-10-CM | POA: Diagnosis not present

## 2015-04-01 DIAGNOSIS — D801 Nonfamilial hypogammaglobulinemia: Secondary | ICD-10-CM | POA: Diagnosis not present

## 2015-04-01 DIAGNOSIS — C541 Malignant neoplasm of endometrium: Secondary | ICD-10-CM

## 2015-04-01 DIAGNOSIS — L89301 Pressure ulcer of unspecified buttock, stage 1: Secondary | ICD-10-CM | POA: Diagnosis not present

## 2015-04-01 DIAGNOSIS — E7800 Pure hypercholesterolemia, unspecified: Secondary | ICD-10-CM | POA: Diagnosis not present

## 2015-04-01 DIAGNOSIS — Z86711 Personal history of pulmonary embolism: Secondary | ICD-10-CM | POA: Diagnosis not present

## 2015-04-01 DIAGNOSIS — Z8541 Personal history of malignant neoplasm of cervix uteri: Secondary | ICD-10-CM | POA: Diagnosis not present

## 2015-04-01 DIAGNOSIS — M129 Arthropathy, unspecified: Secondary | ICD-10-CM | POA: Diagnosis not present

## 2015-04-01 DIAGNOSIS — Z87440 Personal history of urinary (tract) infections: Secondary | ICD-10-CM | POA: Diagnosis not present

## 2015-04-01 DIAGNOSIS — E119 Type 2 diabetes mellitus without complications: Secondary | ICD-10-CM | POA: Diagnosis not present

## 2015-04-01 DIAGNOSIS — E538 Deficiency of other specified B group vitamins: Secondary | ICD-10-CM | POA: Diagnosis not present

## 2015-04-01 DIAGNOSIS — I10 Essential (primary) hypertension: Secondary | ICD-10-CM | POA: Diagnosis not present

## 2015-04-01 DIAGNOSIS — Z794 Long term (current) use of insulin: Secondary | ICD-10-CM | POA: Diagnosis not present

## 2015-04-01 DIAGNOSIS — R5383 Other fatigue: Secondary | ICD-10-CM | POA: Diagnosis not present

## 2015-04-01 DIAGNOSIS — I739 Peripheral vascular disease, unspecified: Secondary | ICD-10-CM | POA: Diagnosis not present

## 2015-04-01 DIAGNOSIS — C772 Secondary and unspecified malignant neoplasm of intra-abdominal lymph nodes: Secondary | ICD-10-CM | POA: Diagnosis not present

## 2015-04-01 LAB — PROTIME-INR
INR: 3.32
Prothrombin Time: 33.7 seconds — ABNORMAL HIGH (ref 11.4–15.0)

## 2015-04-02 ENCOUNTER — Other Ambulatory Visit: Payer: Self-pay

## 2015-04-02 ENCOUNTER — Inpatient Hospital Stay

## 2015-04-02 DIAGNOSIS — E538 Deficiency of other specified B group vitamins: Secondary | ICD-10-CM | POA: Diagnosis not present

## 2015-04-02 DIAGNOSIS — C772 Secondary and unspecified malignant neoplasm of intra-abdominal lymph nodes: Secondary | ICD-10-CM | POA: Diagnosis not present

## 2015-04-02 DIAGNOSIS — I10 Essential (primary) hypertension: Secondary | ICD-10-CM | POA: Diagnosis not present

## 2015-04-02 DIAGNOSIS — E7800 Pure hypercholesterolemia, unspecified: Secondary | ICD-10-CM | POA: Diagnosis not present

## 2015-04-02 DIAGNOSIS — R32 Unspecified urinary incontinence: Secondary | ICD-10-CM | POA: Diagnosis not present

## 2015-04-02 DIAGNOSIS — E119 Type 2 diabetes mellitus without complications: Secondary | ICD-10-CM | POA: Diagnosis not present

## 2015-04-02 DIAGNOSIS — I82409 Acute embolism and thrombosis of unspecified deep veins of unspecified lower extremity: Secondary | ICD-10-CM

## 2015-04-02 DIAGNOSIS — Z8541 Personal history of malignant neoplasm of cervix uteri: Secondary | ICD-10-CM | POA: Diagnosis not present

## 2015-04-02 DIAGNOSIS — K219 Gastro-esophageal reflux disease without esophagitis: Secondary | ICD-10-CM | POA: Diagnosis not present

## 2015-04-02 DIAGNOSIS — Z794 Long term (current) use of insulin: Secondary | ICD-10-CM | POA: Diagnosis not present

## 2015-04-02 DIAGNOSIS — L89301 Pressure ulcer of unspecified buttock, stage 1: Secondary | ICD-10-CM | POA: Diagnosis not present

## 2015-04-02 DIAGNOSIS — C541 Malignant neoplasm of endometrium: Secondary | ICD-10-CM | POA: Diagnosis not present

## 2015-04-02 DIAGNOSIS — M129 Arthropathy, unspecified: Secondary | ICD-10-CM | POA: Diagnosis not present

## 2015-04-02 DIAGNOSIS — D801 Nonfamilial hypogammaglobulinemia: Secondary | ICD-10-CM | POA: Diagnosis not present

## 2015-04-02 DIAGNOSIS — Z79899 Other long term (current) drug therapy: Secondary | ICD-10-CM | POA: Diagnosis not present

## 2015-04-02 DIAGNOSIS — I739 Peripheral vascular disease, unspecified: Secondary | ICD-10-CM | POA: Diagnosis not present

## 2015-04-02 DIAGNOSIS — Z87440 Personal history of urinary (tract) infections: Secondary | ICD-10-CM | POA: Diagnosis not present

## 2015-04-02 DIAGNOSIS — Z86711 Personal history of pulmonary embolism: Secondary | ICD-10-CM | POA: Diagnosis not present

## 2015-04-02 DIAGNOSIS — R5383 Other fatigue: Secondary | ICD-10-CM | POA: Diagnosis not present

## 2015-04-02 DIAGNOSIS — Z8619 Personal history of other infectious and parasitic diseases: Secondary | ICD-10-CM | POA: Diagnosis not present

## 2015-04-02 LAB — PROTIME-INR
INR: 2.76
Prothrombin Time: 29.3 seconds — ABNORMAL HIGH (ref 11.4–15.0)

## 2015-04-04 ENCOUNTER — Other Ambulatory Visit
Admission: RE | Admit: 2015-04-04 | Discharge: 2015-04-04 | Disposition: A | Discharge status: Home / Self Care | Payer: Medicare Other | Source: Ambulatory Visit | Attending: Vascular Surgery | Admitting: Vascular Surgery

## 2015-04-04 ENCOUNTER — Ambulatory Visit: Payer: Medicare Other

## 2015-04-04 DIAGNOSIS — Z5181 Encounter for therapeutic drug level monitoring: Secondary | ICD-10-CM | POA: Diagnosis not present

## 2015-04-04 DIAGNOSIS — Z029 Encounter for administrative examinations, unspecified: Secondary | ICD-10-CM | POA: Insufficient documentation

## 2015-04-04 LAB — PROTIME-INR
INR: 2.1
PROTHROMBIN TIME: 23.7 s — AB (ref 11.4–15.0)

## 2015-04-04 LAB — CREATININE, SERUM
CREATININE: 1.53 mg/dL — AB (ref 0.44–1.00)
GFR calc Af Amer: 37 mL/min — ABNORMAL LOW (ref >60)
GFR, EST NON AFRICAN AMERICAN: 32 mL/min — AB (ref >60)

## 2015-04-04 LAB — BUN: BUN: 45 mg/dL — ABNORMAL HIGH (ref 6–20)

## 2015-04-05 ENCOUNTER — Ambulatory Visit
Admission: RE | Admit: 2015-04-05 | Discharge: 2015-04-05 | Disposition: A | Discharge status: Home / Self Care | Payer: Medicare Other | Source: Ambulatory Visit | Attending: Vascular Surgery | Admitting: Vascular Surgery

## 2015-04-05 ENCOUNTER — Inpatient Hospital Stay

## 2015-04-05 ENCOUNTER — Encounter
Admission: RE | Disposition: A | Discharge status: Home / Self Care | Payer: Self-pay | Source: Ambulatory Visit | Attending: Vascular Surgery

## 2015-04-05 DIAGNOSIS — L97519 Non-pressure chronic ulcer of other part of right foot with unspecified severity: Secondary | ICD-10-CM | POA: Diagnosis not present

## 2015-04-05 DIAGNOSIS — Z79899 Other long term (current) drug therapy: Secondary | ICD-10-CM | POA: Insufficient documentation

## 2015-04-05 DIAGNOSIS — I890 Lymphedema, not elsewhere classified: Secondary | ICD-10-CM | POA: Insufficient documentation

## 2015-04-05 DIAGNOSIS — Z7901 Long term (current) use of anticoagulants: Secondary | ICD-10-CM | POA: Diagnosis not present

## 2015-04-05 DIAGNOSIS — I70235 Atherosclerosis of native arteries of right leg with ulceration of other part of foot: Secondary | ICD-10-CM | POA: Insufficient documentation

## 2015-04-05 DIAGNOSIS — E669 Obesity, unspecified: Secondary | ICD-10-CM | POA: Diagnosis not present

## 2015-04-05 DIAGNOSIS — I70243 Atherosclerosis of native arteries of left leg with ulceration of ankle: Secondary | ICD-10-CM | POA: Insufficient documentation

## 2015-04-05 DIAGNOSIS — I70239 Atherosclerosis of native arteries of right leg with ulceration of unspecified site: Secondary | ICD-10-CM | POA: Diagnosis not present

## 2015-04-05 DIAGNOSIS — Z6830 Body mass index (BMI) 30.0-30.9, adult: Secondary | ICD-10-CM | POA: Insufficient documentation

## 2015-04-05 DIAGNOSIS — E785 Hyperlipidemia, unspecified: Secondary | ICD-10-CM | POA: Diagnosis not present

## 2015-04-05 DIAGNOSIS — I10 Essential (primary) hypertension: Secondary | ICD-10-CM | POA: Diagnosis not present

## 2015-04-05 DIAGNOSIS — Z794 Long term (current) use of insulin: Secondary | ICD-10-CM | POA: Insufficient documentation

## 2015-04-05 DIAGNOSIS — E119 Type 2 diabetes mellitus without complications: Secondary | ICD-10-CM | POA: Insufficient documentation

## 2015-04-05 DIAGNOSIS — L97219 Non-pressure chronic ulcer of right calf with unspecified severity: Secondary | ICD-10-CM | POA: Insufficient documentation

## 2015-04-05 HISTORY — PX: PERIPHERAL VASCULAR CATHETERIZATION: SHX172C

## 2015-04-05 SURGERY — LOWER EXTREMITY ANGIOGRAPHY
Anesthesia: Moderate Sedation | Laterality: Right

## 2015-04-05 MED ORDER — SODIUM BICARBONATE BOLUS VIA INFUSION
INTRAVENOUS | Status: AC
  Administered 2015-04-05: 08:00:00 via INTRAVENOUS
  Filled 2015-04-05: qty 1

## 2015-04-05 MED ORDER — HYDROMORPHONE HCL 1 MG/ML IJ SOLN
0.5000 mg | INTRAMUSCULAR | Status: DC | PRN
  Administered 2015-04-05: 1 mg via INTRAVENOUS

## 2015-04-05 MED ORDER — FENTANYL CITRATE (PF) 100 MCG/2ML IJ SOLN
INTRAMUSCULAR | Status: DC | PRN
  Administered 2015-04-05: 150 ug via INTRAVENOUS
  Administered 2015-04-05 (×4): 50 ug via INTRAVENOUS

## 2015-04-05 MED ORDER — HYDROMORPHONE HCL 1 MG/ML IJ SOLN
INTRAMUSCULAR | Status: AC
  Filled 2015-04-05: qty 1

## 2015-04-05 MED ORDER — DEXTROSE 5 % IV SOLN
INTRAVENOUS | Status: AC
  Administered 2015-04-05: 08:00:00
  Filled 2015-04-05: qty 1.5

## 2015-04-05 MED ORDER — METHYLPREDNISOLONE SODIUM SUCC 125 MG IJ SOLR
INTRAMUSCULAR | Status: AC
  Administered 2015-04-05: 08:00:00
  Filled 2015-04-05: qty 2

## 2015-04-05 MED ORDER — DIPHENHYDRAMINE HCL 50 MG/ML IJ SOLN
INTRAMUSCULAR | Status: AC
  Filled 2015-04-05: qty 1

## 2015-04-05 MED ORDER — FAMOTIDINE 20 MG PO TABS
ORAL_TABLET | ORAL | Status: AC
  Administered 2015-04-05: 08:00:00
  Filled 2015-04-05: qty 1

## 2015-04-05 MED ORDER — FENTANYL CITRATE (PF) 100 MCG/2ML IJ SOLN
INTRAMUSCULAR | Status: AC
  Filled 2015-04-05: qty 2

## 2015-04-05 MED ORDER — ACETAMINOPHEN 325 MG PO TABS: 325.0000 mg | ORAL_TABLET | ORAL | Status: DC | PRN

## 2015-04-05 MED ORDER — SODIUM BICARBONATE 8.4 % IV SOLN
INTRAVENOUS | Status: AC
  Administered 2015-04-05: 08:00:00 via INTRAVENOUS
  Filled 2015-04-05: qty 500

## 2015-04-05 MED ORDER — HYDROMORPHONE HCL 1 MG/ML IJ SOLN
INTRAMUSCULAR | Status: AC
  Administered 2015-04-05: 1 mg
  Filled 2015-04-05: qty 1

## 2015-04-05 MED ORDER — CEFAZOLIN SODIUM 1-5 GM-% IV SOLN
INTRAVENOUS | Status: AC
  Filled 2015-04-05: qty 50

## 2015-04-05 MED ORDER — INSULIN ASPART 100 UNIT/ML ~~LOC~~ SOLN: 0.0000 [IU] | SUBCUTANEOUS | Status: DC

## 2015-04-05 MED ORDER — IOHEXOL 300 MG/ML  SOLN
INTRAMUSCULAR | Status: DC | PRN
  Administered 2015-04-05: 70 mL via INTRA_ARTERIAL

## 2015-04-05 MED ORDER — OXYCODONE HCL 5 MG PO TABS: 5.0000 mg | ORAL_TABLET | ORAL | Status: DC | PRN

## 2015-04-05 MED ORDER — MIDAZOLAM HCL 2 MG/2ML IJ SOLN
INTRAMUSCULAR | Status: AC
  Filled 2015-04-05: qty 2

## 2015-04-05 MED ORDER — ACETAMINOPHEN 325 MG RE SUPP: 325.0000 mg | RECTAL | Status: DC | PRN

## 2015-04-05 MED ORDER — HEPARIN SODIUM (PORCINE) 1000 UNIT/ML IJ SOLN
INTRAMUSCULAR | Status: DC | PRN
  Administered 2015-04-05: 4000 [IU] via INTRAVENOUS

## 2015-04-05 MED ORDER — LIDOCAINE HCL (PF) 1 % IJ SOLN
INTRAMUSCULAR | Status: AC
  Filled 2015-04-05: qty 10

## 2015-04-05 MED ORDER — SODIUM CHLORIDE 0.9 % IJ SOLN
INTRAMUSCULAR | Status: AC
  Filled 2015-04-05: qty 15

## 2015-04-05 MED ORDER — SODIUM CHLORIDE 0.9 % IV SOLN
INTRAVENOUS | Status: DC
  Administered 2015-04-05: 08:00:00 via INTRAVENOUS

## 2015-04-05 MED ORDER — DIPHENHYDRAMINE HCL 50 MG/ML IJ SOLN
INTRAMUSCULAR | Status: DC | PRN
  Administered 2015-04-05: 50 mg via INTRAVENOUS

## 2015-04-05 MED ORDER — MIDAZOLAM HCL 5 MG/5ML IJ SOLN
INTRAMUSCULAR | Status: AC
  Filled 2015-04-05: qty 5

## 2015-04-05 MED ORDER — HEPARIN (PORCINE) IN NACL 2-0.9 UNIT/ML-% IJ SOLN
INTRAMUSCULAR | Status: AC
  Filled 2015-04-05: qty 1000

## 2015-04-05 MED ORDER — FENTANYL CITRATE (PF) 100 MCG/2ML IJ SOLN
INTRAMUSCULAR | Status: AC
  Filled 2015-04-05: qty 4

## 2015-04-05 MED ORDER — LIDOCAINE HCL (PF) 1 % IJ SOLN
INTRAMUSCULAR | Status: DC | PRN
  Administered 2015-04-05: 5 mL via INTRADERMAL

## 2015-04-05 MED ORDER — CHLORHEXIDINE GLUCONATE CLOTH 2 % EX PADS: 6.0000 | MEDICATED_PAD | Freq: Once | CUTANEOUS | Status: DC

## 2015-04-05 MED ORDER — HEPARIN SODIUM (PORCINE) 1000 UNIT/ML IJ SOLN
INTRAMUSCULAR | Status: AC
  Filled 2015-04-05: qty 1

## 2015-04-05 MED ORDER — DEXTROSE 5 % IV SOLN: 1.5000 g | INTRAVENOUS | Status: DC

## 2015-04-05 MED ORDER — MIDAZOLAM HCL 2 MG/2ML IJ SOLN
INTRAMUSCULAR | Status: DC | PRN
Start: 2015-04-05 — End: 2015-04-05
  Administered 2015-04-05: 1 mg via INTRAVENOUS
  Administered 2015-04-05: 3 mg via INTRAVENOUS
  Administered 2015-04-05 (×3): 1 mg via INTRAVENOUS

## 2015-04-05 MED ORDER — ONDANSETRON HCL 4 MG/2ML IJ SOLN
4.0000 mg | Freq: Four times a day (QID) | INTRAMUSCULAR | Status: DC | PRN
Start: 2015-04-05 — End: 2015-04-05

## 2015-04-05 SURGICAL SUPPLY — 25 items
BAG DECANTER STRL (MISCELLANEOUS) ×4 IMPLANT
BALLN LUTONIX DCB 5X40X130 (BALLOONS) ×4
BALLN LUTONIX DCB 6X60X130 (BALLOONS) ×4
BALLN ULTRVRSE 2.5X300X150 (BALLOONS) ×4
BALLN ULTRVRSE 2X100X150 (BALLOONS) ×4
BALLOON LUTONIX DCB 5X40X130 (BALLOONS) ×2 IMPLANT
BALLOON LUTONIX DCB 6X60X130 (BALLOONS) ×2 IMPLANT
BALLOON ULTRVRSE 2.5X300X150 (BALLOONS) ×2 IMPLANT
BALLOON ULTRVRSE 2X100X150 (BALLOONS) ×2 IMPLANT
CATH CXI SUPP ST 2.6FR 150CM (MICROCATHETER) ×4 IMPLANT
CATH PIG 70CM (CATHETERS) ×4 IMPLANT
CATH VERT 100CM (CATHETERS) ×4 IMPLANT
DEVICE PRESTO INFLATION (MISCELLANEOUS) ×4 IMPLANT
GLIDEWIRE ANGLED SS 035X260CM (WIRE) ×4 IMPLANT
GOWN STRL XL  DISP (MISCELLANEOUS) ×4 IMPLANT
PACK ANGIOGRAPHY (CUSTOM PROCEDURE TRAY) ×4 IMPLANT
SET INTRO CAPELLA COAXIAL (SET/KITS/TRAYS/PACK) ×4 IMPLANT
SHEATH ANL2 6FRX45 HC (SHEATH) ×4 IMPLANT
SHEATH BRITE TIP 5FRX11 (SHEATH) ×4 IMPLANT
SHEATH BRITE TIP 6FRX11 (SHEATH) ×4 IMPLANT
SYR MEDRAD MARK V 150ML (SYRINGE) ×4 IMPLANT
TUBING CONTRAST HIGH PRESS 72 (TUBING) ×4 IMPLANT
WIRE G V18X300CM (WIRE) ×4 IMPLANT
WIRE J 3MM .035X145CM (WIRE) ×4 IMPLANT
WIRE MAGIC TORQUE 260C (WIRE) ×4 IMPLANT

## 2015-04-05 NOTE — Progress Notes (Signed)
Report given to care nurse of [facility], with orders and plan reviewed, orders sent with pt.

## 2015-04-05 NOTE — Progress Notes (Signed)
Stuart holding direct pressure 75min's with no hematoma at present time, will monitor with head flat and reevaluate again in 40 minutes.

## 2015-04-05 NOTE — Progress Notes (Signed)
DR Delana Meyer present, addressed left groin site, no further bleeding nor hematoma,calling nursing home for report and orders.

## 2015-04-05 NOTE — H&P (Signed)
[facility] History & Physical Update  The patient was interviewed and re-examined.  The patient's previous History and Physical has been reviewed and is unchanged.  There is no change in the plan of care. We plan to proceed with the scheduled procedure.  Khoa Opdahl, Dolores Lory, MD  04/05/2015, 9:42 AM

## 2015-04-05 NOTE — Progress Notes (Signed)
Notified Dr Delana Meyer of episode of hematoma now resolving, will cont to monitor pt for now, prior to discharge. Sister present with pt. No further visible hematoma at present time.

## 2015-04-05 NOTE — Progress Notes (Signed)
As raising head slightly and removing air from pad,as per orders, pt beginning to have swelling under and around area of artery,/groin, hematoma visible, holding pressure per vascular tech/Stuart, with resolving hematoma visible, given dilaudid iv for pain, head flat at this time.

## 2015-04-05 NOTE — Op Note (Signed)
[facility] Percutaneous Study/Intervention Procedural Note   Date of Surgery: 04/05/2015  Surgeon:  Katha Cabal, MD.  Pre-operative Diagnosis: Atherosclerotic occlusive disease bilateral lower extremities with ulceration right foot  Post-operative diagnosis: Same  Procedure(s) Performed: 1. Introduction catheter into right lower extremity 3rd order catheter placement  2. Contrast injection right lower extremity for distal runoff   3. Percutaneous transluminal angioplasty right external iliac and common femoral artery to 6 mm with a Lutonix 4. Percutaneous transluminal angioplasty right anterior tibial to 2.5 mm  Anesthesia: Conscious sedation with IV Versed and fentanyl  Sheath: 6 [place] Ansell left common femoral  Contrast: 70 cc  Fluoroscopy Time: 11.4 minutes  Indications: Shelley Fields presents with ulcerations and pain of the right lower extremity. She has known atherosclerotic occlusive disease and has been treated in the past with and plasty and stenting of the left external iliac. In the office she was noted to have an ABI of 0.52. The risks and benefits are reviewed all questions answered patient agrees to proceed with angiography and the hope for intervention for limb salvage.  Procedure: Shelley Fields is a 79 y.o. y.o. female who was identified and appropriate procedural time out was performed. The patient was then placed supine on the table and prepped and draped in the usual sterile fashion.   Ultrasound was placed in the sterile sleeve and the left groin was evaluated the left common femoral artery was echolucent and pulsatile indicating patency.  Image was recorded for the permanent record and under real-time visualization a microneedle was inserted into the common femoral artery microwire followed by a micro-sheath.  A J-wire was then advanced through the micro-sheath and a  5  [place] sheath was then inserted over a J-wire. J-wire was then advanced and a 5 French pigtail catheter was positioned at the level of T12. AP projection of the aorta was then obtained. Pigtail catheter was repositioned to above the bifurcation and a LAO view of the pelvis was obtained.  Subsequently a pigtail catheter with the stiff angle Glidewire was used to cross the aortic bifurcation the catheter wire were advanced down into the right mid external iliac artery. Oblique view of the femoral bifurcation was then obtained and subsequently the wire was reintroduced and the pigtail catheter negotiated into the SFA representing third order catheter placement. Distal runoff was then performed.  5000 units of heparin was then given and allowed to circulate and a 6 [place] Ansell sheath was advanced up and over the bifurcation and positioned in the right mid external artery  Straight  catheter and stiff angle Glidewire were then negotiated down into the popliteal.  Distal runoff was then completed by hand injection through the catheter. The wire was then reintroduced and a 5 x 4 Lutonix balloon was used to angioplasty the distal right external iliac artery and the right common femoral artery. Inflations were to 14 atmospheres for 2 minutes. Subsequently, a 6 x 6 Lutonix balloon was utilized inflating to 12 atm for 2 full minutes. Follow-up imaging demonstrated less than 30% residual stenosis.  The catheter was then advanced down to the distal popliteal and a magnified image of the trifurcation was obtained. Based on the distal runoff images there were multiple lesions within the anterior tibial and therefore the Glidewire and catheter were negotiated down into the anterior tibial magnified views at the ankle were obtained. Subsequently, a VAT wire was introduced down to the level of the mid forefoot. Initially a  2.5 x 30 balloon was advanced across the proximal two thirds of the anterior tibial inflation was for  approximately 2 minutes 14 atm. Next, a 2 x 10 balloon was advanced across the distal portion extending into the dorsalis pedis again inflation was to 14 atm for approximately 2 minutes. Follow-up imaging demonstrated anterior tibial is now widely patent minimal residual stenosis throughout and there is filled now filling of the dorsalis pedis pedal arch  After review of these images the sheath is pulled into the left external iliac oblique of the common femoral is obtained and a Star close device deployed. There no immediate Complications.  Findings: Initial views of the aortoiliac system demonstrate the aorta and common iliac arteries are patent there is diffuse disease with extensive calcifications. Patient's arterial tree was clearly visualized under plain fluoroscopy based on the calcifications throughout its entire course. Previously placed left external iliac artery stent is patent and there is diffuse disease noted in the distal left external iliac and left common femoral.  The right external iliac is patent in its proximal two thirds but then demonstrates diffuse disease extending into the common femoral with a greater than 80% narrowing at the level of the ileo-inguinal ligament. This is treated initially with a 5 mm Lutonix inflation and subsequent a 6 mm Lutonix inflation with a good result there is less than 30% residual stenosis there is no evidence of dissection and the flow on injection is now significantly faster.  The right SFA is patent as is the popliteal. Trifurcation is patent however there is diffuse disease in the tibioperoneal trunk and the peroneal is noted to be patent but extremely small contributing very little to the distal flow. And posterior tibial is occluded throughout its entire course and does not reconstitute even at the ankle is nonvisualization of the lateral plantar. Anterior tibial is clearly the dominant runoff to the foot and there are half a dozen lesions noted  beginning in its proximal one third and the middle one third and most extensively in the distal one third. Dorsalis pedis is filling but it is small. Following angioplasty to 2.5 mm proximally and 2 mm distally there is now wide patency of the anterior tibial with a significant improvement in filling of the foot  Summary: Successful multilevel revascularization of the right lower extremity for limb salvage   Disposition: Patient was taken to the recovery room in stable condition having tolerated the procedure well.  Schnier, Dolores Lory 03/12/2015,3:14 PM

## 2015-04-05 NOTE — Discharge Instructions (Signed)
Angiogram, Care After °Refer to this sheet in the next few weeks. These instructions provide you with information about caring for yourself after your procedure. Your health care provider may also give you more specific instructions. Your treatment has been planned according to current medical practices, but problems sometimes occur. Call your health care provider if you have any problems or questions after your procedure. °WHAT TO EXPECT AFTER THE PROCEDURE °After your procedure, it is typical to have the following: °· Bruising at the catheter insertion site that usually fades within 1-2 weeks. °· Blood collecting in the tissue (hematoma) that may be painful to the touch. It should usually decrease in size and tenderness within 1-2 weeks. °HOME CARE INSTRUCTIONS °· Take medicines only as directed by your health care provider. °· You may shower 24-48 hours after the procedure or as directed by your health care provider. Remove the bandage (dressing) and gently wash the site with plain soap and water. Pat the area dry with a clean towel. Do not rub the site, because this may cause bleeding. °· Do not take baths, swim, or use a hot tub until your health care provider approves. °· Check your insertion site every day for redness, swelling, or drainage. °· Do not apply powder or lotion to the site. °· Do not lift over 10 lb (4.5 kg) for 5 days after your procedure or as directed by your health care provider. °· Ask your health care provider when it is okay to: °¨ Return to work or school. °¨ Resume usual physical activities or sports. °¨ Resume sexual activity. °· Do not drive home if you are discharged the same day as the procedure. Have someone else drive you. °· You may drive 24 hours after the procedure unless otherwise instructed by your health care provider. °· Do not operate machinery or power tools for 24 hours after the procedure or as directed by your health care provider. °· If your procedure was done as an  outpatient procedure, which means that you went home the same day as your procedure, a responsible adult should be with you for the first 24 hours after you arrive home. °· Keep all follow-up visits as directed by your health care provider. This is important. °SEEK MEDICAL CARE IF: °· You have a fever. °· You have chills. °· You have increased bleeding from the catheter insertion site. Hold pressure on the site. °SEEK IMMEDIATE MEDICAL CARE IF: °· You have unusual pain at the catheter insertion site. °· You have redness, warmth, or swelling at the catheter insertion site. °· You have drainage (other than a small amount of blood on the dressing) from the catheter insertion site. °· The catheter insertion site is bleeding, and the bleeding does not stop after 30 minutes of holding steady pressure on the site. °· The area near or just beyond the catheter insertion site becomes pale, cool, tingly, or numb. °  °This information is not intended to replace advice given to you by your health care provider. Make sure you discuss any questions you have with your health care provider. °  °Document Released: 12/11/2004 Document Revised: 06/15/2014 Document Reviewed: 10/26/2012 °Elsevier Interactive Patient Education ©2016 [employer] ° °

## 2015-04-08 ENCOUNTER — Inpatient Hospital Stay

## 2015-04-08 ENCOUNTER — Encounter: Payer: Self-pay | Admitting: Vascular Surgery

## 2015-04-08 DIAGNOSIS — R32 Unspecified urinary incontinence: Secondary | ICD-10-CM | POA: Diagnosis not present

## 2015-04-08 DIAGNOSIS — Z8541 Personal history of malignant neoplasm of cervix uteri: Secondary | ICD-10-CM | POA: Diagnosis not present

## 2015-04-08 DIAGNOSIS — I739 Peripheral vascular disease, unspecified: Secondary | ICD-10-CM | POA: Diagnosis not present

## 2015-04-08 DIAGNOSIS — Z87440 Personal history of urinary (tract) infections: Secondary | ICD-10-CM | POA: Diagnosis not present

## 2015-04-08 DIAGNOSIS — L89301 Pressure ulcer of unspecified buttock, stage 1: Secondary | ICD-10-CM | POA: Diagnosis not present

## 2015-04-08 DIAGNOSIS — Z86711 Personal history of pulmonary embolism: Secondary | ICD-10-CM | POA: Diagnosis not present

## 2015-04-08 DIAGNOSIS — D689 Coagulation defect, unspecified: Secondary | ICD-10-CM

## 2015-04-08 DIAGNOSIS — Z79899 Other long term (current) drug therapy: Secondary | ICD-10-CM | POA: Diagnosis not present

## 2015-04-08 DIAGNOSIS — E538 Deficiency of other specified B group vitamins: Secondary | ICD-10-CM | POA: Diagnosis not present

## 2015-04-08 DIAGNOSIS — Z8619 Personal history of other infectious and parasitic diseases: Secondary | ICD-10-CM | POA: Diagnosis not present

## 2015-04-08 DIAGNOSIS — Z794 Long term (current) use of insulin: Secondary | ICD-10-CM | POA: Diagnosis not present

## 2015-04-08 DIAGNOSIS — D801 Nonfamilial hypogammaglobulinemia: Secondary | ICD-10-CM | POA: Diagnosis not present

## 2015-04-08 DIAGNOSIS — R5383 Other fatigue: Secondary | ICD-10-CM | POA: Diagnosis not present

## 2015-04-08 DIAGNOSIS — E119 Type 2 diabetes mellitus without complications: Secondary | ICD-10-CM | POA: Diagnosis not present

## 2015-04-08 DIAGNOSIS — M129 Arthropathy, unspecified: Secondary | ICD-10-CM | POA: Diagnosis not present

## 2015-04-08 DIAGNOSIS — E7800 Pure hypercholesterolemia, unspecified: Secondary | ICD-10-CM | POA: Diagnosis not present

## 2015-04-08 DIAGNOSIS — I10 Essential (primary) hypertension: Secondary | ICD-10-CM | POA: Diagnosis not present

## 2015-04-08 DIAGNOSIS — K219 Gastro-esophageal reflux disease without esophagitis: Secondary | ICD-10-CM | POA: Diagnosis not present

## 2015-04-08 DIAGNOSIS — C541 Malignant neoplasm of endometrium: Secondary | ICD-10-CM | POA: Diagnosis not present

## 2015-04-08 DIAGNOSIS — C772 Secondary and unspecified malignant neoplasm of intra-abdominal lymph nodes: Secondary | ICD-10-CM | POA: Diagnosis not present

## 2015-04-08 LAB — PROTIME-INR
INR: 1.49
Prothrombin Time: 18.2 seconds — ABNORMAL HIGH (ref 11.4–15.0)

## 2015-04-08 MED ORDER — CYANOCOBALAMIN 1000 MCG/ML IJ SOLN
1000.0000 ug | Freq: Once | INTRAMUSCULAR | Status: AC
  Administered 2015-04-08: 1000 ug via INTRAMUSCULAR
  Filled 2015-04-08: qty 1

## 2015-04-11 ENCOUNTER — Ambulatory Visit: Payer: Medicare Other

## 2015-04-15 ENCOUNTER — Other Ambulatory Visit: Payer: Self-pay

## 2015-04-15 ENCOUNTER — Inpatient Hospital Stay: Payer: Medicare Other | Attending: Hematology and Oncology

## 2015-04-15 ENCOUNTER — Inpatient Hospital Stay: Payer: Medicare Other

## 2015-04-15 DIAGNOSIS — Z794 Long term (current) use of insulin: Secondary | ICD-10-CM | POA: Insufficient documentation

## 2015-04-15 DIAGNOSIS — R32 Unspecified urinary incontinence: Secondary | ICD-10-CM | POA: Insufficient documentation

## 2015-04-15 DIAGNOSIS — R5383 Other fatigue: Secondary | ICD-10-CM | POA: Diagnosis not present

## 2015-04-15 DIAGNOSIS — L89301 Pressure ulcer of unspecified buttock, stage 1: Secondary | ICD-10-CM | POA: Diagnosis not present

## 2015-04-15 DIAGNOSIS — Z8619 Personal history of other infectious and parasitic diseases: Secondary | ICD-10-CM | POA: Insufficient documentation

## 2015-04-15 DIAGNOSIS — Z86711 Personal history of pulmonary embolism: Secondary | ICD-10-CM | POA: Insufficient documentation

## 2015-04-15 DIAGNOSIS — E119 Type 2 diabetes mellitus without complications: Secondary | ICD-10-CM | POA: Insufficient documentation

## 2015-04-15 DIAGNOSIS — Z8541 Personal history of malignant neoplasm of cervix uteri: Secondary | ICD-10-CM | POA: Insufficient documentation

## 2015-04-15 DIAGNOSIS — K219 Gastro-esophageal reflux disease without esophagitis: Secondary | ICD-10-CM | POA: Insufficient documentation

## 2015-04-15 DIAGNOSIS — E538 Deficiency of other specified B group vitamins: Secondary | ICD-10-CM

## 2015-04-15 DIAGNOSIS — I82599 Chronic embolism and thrombosis of other specified deep vein of unspecified lower extremity: Secondary | ICD-10-CM

## 2015-04-15 DIAGNOSIS — Z87440 Personal history of urinary (tract) infections: Secondary | ICD-10-CM | POA: Insufficient documentation

## 2015-04-15 DIAGNOSIS — Z9221 Personal history of antineoplastic chemotherapy: Secondary | ICD-10-CM | POA: Diagnosis not present

## 2015-04-15 DIAGNOSIS — C541 Malignant neoplasm of endometrium: Secondary | ICD-10-CM | POA: Diagnosis not present

## 2015-04-15 DIAGNOSIS — D689 Coagulation defect, unspecified: Secondary | ICD-10-CM

## 2015-04-15 DIAGNOSIS — Z79899 Other long term (current) drug therapy: Secondary | ICD-10-CM | POA: Insufficient documentation

## 2015-04-15 DIAGNOSIS — C786 Secondary malignant neoplasm of retroperitoneum and peritoneum: Secondary | ICD-10-CM | POA: Diagnosis not present

## 2015-04-15 DIAGNOSIS — I10 Essential (primary) hypertension: Secondary | ICD-10-CM | POA: Diagnosis not present

## 2015-04-15 DIAGNOSIS — E7800 Pure hypercholesterolemia, unspecified: Secondary | ICD-10-CM | POA: Diagnosis not present

## 2015-04-15 DIAGNOSIS — Z7901 Long term (current) use of anticoagulants: Secondary | ICD-10-CM | POA: Insufficient documentation

## 2015-04-15 DIAGNOSIS — Z8719 Personal history of other diseases of the digestive system: Secondary | ICD-10-CM | POA: Diagnosis not present

## 2015-04-15 DIAGNOSIS — Z171 Estrogen receptor negative status [ER-]: Secondary | ICD-10-CM | POA: Insufficient documentation

## 2015-04-15 DIAGNOSIS — I739 Peripheral vascular disease, unspecified: Secondary | ICD-10-CM | POA: Insufficient documentation

## 2015-04-15 DIAGNOSIS — M129 Arthropathy, unspecified: Secondary | ICD-10-CM | POA: Insufficient documentation

## 2015-04-15 LAB — PROTIME-INR
INR: 2.2
Prothrombin Time: 24.6 seconds — ABNORMAL HIGH (ref 11.4–15.0)

## 2015-04-15 MED ORDER — CYANOCOBALAMIN 1000 MCG/ML IJ SOLN
1000.0000 ug | Freq: Once | INTRAMUSCULAR | Status: AC
  Administered 2015-04-15: 1000 ug via INTRAMUSCULAR
  Filled 2015-04-15: qty 1

## 2015-04-16 DIAGNOSIS — Z5181 Encounter for therapeutic drug level monitoring: Secondary | ICD-10-CM | POA: Diagnosis not present

## 2015-04-18 ENCOUNTER — Ambulatory Visit: Payer: Medicare Other

## 2015-04-19 ENCOUNTER — Other Ambulatory Visit: Payer: Self-pay

## 2015-04-19 DIAGNOSIS — C541 Malignant neoplasm of endometrium: Secondary | ICD-10-CM

## 2015-04-21 ENCOUNTER — Other Ambulatory Visit: Payer: Self-pay | Admitting: Hematology and Oncology

## 2015-04-21 ENCOUNTER — Encounter: Payer: Self-pay | Admitting: Hematology and Oncology

## 2015-04-21 DIAGNOSIS — Z7901 Long term (current) use of anticoagulants: Secondary | ICD-10-CM | POA: Insufficient documentation

## 2015-04-22 ENCOUNTER — Inpatient Hospital Stay: Payer: Medicare Other

## 2015-04-22 ENCOUNTER — Other Ambulatory Visit: Payer: Self-pay

## 2015-04-22 DIAGNOSIS — E538 Deficiency of other specified B group vitamins: Secondary | ICD-10-CM | POA: Diagnosis not present

## 2015-04-22 DIAGNOSIS — Z9221 Personal history of antineoplastic chemotherapy: Secondary | ICD-10-CM | POA: Diagnosis not present

## 2015-04-22 DIAGNOSIS — K219 Gastro-esophageal reflux disease without esophagitis: Secondary | ICD-10-CM | POA: Diagnosis not present

## 2015-04-22 DIAGNOSIS — Z86711 Personal history of pulmonary embolism: Secondary | ICD-10-CM | POA: Diagnosis not present

## 2015-04-22 DIAGNOSIS — L89301 Pressure ulcer of unspecified buttock, stage 1: Secondary | ICD-10-CM | POA: Diagnosis not present

## 2015-04-22 DIAGNOSIS — Z8541 Personal history of malignant neoplasm of cervix uteri: Secondary | ICD-10-CM | POA: Diagnosis not present

## 2015-04-22 DIAGNOSIS — Z171 Estrogen receptor negative status [ER-]: Secondary | ICD-10-CM | POA: Diagnosis not present

## 2015-04-22 DIAGNOSIS — I739 Peripheral vascular disease, unspecified: Secondary | ICD-10-CM | POA: Diagnosis not present

## 2015-04-22 DIAGNOSIS — M129 Arthropathy, unspecified: Secondary | ICD-10-CM | POA: Diagnosis not present

## 2015-04-22 DIAGNOSIS — C786 Secondary malignant neoplasm of retroperitoneum and peritoneum: Secondary | ICD-10-CM | POA: Diagnosis not present

## 2015-04-22 DIAGNOSIS — Z79899 Other long term (current) drug therapy: Secondary | ICD-10-CM | POA: Diagnosis not present

## 2015-04-22 DIAGNOSIS — I10 Essential (primary) hypertension: Secondary | ICD-10-CM | POA: Diagnosis not present

## 2015-04-22 DIAGNOSIS — E7800 Pure hypercholesterolemia, unspecified: Secondary | ICD-10-CM | POA: Diagnosis not present

## 2015-04-22 DIAGNOSIS — R5383 Other fatigue: Secondary | ICD-10-CM | POA: Diagnosis not present

## 2015-04-22 DIAGNOSIS — R32 Unspecified urinary incontinence: Secondary | ICD-10-CM | POA: Diagnosis not present

## 2015-04-22 DIAGNOSIS — E119 Type 2 diabetes mellitus without complications: Secondary | ICD-10-CM | POA: Diagnosis not present

## 2015-04-22 DIAGNOSIS — Z8719 Personal history of other diseases of the digestive system: Secondary | ICD-10-CM | POA: Diagnosis not present

## 2015-04-22 DIAGNOSIS — Z7901 Long term (current) use of anticoagulants: Secondary | ICD-10-CM | POA: Diagnosis not present

## 2015-04-22 DIAGNOSIS — I82599 Chronic embolism and thrombosis of other specified deep vein of unspecified lower extremity: Secondary | ICD-10-CM

## 2015-04-22 DIAGNOSIS — Z8619 Personal history of other infectious and parasitic diseases: Secondary | ICD-10-CM | POA: Diagnosis not present

## 2015-04-22 DIAGNOSIS — Z87440 Personal history of urinary (tract) infections: Secondary | ICD-10-CM | POA: Diagnosis not present

## 2015-04-22 DIAGNOSIS — C541 Malignant neoplasm of endometrium: Secondary | ICD-10-CM | POA: Diagnosis not present

## 2015-04-22 DIAGNOSIS — Z794 Long term (current) use of insulin: Secondary | ICD-10-CM | POA: Diagnosis not present

## 2015-04-22 LAB — CBC WITH DIFFERENTIAL/PLATELET
Basophils Absolute: 0.1 10*3/uL (ref 0–0.1)
Basophils Relative: 1 %
Eosinophils Absolute: 0.3 10*3/uL (ref 0–0.7)
Eosinophils Relative: 3 %
HCT: 23.9 % — ABNORMAL LOW (ref 35.0–47.0)
Hemoglobin: 7.8 g/dL — ABNORMAL LOW (ref 12.0–16.0)
Lymphocytes Relative: 14 %
Lymphs Abs: 1.5 10*3/uL (ref 1.0–3.6)
MCH: 28.7 pg (ref 26.0–34.0)
MCHC: 32.5 g/dL (ref 32.0–36.0)
MCV: 88.1 fL (ref 80.0–100.0)
Monocytes Absolute: 0.9 10*3/uL (ref 0.2–0.9)
Monocytes Relative: 9 %
Neutro Abs: 7.6 10*3/uL — ABNORMAL HIGH (ref 1.4–6.5)
Neutrophils Relative %: 73 %
Platelets: 430 10*3/uL (ref 150–440)
RBC: 2.71 MIL/uL — ABNORMAL LOW (ref 3.80–5.20)
RDW: 16.7 % — ABNORMAL HIGH (ref 11.5–14.5)
WBC: 10.3 10*3/uL (ref 3.6–11.0)

## 2015-04-22 LAB — PROTIME-INR
INR: 2.55
Prothrombin Time: 27.1 seconds — ABNORMAL HIGH (ref 11.4–15.0)

## 2015-04-22 MED ORDER — CYANOCOBALAMIN 1000 MCG/ML IJ SOLN
1000.0000 ug | Freq: Once | INTRAMUSCULAR | Status: AC
  Administered 2015-04-22: 1000 ug via INTRAMUSCULAR
  Filled 2015-04-22: qty 1

## 2015-04-23 ENCOUNTER — Other Ambulatory Visit: Payer: Self-pay | Admitting: Family Medicine

## 2015-04-24 DIAGNOSIS — M79671 Pain in right foot: Secondary | ICD-10-CM | POA: Diagnosis not present

## 2015-04-25 ENCOUNTER — Ambulatory Visit: Payer: Medicare Other

## 2015-04-27 ENCOUNTER — Other Ambulatory Visit: Payer: Self-pay | Admitting: Hematology and Oncology

## 2015-04-27 DIAGNOSIS — I10 Essential (primary) hypertension: Secondary | ICD-10-CM | POA: Diagnosis not present

## 2015-04-27 DIAGNOSIS — I5032 Chronic diastolic (congestive) heart failure: Secondary | ICD-10-CM | POA: Diagnosis not present

## 2015-04-27 DIAGNOSIS — L89150 Pressure ulcer of sacral region, unstageable: Secondary | ICD-10-CM | POA: Diagnosis not present

## 2015-04-27 DIAGNOSIS — I119 Hypertensive heart disease without heart failure: Secondary | ICD-10-CM | POA: Diagnosis not present

## 2015-04-27 DIAGNOSIS — I4891 Unspecified atrial fibrillation: Secondary | ICD-10-CM | POA: Diagnosis not present

## 2015-04-29 ENCOUNTER — Inpatient Hospital Stay (HOSPITAL_BASED_OUTPATIENT_CLINIC_OR_DEPARTMENT_OTHER): Payer: Medicare Other | Admitting: Hematology and Oncology

## 2015-04-29 ENCOUNTER — Inpatient Hospital Stay: Payer: Medicare Other

## 2015-04-29 ENCOUNTER — Other Ambulatory Visit: Payer: Medicare Other

## 2015-04-29 ENCOUNTER — Ambulatory Visit: Payer: Medicare Other

## 2015-04-29 VITALS — BP 154/75 | HR 76 | Temp 95.9°F | Resp 18 | Ht 60.0 in

## 2015-04-29 DIAGNOSIS — R32 Unspecified urinary incontinence: Secondary | ICD-10-CM | POA: Diagnosis not present

## 2015-04-29 DIAGNOSIS — C786 Secondary malignant neoplasm of retroperitoneum and peritoneum: Secondary | ICD-10-CM

## 2015-04-29 DIAGNOSIS — Z794 Long term (current) use of insulin: Secondary | ICD-10-CM

## 2015-04-29 DIAGNOSIS — E119 Type 2 diabetes mellitus without complications: Secondary | ICD-10-CM

## 2015-04-29 DIAGNOSIS — Z9221 Personal history of antineoplastic chemotherapy: Secondary | ICD-10-CM

## 2015-04-29 DIAGNOSIS — L89301 Pressure ulcer of unspecified buttock, stage 1: Secondary | ICD-10-CM | POA: Diagnosis not present

## 2015-04-29 DIAGNOSIS — C541 Malignant neoplasm of endometrium: Secondary | ICD-10-CM | POA: Diagnosis not present

## 2015-04-29 DIAGNOSIS — Z86711 Personal history of pulmonary embolism: Secondary | ICD-10-CM

## 2015-04-29 DIAGNOSIS — E538 Deficiency of other specified B group vitamins: Secondary | ICD-10-CM

## 2015-04-29 DIAGNOSIS — Z87440 Personal history of urinary (tract) infections: Secondary | ICD-10-CM

## 2015-04-29 DIAGNOSIS — M7989 Other specified soft tissue disorders: Secondary | ICD-10-CM | POA: Diagnosis not present

## 2015-04-29 DIAGNOSIS — Z7901 Long term (current) use of anticoagulants: Secondary | ICD-10-CM | POA: Diagnosis not present

## 2015-04-29 DIAGNOSIS — Z8541 Personal history of malignant neoplasm of cervix uteri: Secondary | ICD-10-CM | POA: Diagnosis not present

## 2015-04-29 DIAGNOSIS — Z8619 Personal history of other infectious and parasitic diseases: Secondary | ICD-10-CM

## 2015-04-29 DIAGNOSIS — Z8719 Personal history of other diseases of the digestive system: Secondary | ICD-10-CM

## 2015-04-29 DIAGNOSIS — Z171 Estrogen receptor negative status [ER-]: Secondary | ICD-10-CM

## 2015-04-29 DIAGNOSIS — I739 Peripheral vascular disease, unspecified: Secondary | ICD-10-CM | POA: Diagnosis not present

## 2015-04-29 DIAGNOSIS — K219 Gastro-esophageal reflux disease without esophagitis: Secondary | ICD-10-CM | POA: Diagnosis not present

## 2015-04-29 DIAGNOSIS — E7800 Pure hypercholesterolemia, unspecified: Secondary | ICD-10-CM

## 2015-04-29 DIAGNOSIS — I10 Essential (primary) hypertension: Secondary | ICD-10-CM

## 2015-04-29 DIAGNOSIS — C549 Malignant neoplasm of corpus uteri, unspecified: Secondary | ICD-10-CM

## 2015-04-29 DIAGNOSIS — Z79899 Other long term (current) drug therapy: Secondary | ICD-10-CM

## 2015-04-29 DIAGNOSIS — M129 Arthropathy, unspecified: Secondary | ICD-10-CM

## 2015-04-29 DIAGNOSIS — M79609 Pain in unspecified limb: Secondary | ICD-10-CM | POA: Diagnosis not present

## 2015-04-29 DIAGNOSIS — L97509 Non-pressure chronic ulcer of other part of unspecified foot with unspecified severity: Secondary | ICD-10-CM | POA: Diagnosis not present

## 2015-04-29 DIAGNOSIS — R5383 Other fatigue: Secondary | ICD-10-CM

## 2015-04-29 DIAGNOSIS — I82599 Chronic embolism and thrombosis of other specified deep vein of unspecified lower extremity: Secondary | ICD-10-CM

## 2015-04-29 DIAGNOSIS — I872 Venous insufficiency (chronic) (peripheral): Secondary | ICD-10-CM | POA: Diagnosis not present

## 2015-04-29 LAB — PROTIME-INR
INR: 2.53
Prothrombin Time: 26.9 seconds — ABNORMAL HIGH (ref 11.4–15.0)

## 2015-04-29 MED ORDER — CYANOCOBALAMIN 1000 MCG/ML IJ SOLN
1000.0000 ug | Freq: Once | INTRAMUSCULAR | Status: AC
  Administered 2015-04-29: 1000 ug via INTRAMUSCULAR
  Filled 2015-04-29: qty 1

## 2015-04-29 NOTE — Progress Notes (Signed)
[facility] day:  04/29/2015    Chief Complaint: Shelley Fields is a 79 y.o. female with a stage IV endometrial carcinoma and a history of pulmonary embolism on Coumadin who is seen for assessment and discussion regarding direction of therapy.  HPI: The patient was last seen in the medical oncology clinic on 03/28/2015.  At that time,  She was seen for further discussion regarding direction of therapy.  She noted pain in her legs due to peripheral artery disease. She wished to evaluated and have this problem corrected as it was affecting her quality of life. She had been on Hospice since 01/06/2015.  INR was 1.38.  We discussed no plan for chemotherapy given her poor performance status.  She continued weekly B12 for B12 deficiency.  She was to follow-up with vascular surgery.  She underwent percutaneous transluminal angioplasty of the right external iliac and common femoral artery and angioplasty of the right anterior tibial on 04/05/2015 by Dr. Hortencia Pilar.  Coumadin was stopped several days before.  Per family report, INR was 4.2 within the past week.  She received Coumadin 1.5 mg a day and vitamin K 1.25 mg a day.  She subsequently hit her foot.  Her foot is bruised and sore.  Past Medical History  Diagnosis Date  . Diabetes mellitus without complication ([name])   . C. difficile diarrhea   . Uterine cancer ([place])   . Hypogammaglobulinemia ([name])   . Pulmonary embolism ([name])   . Decubitus ulcers   . Hypertension   . Hypercholesterolemia   . Incontinence of urine   . Cancer [name])     Endometrial Cancer  . Cervical cancer ([name])     with radiation 45 years ago...  . Arthritis   . GERD (gastroesophageal reflux disease)   . Bladder cancer ([name])   . Decubitus ulcer of buttock, stage 1     Past Surgical History  Procedure Laterality Date  . Total abdominal hysterectomy w/ bilateral salpingoophorectomy  07/29/2010  . Stent in leg  2016  .  Cataract extraction, bilateral    . Bladder surgery    . Femoral endarterectomy      right  . Leg surgery      right leg  . Portacath placement      right  . Esophagogastroduodenoscopy N/A 12/14/2014    Procedure: ESOPHAGOGASTRODUODENOSCOPY (EGD);  Surgeon: Hulen Luster, MD;  Location: [name] ENDOSCOPY;  Service: Endoscopy;  Laterality: N/A;  . Peripheral vascular catheterization Right 04/05/2015    Procedure: Lower Extremity Angiography;  Surgeon: Katha Cabal, MD;  Location: [place] CV LAB;  Service: Cardiovascular;  Laterality: Right;  . Peripheral vascular catheterization  04/05/2015    Procedure: Lower Extremity Intervention;  Surgeon: Katha Cabal, MD;  Location: [place] [facility];  Service: Cardiovascular;;    Family History  Problem Relation Age of Onset  . Hypertension Mother   . Diabetes Mellitus II Mother   . Coronary artery disease Mother   . Hypertension Father   . Diabetes Mellitus II Father   . CAD Father     Social History:  reports that she has never smoked. She does not have any smokeless tobacco history on file. She reports that she does not drink alcohol or use illicit drugs.  The patient is accompanied by her sister, Shelley Fields.  Her daughter, Shelley Fields lives in [name].  Her sister can not care for her.  Her daughter's cell number is  [phone].   Allergies:  Allergies  Allergen Reactions  . Contrast Media [Iodinated Diagnostic Agents]     Current Medications: Current Outpatient Prescriptions  Medication Sig Dispense Refill  . amLODipine (NORVASC) 5 MG tablet TAKE 1 TABLET BY MOUTH EVERY DAY 90 tablet 3  . atenolol (TENORMIN) 100 MG tablet Take 1 tablet by mouth daily.  3  . atenolol (TENORMIN) 50 MG tablet Take 50 mg by mouth daily.    . benazepril (LOTENSIN) 40 MG tablet Take 40 mg by mouth daily.    . cloNIDine (CATAPRES) 0.2 MG tablet Take 0.2 mg by mouth 2 (two) times daily.    . furosemide (LASIX) 20 MG tablet Take 20 mg by mouth every  other day.    Marland Kitchen HUMULIN 70/30 (70-30) 100 UNIT/ML injection 16 Units daily at 6 PM.    . HUMULIN 70/30 KWIKPEN (70-30) 100 UNIT/ML PEN     . hydrALAZINE (APRESOLINE) 100 MG tablet Take 100 mg by mouth 2 (two) times daily.    Marland Kitchen lovastatin (MEVACOR) 20 MG tablet TAKE 1 TABLET BY MOUTH AT BEDTIME 30 tablet 0  . meclizine (ANTIVERT) 25 MG tablet Take 25 mg by mouth daily.    . megestrol (MEGACE ES) 625 MG/5ML suspension Take 1.6 mLs (200 mg total) by mouth daily. to stimulate appetite 150 mL 0  . mirtazapine (REMERON) 15 MG tablet Take 15 mg by mouth daily.    . mupirocin ointment (BACTROBAN) 2 %     . NOVOLOG FLEXPEN 100 UNIT/ML FlexPen Per Sliding Scale...    . nystatin (MYCOSTATIN) powder     . omeprazole (PRILOSEC) 20 MG capsule Take 20 mg by mouth daily.  12  . potassium chloride (K-DUR) 10 MEQ tablet Take 1 tablet by mouth daily.  0  . promethazine (PHENERGAN) 25 MG tablet Take 25 mg by mouth every 8 (eight) hours as needed for nausea or vomiting.    Marland Kitchen SANTYL ointment     . tamsulosin (FLOMAX) 0.4 MG CAPS capsule Take 0.4 mg by mouth 2 (two) times daily.    Marland Kitchen VITAMIN K, PHYTONADIONE, PO Take 1.25 mg by mouth daily.    Marland Kitchen warfarin (COUMADIN) 1 MG tablet Take 1.5 mg by mouth daily.     No current facility-administered medications for this visit.   Facility-Administered Medications Ordered in Other Visits  Medication Dose Route Frequency Provider Last Rate Last Dose  . sodium chloride 0.9 % injection 10 mL  10 mL Intravenous PRN Evlyn Kanner, NP   10 mL at 11/02/14 [id number]    Review of Systems:  GENERAL:  Chronic fatigue. Minimal activity (stable).  No fevers, sweats.  Weight unable to be obtained. PERFORMANCE STATUS (ECOG):  3-4 HEENT:  No visual changes, runny nose, sore throat, mouth sores or tenderness. Lungs: No shortness of breath or cough.  No hemoptysis. Cardiac:  No chest pain, palpitations, orthopnea, or PND. GI:  No nausea, vomiting, diarrhea, constipation, melena or  hematochezia. GU:  Foley.  No urgency, frequency, dysuria, or hematuria. Musculoskeletal:  No back pain.  No joint pain.  No muscle tenderness. Extremities:  s/p vascular procedure.  Foot pain s/p traua. Skin:  Sacral decubitus ulcer, healed.  Neuro:  General weakness.  No headache, numbness or weakness, balance or coordination issues. Endocrine:  Diabetes.  No thyroid issues, hot flashes or night sweats. Psych:  No mood changes, depression or anxiety. Pain:  No focal pain. Review of systems:  All other systems reviewed and found to be negative.  Physical Exam: Blood pressure 154/75, pulse 76, temperature 95.9 F (35.5 C), temperature source Tympanic, resp. rate 18, height 5' (1.524 m). GENERAL:  Chronically ill appearing woman sitting comfortably in a wheelchair in no acute distress. MENTAL STATUS:  Alert and oriented to person, place and time. HEAD: Pearline Cables thin hair.  Normocephalic, atraumatic, face symmetric, no Cushingoid features.   EYES:  Pupils equal round and reactive to light and accommodation.  No conjunctivitis or scleral icterus. ENT:  Oropharynx clear without lesion.  No thrush.  RESPIRATORY:  Clear to auscultation without rales, wheezes or rhonchi. CARDIOVASCULAR:  Regular rate and rhythm without murmur, rub or gallop. ABDOMEN:  Soft, non-tender, with active bowel sounds, and no hepatosplenomegaly.  No masses. SKIN:  Left cheek basal cell carcinoma.   EXTREMITIES: Left leg edema.  Right foot edema.  Discolored.  Cool. Tender.  No palpable cords. NEUROLOGICAL: Unremarkable. PSYCH:  Appropriate.  Appointment on 04/29/2015  Component Date Value Ref Range Status  . Prothrombin Time 04/29/2015 26.9* 11.4 - 15.0 seconds Final  . INR 04/29/2015 2.53   Final    Assessment:  Shelley Fields is a 79 y.o. female with recurrent stage IV endoemetrial carcinoma.  She underwent TAH/BSO on 07/29/2010.  She was initially felt to have have IIIC disease, but later stage IV disease secondary  to likely liver metastasis.  Tumor was ER negative.  She received 11 cycles of carboplatin and Taxol (10/07/2010 until 06/29/2011). She required dose modifications secondary to cytopenias.  She has been followed with serial CA 125s. There has been a steady increase in her CA 125 (1936 on 06/20/2014 and 4186 on 11/21/2014). She has not been treated by Dr. Inez Pilgrim secondary being asymptomatic. Notes indicate imaging studies revealed slight nodal progression.  Her performance status limits the ability to treat her endometrial cancer.  Abdominal and pelvic CT scan on 11/21/2014 revealed progressive nodal metastatic disease throughout the retroperitoneum. A precaval node measured 4.2 x 3.6 cm (previously 3.8 x 3.5 cm) the left periaortic node measured 3.6 x 2.6 cm (previously 3 x 2.4 cm) There was no evidence of extranodal metastasis in the abdomen or pelvis although imaging was limited by nocontrast. There was a persistent concern of a lingular nodule.   Her course has been complicated by a pulmonary embolism in 08/2010. She is on chronic Coumadin. She has a history of recurrent C. difficile infection and hypogammaglobulinemia.  She has a recent history of UTI sepsis, sacral decubitus, and acute renal insufficiency.   She was admitted to [facility] from 12/12/2014 - 12/17/2014 with an upper GI bleed, Klebsiella UTI with sepsis, decubitus ulcer, and general debilitation.  Initial hemoglobin was 3.  Initial INR was 1.37.  She was transfused with packed red blood cells.  EGD revealed mild gastropathy involving the antrum.    She is B12 deficient (147 on 03/14/2015).  She began weekly B12 x 6 on 03/21/2015.  She underwent percutaneous transluminal angioplasty of the right external iliac and common femoral artery and angioplasty of the right anterior tibial on 04/05/2015.  Coumadin was stopped several days before.  INR has ben variable.  She received Coumadin 1.5 mg a day and vitamin K 1.25 mg a day.  She recently  experienced trauma to her foot.  Foot is discolored and cool.  She remains at [facility].  Performance status is poor.    Plan: 1.  Review interim events. 2.  Discuss continuation of Hospice. 3.  Contact vascular surgery today regarding foot. 4.  Continue B12. 5.  Dr. Tamala Julian to monitor and adjust Coumadin. 6.  RTC in 1 month for MD assessment and B12.   Lequita Asal, MD  04/29/2015, 11:12 AM

## 2015-05-03 DIAGNOSIS — Z86711 Personal history of pulmonary embolism: Secondary | ICD-10-CM | POA: Diagnosis not present

## 2015-05-03 DIAGNOSIS — Z86718 Personal history of other venous thrombosis and embolism: Secondary | ICD-10-CM | POA: Diagnosis not present

## 2015-05-03 DIAGNOSIS — I739 Peripheral vascular disease, unspecified: Secondary | ICD-10-CM | POA: Diagnosis not present

## 2015-05-06 ENCOUNTER — Inpatient Hospital Stay: Payer: Medicare Other

## 2015-05-06 ENCOUNTER — Other Ambulatory Visit: Payer: Self-pay | Admitting: Vascular Surgery

## 2015-05-09 DIAGNOSIS — M25571 Pain in right ankle and joints of right foot: Secondary | ICD-10-CM | POA: Diagnosis not present

## 2015-05-09 DIAGNOSIS — M79671 Pain in right foot: Secondary | ICD-10-CM | POA: Diagnosis not present

## 2015-05-10 DIAGNOSIS — Z86718 Personal history of other venous thrombosis and embolism: Secondary | ICD-10-CM | POA: Diagnosis not present

## 2015-05-10 DIAGNOSIS — Z86711 Personal history of pulmonary embolism: Secondary | ICD-10-CM | POA: Diagnosis not present

## 2015-05-10 DIAGNOSIS — I739 Peripheral vascular disease, unspecified: Secondary | ICD-10-CM | POA: Diagnosis not present

## 2015-05-10 DIAGNOSIS — I998 Other disorder of circulatory system: Secondary | ICD-10-CM | POA: Diagnosis not present

## 2015-05-13 DIAGNOSIS — Z5181 Encounter for therapeutic drug level monitoring: Secondary | ICD-10-CM | POA: Diagnosis not present

## 2015-05-14 ENCOUNTER — Encounter: Payer: Self-pay | Admitting: *Deleted

## 2015-05-14 ENCOUNTER — Encounter
Admission: RE | Disposition: A | Discharge status: Skilled Nursing Facility | Payer: Self-pay | Source: Ambulatory Visit | Attending: Vascular Surgery

## 2015-05-14 ENCOUNTER — Ambulatory Visit
Admission: RE | Admit: 2015-05-14 | Discharge: 2015-05-14 | Disposition: A | Discharge status: Skilled Nursing Facility | Payer: Medicare Other | Source: Ambulatory Visit | Attending: Vascular Surgery | Admitting: Vascular Surgery

## 2015-05-14 DIAGNOSIS — T508X5A Adverse effect of diagnostic agents, initial encounter: Secondary | ICD-10-CM | POA: Diagnosis present

## 2015-05-14 DIAGNOSIS — L89319 Pressure ulcer of right buttock, unspecified stage: Secondary | ICD-10-CM | POA: Diagnosis not present

## 2015-05-14 DIAGNOSIS — I10 Essential (primary) hypertension: Secondary | ICD-10-CM | POA: Insufficient documentation

## 2015-05-14 DIAGNOSIS — B379 Candidiasis, unspecified: Secondary | ICD-10-CM | POA: Diagnosis not present

## 2015-05-14 DIAGNOSIS — J810 Acute pulmonary edema: Secondary | ICD-10-CM | POA: Diagnosis not present

## 2015-05-14 DIAGNOSIS — Z7901 Long term (current) use of anticoagulants: Secondary | ICD-10-CM

## 2015-05-14 DIAGNOSIS — D631 Anemia in chronic kidney disease: Secondary | ICD-10-CM | POA: Diagnosis present

## 2015-05-14 DIAGNOSIS — C55 Malignant neoplasm of uterus, part unspecified: Secondary | ICD-10-CM | POA: Diagnosis not present

## 2015-05-14 DIAGNOSIS — E669 Obesity, unspecified: Secondary | ICD-10-CM | POA: Insufficient documentation

## 2015-05-14 DIAGNOSIS — N184 Chronic kidney disease, stage 4 (severe): Secondary | ICD-10-CM | POA: Diagnosis not present

## 2015-05-14 DIAGNOSIS — Z833 Family history of diabetes mellitus: Secondary | ICD-10-CM | POA: Diagnosis not present

## 2015-05-14 DIAGNOSIS — F0390 Unspecified dementia without behavioral disturbance: Secondary | ICD-10-CM | POA: Diagnosis not present

## 2015-05-14 DIAGNOSIS — I5031 Acute diastolic (congestive) heart failure: Secondary | ICD-10-CM | POA: Diagnosis not present

## 2015-05-14 DIAGNOSIS — I96 Gangrene, not elsewhere classified: Secondary | ICD-10-CM | POA: Diagnosis not present

## 2015-05-14 DIAGNOSIS — D649 Anemia, unspecified: Secondary | ICD-10-CM | POA: Diagnosis not present

## 2015-05-14 DIAGNOSIS — N183 Chronic kidney disease, stage 3 (moderate): Secondary | ICD-10-CM | POA: Diagnosis present

## 2015-05-14 DIAGNOSIS — I890 Lymphedema, not elsewhere classified: Secondary | ICD-10-CM | POA: Insufficient documentation

## 2015-05-14 DIAGNOSIS — E1122 Type 2 diabetes mellitus with diabetic chronic kidney disease: Secondary | ICD-10-CM | POA: Diagnosis not present

## 2015-05-14 DIAGNOSIS — Z66 Do not resuscitate: Secondary | ICD-10-CM | POA: Diagnosis present

## 2015-05-14 DIAGNOSIS — E11649 Type 2 diabetes mellitus with hypoglycemia without coma: Secondary | ICD-10-CM | POA: Diagnosis present

## 2015-05-14 DIAGNOSIS — Z9841 Cataract extraction status, right eye: Secondary | ICD-10-CM | POA: Diagnosis not present

## 2015-05-14 DIAGNOSIS — I1310 Hypertensive heart and chronic kidney disease without heart failure, with stage 1 through stage 4 chronic kidney disease, or unspecified chronic kidney disease: Secondary | ICD-10-CM | POA: Diagnosis not present

## 2015-05-14 DIAGNOSIS — J811 Chronic pulmonary edema: Secondary | ICD-10-CM | POA: Diagnosis not present

## 2015-05-14 DIAGNOSIS — Z9861 Coronary angioplasty status: Secondary | ICD-10-CM | POA: Diagnosis not present

## 2015-05-14 DIAGNOSIS — L97219 Non-pressure chronic ulcer of right calf with unspecified severity: Secondary | ICD-10-CM | POA: Insufficient documentation

## 2015-05-14 DIAGNOSIS — M79609 Pain in unspecified limb: Secondary | ICD-10-CM | POA: Diagnosis not present

## 2015-05-14 DIAGNOSIS — Z9842 Cataract extraction status, left eye: Secondary | ICD-10-CM | POA: Diagnosis not present

## 2015-05-14 DIAGNOSIS — E119 Type 2 diabetes mellitus without complications: Secondary | ICD-10-CM

## 2015-05-14 DIAGNOSIS — L97519 Non-pressure chronic ulcer of other part of right foot with unspecified severity: Secondary | ICD-10-CM | POA: Diagnosis not present

## 2015-05-14 DIAGNOSIS — Z8551 Personal history of malignant neoplasm of bladder: Secondary | ICD-10-CM | POA: Diagnosis not present

## 2015-05-14 DIAGNOSIS — E1152 Type 2 diabetes mellitus with diabetic peripheral angiopathy with gangrene: Secondary | ICD-10-CM | POA: Diagnosis not present

## 2015-05-14 DIAGNOSIS — I255 Ischemic cardiomyopathy: Secondary | ICD-10-CM | POA: Diagnosis present

## 2015-05-14 DIAGNOSIS — I5033 Acute on chronic diastolic (congestive) heart failure: Secondary | ICD-10-CM | POA: Diagnosis not present

## 2015-05-14 DIAGNOSIS — M1990 Unspecified osteoarthritis, unspecified site: Secondary | ICD-10-CM | POA: Diagnosis not present

## 2015-05-14 DIAGNOSIS — Z8541 Personal history of malignant neoplasm of cervix uteri: Secondary | ICD-10-CM | POA: Diagnosis not present

## 2015-05-14 DIAGNOSIS — Z6830 Body mass index (BMI) 30.0-30.9, adult: Secondary | ICD-10-CM

## 2015-05-14 DIAGNOSIS — K219 Gastro-esophageal reflux disease without esophagitis: Secondary | ICD-10-CM | POA: Diagnosis not present

## 2015-05-14 DIAGNOSIS — E785 Hyperlipidemia, unspecified: Secondary | ICD-10-CM

## 2015-05-14 DIAGNOSIS — I214 Non-ST elevation (NSTEMI) myocardial infarction: Secondary | ICD-10-CM | POA: Diagnosis not present

## 2015-05-14 DIAGNOSIS — R0600 Dyspnea, unspecified: Secondary | ICD-10-CM | POA: Diagnosis not present

## 2015-05-14 DIAGNOSIS — Z90710 Acquired absence of both cervix and uterus: Secondary | ICD-10-CM | POA: Diagnosis not present

## 2015-05-14 DIAGNOSIS — I2699 Other pulmonary embolism without acute cor pulmonale: Secondary | ICD-10-CM | POA: Diagnosis not present

## 2015-05-14 DIAGNOSIS — Z79899 Other long term (current) drug therapy: Secondary | ICD-10-CM

## 2015-05-14 DIAGNOSIS — Z9889 Other specified postprocedural states: Secondary | ICD-10-CM | POA: Diagnosis not present

## 2015-05-14 DIAGNOSIS — Z90722 Acquired absence of ovaries, bilateral: Secondary | ICD-10-CM | POA: Diagnosis not present

## 2015-05-14 DIAGNOSIS — I509 Heart failure, unspecified: Secondary | ICD-10-CM | POA: Diagnosis not present

## 2015-05-14 DIAGNOSIS — N141 Nephropathy induced by other drugs, medicaments and biological substances: Secondary | ICD-10-CM | POA: Diagnosis present

## 2015-05-14 DIAGNOSIS — Z794 Long term (current) use of insulin: Secondary | ICD-10-CM

## 2015-05-14 DIAGNOSIS — R0602 Shortness of breath: Secondary | ICD-10-CM | POA: Diagnosis not present

## 2015-05-14 DIAGNOSIS — Z7902 Long term (current) use of antithrombotics/antiplatelets: Secondary | ICD-10-CM | POA: Insufficient documentation

## 2015-05-14 DIAGNOSIS — Z86711 Personal history of pulmonary embolism: Secondary | ICD-10-CM | POA: Diagnosis not present

## 2015-05-14 DIAGNOSIS — M6281 Muscle weakness (generalized): Secondary | ICD-10-CM | POA: Diagnosis not present

## 2015-05-14 DIAGNOSIS — E7800 Pure hypercholesterolemia, unspecified: Secondary | ICD-10-CM | POA: Diagnosis not present

## 2015-05-14 DIAGNOSIS — E1165 Type 2 diabetes mellitus with hyperglycemia: Secondary | ICD-10-CM | POA: Diagnosis not present

## 2015-05-14 DIAGNOSIS — Z7982 Long term (current) use of aspirin: Secondary | ICD-10-CM | POA: Diagnosis not present

## 2015-05-14 DIAGNOSIS — Z8249 Family history of ischemic heart disease and other diseases of the circulatory system: Secondary | ICD-10-CM | POA: Diagnosis not present

## 2015-05-14 DIAGNOSIS — R0902 Hypoxemia: Secondary | ICD-10-CM | POA: Diagnosis present

## 2015-05-14 DIAGNOSIS — I70261 Atherosclerosis of native arteries of extremities with gangrene, right leg: Secondary | ICD-10-CM

## 2015-05-14 DIAGNOSIS — Z91041 Radiographic dye allergy status: Secondary | ICD-10-CM | POA: Diagnosis not present

## 2015-05-14 DIAGNOSIS — I739 Peripheral vascular disease, unspecified: Secondary | ICD-10-CM | POA: Diagnosis not present

## 2015-05-14 DIAGNOSIS — I129 Hypertensive chronic kidney disease with stage 1 through stage 4 chronic kidney disease, or unspecified chronic kidney disease: Secondary | ICD-10-CM | POA: Diagnosis not present

## 2015-05-14 DIAGNOSIS — Z923 Personal history of irradiation: Secondary | ICD-10-CM | POA: Diagnosis not present

## 2015-05-14 DIAGNOSIS — R079 Chest pain, unspecified: Secondary | ICD-10-CM | POA: Diagnosis not present

## 2015-05-14 DIAGNOSIS — Z8542 Personal history of malignant neoplasm of other parts of uterus: Secondary | ICD-10-CM | POA: Diagnosis not present

## 2015-05-14 DIAGNOSIS — L89150 Pressure ulcer of sacral region, unstageable: Secondary | ICD-10-CM | POA: Diagnosis not present

## 2015-05-14 DIAGNOSIS — N179 Acute kidney failure, unspecified: Secondary | ICD-10-CM | POA: Diagnosis not present

## 2015-05-14 HISTORY — DX: Anemia, unspecified: D64.9

## 2015-05-14 HISTORY — PX: PERIPHERAL VASCULAR CATHETERIZATION: SHX172C

## 2015-05-14 HISTORY — DX: Peripheral vascular disease, unspecified: I73.9

## 2015-05-14 LAB — BASIC METABOLIC PANEL
ANION GAP: 8 (ref 5–15)
BUN: 57 mg/dL — AB (ref 6–20)
CHLORIDE: 110 mmol/L (ref 101–111)
CO2: 19 mmol/L — AB (ref 22–32)
Calcium: 8 mg/dL — ABNORMAL LOW (ref 8.9–10.3)
Creatinine, Ser: 1.75 mg/dL — ABNORMAL HIGH (ref 0.44–1.00)
GFR calc Af Amer: 31 mL/min — ABNORMAL LOW (ref >60)
GFR, EST NON AFRICAN AMERICAN: 27 mL/min — AB (ref >60)
GLUCOSE: 200 mg/dL — AB (ref 65–99)
POTASSIUM: 4.1 mmol/L (ref 3.5–5.1)
Sodium: 137 mmol/L (ref 135–145)

## 2015-05-14 LAB — PROTIME-INR
INR: 1.75
Prothrombin Time: 20.4 seconds — ABNORMAL HIGH (ref 11.4–15.0)

## 2015-05-14 SURGERY — LOWER EXTREMITY ANGIOGRAPHY
Anesthesia: Moderate Sedation | Laterality: Right

## 2015-05-14 MED ORDER — METHYLPREDNISOLONE SODIUM SUCC 125 MG IJ SOLR
INTRAMUSCULAR | Status: AC
  Administered 2015-05-14: 125 mg
  Filled 2015-05-14: qty 2

## 2015-05-14 MED ORDER — LABETALOL HCL 5 MG/ML IV SOLN: 10.0000 mg | INTRAVENOUS | Status: DC | PRN

## 2015-05-14 MED ORDER — LIDOCAINE HCL 1 % IJ SOLN
INTRAMUSCULAR | Status: DC | PRN
  Administered 2015-05-14: 5 mL

## 2015-05-14 MED ORDER — ACETAMINOPHEN 325 MG RE SUPP
325.0000 mg | RECTAL | Status: DC | PRN
  Filled 2015-05-14: qty 2

## 2015-05-14 MED ORDER — ONDANSETRON HCL 4 MG/2ML IJ SOLN: 4.0000 mg | Freq: Four times a day (QID) | INTRAMUSCULAR | Status: DC | PRN

## 2015-05-14 MED ORDER — HYDROMORPHONE HCL 1 MG/ML IJ SOLN
1.0000 mg | Freq: Once | INTRAMUSCULAR | Status: AC
  Administered 2015-05-14 (×2): 1 mg via INTRAVENOUS

## 2015-05-14 MED ORDER — HEPARIN (PORCINE) IN NACL 2-0.9 UNIT/ML-% IJ SOLN
INTRAMUSCULAR | Status: AC
  Filled 2015-05-14: qty 1000

## 2015-05-14 MED ORDER — SODIUM BICARBONATE BOLUS VIA INFUSION
INTRAVENOUS | Status: AC
Start: 2015-05-14 — End: 2015-05-14
  Administered 2015-05-14: 08:00:00 via INTRAVENOUS
  Filled 2015-05-14 (×2): qty 1

## 2015-05-14 MED ORDER — HYDRALAZINE HCL 20 MG/ML IJ SOLN: 5.0000 mg | INTRAMUSCULAR | Status: DC | PRN

## 2015-05-14 MED ORDER — MIDAZOLAM HCL 2 MG/2ML IJ SOLN
INTRAMUSCULAR | Status: DC | PRN
  Administered 2015-05-14: 1 mg via INTRAVENOUS
  Administered 2015-05-14 (×2): 2 mg via INTRAVENOUS

## 2015-05-14 MED ORDER — INSULIN ASPART 100 UNIT/ML ~~LOC~~ SOLN: 0.0000 [IU] | SUBCUTANEOUS | Status: DC

## 2015-05-14 MED ORDER — HYDROMORPHONE HCL 1 MG/ML IJ SOLN: 0.5000 mg | INTRAMUSCULAR | Status: DC | PRN

## 2015-05-14 MED ORDER — SODIUM CHLORIDE 0.9 % IV SOLN
INTRAVENOUS | Status: DC
  Administered 2015-05-14 (×2): via INTRAVENOUS

## 2015-05-14 MED ORDER — OXYCODONE HCL 5 MG PO TABS: 5.0000 mg | ORAL_TABLET | ORAL | Status: DC | PRN

## 2015-05-14 MED ORDER — HYDROMORPHONE HCL 1 MG/ML IJ SOLN
INTRAMUSCULAR | Status: AC
  Administered 2015-05-14: 1 mg via INTRAVENOUS
  Filled 2015-05-14: qty 1

## 2015-05-14 MED ORDER — HEPARIN SODIUM (PORCINE) 1000 UNIT/ML IJ SOLN
INTRAMUSCULAR | Status: DC | PRN
  Administered 2015-05-14: 4000 [IU] via INTRAVENOUS

## 2015-05-14 MED ORDER — LIDOCAINE HCL (PF) 1 % IJ SOLN
INTRAMUSCULAR | Status: AC
  Filled 2015-05-14: qty 10

## 2015-05-14 MED ORDER — DEXTROSE 5 % IV SOLN
INTRAVENOUS | Status: AC
  Administered 2015-05-14: 09:00:00 via INTRAVENOUS
  Filled 2015-05-14: qty 500

## 2015-05-14 MED ORDER — IOHEXOL 300 MG/ML  SOLN
INTRAMUSCULAR | Status: DC | PRN
  Administered 2015-05-14: 80 mL via INTRA_ARTERIAL

## 2015-05-14 MED ORDER — FENTANYL CITRATE (PF) 100 MCG/2ML IJ SOLN
INTRAMUSCULAR | Status: AC
  Filled 2015-05-14: qty 2

## 2015-05-14 MED ORDER — ACETAMINOPHEN 325 MG PO TABS: 325.0000 mg | ORAL_TABLET | ORAL | Status: DC | PRN

## 2015-05-14 MED ORDER — CEFUROXIME SODIUM 1.5 G IJ SOLR
1.5000 g | INTRAMUSCULAR | Status: AC
  Administered 2015-05-14: 1.5 g via INTRAVENOUS
  Filled 2015-05-14: qty 1.5

## 2015-05-14 MED ORDER — ALUM & MAG HYDROXIDE-SIMETH 200-200-20 MG/5ML PO SUSP: 15.0000 mL | ORAL | Status: DC | PRN

## 2015-05-14 MED ORDER — FENTANYL CITRATE (PF) 100 MCG/2ML IJ SOLN
INTRAMUSCULAR | Status: DC | PRN
  Administered 2015-05-14: 100 ug via INTRAVENOUS
  Administered 2015-05-14: 50 ug via INTRAVENOUS
  Administered 2015-05-14: 100 ug via INTRAVENOUS
  Administered 2015-05-14: 50 ug via INTRAVENOUS

## 2015-05-14 MED ORDER — HEPARIN SODIUM (PORCINE) 1000 UNIT/ML IJ SOLN
INTRAMUSCULAR | Status: AC
Start: 2015-05-14 — End: 2015-05-14
  Filled 2015-05-14: qty 1

## 2015-05-14 MED ORDER — FAMOTIDINE 20 MG PO TABS
ORAL_TABLET | ORAL | Status: AC
  Administered 2015-05-14: 20 mg
  Filled 2015-05-14: qty 1

## 2015-05-14 MED ORDER — MIDAZOLAM HCL 5 MG/5ML IJ SOLN
INTRAMUSCULAR | Status: AC
  Filled 2015-05-14: qty 5

## 2015-05-14 SURGICAL SUPPLY — 24 items
BALLN LUTONIX DCB 5X40X130 (BALLOONS) ×3
BALLN LUTONIX DCB 7X40X130 (BALLOONS) ×3
BALLN ULTRVRSE 2X220X150 (BALLOONS) ×3
BALLN ULTRVRSE 6X40X130 (BALLOONS) ×2
BALLN ULTRVRSE 6X40X130 OTE (BALLOONS) ×1
BALLOON LUTONIX DCB 5X40X130 (BALLOONS) ×1 IMPLANT
BALLOON LUTONIX DCB 7X40X130 (BALLOONS) ×1 IMPLANT
BALLOON ULTRVRSE 2X220X150 (BALLOONS) ×1 IMPLANT
BALLOON ULTRVRSE 6X40X130 OTE (BALLOONS) ×1 IMPLANT
CATH PIG 70CM (CATHETERS) ×3 IMPLANT
CATH STS 5FR 125CM (CATHETERS) ×3 IMPLANT
DEVICE PRESTO INFLATION (MISCELLANEOUS) ×3 IMPLANT
GLIDEWIRE ANGLED SS 035X260CM (WIRE) ×3 IMPLANT
GUIDEWIRE SUPER STIFF .035X180 (WIRE) ×3 IMPLANT
PACK ANGIOGRAPHY (CUSTOM PROCEDURE TRAY) ×3 IMPLANT
SET INTRO CAPELLA COAXIAL (SET/KITS/TRAYS/PACK) ×3 IMPLANT
SHEATH ANL2 6FRX45 HC (SHEATH) ×3 IMPLANT
SHEATH BRITE TIP 5FRX11 (SHEATH) ×3 IMPLANT
SHEATH BRITE TIP 6FRX11 (SHEATH) ×3 IMPLANT
SYR MEDRAD MARK V 150ML (SYRINGE) ×3 IMPLANT
TUBING CONTRAST HIGH PRESS 72 (TUBING) ×3 IMPLANT
VALVE HEMO TOUHY BORST Y (VALVE) ×3 IMPLANT
WIRE G V18X300CM (WIRE) ×3 IMPLANT
WIRE J 3MM .035X145CM (WIRE) ×3 IMPLANT

## 2015-05-14 NOTE — Op Note (Signed)
[facility] Percutaneous Study/Intervention Procedural Note   Date of Surgery: 05/14/2015  Surgeon:  Katha Cabal, MD.  Pre-operative Diagnosis: Atherosclerotic occlusive disease with gangrene of the right forefoot  Post-operative diagnosis: Same  Procedure(s) Performed: 1. Introduction catheter into right lower extremity 3rd order catheter placement  2. Contrast injection right lower extremity for distal runoff   3. Percutaneous transluminal angioplasty right common femoral artery 6 mm 4. Percutaneous transluminal angioplasty of the right external iliac artery to 7 mm             5.  Percutaneous transluminal angioplasty of the right peroneal artery to 2 mm  Anesthesia: Conscious sedation with IV Versed and fentanyl  Sheath: 6 [place] Ansell left common femoral  Contrast: 80 cc  Fluoroscopy Time: 10.8 minutes  Indications: Shelley Fields presents with gangrene of the right foot. Physical examination as well as noninvasive studies demonstrate severe atherosclerotic occlusive disease. She is therefore undergoing angiography with the hope for intervention for limb salvage. The risks and benefits are reviewed all questions answered patient agrees to proceed.  Procedure: Shelley Fields is a 79 y.o. y.o. female who was identified and appropriate procedural time out was performed. The patient was then placed supine on the table and prepped and draped in the usual sterile fashion.   Ultrasound was placed in the sterile sleeve and the left groin was evaluated the left common femoral artery was echolucent and pulsatile indicating patency.  Image was recorded for the permanent record and under real-time visualization a microneedle was inserted into the common femoral artery microwire followed by a micro-sheath.  A J-wire was then advanced through the micro-sheath and a  5 [place] sheath was then inserted over a  J-wire.  A 5 French pigtail catheter was positioned to above the bifurcation and a LAO view of the pelvis was obtained.  Subsequently a pigtail catheter with the stiff angle Glidewire was used to cross the aortic bifurcation the catheter wire were advanced down into the right distal external iliac artery. Oblique view of the femoral bifurcation was then obtained and subsequently the wire was reintroduced and the pigtail catheter negotiated into the SFA representing third order catheter placement. Distal runoff was then performed.  4000 units of heparin was then given and allowed to circulate and a 6 [place] Ansell sheath was advanced up and over the bifurcation and positioned in the femoral artery  Straight  catheter and stiff angle Glidewire were then negotiated down into the distal popliteal.  Distal runoff was then completed by hand injection through the catheter. The wire was then reintroduced and negotiated into the peroneal.  A 2 mm x 22 cm balloon was used to angioplasty the peroneal artery. Inflation were to 16 atmospheres for 2 minutes. Follow-up imaging demonstrated patency with adequate luminal gain.  The detector was then positioned over the common femoral and LAO magnified image was obtained subsequently a 5 x 4 Lutonix balloon was used to angioplasty the common femoral. Inflation was for approximately 12 atmospheres. Subsequently a 6 x 4 balloon was used to angioplasty the common femoral again the inflation was to 12 atm for approximately 1.5 minutes. Follow-up imaging demonstrated excellent luminal gain attention was turned to the lesion in the right external iliac artery.   The detector was then repositioned and a magnified image of the external iliac was obtained the lesion was identified and subsequently a 7 x 4 balloon was advanced over the wire and used to dilate  the stenosis. Inflation was to 12 atm for proximal 1 minute. Follow-up imaging demonstrated an excellent result with little  residual stenosis.  Distal runoff was then reassessed.  After review of these images the sheath is pulled into the left external iliac oblique of the common femoral is obtained and manual pressure was utilized. There no immediate Complications.  Findings: Multilevel stenosis including in-stent restenosis of the external iliac a greater than 80% stenosis of the common femoral and its proximal 2 cm severe tibial disease with diffuse disease in the peroneal and occlusion throughout the entire length of the posterior tibial and the majority the anterior tibial. Following and plasty at 2 mm there is improvement within the peroneal following angioplasty to 6 mm the common femoral is markedly improved and following and plasty to 7 mm within the external iliac there is complete resolution of the in-stent restenosis   Disposition: Patient was taken to the recovery room in stable condition having tolerated the procedure well.  Schnier, Dolores Lory 03/12/2015,3:14 PM

## 2015-05-14 NOTE — H&P (Signed)
[facility] History & Physical Update  The patient was interviewed and re-examined.  The patient's previous History and Physical has been reviewed and is unchanged.  There is no change in the plan of care. We plan to proceed with the scheduled procedure.  Schnier, Dolores Lory, MD  05/14/2015, 8:20 AM

## 2015-05-15 ENCOUNTER — Inpatient Hospital Stay
Admit: 2015-05-15 | Discharge: 2015-05-15 | Disposition: A | Discharge status: Home / Self Care | Payer: Medicare Other | Attending: Internal Medicine | Admitting: Internal Medicine

## 2015-05-15 ENCOUNTER — Encounter: Payer: Self-pay | Admitting: Vascular Surgery

## 2015-05-15 ENCOUNTER — Inpatient Hospital Stay
Admission: EM | Admit: 2015-05-15 | Discharge: 2015-05-24 | DRG: 252 | Disposition: A | Discharge status: Skilled Nursing Facility | Payer: Medicare Other | Attending: Internal Medicine | Admitting: Internal Medicine

## 2015-05-15 ENCOUNTER — Emergency Department: Payer: Medicare Other

## 2015-05-15 DIAGNOSIS — I1310 Hypertensive heart and chronic kidney disease without heart failure, with stage 1 through stage 4 chronic kidney disease, or unspecified chronic kidney disease: Secondary | ICD-10-CM | POA: Diagnosis present

## 2015-05-15 DIAGNOSIS — Z923 Personal history of irradiation: Secondary | ICD-10-CM | POA: Diagnosis not present

## 2015-05-15 DIAGNOSIS — Z9842 Cataract extraction status, left eye: Secondary | ICD-10-CM | POA: Diagnosis not present

## 2015-05-15 DIAGNOSIS — R0902 Hypoxemia: Secondary | ICD-10-CM | POA: Diagnosis present

## 2015-05-15 DIAGNOSIS — Z8541 Personal history of malignant neoplasm of cervix uteri: Secondary | ICD-10-CM

## 2015-05-15 DIAGNOSIS — E1122 Type 2 diabetes mellitus with diabetic chronic kidney disease: Secondary | ICD-10-CM | POA: Diagnosis present

## 2015-05-15 DIAGNOSIS — Z66 Do not resuscitate: Secondary | ICD-10-CM | POA: Diagnosis present

## 2015-05-15 DIAGNOSIS — T508X5A Adverse effect of diagnostic agents, initial encounter: Secondary | ICD-10-CM | POA: Diagnosis present

## 2015-05-15 DIAGNOSIS — K219 Gastro-esophageal reflux disease without esophagitis: Secondary | ICD-10-CM | POA: Diagnosis present

## 2015-05-15 DIAGNOSIS — Z9861 Coronary angioplasty status: Secondary | ICD-10-CM

## 2015-05-15 DIAGNOSIS — Z79899 Other long term (current) drug therapy: Secondary | ICD-10-CM | POA: Diagnosis not present

## 2015-05-15 DIAGNOSIS — N141 Nephropathy induced by other drugs, medicaments and biological substances: Secondary | ICD-10-CM | POA: Diagnosis present

## 2015-05-15 DIAGNOSIS — Z91041 Radiographic dye allergy status: Secondary | ICD-10-CM

## 2015-05-15 DIAGNOSIS — L89150 Pressure ulcer of sacral region, unstageable: Secondary | ICD-10-CM | POA: Diagnosis present

## 2015-05-15 DIAGNOSIS — D631 Anemia in chronic kidney disease: Secondary | ICD-10-CM | POA: Diagnosis present

## 2015-05-15 DIAGNOSIS — N179 Acute kidney failure, unspecified: Secondary | ICD-10-CM | POA: Diagnosis present

## 2015-05-15 DIAGNOSIS — E1165 Type 2 diabetes mellitus with hyperglycemia: Secondary | ICD-10-CM | POA: Diagnosis present

## 2015-05-15 DIAGNOSIS — E1152 Type 2 diabetes mellitus with diabetic peripheral angiopathy with gangrene: Secondary | ICD-10-CM | POA: Diagnosis present

## 2015-05-15 DIAGNOSIS — I214 Non-ST elevation (NSTEMI) myocardial infarction: Secondary | ICD-10-CM | POA: Diagnosis present

## 2015-05-15 DIAGNOSIS — E7800 Pure hypercholesterolemia, unspecified: Secondary | ICD-10-CM | POA: Diagnosis present

## 2015-05-15 DIAGNOSIS — Z7982 Long term (current) use of aspirin: Secondary | ICD-10-CM

## 2015-05-15 DIAGNOSIS — Z90710 Acquired absence of both cervix and uterus: Secondary | ICD-10-CM

## 2015-05-15 DIAGNOSIS — Z8249 Family history of ischemic heart disease and other diseases of the circulatory system: Secondary | ICD-10-CM

## 2015-05-15 DIAGNOSIS — M1990 Unspecified osteoarthritis, unspecified site: Secondary | ICD-10-CM | POA: Diagnosis present

## 2015-05-15 DIAGNOSIS — N184 Chronic kidney disease, stage 4 (severe): Secondary | ICD-10-CM

## 2015-05-15 DIAGNOSIS — B379 Candidiasis, unspecified: Secondary | ICD-10-CM | POA: Diagnosis present

## 2015-05-15 DIAGNOSIS — R0600 Dyspnea, unspecified: Secondary | ICD-10-CM

## 2015-05-15 DIAGNOSIS — E11649 Type 2 diabetes mellitus with hypoglycemia without coma: Secondary | ICD-10-CM | POA: Diagnosis present

## 2015-05-15 DIAGNOSIS — Z90722 Acquired absence of ovaries, bilateral: Secondary | ICD-10-CM

## 2015-05-15 DIAGNOSIS — Z8551 Personal history of malignant neoplasm of bladder: Secondary | ICD-10-CM | POA: Diagnosis not present

## 2015-05-15 DIAGNOSIS — N183 Chronic kidney disease, stage 3 (moderate): Secondary | ICD-10-CM | POA: Diagnosis present

## 2015-05-15 DIAGNOSIS — J811 Chronic pulmonary edema: Secondary | ICD-10-CM

## 2015-05-15 DIAGNOSIS — Z7901 Long term (current) use of anticoagulants: Secondary | ICD-10-CM | POA: Diagnosis not present

## 2015-05-15 DIAGNOSIS — Z86711 Personal history of pulmonary embolism: Secondary | ICD-10-CM | POA: Diagnosis not present

## 2015-05-15 DIAGNOSIS — L89319 Pressure ulcer of right buttock, unspecified stage: Secondary | ICD-10-CM | POA: Diagnosis present

## 2015-05-15 DIAGNOSIS — I739 Peripheral vascular disease, unspecified: Secondary | ICD-10-CM | POA: Diagnosis present

## 2015-05-15 DIAGNOSIS — Z9841 Cataract extraction status, right eye: Secondary | ICD-10-CM | POA: Diagnosis not present

## 2015-05-15 DIAGNOSIS — Z9889 Other specified postprocedural states: Secondary | ICD-10-CM | POA: Diagnosis not present

## 2015-05-15 DIAGNOSIS — Z794 Long term (current) use of insulin: Secondary | ICD-10-CM | POA: Diagnosis not present

## 2015-05-15 DIAGNOSIS — J810 Acute pulmonary edema: Secondary | ICD-10-CM | POA: Diagnosis present

## 2015-05-15 DIAGNOSIS — I5033 Acute on chronic diastolic (congestive) heart failure: Secondary | ICD-10-CM | POA: Diagnosis present

## 2015-05-15 DIAGNOSIS — I2699 Other pulmonary embolism without acute cor pulmonale: Secondary | ICD-10-CM | POA: Diagnosis present

## 2015-05-15 DIAGNOSIS — I129 Hypertensive chronic kidney disease with stage 1 through stage 4 chronic kidney disease, or unspecified chronic kidney disease: Secondary | ICD-10-CM | POA: Diagnosis present

## 2015-05-15 DIAGNOSIS — I255 Ischemic cardiomyopathy: Secondary | ICD-10-CM | POA: Diagnosis present

## 2015-05-15 DIAGNOSIS — E785 Hyperlipidemia, unspecified: Secondary | ICD-10-CM | POA: Diagnosis present

## 2015-05-15 DIAGNOSIS — Z833 Family history of diabetes mellitus: Secondary | ICD-10-CM | POA: Diagnosis not present

## 2015-05-15 DIAGNOSIS — F0390 Unspecified dementia without behavioral disturbance: Secondary | ICD-10-CM | POA: Diagnosis present

## 2015-05-15 DIAGNOSIS — R739 Hyperglycemia, unspecified: Secondary | ICD-10-CM

## 2015-05-15 DIAGNOSIS — Z8542 Personal history of malignant neoplasm of other parts of uterus: Secondary | ICD-10-CM

## 2015-05-15 DIAGNOSIS — I5022 Chronic systolic (congestive) heart failure: Secondary | ICD-10-CM

## 2015-05-15 LAB — COMPREHENSIVE METABOLIC PANEL
ALBUMIN: 2.7 g/dL — AB (ref 3.5–5.0)
ALT: 62 U/L — AB (ref 14–54)
ANION GAP: 12 (ref 5–15)
AST: 105 U/L — ABNORMAL HIGH (ref 15–41)
Alkaline Phosphatase: 32 U/L — ABNORMAL LOW (ref 38–126)
BUN: 82 mg/dL — ABNORMAL HIGH (ref 6–20)
CHLORIDE: 109 mmol/L (ref 101–111)
CO2: 18 mmol/L — AB (ref 22–32)
Calcium: 8.2 mg/dL — ABNORMAL LOW (ref 8.9–10.3)
Creatinine, Ser: 2.18 mg/dL — ABNORMAL HIGH (ref 0.44–1.00)
GFR calc non Af Amer: 21 mL/min — ABNORMAL LOW (ref >60)
GFR, EST AFRICAN AMERICAN: 24 mL/min — AB (ref >60)
Glucose, Bld: 556 mg/dL (ref 65–99)
Potassium: 4.3 mmol/L (ref 3.5–5.1)
SODIUM: 139 mmol/L (ref 135–145)
Total Bilirubin: 0.2 mg/dL — ABNORMAL LOW (ref 0.3–1.2)
Total Protein: 7.5 g/dL (ref 6.5–8.1)

## 2015-05-15 LAB — APTT: APTT: 48 s — AB (ref 24–36)

## 2015-05-15 LAB — BRAIN NATRIURETIC PEPTIDE: B NATRIURETIC PEPTIDE 5: 2049 pg/mL — AB (ref 0.0–100.0)

## 2015-05-15 LAB — PROTIME-INR
INR: 1.62
Prothrombin Time: 19.3 seconds — ABNORMAL HIGH (ref 11.4–15.0)

## 2015-05-15 LAB — CBC WITH DIFFERENTIAL/PLATELET
BASOS ABS: 0 10*3/uL (ref 0–0.1)
BASOS PCT: 0 %
EOS ABS: 0 10*3/uL (ref 0–0.7)
Eosinophils Relative: 0 %
HCT: 26.8 % — ABNORMAL LOW (ref 35.0–47.0)
HEMOGLOBIN: 8.2 g/dL — AB (ref 12.0–16.0)
LYMPHS ABS: 1.1 10*3/uL (ref 1.0–3.6)
Lymphocytes Relative: 7 %
MCH: 25.9 pg — ABNORMAL LOW (ref 26.0–34.0)
MCHC: 30.6 g/dL — ABNORMAL LOW (ref 32.0–36.0)
MCV: 84.7 fL (ref 80.0–100.0)
Monocytes Absolute: 1 10*3/uL — ABNORMAL HIGH (ref 0.2–0.9)
Monocytes Relative: 6 %
NEUTROS PCT: 87 %
Neutro Abs: 12.9 10*3/uL — ABNORMAL HIGH (ref 1.4–6.5)
Platelets: 620 10*3/uL — ABNORMAL HIGH (ref 150–440)
RBC: 3.16 MIL/uL — AB (ref 3.80–5.20)
RDW: 18.6 % — ABNORMAL HIGH (ref 11.5–14.5)
WBC: 15 10*3/uL — AB (ref 3.6–11.0)

## 2015-05-15 LAB — TROPONIN I
TROPONIN I: 14.68 ng/mL — AB (ref <0.031)
Troponin I: 15.48 ng/mL — ABNORMAL HIGH (ref <0.031)

## 2015-05-15 LAB — LACTIC ACID, PLASMA
LACTIC ACID, VENOUS: 3.1 mmol/L — AB (ref 0.5–2.0)
Lactic Acid, Venous: 2 mmol/L (ref 0.5–2.0)

## 2015-05-15 LAB — GLUCOSE, CAPILLARY
GLUCOSE-CAPILLARY: 293 mg/dL — AB (ref 65–99)
Glucose-Capillary: 413 mg/dL — ABNORMAL HIGH (ref 65–99)

## 2015-05-15 MED ORDER — INSULIN DETEMIR 100 UNIT/ML ~~LOC~~ SOLN
10.0000 [IU] | Freq: Every day | SUBCUTANEOUS | Status: DC
  Administered 2015-05-16: 10 [IU] via SUBCUTANEOUS
  Filled 2015-05-15 (×2): qty 0.1

## 2015-05-15 MED ORDER — TAMSULOSIN HCL 0.4 MG PO CAPS
0.4000 mg | ORAL_CAPSULE | Freq: Two times a day (BID) | ORAL | Status: DC
  Administered 2015-05-16 – 2015-05-22 (×14): 0.4 mg via ORAL
  Filled 2015-05-15 (×14): qty 1

## 2015-05-15 MED ORDER — HEPARIN (PORCINE) IN NACL 100-0.45 UNIT/ML-% IJ SOLN
12.0000 [IU]/kg/h | INTRAMUSCULAR | Status: DC
  Administered 2015-05-15: 12 [IU]/kg/h via INTRAVENOUS
  Filled 2015-05-15: qty 250

## 2015-05-15 MED ORDER — PROMETHAZINE HCL 25 MG PO TABS
25.0000 mg | ORAL_TABLET | Freq: Three times a day (TID) | ORAL | Status: DC | PRN
  Administered 2015-05-21: 25 mg via ORAL
  Filled 2015-05-15: qty 1

## 2015-05-15 MED ORDER — MAGNESIUM OXIDE 400 (241.3 MG) MG PO TABS
400.0000 mg | ORAL_TABLET | Freq: Every day | ORAL | Status: DC
  Administered 2015-05-16 – 2015-05-24 (×9): 400 mg via ORAL
  Filled 2015-05-15 (×9): qty 1

## 2015-05-15 MED ORDER — PIPERACILLIN-TAZOBACTAM 3.375 G IVPB
3.3750 g | Freq: Once | INTRAVENOUS | Status: AC
  Administered 2015-05-15: 3.375 g via INTRAVENOUS
  Filled 2015-05-15: qty 50

## 2015-05-15 MED ORDER — ATENOLOL 25 MG PO TABS
50.0000 mg | ORAL_TABLET | Freq: Every day | ORAL | Status: DC
  Administered 2015-05-16 – 2015-05-20 (×5): 50 mg via ORAL
  Filled 2015-05-15 (×6): qty 2

## 2015-05-15 MED ORDER — FUROSEMIDE 10 MG/ML IJ SOLN
20.0000 mg | Freq: Three times a day (TID) | INTRAMUSCULAR | Status: DC
  Administered 2015-05-16 – 2015-05-19 (×11): 20 mg via INTRAVENOUS
  Filled 2015-05-15 (×11): qty 2

## 2015-05-15 MED ORDER — MIRTAZAPINE 15 MG PO TABS
15.0000 mg | ORAL_TABLET | Freq: Every day | ORAL | Status: DC
  Administered 2015-05-16 – 2015-05-23 (×8): 15 mg via ORAL
  Filled 2015-05-15 (×9): qty 1

## 2015-05-15 MED ORDER — HEPARIN (PORCINE) IN NACL 100-0.45 UNIT/ML-% IJ SOLN: 12.0000 [IU]/kg/h | Freq: Once | INTRAMUSCULAR | Status: DC

## 2015-05-15 MED ORDER — INSULIN ASPART 100 UNIT/ML ~~LOC~~ SOLN
10.0000 [IU] | Freq: Once | SUBCUTANEOUS | Status: AC
  Administered 2015-05-15: 10 [IU] via SUBCUTANEOUS
  Filled 2015-05-15: qty 10

## 2015-05-15 MED ORDER — SODIUM CHLORIDE 0.9 % IJ SOLN
3.0000 mL | Freq: Two times a day (BID) | INTRAMUSCULAR | Status: DC
  Administered 2015-05-16 – 2015-05-24 (×17): 3 mL via INTRAVENOUS

## 2015-05-15 MED ORDER — HEPARIN SODIUM (PORCINE) 5000 UNIT/ML IJ SOLN
4000.0000 [IU] | Freq: Once | INTRAMUSCULAR | Status: DC
  Filled 2015-05-15: qty 1

## 2015-05-15 MED ORDER — PRAVASTATIN SODIUM 20 MG PO TABS
20.0000 mg | ORAL_TABLET | Freq: Every day | ORAL | Status: DC
  Administered 2015-05-16 – 2015-05-21 (×5): 20 mg via ORAL
  Filled 2015-05-15 (×5): qty 1

## 2015-05-15 MED ORDER — OXYCODONE HCL 5 MG PO TABS
15.0000 mg | ORAL_TABLET | ORAL | Status: DC | PRN
  Administered 2015-05-16 – 2015-05-23 (×18): 15 mg via ORAL
  Filled 2015-05-15 (×19): qty 3

## 2015-05-15 MED ORDER — AMLODIPINE BESYLATE 5 MG PO TABS
5.0000 mg | ORAL_TABLET | Freq: Every day | ORAL | Status: DC
  Administered 2015-05-16 – 2015-05-21 (×6): 5 mg via ORAL
  Filled 2015-05-15 (×6): qty 1

## 2015-05-15 MED ORDER — FUROSEMIDE 10 MG/ML IJ SOLN
40.0000 mg | Freq: Once | INTRAMUSCULAR | Status: AC
  Administered 2015-05-15: 40 mg via INTRAVENOUS
  Filled 2015-05-15: qty 4

## 2015-05-15 MED ORDER — INSULIN ASPART 100 UNIT/ML ~~LOC~~ SOLN
0.0000 [IU] | Freq: Three times a day (TID) | SUBCUTANEOUS | Status: DC
  Administered 2015-05-16: 2 [IU] via SUBCUTANEOUS
  Administered 2015-05-16: 5 [IU] via SUBCUTANEOUS
  Filled 2015-05-15: qty 2
  Filled 2015-05-15: qty 5

## 2015-05-15 MED ORDER — KETOCONAZOLE 2 % EX CREA
1.0000 "application " | TOPICAL_CREAM | Freq: Two times a day (BID) | CUTANEOUS | Status: DC
  Administered 2015-05-16 – 2015-05-24 (×15): 1 via TOPICAL
  Filled 2015-05-15: qty 15

## 2015-05-15 MED ORDER — ASPIRIN EC 81 MG PO TBEC
81.0000 mg | DELAYED_RELEASE_TABLET | Freq: Every day | ORAL | Status: DC
  Administered 2015-05-16 – 2015-05-24 (×9): 81 mg via ORAL
  Filled 2015-05-15 (×9): qty 1

## 2015-05-15 MED ORDER — PANTOPRAZOLE SODIUM 40 MG PO TBEC
40.0000 mg | DELAYED_RELEASE_TABLET | Freq: Every day | ORAL | Status: DC
  Administered 2015-05-16 – 2015-05-24 (×9): 40 mg via ORAL
  Filled 2015-05-15 (×9): qty 1

## 2015-05-15 NOTE — ED Provider Notes (Signed)
[facility] Emergency Department Provider Note     Time seen: ----------------------------------------- 3:59 PM on 05/15/2015 -----------------------------------------    I have reviewed the triage vital signs and the nursing notes.   HISTORY  Chief Complaint Shortness of Breath    HPI Shelley Fields is a 79 y.o. female who presents ER being brought from [facility] care with increased confusion and labored breathing. The staff [facility] states patient has been increasingly confused, had angioplasty yesterday on her right foot to improve right foot circulation. On arrival she is yelling "water, I need water." She is disoriented to time but otherwise is alert and oriented, she does have an chronic indwelling Foley, she's not normally on oxygen but EMS placed on 4 L.   Past Medical History  Diagnosis Date  . Diabetes mellitus without complication ([place])   . C. difficile diarrhea   . Uterine cancer ([place])   . Hypogammaglobulinemia ([place])   . Pulmonary embolism ([place])   . Decubitus ulcers   . Hypertension   . Hypercholesterolemia   . Incontinence of urine   . Cancer [facility])     Endometrial Cancer  . Cervical cancer ([place])     with radiation 45 years ago...  . Arthritis   . GERD (gastroesophageal reflux disease)   . Bladder cancer ([place])   . Decubitus ulcer of buttock, stage 1   . Peripheral vascular disease ([place])   . Anemia     Patient Active Problem List   Diagnosis Date Noted  . Chronic anticoagulation 04/21/2015  . B12 deficiency 03/18/2015  . Anemia 02/17/2015  . Weight loss 02/17/2015  . Severe anemia 12/12/2014  . GI bleed 12/12/2014  . DM (diabetes mellitus), type 2, uncontrolled ([place]) 12/12/2014  . Diabetes mellitus with hyperglycemia ([place]) 12/12/2014  . Leucocytosis 12/12/2014  . UTI (urinary tract infection) 12/12/2014  . Decubitus ulcer of back, stage 2 12/12/2014  . HTN (hypertension) 12/12/2014  . Anorexia 11/23/2014   . Encounter for other specified administrative purpose 07/10/2013  . Referral of patient 07/10/2013  . Candidal vulvovaginitis 10/14/2012  . Candida vaginitis 10/14/2012  . Polypharmacy 06/23/2012  . Long term current use of opiate analgesic 06/23/2012  . Breast lump 06/10/2012  . Laboratory examination 04/12/2012  . Diagnostic skin and sensitization tests 04/12/2012  . Bladder infection, chronic 03/23/2012  . Total urinary incontinence 03/23/2012  . 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 Female genuine stress incontinence 03/23/2012  . Disorder of bladder function 03/23/2012  . Incomplete bladder emptying 03/23/2012  . 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 Cystitis, radiation 03/23/2012  . Cancer of body of uterus ([place]) 03/23/2012  . Cancer of corpus uteri, except isthmus ([place]) 03/23/2012  . Bladder neoplasm of uncertain malignant potential 03/23/2012  . Infection of urinary tract 03/23/2012  . Colon polyp 01/06/2012  . Difficulty in walking 01/06/2012  . Diabetes ([place]) 01/06/2012  . Deep vein thrombosis ([place]) 01/06/2012  . Cancer of endometrium ([place]) 01/06/2012  . Contact with and exposure to tuberculosis 01/06/2012  . BP (high blood pressure) 01/06/2012  . Angiopathy, peripheral ([place]) 01/06/2012  . Abnormal presence of protein in urine 01/06/2012  . Retinopathy 01/06/2012    Past Surgical History  Procedure Laterality Date  . Total abdominal hysterectomy w/ bilateral salpingoophorectomy  07/29/2010  . Stent in leg  2016  . Cataract extraction, bilateral    . Bladder surgery    . Femoral endarterectomy      right  . Leg surgery  right leg  . Portacath placement      right  . Esophagogastroduodenoscopy N/A 12/14/2014    Procedure: ESOPHAGOGASTRODUODENOSCOPY (EGD);  Surgeon: Hulen Luster, MD;  Location: [facility] ENDOSCOPY;  Service: Endoscopy;  Laterality: N/A;  . Peripheral vascular catheterization Right 04/05/2015    Procedure: Lower Extremity Angiography;  Surgeon:  Katha Cabal, MD;  Location: [facility] CV LAB;  Service: Cardiovascular;  Laterality: Right;  . Peripheral vascular catheterization  04/05/2015    Procedure: Lower Extremity Intervention;  Surgeon: Katha Cabal, MD;  Location: [facility] CV LAB;  Service: Cardiovascular;;  . Peripheral vascular catheterization Right 05/14/2015    Procedure: Lower Extremity Angiography;  Surgeon: Katha Cabal, MD;  Location: [facility] LAB;  Service: Cardiovascular;  Laterality: Right;  . Peripheral vascular catheterization Right 05/14/2015    Procedure: Lower Extremity Intervention;  Surgeon: Katha Cabal, MD;  Location: [facility] [facility] LAB;  Service: Cardiovascular;  Laterality: Right;  . Angioplasty      Allergies Contrast media  Social History Social History  Substance Use Topics  . Smoking status: Never Smoker   . Smokeless tobacco: Not on file  . Alcohol Use: No    Review of Systems Constitutional: Negative for fever. Eyes: Negative for visual changes. ENT: Negative for sore throat. Positive for thirst Cardiovascular: Negative for chest pain. Respiratory: Positive for shortness of breath Gastrointestinal: Negative for abdominal pain, vomiting and diarrhea. Genitourinary: Positive for chronic indwelling Foley catheter. Musculoskeletal: Negative for back pain. Skin: Positive for discolored toes right foot Neurological: Positive for confusion  10-point ROS otherwise negative.  ____________________________________________   PHYSICAL EXAM:  VITAL SIGNS: ED Triage Vitals  Enc Vitals Group     BP 05/15/15 1553 149/76 mmHg     Pulse Rate 05/15/15 1553 109     Resp 05/15/15 1553 35     Temp 05/15/15 1553 98 F (36.7 C)     Temp Source 05/15/15 1553 Axillary     SpO2 05/15/15 1553 91 %     Weight 05/15/15 1553 166 lb 7.2 oz (75.5 kg)     Height 05/15/15 1553 5' (1.524 m)     Head Cir --      Peak Flow --      Pain Score 05/15/15 1557 0     Pain Loc --       Pain Edu? --      Excl. in [place]? --     Constitutional: Alert but mildly disoriented. Anxious, in mild distress. Eyes: Conjunctivae are normal. PERRL. Normal extraocular movements. ENT   Head: Normocephalic and atraumatic.   Nose: No congestion/rhinnorhea.   Mouth/Throat: Mucous membranes are moist.   Neck: No stridor. Cardiovascular: Rapid rate, regular rhythm. Poor pulses in the right lower extremity. Respiratory: Normal respiratory effort without tachypnea nor retractions. Wheezing with rales on the right Gastrointestinal: Soft and nontender. Distention, No abdominal bruits.  Musculoskeletal: Right foot with epidermal tissue loss distally, black toes. Poor pulses right foot Neurologic: No gross focal neurologic deficits are appreciated. Patient incessantly asking for water. Skin:  Hyperemic right foot, black toes. Epidermal tissue loss in the right foot distally. Adequate dopplerable pulses to the right foot. Psychiatric: Anxious mood and affect ____________________________________________  EKG: Interpreted by me. Sinus tachycardia with rate of 110 bpm, normal PR interval, normal curious with, normal QT interval. Multiple PVCs and nonspecific ST and T-wave changes.  ____________________________________________  ED COURSE:  Pertinent labs & imaging results that were available during my care of  the patient were reviewed by me and considered in my medical decision making (see chart for details). Patient appears chronically ill, will check a sick labs, chest x-ray and reevaluate. Unclear etiology for the dyspnea and confusion at this time. ____________________________________________    LABS (pertinent positives/negatives)  Labs Reviewed  CBC WITH DIFFERENTIAL/PLATELET - Abnormal; Notable for the following:    WBC 15.0 (*)    RBC 3.16 (*)    Hemoglobin 8.2 (*)    HCT 26.8 (*)    MCH 25.9 (*)    MCHC 30.6 (*)    RDW 18.6 (*)    Platelets 620 (*)    Neutro Abs  12.9 (*)    Monocytes Absolute 1.0 (*)    All other components within normal limits  BRAIN NATRIURETIC PEPTIDE - Abnormal; Notable for the following:    B Natriuretic Peptide 2049.0 (*)    All other components within normal limits  TROPONIN I - Abnormal; Notable for the following:    Troponin I 14.68 (*)    All other components within normal limits  BLOOD GAS, VENOUS - Abnormal; Notable for the following:    pCO2, Ven 36 (*)    Bicarbonate 18.5 (*)    Acid-base deficit 6.9 (*)    All other components within normal limits  COMPREHENSIVE METABOLIC PANEL - Abnormal; Notable for the following:    CO2 18 (*)    Glucose, Bld 556 (*)    BUN 82 (*)    Creatinine, Ser 2.18 (*)    Calcium 8.2 (*)    Albumin 2.7 (*)    AST 105 (*)    ALT 62 (*)    Alkaline Phosphatase 32 (*)    Total Bilirubin 0.2 (*)    GFR calc non Af Amer 21 (*)    GFR calc Af Amer 24 (*)    All other components within normal limits  PROTIME-INR - Abnormal; Notable for the following:    Prothrombin Time 19.3 (*)    All other components within normal limits  CULTURE, BLOOD (ROUTINE X 2)  CULTURE, BLOOD (ROUTINE X 2)  LACTIC ACID, PLASMA  LACTIC ACID, PLASMA  APTT   CRITICAL CARE Performed by: Earleen Newport   Total critical care time: 30 minutes  Critical care time was exclusive of separately billable procedures and treating other patients.  Critical care was necessary to treat or prevent imminent or life-threatening deterioration.  Critical care was time spent personally by me on the following activities: development of treatment plan with patient and/or surrogate as well as nursing, discussions with consultants, evaluation of patient's response to treatment, examination of patient, obtaining history from patient or surrogate, ordering and performing treatments and interventions, ordering and review of laboratory studies, ordering and review of radiographic studies, pulse oximetry and re-evaluation of  patient's condition.   RADIOLOGY Images were viewed by me  Chest x-ray IMPRESSION: Airspace consolidation in both lung bases as well as in the right mid lung. Underlying congestive heart failure. The airspace opacity may represent alveolar edema, although the appearance is concerning for superimposed pneumonia. Note that a nodular opacity in the right mid lung noted previously is obscured by infiltrate currently.____________________________________________  FINAL ASSESSMENT AND PLAN  Dyspnea, confusion, non-ST elevation MI, congestive heart failure, possible aspiration pneumonia, hyperglycemia, pulmonary edema  Plan: Patient with labs and imaging as dictated above. Patient presents with stable blood pressure but tachypnea. Troponin is markedly elevated, she'll be started on a heparin drip. Unclear etiology and that she  just had right lower extremity arteriogram yesterday and angioplasty. Prior to this point she has never had congestive heart failure although this is where her x-ray looks like. She's also been given IV Lasix 40 mg, IV Zosyn to cover for aspiration pneumonia and subcutaneous insulin to treat her hyperglycemia.   Earleen Newport, MD   Earleen Newport, MD 05/15/15 [id number]

## 2015-05-15 NOTE — Progress Notes (Signed)
ANTICOAGULATION CONSULT NOTE - Initial Consult  Pharmacy Consult for heparin drip dosing and monitoring Indication: chest pain/ACS  Allergies  Allergen Reactions  . Contrast Media [Iodinated Diagnostic Agents]     Patient Measurements: Height: 5' (152.4 cm) Weight: 166 lb 7.2 oz (75.5 kg) IBW/kg (Calculated) : 45.5 Heparin Dosing Weight: 62.5 kg   Vital Signs: Temp: 98 F (36.7 C) (12/07 1553) Temp Source: Axillary (12/07 1553) BP: 137/77 mmHg (12/07 1700) Pulse Rate: 91 (12/07 1700)  Labs:  Recent Labs  05/14/15 0727 05/15/15 1611  HGB  --  8.2*  HCT  --  26.8*  PLT  --  620*  LABPROT 20.4* 19.3*  INR 1.75 1.62  CREATININE 1.75* 2.18*  TROPONINI  --  14.68*    Estimated Creatinine Clearance: 19.6 mL/min (by C-G formula based on Cr of 2.18).   Medical History: Past Medical History  Diagnosis Date  . Diabetes mellitus without complication ([place])   . C. difficile diarrhea   . Uterine cancer ([place])   . Hypogammaglobulinemia ([place])   . Pulmonary embolism ([place])   . Decubitus ulcers   . Hypertension   . Hypercholesterolemia   . Incontinence of urine   . Cancer [facility])     Endometrial Cancer  . Cervical cancer ([place])     with radiation 45 years ago...  . Arthritis   . GERD (gastroesophageal reflux disease)   . Bladder cancer ([place])   . Decubitus ulcer of buttock, stage 1   . Peripheral vascular disease ([place])   . Anemia    Assessment: Pharmacy consulted to dose heparin in this 79 year old female presenting with elevated troponin on 14.68 with confusion and labored breathing. According the med rec, patient was taking warfarin prior to admission but unable to determine when last dose was given. INR has been ordered and resulted at 1.62.   Heparin dosing weight 62.5 kg  Goal of Therapy:  Heparin level 0.3-0.7 units/ml Monitor platelets by anticoagulation protocol: Yes   Plan:  No bolus since patient was on warfarin prior to admission but cannot determine time  of last dose Start heparin infusion at 750 units/hr Check anti-Xa level in 8 hours and daily while on heparin Continue to monitor H&H and platelets  Darylene Price Zakariya Knickerbocker 05/15/2015,5:32 PM

## 2015-05-15 NOTE — H&P (Signed)
[facility] NAME: Shelley Fields    MR#:  [id number]  DATE OF BIRTH:  07-19-1935  DATE OF ADMISSION:  05/15/2015  PRIMARY CARE PHYSICIAN: [name], MD   REQUESTING/REFERRING PHYSICIAN: Jimmye Norman  CHIEF COMPLAINT:   Chief Complaint  Patient presents with  . Shortness of Breath    HISTORY OF PRESENT ILLNESS: Shelley Fields  is a 79 y.o. female with a known history of diabetes, pulmonary embolism, decubitus ulcer, hypertension, hypercholesterolemia, uterine cancer with metastasis, peripheral arterial disease and was seen by vascular Dr.Schiner yesterday and had angioplasty done on her right lower extremity. She lives in nursing home because of her overall weakness and not able to take care of herself for last 6 month. Today she was sent back to emergency room because she was short of breath, in ER she was noted to have a troponin of 14, pulmonary edema on chest x-ray on lungs, elevated BNP level. ER physician spoke to cardiologist and he agreed with heparin drip admission over here. Patient denies any chest pain or cough or sputum production.  PAST MEDICAL HISTORY:   Past Medical History  Diagnosis Date  . Diabetes mellitus without complication ([place])   . C. difficile diarrhea   . Uterine cancer ([place])   . Hypogammaglobulinemia ([place])   . Pulmonary embolism ([place])   . Decubitus ulcers   . Hypertension   . Hypercholesterolemia   . Incontinence of urine   . Cancer [facility])     Endometrial Cancer  . Cervical cancer ([place])     with radiation 45 years ago...  . Arthritis   . GERD (gastroesophageal reflux disease)   . Bladder cancer ([place])   . Decubitus ulcer of buttock, stage 1   . Peripheral vascular disease ([place])   . Anemia     PAST SURGICAL HISTORY:  Past Surgical History  Procedure Laterality Date  . Total abdominal hysterectomy w/ bilateral salpingoophorectomy  07/29/2010  . Stent in leg  2016  . Cataract extraction,  bilateral    . Bladder surgery    . Femoral endarterectomy      right  . Leg surgery      right leg  . Portacath placement      right  . Esophagogastroduodenoscopy N/A 12/14/2014    Procedure: ESOPHAGOGASTRODUODENOSCOPY (EGD);  Surgeon: Hulen Luster, MD;  Location: [facility] ENDOSCOPY;  Service: Endoscopy;  Laterality: N/A;  . Peripheral vascular catheterization Right 04/05/2015    Procedure: Lower Extremity Angiography;  Surgeon: Katha Cabal, MD;  Location: [place] CV LAB;  Service: Cardiovascular;  Laterality: Right;  . Peripheral vascular catheterization  04/05/2015    Procedure: Lower Extremity Intervention;  Surgeon: Katha Cabal, MD;  Location: [place] CV LAB;  Service: Cardiovascular;;  . Peripheral vascular catheterization Right 05/14/2015    Procedure: Lower Extremity Angiography;  Surgeon: Katha Cabal, MD;  Location: [place] CV LAB;  Service: Cardiovascular;  Laterality: Right;  . Peripheral vascular catheterization Right 05/14/2015    Procedure: Lower Extremity Intervention;  Surgeon: Katha Cabal, MD;  Location: [place] CV LAB;  Service: Cardiovascular;  Laterality: Right;  . Angioplasty      SOCIAL HISTORY:  Social History  Substance Use Topics  . Smoking status: Never Smoker   . Smokeless tobacco: Not on file  . Alcohol Use: No    FAMILY HISTORY:  Family History  Problem Relation Age of Onset  . Hypertension Mother   .  Diabetes Mellitus II Mother   . Coronary artery disease Mother   . Hypertension Father   . Diabetes Mellitus II Father   . CAD Father     DRUG ALLERGIES:  Allergies  Allergen Reactions  . Contrast Media [Iodinated Diagnostic Agents]     REVIEW OF SYSTEMS:   CONSTITUTIONAL: No fever, fatigue or weakness.  EYES: No blurred or double vision.  EARS, NOSE, AND THROAT: No tinnitus or ear pain.  RESPIRATORY: No cough, positive for shortness of breath, no wheezing or hemoptysis.  CARDIOVASCULAR: No chest pain,  orthopnea, edema.  GASTROINTESTINAL: No nausea, vomiting, diarrhea or abdominal pain.  GENITOURINARY: No dysuria, hematuria.  ENDOCRINE: No polyuria, nocturia,  HEMATOLOGY: No anemia, easy bruising or bleeding SKIN: No rash or lesion. MUSCULOSKELETAL: No joint pain or arthritis.   NEUROLOGIC: No tingling, numbness, weakness.  PSYCHIATRY: No anxiety or depression.   MEDICATIONS AT HOME:  Prior to Admission medications   Medication Sig Start Date End Date Taking? Authorizing Provider  amLODipine (NORVASC) 5 MG tablet TAKE 1 TABLET BY MOUTH EVERY DAY 02/12/15   Arlis Porta., MD  aspirin 81 MG tablet Take 81 mg by mouth daily.    Historical Provider, MD  atenolol (TENORMIN) 50 MG tablet Take 50 mg by mouth daily. 10/24/14   Historical Provider, MD  benazepril (LOTENSIN) 40 MG tablet Take 40 mg by mouth daily. 11/01/14   Historical Provider, MD  cloNIDine (CATAPRES) 0.2 MG tablet Take 0.2 mg by mouth 2 (two) times daily. 10/24/14   Historical Provider, MD  furosemide (LASIX) 20 MG tablet Take 20 mg by mouth every other day. 10/24/14   Historical Provider, MD  HUMULIN 70/30 (70-30) 100 UNIT/ML injection 16 Units daily at 6 PM. 11/01/14   Historical Provider, MD  hydrALAZINE (APRESOLINE) 100 MG tablet Take 100 mg by mouth 2 (two) times daily. 10/24/14   Historical Provider, MD  insulin aspart (NOVOLOG) cartridge Inject into the skin 3 (three) times daily with meals.    Historical Provider, MD  ketoconazole (NIZORAL) 2 % cream Apply 1 application topically 2 (two) times daily.    Historical Provider, MD  lovastatin (MEVACOR) 20 MG tablet TAKE 1 TABLET BY MOUTH AT BEDTIME 01/28/15   Arlis Porta., MD  magnesium oxide (MAG-OX) 400 MG tablet Take 400 mg by mouth daily.    Historical Provider, MD  meclizine (ANTIVERT) 25 MG tablet Take 25 mg by mouth daily. 10/24/14   Historical Provider, MD  megestrol (MEGACE ES) 625 MG/5ML suspension Take 1.6 mLs (200 mg total) by mouth daily. to stimulate  appetite 11/23/14   Lequita Asal, MD  mirtazapine (REMERON) 15 MG tablet Take 15 mg by mouth at bedtime.  10/24/14   Historical Provider, MD  MORPHINE SULFATE PO Take 5 mg by mouth every 3 (three) hours as needed.    Historical Provider, MD  mupirocin ointment (BACTROBAN) 2 %  01/14/15   Historical Provider, MD  nystatin cream (MYCOSTATIN) Apply 1 application topically 2 (two) times daily. Apply to buttocks    Historical Provider, MD  omeprazole (PRILOSEC) 20 MG capsule Take 20 mg by mouth every other day.  08/24/14   Historical Provider, MD  oxyCODONE (ROXICODONE) 15 MG immediate release tablet Take 15 mg by mouth every 4 (four) hours as needed for pain.    Historical Provider, MD  potassium chloride (K-DUR) 10 MEQ tablet Take 1 tablet by mouth daily. 12/03/14   Historical Provider, MD  promethazine (PHENERGAN) 25 MG tablet  Take 25 mg by mouth every 8 (eight) hours as needed for nausea or vomiting.    Historical Provider, MD  SANTYL ointment  10/24/14   Historical Provider, MD  tamsulosin (FLOMAX) 0.4 MG CAPS capsule Take 0.4 mg by mouth 2 (two) times daily. 10/24/14   Historical Provider, MD  VITAMIN K, PHYTONADIONE, PO Take 1.25 mg by mouth 2 (two) times a week. Mon and friday    Historical Provider, MD  warfarin (COUMADIN) 1 MG tablet Take 1.5 mg by mouth daily.    Historical Provider, MD      PHYSICAL EXAMINATION:   VITAL SIGNS: Blood pressure 144/69, pulse 91, temperature 98 F (36.7 C), temperature source Axillary, resp. rate 28, height 5' (1.524 m), weight 75.5 kg (166 lb 7.2 oz), SpO2 98 %.  GENERAL:  79 y.o.-year-old patient lying in the bed with no acute distress.  EYES: Pupils equal, round, reactive to light and accommodation. No scleral icterus. Extraocular muscles intact.  HEENT: Head atraumatic, normocephalic. Oropharynx and nasopharynx clear.  NECK:  Supple, no jugular venous distention. No thyroid enlargement, no tenderness.  LUNGS: Normal breath sounds bilaterally, no  wheezing, mild crepitation. No use of accessory muscles of respiration.  CARDIOVASCULAR: S1, S2 normal. No murmurs, rubs, or gallops.  ABDOMEN: Soft, nontender, nondistended. Bowel sounds present. No organomegaly or mass.  EXTREMITIES: Left lower extremity pedal edema, cyanosis on her toes on the right side with blackening at the tip of the toes.  NEUROLOGIC: Cranial nerves II through XII are intact. Muscle strength 4/5 in all extremities weaker and lower extremities. Sensation intact. Gait not checked.  PSYCHIATRIC: The patient is alert and oriented x 3.  SKIN: No obvious rash, lesion, or ulcer.   LABORATORY PANEL:   CBC  Recent Labs Lab 05/15/15 1611  WBC 15.0*  HGB 8.2*  HCT 26.8*  PLT 620*  MCV 84.7  MCH 25.9*  MCHC 30.6*  RDW 18.6*  LYMPHSABS 1.1  MONOABS 1.0*  EOSABS 0.0  BASOSABS 0.0   ------------------------------------------------------------------------------------------------------------------  Chemistries   Recent Labs Lab 05/14/15 0727 05/15/15 1611  NA 137 139  K 4.1 4.3  CL 110 109  CO2 19* 18*  GLUCOSE 200* 556*  BUN 57* 82*  CREATININE 1.75* 2.18*  CALCIUM 8.0* 8.2*  AST  --  105*  ALT  --  62*  ALKPHOS  --  32*  BILITOT  --  0.2*   ------------------------------------------------------------------------------------------------------------------ estimated creatinine clearance is 19.6 mL/min (by C-G formula based on Cr of 2.18). ------------------------------------------------------------------------------------------------------------------ No results for input(s): TSH, T4TOTAL, T3FREE, THYROIDAB in the last 72 hours.  Invalid input(s): FREET3   Coagulation profile  Recent Labs Lab 05/14/15 0727 05/15/15 1611  INR 1.75 1.62   ------------------------------------------------------------------------------------------------------------------- No results for input(s): DDIMER in the last 72  hours. -------------------------------------------------------------------------------------------------------------------  Cardiac Enzymes  Recent Labs Lab 05/15/15 1611  TROPONINI 14.68*   ------------------------------------------------------------------------------------------------------------------ Invalid input(s): POCBNP  ---------------------------------------------------------------------------------------------------------------  Urinalysis    Component Value Date/Time   COLORURINE YELLOW* 12/12/2014 1955   COLORURINE Straw 10/03/2013 0850   APPEARANCEUR CLOUDY* 12/12/2014 1955   APPEARANCEUR Hazy 10/03/2013 0850   LABSPEC 1.024 12/12/2014 1955   LABSPEC 1.008 10/03/2013 0850   PHURINE 6.0 12/12/2014 1955   PHURINE 7.0 10/03/2013 0850   GLUCOSEU >500* 12/12/2014 1955   GLUCOSEU 50 mg/dL 10/03/2013 0850   HGBUR 2+* 12/12/2014 1955   HGBUR Negative 10/03/2013 0850   BILIRUBINUR NEGATIVE 12/12/2014 1955   BILIRUBINUR Negative 10/03/2013 Post* 12/12/2014 1955   KETONESUR  Negative 10/03/2013 0850   PROTEINUR 100* 12/12/2014 1955   PROTEINUR 100 mg/dL 10/03/2013 0850   NITRITE NEGATIVE 12/12/2014 1955   NITRITE Negative 10/03/2013 0850   LEUKOCYTESUR 3+* 12/12/2014 1955   LEUKOCYTESUR 3+ 10/03/2013 0850     RADIOLOGY: Dg Chest Port 1 View  05/15/2015  CLINICAL DATA:  Confusion. Difficulty breathing. History of endometrial carcinoma. EXAM: PORTABLE CHEST 1 VIEW COMPARISON:  December 12, 2014 FINDINGS: There is airspace consolidation throughout much of the right mid and lower lung zones. A lesser degree of infiltrate is noted in the left base. There is also underlying interstitial edema. Heart is slightly enlarged with pulmonary venous hypertension. No adenopathy. Port-A-Cath tip is in the superior vena cava. No pneumothorax. IMPRESSION: Airspace consolidation in both lung bases as well as in the right mid lung. Underlying congestive heart failure. The  airspace opacity may represent alveolar edema, although the appearance is concerning for superimposed pneumonia. Note that a nodular opacity in the right mid lung noted previously is obscured by infiltrate currently. Electronically Signed   By: Lowella Grip III M.D.   On: 05/15/2015 16:15    IMPRESSION AND PLAN:  * Non-ST elevation MI  Heparin IV drip started by ER, continue that. Aspirin, statin, check lipid panel, monitor on telemetry, check serial troponins.  Get echocardiogram and cardiology consult.  * Congestive heart failure  We do not have previous echocardiogram to check her ejection fraction. IV Lasix for now and get echocardiogram.  Intake and output measurement and close monitoring of renal function is she has worsening renal function also.  * Acute on chronic renal failure  Baseline creatinine was around 1.3 for 5 month ago  For now it is more than 2  Andre edema and CHF so she will need IV Lasix but will have coarse monitor renal function,: Nephrology consult.   Hold ACE inhibitor and try to maintain the blood pressures I'm holding other blood pressure medications also but continuing Lasix.  * History of pulmonary embolism  This is as per the previous records but patient is not aware about any kind of blood clots, though she was taking Coumadin at home.  Her INR is subtherapeutic which increases the chances of having another pulmonary emboli or blood clots also.  Due to compromised renal function really cannot get CT angiogram so I will get a V/Q scan tomorrow.  * Hyperglycemia with diabetes  She received 10 units of insulin injection in ER and with that her blood sugar level came down up to 400.  With compromised renal function I would like to give just 10 units of Levemir and keep her on sliding scale coverage.  * Hypertension  She is on multiple antihypertensive medications but her blood pressure is running between [id number] to [id number] systolic range,  So I will hold oral  medications but continue Lasix injection for CHF.    All the records are reviewed and case discussed with ED provider. Management plans discussed with the patient, family and they are in agreement.  CODE STATUS:DO NOT RESUSCITATE  Patient's healthcare power of attorney are her daughter and her sister her 2 sisters who are present in the room and I discussed in the presence patient did not want to have any artificial measures to keep her alive and she confirms a DO NOT RESUSCITATE status.   I explained about the finding and overall condition being very critical found her non-ST elevation MI and worsening renal function further complicating the treatment plan.  TOTAL TIME TAKING CARE OF THIS PATIENT: 60 critical care minutes.    Vaughan Basta M.D on 05/15/2015   Between 7am to 6pm - Pager - [phone]  After 6pm go to [email or web address] - password [email or web address] [facility]  Office  [phone]  CC: Primary care physician; [name], MD   Note: This dictation was prepared with Dragon dictation along with smaller phrase technology. Any transcriptional errors that result from this process are unintentional.

## 2015-05-15 NOTE — Progress Notes (Signed)
Dr Anselm Jungling notified of lactic acid level 3.1.  Labs to be repeated in AM.  No further orders at this time.

## 2015-05-15 NOTE — ED Notes (Signed)
Per EMS, patient is from [facility]. C/o increased confusion and labored breathing (belly breathing). Staff at [facility] states patient has been increasingly confused. Patient was seen yesterday for an angioplastly to right foot to improve circulation. Patient is currently yelling, "Water. I need water right now". Patient is A&O x3. Disoriented to time. Patient has a foley. Patient is normally not on O2 but EMS placed patient on 4L [place]. MD at bedside.

## 2015-05-16 ENCOUNTER — Inpatient Hospital Stay: Payer: Medicare Other

## 2015-05-16 LAB — CBC
HEMATOCRIT: 22.9 % — AB (ref 35.0–47.0)
Hemoglobin: 7.3 g/dL — ABNORMAL LOW (ref 12.0–16.0)
MCH: 26.8 pg (ref 26.0–34.0)
MCHC: 31.9 g/dL — AB (ref 32.0–36.0)
MCV: 84 fL (ref 80.0–100.0)
Platelets: 487 10*3/uL — ABNORMAL HIGH (ref 150–440)
RBC: 2.72 MIL/uL — ABNORMAL LOW (ref 3.80–5.20)
RDW: 18.3 % — AB (ref 11.5–14.5)
WBC: 12.9 10*3/uL — ABNORMAL HIGH (ref 3.6–11.0)

## 2015-05-16 LAB — BASIC METABOLIC PANEL
Anion gap: 10 (ref 5–15)
BUN: 82 mg/dL — AB (ref 6–20)
CALCIUM: 8.2 mg/dL — AB (ref 8.9–10.3)
CO2: 21 mmol/L — ABNORMAL LOW (ref 22–32)
Chloride: 112 mmol/L — ABNORMAL HIGH (ref 101–111)
Creatinine, Ser: 1.88 mg/dL — ABNORMAL HIGH (ref 0.44–1.00)
GFR calc Af Amer: 29 mL/min — ABNORMAL LOW (ref >60)
GFR, EST NON AFRICAN AMERICAN: 25 mL/min — AB (ref >60)
GLUCOSE: 316 mg/dL — AB (ref 65–99)
POTASSIUM: 3.6 mmol/L (ref 3.5–5.1)
Sodium: 143 mmol/L (ref 135–145)

## 2015-05-16 LAB — GLUCOSE, CAPILLARY
Glucose-Capillary: 157 mg/dL — ABNORMAL HIGH (ref 65–99)
Glucose-Capillary: 267 mg/dL — ABNORMAL HIGH (ref 65–99)

## 2015-05-16 LAB — BLOOD GAS, VENOUS
Acid-base deficit: 6.9 mmol/L — ABNORMAL HIGH (ref 0.0–2.0)
Bicarbonate: 18.5 mEq/L — ABNORMAL LOW (ref 21.0–28.0)
FIO2: 38
Patient temperature: 37
pCO2, Ven: 36 mmHg — ABNORMAL LOW (ref 44.0–60.0)
pH, Ven: 7.32 (ref 7.320–7.430)

## 2015-05-16 LAB — LIPID PANEL
CHOL/HDL RATIO: 5.5 ratio
Cholesterol: 131 mg/dL (ref 0–200)
HDL: 24 mg/dL — AB (ref >40)
LDL CALC: 88 mg/dL (ref 0–99)
Triglycerides: 97 mg/dL (ref <150)
VLDL: 19 mg/dL (ref 0–40)

## 2015-05-16 LAB — TROPONIN I
TROPONIN I: 16.31 ng/mL — AB (ref <0.031)
Troponin I: 18.6 ng/mL — ABNORMAL HIGH (ref <0.031)

## 2015-05-16 LAB — HEPARIN LEVEL (UNFRACTIONATED)
HEPARIN UNFRACTIONATED: 0.41 [IU]/mL (ref 0.30–0.70)
HEPARIN UNFRACTIONATED: 0.37 [IU]/mL (ref 0.30–0.70)
Heparin Unfractionated: 0.17 IU/mL — ABNORMAL LOW (ref 0.30–0.70)

## 2015-05-16 LAB — MRSA PCR SCREENING: MRSA by PCR: POSITIVE — AB

## 2015-05-16 LAB — HEMOGLOBIN AND HEMATOCRIT, BLOOD
HCT: 29.4 % — ABNORMAL LOW (ref 35.0–47.0)
Hemoglobin: 9.4 g/dL — ABNORMAL LOW (ref 12.0–16.0)

## 2015-05-16 LAB — PREPARE RBC (CROSSMATCH)

## 2015-05-16 LAB — LACTIC ACID, PLASMA: Lactic Acid, Venous: 1.7 mmol/L (ref 0.5–2.0)

## 2015-05-16 MED ORDER — CETYLPYRIDINIUM CHLORIDE 0.05 % MT LIQD
7.0000 mL | Freq: Two times a day (BID) | OROMUCOSAL | Status: DC
  Administered 2015-05-16 – 2015-05-24 (×15): 7 mL via OROMUCOSAL

## 2015-05-16 MED ORDER — INSULIN DETEMIR 100 UNIT/ML ~~LOC~~ SOLN
14.0000 [IU] | Freq: Every day | SUBCUTANEOUS | Status: DC
  Administered 2015-05-16 – 2015-05-18 (×3): 14 [IU] via SUBCUTANEOUS
  Filled 2015-05-16 (×4): qty 0.14

## 2015-05-16 MED ORDER — INSULIN ASPART 100 UNIT/ML ~~LOC~~ SOLN
0.0000 [IU] | Freq: Every day | SUBCUTANEOUS | Status: DC
  Administered 2015-05-16: 3 [IU] via SUBCUTANEOUS
  Administered 2015-05-18: 2 [IU] via SUBCUTANEOUS
  Filled 2015-05-16: qty 3
  Filled 2015-05-16 (×2): qty 2

## 2015-05-16 MED ORDER — MUPIROCIN 2 % EX OINT
1.0000 "application " | TOPICAL_OINTMENT | Freq: Two times a day (BID) | CUTANEOUS | Status: AC
  Administered 2015-05-16 – 2015-05-20 (×10): 1 via NASAL
  Filled 2015-05-16: qty 22

## 2015-05-16 MED ORDER — SODIUM CHLORIDE 0.9 % IV SOLN
Freq: Once | INTRAVENOUS | Status: AC
  Administered 2015-05-16: 15:00:00 via INTRAVENOUS

## 2015-05-16 MED ORDER — HEPARIN BOLUS VIA INFUSION
1900.0000 [IU] | Freq: Once | INTRAVENOUS | Status: AC
  Administered 2015-05-16: 1900 [IU] via INTRAVENOUS
  Filled 2015-05-16: qty 1900

## 2015-05-16 MED ORDER — HEPARIN (PORCINE) IN NACL 100-0.45 UNIT/ML-% IJ SOLN
1150.0000 [IU]/h | INTRAMUSCULAR | Status: DC
  Administered 2015-05-16 – 2015-05-17 (×3): 1000 [IU]/h via INTRAVENOUS
  Filled 2015-05-16 (×5): qty 250

## 2015-05-16 MED ORDER — INSULIN ASPART 100 UNIT/ML ~~LOC~~ SOLN
0.0000 [IU] | Freq: Three times a day (TID) | SUBCUTANEOUS | Status: DC
  Administered 2015-05-16: 1 [IU] via SUBCUTANEOUS
  Administered 2015-05-17 (×2): 2 [IU] via SUBCUTANEOUS
  Administered 2015-05-17 – 2015-05-18 (×2): 1 [IU] via SUBCUTANEOUS
  Administered 2015-05-18: 2 [IU] via SUBCUTANEOUS
  Administered 2015-05-18 – 2015-05-19 (×3): 3 [IU] via SUBCUTANEOUS
  Administered 2015-05-19: 5 [IU] via SUBCUTANEOUS
  Administered 2015-05-20: 2 [IU] via SUBCUTANEOUS
  Administered 2015-05-20: 5 [IU] via SUBCUTANEOUS
  Administered 2015-05-21 – 2015-05-22 (×2): 2 [IU] via SUBCUTANEOUS
  Administered 2015-05-22: 1 [IU] via SUBCUTANEOUS
  Administered 2015-05-23: 3 [IU] via SUBCUTANEOUS
  Administered 2015-05-24: 1 [IU] via SUBCUTANEOUS
  Filled 2015-05-16: qty 2
  Filled 2015-05-16 (×3): qty 1
  Filled 2015-05-16: qty 3
  Filled 2015-05-16: qty 2
  Filled 2015-05-16: qty 3
  Filled 2015-05-16: qty 1
  Filled 2015-05-16: qty 5
  Filled 2015-05-16 (×2): qty 2
  Filled 2015-05-16 (×2): qty 3
  Filled 2015-05-16: qty 5
  Filled 2015-05-16: qty 1

## 2015-05-16 MED ORDER — CHLORHEXIDINE GLUCONATE CLOTH 2 % EX PADS
6.0000 | MEDICATED_PAD | Freq: Every day | CUTANEOUS | Status: AC
  Administered 2015-05-17 – 2015-05-20 (×4): 6 via TOPICAL

## 2015-05-16 MED ORDER — TECHNETIUM TC 99M DIETHYLENETRIAME-PENTAACETIC ACID
32.7210 | Freq: Once | INTRAVENOUS | Status: AC | PRN
  Administered 2015-05-16: 32.721 via RESPIRATORY_TRACT

## 2015-05-16 NOTE — Progress Notes (Signed)
ANTICOAGULATION CONSULT NOTE - Initial Consult  Pharmacy Consult for heparin drip dosing and monitoring Indication: chest pain/ACS  Allergies  Allergen Reactions  . Contrast Media [Iodinated Diagnostic Agents]     Patient Measurements: Height: 5' (152.4 cm) Weight: 152 lb 11.2 oz (69.264 kg) IBW/kg (Calculated) : 45.5 Heparin Dosing Weight: 62.5 kg   Vital Signs: Temp: 98 F (36.7 C) (12/07 2042) Temp Source: Oral (12/07 2042) BP: 142/77 mmHg (12/07 2042) Pulse Rate: 97 (12/07 2042)  Labs:  Recent Labs  05/14/15 0727 05/15/15 1611 05/15/15 2046 05/15/15 2304 05/16/15 0250  HGB  --  8.2*  --   --   --   HCT  --  26.8*  --   --   --   PLT  --  620*  --   --   --   APTT  --   --   --  48*  --   LABPROT 20.4* 19.3*  --   --   --   INR 1.75 1.62  --   --   --   HEPARINUNFRC  --   --   --   --  0.17*  CREATININE 1.75* 2.18*  --   --   --   TROPONINI  --  14.68* 15.48* 18.60*  --     Estimated Creatinine Clearance: 18.8 mL/min (by C-G formula based on Cr of 2.18).   Medical History: Past Medical History  Diagnosis Date  . Diabetes mellitus without complication ([place])   . C. difficile diarrhea   . Uterine cancer ([place])   . Hypogammaglobulinemia ([place])   . Pulmonary embolism ([place])   . Decubitus ulcers   . Hypertension   . Hypercholesterolemia   . Incontinence of urine   . Cancer [facility])     Endometrial Cancer  . Cervical cancer ([place])     with radiation 45 years ago...  . Arthritis   . GERD (gastroesophageal reflux disease)   . Bladder cancer ([place])   . Decubitus ulcer of buttock, stage 1   . Peripheral vascular disease ([place])   . Anemia    Assessment: Pharmacy consulted to dose heparin in this 79 year old female presenting with elevated troponin on 14.68 with confusion and labored breathing. According the med rec, patient was taking warfarin prior to admission but unable to determine when last dose was given. INR has been ordered and resulted at 1.62.   Heparin  dosing weight 62.5 kg  Goal of Therapy:  Heparin level 0.3-0.7 units/ml Monitor platelets by anticoagulation protocol: Yes   Plan:  Heparin level subtherapeutic. 1900 unit IV x 1 bolus and increase rate to 1000 units/hr. Will recheck level in 8 hours.  Laural Benes, Pharm.D., BCPS Clinical Pharmacist 05/16/2015,3:28 AM

## 2015-05-16 NOTE — Progress Notes (Addendum)
Initial Nutrition Assessment   INTERVENTION:   Meals and Snacks: Cater to patient preferences; will revise diet order to include fluid restrction as well as Carb Modified to Heart healthy Medical Food Supplement Therapy: will recommend Sugar Free Mighty Shakes on meal trays BID and Magic Cup TID for added nutrition (each supplement provides approximately 300kcals and 9g protein)   NUTRITION DIAGNOSIS:   Increased nutrient needs related to chronic illness, wound healing as evidenced by estimated needs.  GOAL:   Patient will meet greater than or equal to 90% of their needs  MONITOR:    (Energy Intake, Anthropometrics, Electrolyte and renal Profile, Pulmonary Profile, Skin)  REASON FOR ASSESSMENT:    (Pressure Ulcer)    ASSESSMENT:   Per MD note, Attallah Karlsson is a 79 y.o. female with a known history of diabetes, pulmonary embolism, decubitus ulcer, hypertension, hypercholesterolemia, uterine cancer with metastasis, peripheral arterial disease and was seen by vascular Dr.Schiner 12/6 and had angioplasty done on her right lower extremity. Pt now admitted with SOB, pulmonary edema and NSTEMI. Pt on isolation for MRSA.  Past Medical History  Diagnosis Date  . Diabetes mellitus without complication ([place])   . C. difficile diarrhea   . Uterine cancer ([place])   . Hypogammaglobulinemia ([place])   . Pulmonary embolism ([place])   . Decubitus ulcers   . Hypertension   . Hypercholesterolemia   . Incontinence of urine   . Cancer [facility])     Endometrial Cancer  . Cervical cancer ([place])     with radiation 45 years ago...  . Arthritis   . GERD (gastroesophageal reflux disease)   . Bladder cancer ([place])   . Decubitus ulcer of buttock, stage 1   . Peripheral vascular disease ([place])   . Anemia     Diet Order:  Diet heart healthy/carb modified Room service appropriate?: Yes; Fluid consistency:: Thin; Fluid restriction:: 1200 mL Fluid    Current Nutrition: Pt being taken down for procedure on  visit. RN Velna Hatchet reports pt confused at present but did eat a little bit of breakfast this am. Pt on isolation, limited documentation recorded. No difficulty swallowing per Nsg.   Food/Nutrition-Related History: Unable to clarify with pt.    Scheduled Medications:  . sodium chloride   Intravenous Once  . amLODipine  5 mg Oral Daily  . antiseptic oral rinse  7 mL Mouth Rinse BID  . aspirin EC  81 mg Oral Daily  . atenolol  50 mg Oral Daily  . Chlorhexidine Gluconate Cloth  6 each Topical Q0600  . furosemide  20 mg Intravenous Q8H  . insulin aspart  0-9 Units Subcutaneous TID WC  . insulin detemir  10 Units Subcutaneous QHS  . ketoconazole  1 application Topical BID  . magnesium oxide  400 mg Oral Daily  . mirtazapine  15 mg Oral QHS  . mupirocin ointment  1 application Nasal BID  . pantoprazole  40 mg Oral QAC breakfast  . pravastatin  20 mg Oral q1800  . sodium chloride  3 mL Intravenous Q12H  . tamsulosin  0.4 mg Oral BID    Continuous Medications:  . heparin 1,000 Units/hr (05/16/15 0341)     Electrolyte/Renal Profile and Glucose Profile:   Recent Labs Lab 05/14/15 0727 05/15/15 1611 05/16/15 0420  NA 137 139 143  K 4.1 4.3 3.6  CL 110 109 112*  CO2 19* 18* 21*  BUN 57* 82* 82*  CREATININE 1.75* 2.18* 1.88*  CALCIUM 8.0* 8.2* 8.2*  GLUCOSE 200*  556* 316*   Protein Profile:   Recent Labs Lab 05/15/15 1611  ALBUMIN 2.7*    Gastrointestinal Profile: Last BM:  05/16/2015   Nutrition-Focused Physical Exam Findings:  Unable to complete Nutrition-Focused physical exam at this time.    Weight Change: Unable to clarify with pt. Per CHL pt weight relatively stable.   Skin:   Multiple stage II and stage III pressure ulcers on buttock and sacrum. Pt also with gangrenous right foot.   Height:   Ht Readings from Last 1 Encounters:  05/15/15 5' (1.524 m)    Weight:   Wt Readings from Last 1 Encounters:  05/15/15 152 lb 11.2 oz (69.264 kg)    Wt  Readings from Last 10 Encounters:  05/15/15 152 lb 11.2 oz (69.264 kg)  05/14/15 156 lb (70.761 kg)  04/05/15 156 lb (70.761 kg)  02/12/15 156 lb 6.4 oz (70.943 kg)  12/17/14 164 lb 4.8 oz (74.526 kg)  11/30/14 156 lb 6.7 oz (70.95 kg)  11/23/14 151 lb 14.4 oz (68.9 kg)    Ideal Body Weight:   45.5kg  BMI:  Body mass index is 29.82 kg/(m^2).  Estimated Nutritional Needs:   Kcal:  using IBW of 45.5kg, BEE: 862kcals, TEE: (IF 1.2-1.4)(AF 1.2) 1240-1447kcals  Protein:  55-68g protein (1.2-1.5g/kg)  Fluid:  1138-1327mL of fluid (25-49mL/kg)  EDUCATION NEEDS:   Education needs no appropriate at this time   [place], RD, LDN Pager [phone]

## 2015-05-16 NOTE — NC FL2 (Signed)
[place] LEVEL OF CARE SCREENING TOOL     IDENTIFICATION  Patient Name: Shelley Fields Birthdate: 12/20/1935 Sex: female Admission Date (Current Location): 05/15/2015  [facility] and [place] Number: Engineering geologist and Address:  [facility], [address], [place], [place] [address]      Provider Number: [id number]  Attending Physician Name and Address:  Nicholes Mango, MD  Relative Name and Phone Number:       Current Level of Care: Hospital Recommended Level of Care: [place] Prior Approval Number:    Date Approved/Denied:   PASRR Number:    Discharge Plan: SNF    Current Diagnoses: Patient Active Problem List   Diagnosis Date Noted  . NSTEMI (non-ST elevated myocardial infarction) ([place]) 05/15/2015  . CHF (congestive heart failure) ([place]) 05/15/2015  . Chronic anticoagulation 04/21/2015  . B12 deficiency 03/18/2015  . Anemia 02/17/2015  . Weight loss 02/17/2015  . Severe anemia 12/12/2014  . GI bleed 12/12/2014  . DM (diabetes mellitus), type 2, uncontrolled ([place]) 12/12/2014  . Diabetes mellitus with hyperglycemia ([place]) 12/12/2014  . Leucocytosis 12/12/2014  . UTI (urinary tract infection) 12/12/2014  . Decubitus ulcer of back, stage 2 12/12/2014  . HTN (hypertension) 12/12/2014  . Anorexia 11/23/2014  . Encounter for other specified administrative purpose 07/10/2013  . Referral of patient 07/10/2013  . Candidal vulvovaginitis 10/14/2012  . Candida vaginitis 10/14/2012  . Polypharmacy 06/23/2012  . Long term current use of opiate analgesic 06/23/2012  . Breast lump 06/10/2012  . Laboratory examination 04/12/2012  . Diagnostic skin and sensitization tests 04/12/2012  . Bladder infection, chronic 03/23/2012  . Total urinary incontinence 03/23/2012  . 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 Female genuine stress incontinence 03/23/2012  . Disorder of bladder function 03/23/2012  . Incomplete bladder emptying  03/23/2012  . 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 Cystitis, radiation 03/23/2012  . Cancer of body of uterus ([place]) 03/23/2012  . Cancer of corpus uteri, except isthmus ([place]) 03/23/2012  . Bladder neoplasm of uncertain malignant potential 03/23/2012  . Infection of urinary tract 03/23/2012  . Colon polyp 01/06/2012  . Difficulty in walking 01/06/2012  . Diabetes ([place]) 01/06/2012  . Deep vein thrombosis ([place]) 01/06/2012  . Cancer of endometrium ([place]) 01/06/2012  . Contact with and exposure to tuberculosis 01/06/2012  . BP (high blood pressure) 01/06/2012  . Angiopathy, peripheral ([place]) 01/06/2012  . Abnormal presence of protein in urine 01/06/2012  . Retinopathy 01/06/2012    Orientation ACTIVITIES/SOCIAL BLADDER RESPIRATION    Self  Active Incontinent Normal  BEHAVIORAL SYMPTOMS/MOOD NEUROLOGICAL BOWEL NUTRITION STATUS   (none)  (none) Continent Diet (cardiac)  PHYSICIAN VISITS COMMUNICATION OF NEEDS Height & Weight Skin  30 days Verbally   152 lbs. Normal          AMBULATORY STATUS RESPIRATION    Assist extensive Normal      Personal Care Assistance Level of Assistance  Bathing, Dressing Bathing Assistance: Limited assistance   Dressing Assistance: Limited assistance      Functional Limitations Info                SPECIAL CARE FACTORS FREQUENCY                      Additional Factors Info  Code Status, Allergies Code Status Info: DNR Allergies Info: contrast           Current Medications (05/16/2015):  This is the current hospital active medication list  Current Facility-Administered Medications  Medication Dose Route Frequency Provider Last Rate Last Dose  . 0.9 %  sodium chloride infusion   Intravenous Once Nicholes Mango, MD      . amLODipine (NORVASC) tablet 5 mg  5 mg Oral Daily Vaughan Basta, MD   5 mg at 05/16/15 0820  . antiseptic oral rinse (CPC / CETYLPYRIDINIUM CHLORIDE 0.05%) solution 7 mL  7 mL Mouth Rinse BID Nicholes Mango, MD      . aspirin EC tablet 81 mg  81 mg Oral Daily Vaughan Basta, MD   81 mg at 05/16/15 0820  . atenolol (TENORMIN) tablet 50 mg  50 mg Oral Daily Vaughan Basta, MD   50 mg at 05/16/15 0819  . Chlorhexidine Gluconate Cloth 2 % PADS 6 each  6 each Topical Q0600 Vaughan Basta, MD      . furosemide (LASIX) injection 20 mg  20 mg Intravenous Q8H Vaughan Basta, MD   20 mg at 05/16/15 0201  . heparin ADULT infusion 100 units/mL (25000 units/250 mL)  1,000 Units/hr Intravenous Continuous Vaughan Basta, MD 10 mL/hr at 05/16/15 0341 1,000 Units/hr at 05/16/15 0341  . insulin aspart (novoLOG) injection 0-9 Units  0-9 Units Subcutaneous TID WC Vaughan Basta, MD   5 Units at 05/16/15 0800  . insulin detemir (LEVEMIR) injection 10 Units  10 Units Subcutaneous QHS Vaughan Basta, MD   10 Units at 05/16/15 0201  . ketoconazole (NIZORAL) 2 % cream 1 application  1 application Topical BID Vaughan Basta, MD   1 application at [id number]  . magnesium oxide (MAG-OX) tablet 400 mg  400 mg Oral Daily Vaughan Basta, MD   400 mg at 05/16/15 0820  . mirtazapine (REMERON) tablet 15 mg  15 mg Oral QHS Vaughan Basta, MD   15 mg at 05/16/15 0159  . mupirocin ointment (BACTROBAN) 2 % 1 application  1 application Nasal BID Vaughan Basta, MD      . oxyCODONE (Oxy IR/ROXICODONE) immediate release tablet 15 mg  15 mg Oral Q4H PRN Vaughan Basta, MD   15 mg at 05/16/15 0159  . pantoprazole (PROTONIX) EC tablet 40 mg  40 mg Oral QAC breakfast Vaughan Basta, MD   40 mg at 05/16/15 0820  . pravastatin (PRAVACHOL) tablet 20 mg  20 mg Oral q1800 Vaughan Basta, MD      . promethazine (PHENERGAN) tablet 25 mg  25 mg Oral Q8H PRN Vaughan Basta, MD      . sodium chloride 0.9 % injection 3 mL  3 mL Intravenous Q12H Vaughan Basta, MD   3 mL at 05/16/15 0200  . tamsulosin (FLOMAX) capsule 0.4 mg  0.4 mg Oral  BID Vaughan Basta, MD   0.4 mg at 05/16/15 0820   Facility-Administered Medications Ordered in Other Encounters  Medication Dose Route Frequency Provider Last Rate Last Dose  . sodium chloride 0.9 % injection 10 mL  10 mL Intravenous PRN Evlyn Kanner, NP   10 mL at 11/02/14 [id number]     Discharge Medications: Please see discharge summary for a list of discharge medications.  Relevant Imaging Results:  Relevant Lab Results:  Recent Labs    Additional Information    Shela Leff, LCSW

## 2015-05-16 NOTE — Progress Notes (Signed)
ANTICOAGULATION CONSULT NOTE -Follow up  Pharmacy Consult for heparin drip dosing and monitoring Indication: chest pain/ACS  Allergies  Allergen Reactions  . Contrast Media [Iodinated Diagnostic Agents]     Patient Measurements: Height: 5' (152.4 cm) Weight: 152 lb 11.2 oz (69.264 kg) IBW/kg (Calculated) : 45.5 Heparin Dosing Weight: 62.5 kg   Vital Signs: Temp: 97.8 F (36.6 C) (12/08 1206) Temp Source: Oral (12/08 0519) BP: 122/51 mmHg (12/08 1206) Pulse Rate: 73 (12/08 1206)  Labs:  Recent Labs  05/14/15 0727  05/15/15 1611 05/15/15 2046 05/15/15 2304 05/16/15 0250 05/16/15 0420 05/16/15 1218  HGB  --   --  8.2*  --   --   --  7.3*  --   HCT  --   --  26.8*  --   --   --  22.9*  --   PLT  --   --  620*  --   --   --  487*  --   APTT  --   --   --   --  48*  --   --   --   LABPROT 20.4*  --  19.3*  --   --   --   --   --   INR 1.75  --  1.62  --   --   --   --   --   HEPARINUNFRC  --   --   --   --   --  0.17*  --  0.41  CREATININE 1.75*  --  2.18*  --   --   --  1.88*  --   TROPONINI  --   < > 14.68* 15.48* 18.60* 16.31*  --   --   < > = values in this interval not displayed.  Estimated Creatinine Clearance: 21.8 mL/min (by C-G formula based on Cr of 1.88).   Medical History: Past Medical History  Diagnosis Date  . Diabetes mellitus without complication ([place])   . C. difficile diarrhea   . Uterine cancer ([place])   . Hypogammaglobulinemia ([place])   . Pulmonary embolism ([place])   . Decubitus ulcers   . Hypertension   . Hypercholesterolemia   . Incontinence of urine   . Cancer [facility])     Endometrial Cancer  . Cervical cancer ([place])     with radiation 45 years ago...  . Arthritis   . GERD (gastroesophageal reflux disease)   . Bladder cancer ([place])   . Decubitus ulcer of buttock, stage 1   . Peripheral vascular disease ([place])   . Anemia    Assessment: Pharmacy consulted to dose heparin in this 79 year old female presenting with elevated troponin on 14.68  with confusion and labored breathing. According the med rec, patient was taking warfarin prior to admission but unable to determine when last dose was given. INR has been ordered and resulted at 1.62.   Heparin dosing weight 62.5 kg  Goal of Therapy:  Heparin level 0.3-0.7 units/ml Monitor platelets by anticoagulation protocol: Yes   Plan:  Heparin level subtherapeutic. 1900 unit IV x 1 bolus and increase rate to 1000 units/hr. Will recheck level in 8 hours.  12/8:  Heparin level at 1218= 0.41. Will continue current rate and recheck to verify therapeutic level in 8 hours.   Chinita Greenland PharmD Clinical Pharmacist 05/16/2015 2:18 PM

## 2015-05-16 NOTE — Consult Note (Signed)
[facility] Cardiology  CARDIOLOGY CONSULT NOTE  Patient ID: Shelley Fields MRN: [id number] DOB/AGE: [birth year] 77 y.o.  Admit date: 05/15/2015 Referring Physician Anselm Jungling Primary Physician [facility] Primary Cardiologist  Reason for Consultation NSTEMI  HPI: 79 year old female referred for evaluation of non-ST elevation myocardial infarction. The patient has multiple comorbidities including type 2 diabetes, history of pulmonary embolism, cubitus ulcer, metastatic uterine cancer and peripheral vascular disease. Patient underwent a tenuous transluminal angioplasty of the right common femoral artery, right external iliac artery and right peroneal artery on 05/14/15 for gangrene of the right foot. The patient was discharged to skilled nursing oriented to return the following day with this of breath. X-ray revealed pulmonary edema. EKG was nondiagnostic. Troponin was elevated to 14. The patient denies chest pain.  Review of systems complete and found to be negative unless listed above     Past Medical History  Diagnosis Date  . Diabetes mellitus without complication ([place])   . C. difficile diarrhea   . Uterine cancer ([place])   . Hypogammaglobulinemia ([place])   . Pulmonary embolism ([place])   . Decubitus ulcers   . Hypertension   . Hypercholesterolemia   . Incontinence of urine   . Cancer [facility])     Endometrial Cancer  . Cervical cancer ([place])     with radiation 45 years ago...  . Arthritis   . GERD (gastroesophageal reflux disease)   . Bladder cancer ([place])   . Decubitus ulcer of buttock, stage 1   . Peripheral vascular disease ([place])   . Anemia     Past Surgical History  Procedure Laterality Date  . Total abdominal hysterectomy w/ bilateral salpingoophorectomy  07/29/2010  . Stent in leg  2016  . Cataract extraction, bilateral    . Bladder surgery    . Femoral endarterectomy      right  . Leg surgery      right leg  . Portacath placement      right  . Esophagogastroduodenoscopy N/A 12/14/2014     Procedure: ESOPHAGOGASTRODUODENOSCOPY (EGD);  Surgeon: Hulen Luster, MD;  Location[facility] ENDOSCOPY;  Service: Endoscopy;  Laterality: N/A;  . Peripheral vascular catheterization Right 04/05/2015    Procedure: Lower Extremity Angiography;  Surgeon: Katha Cabal, MD;  Location: [place] CV LAB;  Service: Cardiovascular;  Laterality: Right;  . Peripheral vascular catheterization  04/05/2015    Procedure: Lower Extremity Intervention;  Surgeon: Katha Cabal, MD;  Location: [place] CV LAB;  Service: Cardiovascular;;  . Peripheral vascular catheterization Right 05/14/2015    Procedure: Lower Extremity Angiography;  Surgeon: Katha Cabal, MD;  Location: [place] CV LAB;  Service: Cardiovascular;  Laterality: Right;  . Peripheral vascular catheterization Right 05/14/2015    Procedure: Lower Extremity Intervention;  Surgeon: Katha Cabal, MD;  Location: [place] CV LAB;  Service: Cardiovascular;  Laterality: Right;  . Angioplasty      Prescriptions prior to admission  Medication Sig Dispense Refill Last Dose  . amLODipine (NORVASC) 5 MG tablet TAKE 1 TABLET BY MOUTH EVERY DAY 90 tablet 3 05/13/2015 at Unknown time  . aspirin 81 MG tablet Take 81 mg by mouth daily.   05/13/2015 at Unknown time  . atenolol (TENORMIN) 50 MG tablet Take 50 mg by mouth daily.   05/13/2015 at Unknown time  . benazepril (LOTENSIN) 40 MG tablet Take 40 mg by mouth daily.   05/13/2015 at Unknown time  . cloNIDine (CATAPRES) 0.2 MG tablet Take 0.2 mg by mouth 2 (two) times  daily.   Taking  . furosemide (LASIX) 20 MG tablet Take 20 mg by mouth every other day.   05/13/2015 at Unknown time  . HUMULIN 70/30 (70-30) 100 UNIT/ML injection 16 Units daily at 6 PM.   05/13/2015 at Unknown time  . hydrALAZINE (APRESOLINE) 100 MG tablet Take 100 mg by mouth 2 (two) times daily.   05/13/2015 at Unknown time  . insulin aspart (NOVOLOG) cartridge Inject into the skin 3 (three) times daily with meals.   05/13/2015  at Unknown time  . ketoconazole (NIZORAL) 2 % cream Apply 1 application topically 2 (two) times daily.   05/13/2015 at Unknown time  . lovastatin (MEVACOR) 20 MG tablet TAKE 1 TABLET BY MOUTH AT BEDTIME 30 tablet 0 Taking  . magnesium oxide (MAG-OX) 400 MG tablet Take 400 mg by mouth daily.   05/13/2015 at Unknown time  . meclizine (ANTIVERT) 25 MG tablet Take 25 mg by mouth daily.   Taking  . megestrol (MEGACE ES) 625 MG/5ML suspension Take 1.6 mLs (200 mg total) by mouth daily. to stimulate appetite 150 mL 0 05/13/2015 at Unknown time  . mirtazapine (REMERON) 15 MG tablet Take 15 mg by mouth at bedtime.    05/13/2015 at Unknown time  . MORPHINE SULFATE PO Take 5 mg by mouth every 3 (three) hours as needed.   05/13/2015 at Unknown time  . mupirocin ointment (BACTROBAN) 2 %    Taking  . nystatin cream (MYCOSTATIN) Apply 1 application topically 2 (two) times daily. Apply to buttocks   05/13/2015 at Unknown time  . omeprazole (PRILOSEC) 20 MG capsule Take 20 mg by mouth every other day.   12 05/13/2015 at Unknown time  . oxyCODONE (ROXICODONE) 15 MG immediate release tablet Take 15 mg by mouth every 4 (four) hours as needed for pain.   05/14/2015 at Unknown time  . potassium chloride (K-DUR) 10 MEQ tablet Take 1 tablet by mouth daily.  0 Taking  . promethazine (PHENERGAN) 25 MG tablet Take 25 mg by mouth every 8 (eight) hours as needed for nausea or vomiting.   Taking  . SANTYL ointment    Taking  . tamsulosin (FLOMAX) 0.4 MG CAPS capsule Take 0.4 mg by mouth 2 (two) times daily.   05/13/2015 at Unknown time  . VITAMIN K, PHYTONADIONE, PO Take 1.25 mg by mouth 2 (two) times a week. Mon and friday   Past Week at Unknown time  . warfarin (COUMADIN) 1 MG tablet Take 1.5 mg by mouth daily.   05/13/2015 at Unknown time   Social History   Social History  . Marital Status: Widowed    Spouse Name: N/A  . Number of Children: N/A  . Years of Education: N/A   Occupational History  . Not on file.   Social  History Main Topics  . Smoking status: Never Smoker   . Smokeless tobacco: Not on file  . Alcohol Use: No  . Drug Use: No  . Sexual Activity: Not on file   Other Topics Concern  . Not on file   Social History Narrative    Family History  Problem Relation Age of Onset  . Hypertension Mother   . Diabetes Mellitus II Mother   . Coronary artery disease Mother   . Hypertension Father   . Diabetes Mellitus II Father   . CAD Father       Review of systems complete and found to be negative unless listed above      PHYSICAL EXAM  General: Well developed, well nourished, in no acute distress HEENT:  Normocephalic and atramatic Neck:  No JVD.  Lungs: Clear bilaterally to auscultation and percussion. Heart: HRRR . Normal S1 and S2 without gallops or murmurs.  Abdomen: Bowel sounds are positive, abdomen soft and non-tender  Msk:  Back normal, normal gait. Normal strength and tone for age. Extremities: No clubbing, cyanosis or edema.   Neuro: Alert and oriented X 3. Psych:  Good affect, responds appropriately  Labs:   Lab Results  Component Value Date   WBC 12.9* 05/16/2015   HGB 7.3* 05/16/2015   HCT 22.9* 05/16/2015   MCV 84.0 05/16/2015   PLT 487* 05/16/2015    Recent Labs Lab 05/15/15 1611 05/16/15 0420  NA 139 143  K 4.3 3.6  CL 109 112*  CO2 18* 21*  BUN 82* 82*  CREATININE 2.18* 1.88*  CALCIUM 8.2* 8.2*  PROT 7.5  --   BILITOT 0.2*  --   ALKPHOS 32*  --   ALT 62*  --   AST 105*  --   GLUCOSE 556* 316*   Lab Results  Component Value Date   TROPONINI 16.31* 05/16/2015    Lab Results  Component Value Date   CHOL 131 05/16/2015   Lab Results  Component Value Date   HDL 24* 05/16/2015   Lab Results  Component Value Date   LDLCALC 88 05/16/2015   Lab Results  Component Value Date   TRIG 97 05/16/2015   Lab Results  Component Value Date   CHOLHDL 5.5 05/16/2015   No results found for: LDLDIRECT    Radiology: Dg Chest Port 1  View  05/15/2015  CLINICAL DATA:  Confusion. Difficulty breathing. History of endometrial carcinoma. EXAM: PORTABLE CHEST 1 VIEW COMPARISON:  December 12, 2014 FINDINGS: There is airspace consolidation throughout much of the right mid and lower lung zones. A lesser degree of infiltrate is noted in the left base. There is also underlying interstitial edema. Heart is slightly enlarged with pulmonary venous hypertension. No adenopathy. Port-A-Cath tip is in the superior vena cava. No pneumothorax. IMPRESSION: Airspace consolidation in both lung bases as well as in the right mid lung. Underlying congestive heart failure. The airspace opacity may represent alveolar edema, although the appearance is concerning for superimposed pneumonia. Note that a nodular opacity in the right mid lung noted previously is obscured by infiltrate currently. Electronically Signed   By: Lowella Grip III M.D.   On: 05/15/2015 16:15    EKG: Normal sinus rhythm  ASSESSMENT AND PLAN:   1. Non-ST elevation myocardial infarction, nondiagnostic ECG, and the absence of chest pain, in the setting of gangrene of the right foot following TPA of right common femoral artery, right external iliac artery and right peroneal artery with chronic kidney disease and significant anemia. 2. Congestive heart failure, improved after initial diuresis 3. Gangrene of right foot, status post PTCA 4. Chronic kidney disease 5. Moderate to severe anemia  Recommendations  1. Defer initial early invasive cardiac evaluation in the absence of chest pain, in the setting of the right foot 2. Continue heparin for 48-72 hours 3. Continue diuresis 4. Consider transfusion 5. Review 2-D echocardiogram 6. Further recommendations pending echocardiogram results   Signed: Laretha Luepke MD,PhD, [facility] 05/16/2015, 12:45 PM

## 2015-05-16 NOTE — Care Management (Signed)
Patient is from [facility] and it is anticipated patient will return.  CSW is involved

## 2015-05-16 NOTE — Progress Notes (Signed)
Admission skin assessment with Shelley Fields

## 2015-05-16 NOTE — Clinical Social Work Note (Signed)
CSW informed that patient is a resident of [facility]. CSW attempted to speak with patient but she was unable to answer questions and did not know how long she had been at [facility]. CSW attempted to contact patient's daughter: Enid [id number] but had to leave a message on her voicemail. Fl2 begun in Epic. Shela Leff MSW,LCSW [phone]

## 2015-05-16 NOTE — Clinical Social Work Note (Signed)
Clinical Social Work Assessment  Patient Details  Name: Shelley Fields MRN: [id number] Date of Birth: 09-Apr-1936  Date of referral:  05/16/15               Reason for consult:  Facility Placement                Permission sought to share information with:  Family Supports, Chartered certified accountant granted to share information::  Yes, Verbal Permission Granted  Name::        Agency::     Relationship::     Contact Information:     Housing/Transportation Living arrangements for the past 2 months:  [place] of Information:  Adult Children Patient Interpreter Needed:  None Criminal Activity/Legal Involvement Pertinent to Current Situation/Hospitalization:  No - Comment as needed Significant Relationships:  Adult Children Lives with:  Facility Resident Do you feel safe going back to the place where you live?  Yes Need for family participation in patient care:  No (Coment)  Care giving concerns:  Patient resides at [facility] / plan:  CSW was able to speak with patient's daughter, Kalman Shan, this afternoon as she was able to call CSW back. She confirms that patient is a long term resident at [facility] and that she wishes for her mother to return when time.   Employment status:  Retired Forensic scientist:    PT Recommendations:    Information / Referral to community resources:     Patient/Family's Response to care:  Patent attorney for CSW assistance.  Patient/Family's Understanding of and Emotional Response to Diagnosis, Current Treatment, and Prognosis:  Patient's daughter engaged in conversation with CSW and is in agreement with return to [facility] when time.  Emotional Assessment Appearance:  Appears stated age Attitude/Demeanor/Rapport:   (pleasant) Affect (typically observed):  Calm Orientation:  Oriented to Self Alcohol / Substance use:  Not Applicable Psych involvement (Current and /or in the community):  No  (Comment)  Discharge Needs  Concerns to be addressed:  Care Coordination Readmission within the last 30 days:  No Current discharge risk:  None Barriers to Discharge:  No Barriers Identified   Shela Leff, LCSW 05/16/2015, 2:12 PM

## 2015-05-16 NOTE — Progress Notes (Signed)
[facility] [place] NAME: Shelley Fields    MR#:  [id number]  DATE OF BIRTH:  [birth year]  SUBJECTIVE:  CHIEF COMPLAINT:  Patient is resting comfortably. Denies any chest pain or shortness of breath at this time. She is on heparin drip. Sister at bedside  REVIEW OF SYSTEMS:  CONSTITUTIONAL: No fever, fatigue or weakness.  EYES: No blurred or double vision.  EARS, NOSE, AND THROAT: No tinnitus or ear pain.  RESPIRATORY: No cough, shortness of breath, wheezing or hemoptysis.  CARDIOVASCULAR: No chest pain, orthopnea, edema.  GASTROINTESTINAL: No nausea, vomiting, diarrhea or abdominal pain.  GENITOURINARY: No dysuria, hematuria.  ENDOCRINE: No polyuria, nocturia,  HEMATOLOGY: No anemia, easy bruising or bleeding SKIN: No rash or lesion. MUSCULOSKELETAL: No joint pain or arthritis. Has chronic peripheral vascular disease and had angioplasty for right foot gangrene recently  NEUROLOGIC: No tingling, numbness, weakness.  PSYCHIATRY: No anxiety or depression.   DRUG ALLERGIES:   Allergies  Allergen Reactions  . Contrast Media [Iodinated Diagnostic Agents]     VITALS:  Blood pressure 119/50, pulse 72, temperature 97.8 F (36.6 C), temperature source Oral, resp. rate 18, height 5' (1.524 m), weight 69.264 kg (152 lb 11.2 oz), SpO2 100 %.  PHYSICAL EXAMINATION:  GENERAL:  79 y.o.-year-old patient lying in the bed with no acute distress.  EYES: Pupils equal, round, reactive to light and accommodation. No scleral icterus. Extraocular muscles intact.  HEENT: Head atraumatic, normocephalic. Oropharynx and nasopharynx clear.  NECK:  Supple, no jugular venous distention. No thyroid enlargement, no tenderness.  LUNGS: Normal breath sounds bilaterally, no wheezing, rales,rhonchi or crepitation. No use of accessory muscles of respiration.  CARDIOVASCULAR: S1, S2 normal. No murmurs, rubs, or gallops.  ABDOMEN: Soft, nontender, nondistended. Bowel  sounds present. No organomegaly or mass.  EXTREMITIES: No pedal edema, cyanosis, or clubbing. Diminished peripheral pulses NEUROLOGIC: Cranial nerves II through XII are intact. Muscle strength 5/5 in all extremities. Sensation intact. Gait not checked.  PSYCHIATRIC: The patient is alert and oriented x 3.  SKIN: No obvious rash, lesion, or ulcer.    LABORATORY PANEL:   CBC  Recent Labs Lab 05/16/15 0420  WBC 12.9*  HGB 7.3*  HCT 22.9*  PLT 487*   ------------------------------------------------------------------------------------------------------------------  Chemistries   Recent Labs Lab 05/15/15 1611 05/16/15 0420  NA 139 143  K 4.3 3.6  CL 109 112*  CO2 18* 21*  GLUCOSE 556* 316*  BUN 82* 82*  CREATININE 2.18* 1.88*  CALCIUM 8.2* 8.2*  AST 105*  --   ALT 62*  --   ALKPHOS 32*  --   BILITOT 0.2*  --    ------------------------------------------------------------------------------------------------------------------  Cardiac Enzymes  Recent Labs Lab 05/16/15 0250  TROPONINI 16.31*   ------------------------------------------------------------------------------------------------------------------  RADIOLOGY:  Dg Chest 2 View  05/16/2015  CLINICAL DATA:  Pulmonary edema. EXAM: CHEST  2 VIEW COMPARISON:  05/15/2015 FINDINGS: Improved pulmonary edema. Small bilateral pleural effusion, greater on the left. Chronic cardiomegaly. Porta catheter from the right, tip still at the SVC level. Calcified peritracheal nodes on the right. There is a 21 mm nodule over the right perihilar lung which appears increased from July 2016, suspected metastatic disease in this patient with metastatic retroperitoneal adenopathy. IMPRESSION: 1. Improved pulmonary edema.  Stable small pleural effusions. 2. 2 cm right perihilar nodule which appears increased from July 2016. Electronically Signed   By: Monte Fantasia M.D.   On: 05/16/2015 13:42   Nm Pulmonary Perf And Vent  05/16/2015   CLINICAL DATA:  Pulmonary edema. EXAM: NUCLEAR MEDICINE VENTILATION - PERFUSION LUNG SCAN TECHNIQUE: Ventilation images were obtained in multiple projections using inhaled aerosol Tc-75m DTPA. Perfusion images were obtained in multiple projections after intravenous injection of Tc-43m MAA. RADIOPHARMACEUTICALS:  32.7 Technetium-37m DTPA aerosol inhalation. No technetium 99 m MAA administered. COMPARISON:  Chest x-ray 05/26/2015. FINDINGS: No ventilation perfusion images obtained as patient would only allow ventilation for approximately 1 minute. The patient refused remainder of the exam. IMPRESSION: Patient refused exam just after ventilation.  No images obtained. Electronically Signed   By: [place]   On: 05/16/2015 14:54   Dg Chest Port 1 View  05/15/2015  CLINICAL DATA:  Confusion. Difficulty breathing. History of endometrial carcinoma. EXAM: PORTABLE CHEST 1 VIEW COMPARISON:  December 12, 2014 FINDINGS: There is airspace consolidation throughout much of the right mid and lower lung zones. A lesser degree of infiltrate is noted in the left base. There is also underlying interstitial edema. Heart is slightly enlarged with pulmonary venous hypertension. No adenopathy. Port-A-Cath tip is in the superior vena cava. No pneumothorax. IMPRESSION: Airspace consolidation in both lung bases as well as in the right mid lung. Underlying congestive heart failure. The airspace opacity may represent alveolar edema, although the appearance is concerning for superimposed pneumonia. Note that a nodular opacity in the right mid lung noted previously is obscured by infiltrate currently. Electronically Signed   By: Lowella Grip III M.D.   On: 05/15/2015 16:15    EKG:   Orders placed or performed during the hospital encounter of 05/15/15  . ED EKG  . ED EKG    ASSESSMENT AND PLAN:   * Non-ST elevation MI Patient is currently asymptomatic , continue Heparin IV drip for 48-72 hours Cardiology is not considering  cardiac catheterization today Continue Aspirin, statin  lipid panel,  monitor on telemetry Serial troponins 18.6-16.31 Pending echocardiogram  Appreciate cardiologist recommendations  * Acute on chronic Congestive heart failure We do not have previous echocardiogram to check her ejection fraction. Will get an echocardiogram  Continue IV Lasix  Intake and output measurement  close monitoring of renal function is she has worsening renal function also.  *Anemia with hemoglobin 7.3 in the setting of non-STEMI We will get 1 unit of blood transfusion and check CBC in a.m.  * Acute on chronic renal failure Baseline creatinine was around 1.3 for 5 month ago, now it is more than 2.18-->1.8 Patient is on IV Lasix for acute on chronic CHF,  monitor renal function,: Nephrology consult is placed Hold ACE inhibitor and try to maintain the blood pressures    * History of pulmonary embolism This is as per the previous records but patient is not aware about any kind of blood clots, though she was taking Coumadin at home. Her INR is subtherapeutic , hold Coumadin for now Patient is on heparin drip for non-STEMI   * Hyperglycemia with diabetes Continue 10 units of Levemir   on sliding scale coverage.  * Hypertension She is on multiple antihypertensive medications but her blood pressure is running between [id number] to [id number] systolic range, Continue amlodipine, atenolol and Lasix IV        All the records are reviewed and case discussed with Care Management/Social Workerr. Management plans discussed with the patient, family and they are in agreement.  CODE STATUS: DO NOT RESUSCITATE  TOTAL CRITICAL CARE TIME TAKING CARE OF THIS PATIENT: 35 minutes.   POSSIBLE D/C IN 1-2 DAYS, DEPENDING ON CLINICAL  CONDITION.   Nicholes Mango M.D on 05/16/2015 at 3:46 PM  Between 7am to 6pm - Pager - [phone] After 6pm go to [email or web address] - password [email or web address] [facility]   Office  [phone]  CC: Primary care physician; [name], MD

## 2015-05-16 NOTE — Consult Note (Signed)
[facility]  CONSULT NOTE    Date: 05/16/2015                  Patient Name:  Shelley Fields  MRN: [id number]  DOB: 11/08/1935  Age / Sex: 79 y.o., female         PCP: [name], MD                 Service Requesting Consult: Dr. Anselm Jungling                 Reason for Consult: Acute renal failure on chronic kidney disease stage III            History of Present Illness: Shelley Fields is a 79 y.o. white female SNF resident with peripheral vascular disease, hypertension, diabetes mellitus type II Insulin dependent, anemia, hyperlipidemia, history of uterine cancer and bladder cancer with chronic foley catheter, who was admitted to [facility] on 05/15/2015 for Acute pulmonary edema ([place]) [J81.0] Pulmonary edema [J81.1] Dyspnea [R06.00] Hyperglycemia [R73.9] Hypoxia [R09.02] NSTEMI (non-ST elevated myocardial infarction) [facility]) [I21.4]  Patient had vascular procedure by Dr. Delana Meyer on 12/6. Creatinine was at baseline at 1.75 then. Patient then presented to ED yesterday with shortness of breath. Found to have elevated tropinin levels, pulmonary edema, and elevated serum creatinine of 2.88. Patient given IV furosemide 40mg  and started to feel better.   This morning sister at bedside who assists with history taking. Patient states she is feeling well. She has no complaints.    Medications: Outpatient medications: Prescriptions prior to admission  Medication Sig Dispense Refill Last Dose  . amLODipine (NORVASC) 5 MG tablet TAKE 1 TABLET BY MOUTH EVERY DAY 90 tablet 3 05/13/2015 at Unknown time  . aspirin 81 MG tablet Take 81 mg by mouth daily.   05/13/2015 at Unknown time  . atenolol (TENORMIN) 50 MG tablet Take 50 mg by mouth daily.   05/13/2015 at Unknown time  . benazepril (LOTENSIN) 40 MG tablet Take 40 mg by mouth daily.   05/13/2015 at Unknown time  . cloNIDine (CATAPRES) 0.2 MG tablet Take 0.2 mg by mouth 2 (two) times daily.   Taking  . furosemide (LASIX) 20  MG tablet Take 20 mg by mouth every other day.   05/13/2015 at Unknown time  . HUMULIN 70/30 (70-30) 100 UNIT/ML injection 16 Units daily at 6 PM.   05/13/2015 at Unknown time  . hydrALAZINE (APRESOLINE) 100 MG tablet Take 100 mg by mouth 2 (two) times daily.   05/13/2015 at Unknown time  . insulin aspart (NOVOLOG) cartridge Inject into the skin 3 (three) times daily with meals.   05/13/2015 at Unknown time  . ketoconazole (NIZORAL) 2 % cream Apply 1 application topically 2 (two) times daily.   05/13/2015 at Unknown time  . lovastatin (MEVACOR) 20 MG tablet TAKE 1 TABLET BY MOUTH AT BEDTIME 30 tablet 0 Taking  . magnesium oxide (MAG-OX) 400 MG tablet Take 400 mg by mouth daily.   05/13/2015 at Unknown time  . meclizine (ANTIVERT) 25 MG tablet Take 25 mg by mouth daily.   Taking  . megestrol (MEGACE ES) 625 MG/5ML suspension Take 1.6 mLs (200 mg total) by mouth daily. to stimulate appetite 150 mL 0 05/13/2015 at Unknown time  . mirtazapine (REMERON) 15 MG tablet Take 15 mg by mouth at bedtime.    05/13/2015 at Unknown time  . MORPHINE SULFATE PO Take 5 mg by mouth every 3 (three) hours as  needed.   05/13/2015 at Unknown time  . mupirocin ointment (BACTROBAN) 2 %    Taking  . nystatin cream (MYCOSTATIN) Apply 1 application topically 2 (two) times daily. Apply to buttocks   05/13/2015 at Unknown time  . omeprazole (PRILOSEC) 20 MG capsule Take 20 mg by mouth every other day.   12 05/13/2015 at Unknown time  . oxyCODONE (ROXICODONE) 15 MG immediate release tablet Take 15 mg by mouth every 4 (four) hours as needed for pain.   05/14/2015 at Unknown time  . potassium chloride (K-DUR) 10 MEQ tablet Take 1 tablet by mouth daily.  0 Taking  . promethazine (PHENERGAN) 25 MG tablet Take 25 mg by mouth every 8 (eight) hours as needed for nausea or vomiting.   Taking  . SANTYL ointment    Taking  . tamsulosin (FLOMAX) 0.4 MG CAPS capsule Take 0.4 mg by mouth 2 (two) times daily.   05/13/2015 at Unknown time  . VITAMIN K,  PHYTONADIONE, PO Take 1.25 mg by mouth 2 (two) times a week. Mon and friday   Past Week at Unknown time  . warfarin (COUMADIN) 1 MG tablet Take 1.5 mg by mouth daily.   05/13/2015 at Unknown time    Current medications: Current Facility-Administered Medications  Medication Dose Route Frequency Provider Last Rate Last Dose  . amLODipine (NORVASC) tablet 5 mg  5 mg Oral Daily Vaughan Basta, MD   5 mg at 05/16/15 0820  . antiseptic oral rinse (CPC / CETYLPYRIDINIUM CHLORIDE 0.05%) solution 7 mL  7 mL Mouth Rinse BID Nicholes Mango, MD      . aspirin EC tablet 81 mg  81 mg Oral Daily Vaughan Basta, MD   81 mg at 05/16/15 0820  . atenolol (TENORMIN) tablet 50 mg  50 mg Oral Daily Vaughan Basta, MD   50 mg at 05/16/15 0819  . Chlorhexidine Gluconate Cloth 2 % PADS 6 each  6 each Topical Q0600 Vaughan Basta, MD      . furosemide (LASIX) injection 20 mg  20 mg Intravenous Q8H Vaughan Basta, MD   20 mg at 05/16/15 0201  . heparin ADULT infusion 100 units/mL (25000 units/250 mL)  1,000 Units/hr Intravenous Continuous Vaughan Basta, MD 10 mL/hr at 05/16/15 0341 1,000 Units/hr at 05/16/15 0341  . insulin aspart (novoLOG) injection 0-9 Units  0-9 Units Subcutaneous TID WC Vaughan Basta, MD   5 Units at 05/16/15 0800  . insulin detemir (LEVEMIR) injection 10 Units  10 Units Subcutaneous QHS Vaughan Basta, MD   10 Units at 05/16/15 0201  . ketoconazole (NIZORAL) 2 % cream 1 application  1 application Topical BID Vaughan Basta, MD   1 application at [id number]  . magnesium oxide (MAG-OX) tablet 400 mg  400 mg Oral Daily Vaughan Basta, MD   400 mg at 05/16/15 0820  . mirtazapine (REMERON) tablet 15 mg  15 mg Oral QHS Vaughan Basta, MD   15 mg at 05/16/15 0159  . mupirocin ointment (BACTROBAN) 2 % 1 application  1 application Nasal BID Vaughan Basta, MD      . oxyCODONE (Oxy IR/ROXICODONE) immediate release tablet 15 mg   15 mg Oral Q4H PRN Vaughan Basta, MD   15 mg at 05/16/15 0159  . pantoprazole (PROTONIX) EC tablet 40 mg  40 mg Oral QAC breakfast Vaughan Basta, MD   40 mg at 05/16/15 0820  . pravastatin (PRAVACHOL) tablet 20 mg  20 mg Oral q1800 Vaughan Basta, MD      .  promethazine (PHENERGAN) tablet 25 mg  25 mg Oral Q8H PRN Vaughan Basta, MD      . sodium chloride 0.9 % injection 3 mL  3 mL Intravenous Q12H Vaughan Basta, MD   3 mL at 05/16/15 0200  . tamsulosin (FLOMAX) capsule 0.4 mg  0.4 mg Oral BID Vaughan Basta, MD   0.4 mg at 05/16/15 0820   Facility-Administered Medications Ordered in Other Encounters  Medication Dose Route Frequency Provider Last Rate Last Dose  . sodium chloride 0.9 % injection 10 mL  10 mL Intravenous PRN Evlyn Kanner, NP   10 mL at 11/02/14 [id number]      Allergies: Allergies  Allergen Reactions  . Contrast Media [Iodinated Diagnostic Agents]       Past Medical History: Past Medical History  Diagnosis Date  . Diabetes mellitus without complication ([place])   . C. difficile diarrhea   . Uterine cancer ([place])   . Hypogammaglobulinemia ([place])   . Pulmonary embolism ([place])   . Decubitus ulcers   . Hypertension   . Hypercholesterolemia   . Incontinence of urine   . Cancer [facility])     Endometrial Cancer  . Cervical cancer ([place])     with radiation 45 years ago...  . Arthritis   . GERD (gastroesophageal reflux disease)   . Bladder cancer ([place])   . Decubitus ulcer of buttock, stage 1   . Peripheral vascular disease ([place])   . Anemia      Past Surgical History: Past Surgical History  Procedure Laterality Date  . Total abdominal hysterectomy w/ bilateral salpingoophorectomy  07/29/2010  . Stent in leg  2016  . Cataract extraction, bilateral    . Bladder surgery    . Femoral endarterectomy      right  . Leg surgery      right leg  . Portacath placement      right  . Esophagogastroduodenoscopy N/A 12/14/2014     Procedure: ESOPHAGOGASTRODUODENOSCOPY (EGD);  Surgeon: Hulen Luster, MD;  Location: [facility] ENDOSCOPY;  Service: Endoscopy;  Laterality: N/A;  . Peripheral vascular catheterization Right 04/05/2015    Procedure: Lower Extremity Angiography;  Surgeon: Katha Cabal, MD;  Location: [place] CV LAB;  Service: Cardiovascular;  Laterality: Right;  . Peripheral vascular catheterization  04/05/2015    Procedure: Lower Extremity Intervention;  Surgeon: Katha Cabal, MD;  Location: [place] CV LAB;  Service: Cardiovascular;;  . Peripheral vascular catheterization Right 05/14/2015    Procedure: Lower Extremity Angiography;  Surgeon: Katha Cabal, MD;  Location: [place] CV LAB;  Service: Cardiovascular;  Laterality: Right;  . Peripheral vascular catheterization Right 05/14/2015    Procedure: Lower Extremity Intervention;  Surgeon: Katha Cabal, MD;  Location: [place] CV LAB;  Service: Cardiovascular;  Laterality: Right;  . Angioplasty       Family History: Family History  Problem Relation Age of Onset  . Hypertension Mother   . Diabetes Mellitus II Mother   . Coronary artery disease Mother   . Hypertension Father   . Diabetes Mellitus II Father   . CAD Father      Social History: Social History   Social History  . Marital Status: Widowed    Spouse Name: N/A  . Number of Children: N/A  . Years of Education: N/A   Occupational History  . Not on file.   Social History Main Topics  . Smoking status: Never Smoker   . Smokeless tobacco: Not on file  . Alcohol  Use: No  . Drug Use: No  . Sexual Activity: Not on file   Other Topics Concern  . Not on file   Social History Narrative     Review of Systems: Review of Systems  Constitutional: Negative.  Negative for fever, chills, weight loss, malaise/fatigue and diaphoresis.  HENT: Negative.   Eyes: Negative.   Respiratory: Positive for shortness of breath and wheezing. Negative for cough, hemoptysis  and sputum production.   Cardiovascular: Negative.   Gastrointestinal: Negative.   Genitourinary: Negative.        Chronic foley catheter  Musculoskeletal: Negative.   Skin: Negative.   Neurological: Positive for focal weakness. Negative for weakness.  Endo/Heme/Allergies: Negative.   Psychiatric/Behavioral: Positive for depression. Negative for suicidal ideas, hallucinations, memory loss and substance abuse. The patient is not nervous/anxious and does not have insomnia.     Vital Signs: Blood pressure 146/73, pulse 97, temperature 98.4 F (36.9 C), temperature source Oral, resp. rate 18, height 5' (1.524 m), weight 69.264 kg (152 lb 11.2 oz), SpO2 100 %.  Weight trends: Filed Weights   05/15/15 1553 05/15/15 2042  Weight: 75.5 kg (166 lb 7.2 oz) 69.264 kg (152 lb 11.2 oz)    Physical Exam: General: NAD, elderly frail female laying in bed  Head: Normocephalic, atraumatic. Moist oral mucosal membranes  Eyes: Anicteric, PERRL  Neck: Supple, trachea midline  Lungs:  Bilateral crackles  Heart: Regular rate and rhythm  Abdomen:  Soft, nontender,   Extremities:  right ischemic changes  Neurologic: Nonfocal, moving all four extremities  Skin: No lesions        Lab results: Basic Metabolic Panel:  Recent Labs Lab 05/14/15 0727 05/15/15 1611 05/16/15 0420  NA 137 139 143  K 4.1 4.3 3.6  CL 110 109 112*  CO2 19* 18* 21*  GLUCOSE 200* 556* 316*  BUN 57* 82* 82*  CREATININE 1.75* 2.18* 1.88*  CALCIUM 8.0* 8.2* 8.2*    Liver Function Tests:  Recent Labs Lab 05/15/15 1611  AST 105*  ALT 62*  ALKPHOS 32*  BILITOT 0.2*  PROT 7.5  ALBUMIN 2.7*   No results for input(s): LIPASE, AMYLASE in the last 168 hours. No results for input(s): AMMONIA in the last 168 hours.  CBC:  Recent Labs Lab 05/15/15 1611 05/16/15 0420  WBC 15.0* 12.9*  NEUTROABS 12.9*  --   HGB 8.2* 7.3*  HCT 26.8* 22.9*  MCV 84.7 84.0  PLT 620* 487*    Cardiac Enzymes:  Recent  Labs Lab 05/15/15 1611 05/15/15 2046 05/15/15 2304 05/16/15 0250  TROPONINI 14.68* 15.48* 18.60* 16.31*    BNP: Invalid input(s): POCBNP  CBG:  Recent Labs Lab 05/15/15 1845 05/15/15 2055  GLUCAP 413* 293*    Microbiology: Results for orders placed or performed during the hospital encounter of 05/15/15  MRSA PCR Screening     Status: Abnormal   Collection Time: 05/15/15  8:57 PM  Result Value Ref Range Status   MRSA by PCR POSITIVE (A) NEGATIVE Final    Comment: READ BACK AND VERIFIED BY CASSIE STEWART @0028  05/16/15.Marland KitchenMarland KitchenAJO        The GeneXpert MRSA Assay (FDA approved for NASAL specimens only), is one component of a comprehensive MRSA colonization surveillance program. It is not intended to diagnose MRSA infection nor to guide or monitor treatment for MRSA infections.     Coagulation Studies:  Recent Labs  05/14/15 0727 05/15/15 1611  LABPROT 20.4* 19.3*  INR 1.75 1.62    Urinalysis: No results  for input(s): COLORURINE, LABSPEC, [place], GLUCOSEU, HGBUR, BILIRUBINUR, KETONESUR, PROTEINUR, UROBILINOGEN, NITRITE, LEUKOCYTESUR in the last 72 hours.  Invalid input(s): APPERANCEUR    Imaging: Dg Chest Port 1 View  05/15/2015  CLINICAL DATA:  Confusion. Difficulty breathing. History of endometrial carcinoma. EXAM: PORTABLE CHEST 1 VIEW COMPARISON:  December 12, 2014 FINDINGS: There is airspace consolidation throughout much of the right mid and lower lung zones. A lesser degree of infiltrate is noted in the left base. There is also underlying interstitial edema. Heart is slightly enlarged with pulmonary venous hypertension. No adenopathy. Port-A-Cath tip is in the superior vena cava. No pneumothorax. IMPRESSION: Airspace consolidation in both lung bases as well as in the right mid lung. Underlying congestive heart failure. The airspace opacity may represent alveolar edema, although the appearance is concerning for superimposed pneumonia. Note that a nodular opacity in  the right mid lung noted previously is obscured by infiltrate currently. Electronically Signed   By: Lowella Grip III M.D.   On: 05/15/2015 16:15      Assessment & Plan: Shelley Fields is a 79 y.o. white female SNF resident with peripheral vascular disease, hypertension, diabetes mellitus type II Insulin dependent, anemia, hyperlipidemia, history of uterine cancer and bladder cancer with chronic foley catheter, who was admitted to [facility] on 05/15/2015  1. Acute renal failure on chronic kidney disease stage III: baseline creatinine range of 1.5-1.8. Presentation of creatinine of 2.88 Acute renal failure from acute coronary syndrome, acute cardiorenal syndrome from ischemic cardiomyopathy, and IV contrast exposure Chronic kidney disease secondary to vascular disease, hypertension and diabetes. Proteinuria on previous admission urinalysis.  - on benazepril for renal protection at home.  - Renal ultrasound - No acute indication for dialysis, renally dose medications  2. Hypertension: well controlled. Now with acute exacerbation of congestive heart failure. Echocardiogram pending.  - currently holding benezapril - amlodipine, atenolol, furosemide. IV furosemide for pulmonary edema  3. Diabetes Mellitus type II Insulin dependent with chronic kidney disease: no recent hemoglobin A1c available.  - Continue insulin regimen - check urine studies.   4. Anemia: hemoglobin 7.3. Normocytic With chronic kidney disease. And now with acute coronary syndrome.  - low threshold for blood transfusion - will discuss with primary team.    LOS: 1 Zona Pedro 12/8/20169:22 AM

## 2015-05-16 NOTE — Progress Notes (Signed)
RX Stewardship-  Patient received Zosyn x 1 dose in ER. No Antibiotics ordered since then.  Discussed with Dr. Margaretmary Eddy, MD does not want to continue any antibiotics currently.  Chinita Greenland PharmD Clinical Pharmacist 05/16/2015 4:18 PM

## 2015-05-16 NOTE — Progress Notes (Signed)
ANTICOAGULATION CONSULT NOTE -Follow up  Pharmacy Consult for heparin drip dosing and monitoring Indication: chest pain/ACS  Allergies  Allergen Reactions  . Contrast Media [Iodinated Diagnostic Agents]     Patient Measurements: Height: 5' (152.4 cm) Weight: 152 lb 11.2 oz (69.264 kg) IBW/kg (Calculated) : 45.5 Heparin Dosing Weight: 62.5 kg   Vital Signs: Temp: 97.7 F (36.5 C) (12/08 2039) Temp Source: Oral (12/08 2039) BP: 149/72 mmHg (12/08 2039) Pulse Rate: 92 (12/08 2039)  Labs:  Recent Labs  05/14/15 0727  05/15/15 1611 05/15/15 2046 05/15/15 2304 05/16/15 0250 05/16/15 0420 05/16/15 1218 05/16/15 1803 05/16/15 2020  HGB  --   < > 8.2*  --   --   --  7.3*  --  9.4*  --   HCT  --   --  26.8*  --   --   --  22.9*  --  29.4*  --   PLT  --   --  620*  --   --   --  487*  --   --   --   APTT  --   --   --   --  48*  --   --   --   --   --   LABPROT 20.4*  --  19.3*  --   --   --   --   --   --   --   INR 1.75  --  1.62  --   --   --   --   --   --   --   HEPARINUNFRC  --   --   --   --   --  0.17*  --  0.41  --  0.37  CREATININE 1.75*  --  2.18*  --   --   --  1.88*  --   --   --   TROPONINI  --   < > 14.68* 15.48* 18.60* 16.31*  --   --   --   --   < > = values in this interval not displayed.  Estimated Creatinine Clearance: 21.8 mL/min (by C-G formula based on Cr of 1.88).   Medical History: Past Medical History  Diagnosis Date  . Diabetes mellitus without complication ([place])   . C. difficile diarrhea   . Uterine cancer ([place])   . Hypogammaglobulinemia ([place])   . Pulmonary embolism ([place])   . Decubitus ulcers   . Hypertension   . Hypercholesterolemia   . Incontinence of urine   . Cancer [facility])     Endometrial Cancer  . Cervical cancer ([place])     with radiation 45 years ago...  . Arthritis   . GERD (gastroesophageal reflux disease)   . Bladder cancer ([place])   . Decubitus ulcer of buttock, stage 1   . Peripheral vascular disease ([place])   . Anemia     Assessment: Pharmacy consulted to dose heparin in this 79 year old female presenting with elevated troponin on 14.68 with confusion and labored breathing. According the med rec, patient was taking warfarin prior to admission but unable to determine when last dose was given. INR has been ordered and resulted at 1.62.   Heparin dosing weight 62.5 kg  Goal of Therapy:  Heparin level 0.3-0.7 units/ml Monitor platelets by anticoagulation protocol: Yes   Plan:  Heparin level subtherapeutic. 1900 unit IV x 1 bolus and increase rate to 1000 units/hr. Will recheck level in 8 hours.  12/8:  Heparin level at  1218= 0.41. Will continue current rate and recheck to verify therapeutic level in 8 hours.  12/8:  HL @ 20:00 = 0.37 Will continue this pt on current rate and recheck HL on 12/09 with AM labs.   Samson Ralph D Clinical Pharmacist 05/16/2015 9:26 PM

## 2015-05-16 NOTE — Progress Notes (Signed)
Inpatient Diabetes Program Recommendations  AACE/ADA: New Consensus Statement on Inpatient Glycemic Control (2015)  Target Ranges:  Prepandial:   less than 140 mg/dL      Peak postprandial:   less than 180 mg/dL (1-2 hours)      Critically ill patients:  140 - 180 mg/dL  Results for Shelley Fields, Shelley Fields (MRN [id number]) as of 05/16/2015 09:09  Ref. Range 05/15/2015 16:11 05/16/2015 04:20  Glucose Latest Ref Range: 65-99 mg/dL 556 (HH) 316 (H)   Results for Shelley Fields, Shelley Fields (MRN [id number]) as of 05/16/2015 09:09  Ref. Range 05/15/2015 18:45 05/15/2015 20:55  Glucose-Capillary Latest Ref Range: 65-99 mg/dL 413 (H) 293 (H)   Review of Glycemic Control  Diabetes history: DM2 Outpatient Diabetes medications: 70/30 16 units Q6PM, Novolog TID (sliding scale) Current orders for Inpatient glycemic control: Levemir 10 units QHS, Novolog 0-9 units TID with meals  Inpatient Diabetes Program Recommendations: Insulin - Basal: Please consider increasing Levemir to 14 units QHS (based on 69.2 kg x 0.2 units). Correction (SSI): Please consider ordering Novolog bedtime correction scale.  NOTE: In reviewing the chart, noted patient was seen by vascular Dr.Schiner on 05/14/15 and had angioplasty done on her right lower extremity. Patient was given Solumedrol 125 mg on 05/14/15 at 7:44 am which is contributing to noted hyperglycemia.  Thanks, Barnie Alderman, RN, MSN, CDE Diabetes Coordinator Inpatient Diabetes Program [phone] (Team Pager from [place] to [place]) [phone] (AP office) [phone] [facility] office)  [phone] [facility] office)

## 2015-05-17 ENCOUNTER — Inpatient Hospital Stay: Payer: Medicare Other

## 2015-05-17 LAB — BASIC METABOLIC PANEL
Anion gap: 11 (ref 5–15)
BUN: 78 mg/dL — AB (ref 6–20)
CALCIUM: 8.7 mg/dL — AB (ref 8.9–10.3)
CO2: 24 mmol/L (ref 22–32)
Chloride: 110 mmol/L (ref 101–111)
Creatinine, Ser: 1.81 mg/dL — ABNORMAL HIGH (ref 0.44–1.00)
GFR calc Af Amer: 30 mL/min — ABNORMAL LOW (ref >60)
GFR, EST NON AFRICAN AMERICAN: 26 mL/min — AB (ref >60)
GLUCOSE: 167 mg/dL — AB (ref 65–99)
Potassium: 3.8 mmol/L (ref 3.5–5.1)
SODIUM: 145 mmol/L (ref 135–145)

## 2015-05-17 LAB — URINALYSIS COMPLETE WITH MICROSCOPIC (ARMC ONLY)
BILIRUBIN URINE: NEGATIVE
GLUCOSE, UA: 50 mg/dL — AB
Ketones, ur: NEGATIVE mg/dL
Nitrite: NEGATIVE
Protein, ur: 100 mg/dL — AB
Specific Gravity, Urine: 1.012 (ref 1.005–1.030)
pH: 5 (ref 5.0–8.0)

## 2015-05-17 LAB — TYPE AND SCREEN
ABO/RH(D): A POS
Antibody Screen: NEGATIVE
Unit division: 0

## 2015-05-17 LAB — GLUCOSE, CAPILLARY
GLUCOSE-CAPILLARY: 143 mg/dL — AB (ref 65–99)
GLUCOSE-CAPILLARY: 186 mg/dL — AB (ref 65–99)
GLUCOSE-CAPILLARY: 190 mg/dL — AB (ref 65–99)
Glucose-Capillary: 190 mg/dL — ABNORMAL HIGH (ref 65–99)

## 2015-05-17 LAB — CBC
HCT: 30.6 % — ABNORMAL LOW (ref 35.0–47.0)
Hemoglobin: 9.6 g/dL — ABNORMAL LOW (ref 12.0–16.0)
MCH: 26.4 pg (ref 26.0–34.0)
MCHC: 31.4 g/dL — ABNORMAL LOW (ref 32.0–36.0)
MCV: 84.2 fL (ref 80.0–100.0)
PLATELETS: 554 10*3/uL — AB (ref 150–440)
RBC: 3.64 MIL/uL — ABNORMAL LOW (ref 3.80–5.20)
RDW: 17.7 % — AB (ref 11.5–14.5)
WBC: 12.6 10*3/uL — AB (ref 3.6–11.0)

## 2015-05-17 LAB — TROPONIN I
TROPONIN I: 3.95 ng/mL — AB (ref <0.031)
TROPONIN I: 4.84 ng/mL — AB (ref <0.031)

## 2015-05-17 LAB — HEPARIN LEVEL (UNFRACTIONATED): Heparin Unfractionated: 0.41 IU/mL (ref 0.30–0.70)

## 2015-05-17 LAB — HEMOGLOBIN A1C: HEMOGLOBIN A1C: 7.4 % — AB (ref 4.0–6.0)

## 2015-05-17 MED ORDER — BENAZEPRIL HCL 20 MG PO TABS
40.0000 mg | ORAL_TABLET | Freq: Every day | ORAL | Status: DC
  Administered 2015-05-17 – 2015-05-20 (×4): 40 mg via ORAL
  Filled 2015-05-17 (×4): qty 2

## 2015-05-17 MED ORDER — CLONIDINE HCL 0.1 MG PO TABS
0.1000 mg | ORAL_TABLET | Freq: Two times a day (BID) | ORAL | Status: DC
  Administered 2015-05-17 – 2015-05-21 (×8): 0.1 mg via ORAL
  Filled 2015-05-17 (×9): qty 1

## 2015-05-17 MED ORDER — MEGESTROL ACETATE 400 MG/10ML PO SUSP
200.0000 mg | Freq: Every day | ORAL | Status: DC
  Administered 2015-05-18 – 2015-05-20 (×3): 200 mg via ORAL
  Administered 2015-05-21: 400 mg via ORAL
  Administered 2015-05-22 – 2015-05-24 (×3): 200 mg via ORAL
  Filled 2015-05-17: qty 10
  Filled 2015-05-17 (×3): qty 5
  Filled 2015-05-17: qty 10
  Filled 2015-05-17: qty 5
  Filled 2015-05-17: qty 10

## 2015-05-17 NOTE — Progress Notes (Signed)
[facility] Cardiology  SUBJECTIVE: I don't have chest pain   Filed Vitals:   05/16/15 1800 05/16/15 2039 05/17/15 0619 05/17/15 1122  BP:  149/72 145/65 128/46  Pulse:  92 79 64  Temp:  97.7 F (36.5 C) 97.5 F (36.4 C) 97.5 F (36.4 C)  TempSrc:  Oral Oral Oral  Resp:  18 18 20   Height:      Weight:      SpO2: 96% 100% 100% 96%     Intake/Output Summary (Last 24 hours) at 05/17/15 1539 Last data filed at 05/17/15 1300  Gross per 24 hour  Intake 894.17 ml  Output   1950 ml  Net -1055.83 ml      PHYSICAL EXAM  General: Well developed, well nourished, in no acute distress HEENT:  Normocephalic and atramatic Neck:  No JVD.  Lungs: Clear bilaterally to auscultation and percussion. Heart: HRRR . Normal S1 and S2 without gallops or murmurs.  Abdomen: Bowel sounds are positive, abdomen soft and non-tender  Msk:  Back normal, normal gait. Normal strength and tone for age. Extremities: Gangrene of right foot Neuro: Alert and oriented X 3. Psych:  Good affect, responds appropriately   LABS: Basic Metabolic Panel:  Recent Labs  05/16/15 0420 05/17/15 0406  NA 143 145  K 3.6 3.8  CL 112* 110  CO2 21* 24  GLUCOSE 316* 167*  BUN 82* 78*  CREATININE 1.88* 1.81*  CALCIUM 8.2* 8.7*   Liver Function Tests:  Recent Labs  05/15/15 1611  AST 105*  ALT 62*  ALKPHOS 32*  BILITOT 0.2*  PROT 7.5  ALBUMIN 2.7*   No results for input(s): LIPASE, AMYLASE in the last 72 hours. CBC:  Recent Labs  05/15/15 1611 05/16/15 0420 05/16/15 1803 05/17/15 0406  WBC 15.0* 12.9*  --  12.6*  NEUTROABS 12.9*  --   --   --   HGB 8.2* 7.3* 9.4* 9.6*  HCT 26.8* 22.9* 29.4* 30.6*  MCV 84.7 84.0  --  84.2  PLT 620* 487*  --  554*   Cardiac Enzymes:  Recent Labs  05/15/15 2304 05/16/15 0250 05/17/15 1152  TROPONINI 18.60* 16.31* 3.95*   BNP: Invalid input(s): POCBNP D-Dimer: No results for input(s): DDIMER in the last 72 hours. Hemoglobin A1C:  Recent Labs   05/17/15 0406  HGBA1C 7.4*   Fasting Lipid Panel:  Recent Labs  05/16/15 0420  CHOL 131  HDL 24*  LDLCALC 88  TRIG 97  CHOLHDL 5.5   Thyroid Function Tests: No results for input(s): TSH, T4TOTAL, T3FREE, THYROIDAB in the last 72 hours.  Invalid input(s): FREET3 Anemia Panel: No results for input(s): VITAMINB12, FOLATE, FERRITIN, TIBC, IRON, RETICCTPCT in the last 72 hours.  Dg Chest 2 View  05/16/2015  CLINICAL DATA:  Pulmonary edema. EXAM: CHEST  2 VIEW COMPARISON:  05/15/2015 FINDINGS: Improved pulmonary edema. Small bilateral pleural effusion, greater on the left. Chronic cardiomegaly. Porta catheter from the right, tip still at the SVC level. Calcified peritracheal nodes on the right. There is a 21 mm nodule over the right perihilar lung which appears increased from July 2016, suspected metastatic disease in this patient with metastatic retroperitoneal adenopathy. IMPRESSION: 1. Improved pulmonary edema.  Stable small pleural effusions. 2. 2 cm right perihilar nodule which appears increased from July 2016. Electronically Signed   By: Monte Fantasia M.D.   On: 05/16/2015 13:42   Nm Pulmonary Perf And Vent  05/16/2015  CLINICAL DATA:  Pulmonary edema. EXAM: NUCLEAR MEDICINE VENTILATION - PERFUSION  LUNG SCAN TECHNIQUE: Ventilation images were obtained in multiple projections using inhaled aerosol Tc-67m DTPA. Perfusion images were obtained in multiple projections after intravenous injection of Tc-22m MAA. RADIOPHARMACEUTICALS:  32.7 Technetium-26m DTPA aerosol inhalation. No technetium 99 m MAA administered. COMPARISON:  Chest x-ray 05/26/2015. FINDINGS: No ventilation perfusion images obtained as patient would only allow ventilation for approximately 1 minute. The patient refused remainder of the exam. IMPRESSION: Patient refused exam just after ventilation.  No images obtained. Electronically Signed   By: [place]   On: 05/16/2015 14:54   Dg Chest Port 1 View  05/15/2015   CLINICAL DATA:  Confusion. Difficulty breathing. History of endometrial carcinoma. EXAM: PORTABLE CHEST 1 VIEW COMPARISON:  December 12, 2014 FINDINGS: There is airspace consolidation throughout much of the right mid and lower lung zones. A lesser degree of infiltrate is noted in the left base. There is also underlying interstitial edema. Heart is slightly enlarged with pulmonary venous hypertension. No adenopathy. Port-A-Cath tip is in the superior vena cava. No pneumothorax. IMPRESSION: Airspace consolidation in both lung bases as well as in the right mid lung. Underlying congestive heart failure. The airspace opacity may represent alveolar edema, although the appearance is concerning for superimposed pneumonia. Note that a nodular opacity in the right mid lung noted previously is obscured by infiltrate currently. Electronically Signed   By: Lowella Grip III M.D.   On: 05/15/2015 16:15     Echo normal left ventricular function, LV ejection fraction 50-55% with moderate aortic insufficiency  TELEMETRY: Normal sinus rhythm:  ASSESSMENT AND PLAN:  Principal Problem:   NSTEMI (non-ST elevated myocardial infarction) ([place]) Active Problems:   CHF (congestive heart failure) (HCC)   Pressure ulcer    1. Non-ST elevation myocardial infarction, following PTA of right common femoral artery, right external iliac artery and right peroneal artery gangrenous right foot, currently chest pain-free 2. Acute on chronic diastolic congestive heart failure, improved after diuresi 3. Gangrenous right foot, status post PTA 4. Anemia, improved after transfusion  Recommendations  1. Agree with current therapy 2. Continue heparin drip for total of 48-72 hours 3. Continue conservative management, defer cardiac catheterization in the absence of chest pain   Travon Crochet, MD, PhD, [facility] 05/17/2015 3:39 PM

## 2015-05-17 NOTE — Progress Notes (Signed)
[facility]  ROUNDING NOTE   Subjective:   Sitting up in bed. Now on room air UOP 1400 Creatinine 1.81 (2.18)  Furosemide 20mg  IV q8  PRBC transfusion 1 unit yesterday.   Objective:  Vital signs in last 24 hours:  Temp:  [97.5 F (36.4 C)-97.9 F (36.6 C)] 97.5 F (36.4 C) (12/09 0619) Pulse Rate:  [72-92] 79 (12/09 0619) Resp:  [18-19] 18 (12/09 0619) BP: (112-149)/(50-72) 145/65 mmHg (12/09 0619) SpO2:  [96 %-100 %] 100 % (12/09 0619)  Weight change:  Filed Weights   05/15/15 1553 05/15/15 2042  Weight: 75.5 kg (166 lb 7.2 oz) 69.264 kg (152 lb 11.2 oz)    Intake/Output: I/O last 3 completed shifts: In: 985.7 [I.V.:345.7; Blood:640] Out: 2400 [Urine:2400]   Intake/Output this shift:     Physical Exam: General: NAD  Head: Normocephalic, atraumatic. Moist oral mucosal membranes  Eyes: Anicteric, PERRL  Neck: Supple, trachea midline  Lungs:  Clear to auscultation  Heart: Regular rate and rhythm  Abdomen:  Soft, nontender,   Extremities:  no peripheral edema.  Neurologic: Nonfocal, moving all four extremities  Skin: No lesions  GU Chronic indwelling foely    Basic Metabolic Panel:  Recent Labs Lab 05/14/15 0727 05/15/15 1611 05/16/15 0420 05/17/15 0406  NA 137 139 143 145  K 4.1 4.3 3.6 3.8  CL 110 109 112* 110  CO2 19* 18* 21* 24  GLUCOSE 200* 556* 316* 167*  BUN 57* 82* 82* 78*  CREATININE 1.75* 2.18* 1.88* 1.81*  CALCIUM 8.0* 8.2* 8.2* 8.7*    Liver Function Tests:  Recent Labs Lab 05/15/15 1611  AST 105*  ALT 62*  ALKPHOS 32*  BILITOT 0.2*  PROT 7.5  ALBUMIN 2.7*   No results for input(s): LIPASE, AMYLASE in the last 168 hours. No results for input(s): AMMONIA in the last 168 hours.  CBC:  Recent Labs Lab 05/15/15 1611 05/16/15 0420 05/16/15 1803 05/17/15 0406  WBC 15.0* 12.9*  --  12.6*  NEUTROABS 12.9*  --   --   --   HGB 8.2* 7.3* 9.4* 9.6*  HCT 26.8* 22.9* 29.4* 30.6*  MCV 84.7 84.0  --  84.2  PLT  620* 487*  --  554*    Cardiac Enzymes:  Recent Labs Lab 05/15/15 1611 05/15/15 2046 05/15/15 2304 05/16/15 0250  TROPONINI 14.68* 15.48* 18.60* 16.31*    BNP: Invalid input(s): POCBNP  CBG:  Recent Labs Lab 05/15/15 1845 05/15/15 2055 05/16/15 1205 05/16/15 2041 05/17/15 0731  GLUCAP 413* 293* 157* 267* 143*    Microbiology: Results for orders placed or performed during the hospital encounter of 05/15/15  Blood culture (routine x 2)     Status: None (Preliminary result)   Collection Time: 05/15/15  4:11 PM  Result Value Ref Range Status   Specimen Description BLOOD RIGHT WRIST  Final   Special Requests   Final    BOTTLES DRAWN AEROBIC AND ANAEROBIC 1CC AEROBIC,1CCANA   Culture NO GROWTH 2 DAYS  Final   Report Status PENDING  Incomplete  Blood culture (routine x 2)     Status: None (Preliminary result)   Collection Time: 05/15/15  4:20 PM  Result Value Ref Range Status   Specimen Description BLOOD LEFT HAND  Final   Special Requests BOTTLES DRAWN AEROBIC AND ANAEROBIC 5CC  Final   Culture NO GROWTH 2 DAYS  Final   Report Status PENDING  Incomplete  MRSA PCR Screening     Status: Abnormal   Collection  Time: 05/15/15  8:57 PM  Result Value Ref Range Status   MRSA by PCR POSITIVE (A) NEGATIVE Final    Comment: READ BACK AND VERIFIED BY CASSIE STEWART @0028  05/16/15.Marland KitchenMarland KitchenAJO        The GeneXpert MRSA Assay (FDA approved for NASAL specimens only), is one component of a comprehensive MRSA colonization surveillance program. It is not intended to diagnose MRSA infection nor to guide or monitor treatment for MRSA infections.     Coagulation Studies:  Recent Labs  05/15/15 1611  LABPROT 19.3*  INR 1.62    Urinalysis: No results for input(s): COLORURINE, LABSPEC, PHURINE, GLUCOSEU, HGBUR, BILIRUBINUR, KETONESUR, PROTEINUR, UROBILINOGEN, NITRITE, LEUKOCYTESUR in the last 72 hours.  Invalid input(s): APPERANCEUR    Imaging: Dg Chest 2 View  05/16/2015   CLINICAL DATA:  Pulmonary edema. EXAM: CHEST  2 VIEW COMPARISON:  05/15/2015 FINDINGS: Improved pulmonary edema. Small bilateral pleural effusion, greater on the left. Chronic cardiomegaly. Porta catheter from the right, tip still at the SVC level. Calcified peritracheal nodes on the right. There is a 21 mm nodule over the right perihilar lung which appears increased from July 2016, suspected metastatic disease in this patient with metastatic retroperitoneal adenopathy. IMPRESSION: 1. Improved pulmonary edema.  Stable small pleural effusions. 2. 2 cm right perihilar nodule which appears increased from July 2016. Electronically Signed   By: Monte Fantasia M.D.   On: 05/16/2015 13:42   Nm Pulmonary Perf And Vent  05/16/2015  CLINICAL DATA:  Pulmonary edema. EXAM: NUCLEAR MEDICINE VENTILATION - PERFUSION LUNG SCAN TECHNIQUE: Ventilation images were obtained in multiple projections using inhaled aerosol Tc-53m DTPA. Perfusion images were obtained in multiple projections after intravenous injection of Tc-68m MAA. RADIOPHARMACEUTICALS:  32.7 Technetium-14m DTPA aerosol inhalation. No technetium 99 m MAA administered. COMPARISON:  Chest x-ray 05/26/2015. FINDINGS: No ventilation perfusion images obtained as patient would only allow ventilation for approximately 1 minute. The patient refused remainder of the exam. IMPRESSION: Patient refused exam just after ventilation.  No images obtained. Electronically Signed   By: Singer   On: 05/16/2015 14:54   Dg Chest Port 1 View  05/15/2015  CLINICAL DATA:  Confusion. Difficulty breathing. History of endometrial carcinoma. EXAM: PORTABLE CHEST 1 VIEW COMPARISON:  December 12, 2014 FINDINGS: There is airspace consolidation throughout much of the right mid and lower lung zones. A lesser degree of infiltrate is noted in the left base. There is also underlying interstitial edema. Heart is slightly enlarged with pulmonary venous hypertension. No adenopathy. Port-A-Cath tip is  in the superior vena cava. No pneumothorax. IMPRESSION: Airspace consolidation in both lung bases as well as in the right mid lung. Underlying congestive heart failure. The airspace opacity may represent alveolar edema, although the appearance is concerning for superimposed pneumonia. Note that a nodular opacity in the right mid lung noted previously is obscured by infiltrate currently. Electronically Signed   By: Lowella Grip III M.D.   On: 05/15/2015 16:15     Medications:   . heparin 1,000 Units/hr (05/17/15 0400)   . amLODipine  5 mg Oral Daily  . antiseptic oral rinse  7 mL Mouth Rinse BID  . aspirin EC  81 mg Oral Daily  . atenolol  50 mg Oral Daily  . Chlorhexidine Gluconate Cloth  6 each Topical Q0600  . furosemide  20 mg Intravenous Q8H  . insulin aspart  0-5 Units Subcutaneous QHS  . insulin aspart  0-9 Units Subcutaneous TID WC  . insulin detemir  14 Units Subcutaneous  QHS  . ketoconazole  1 application Topical BID  . magnesium oxide  400 mg Oral Daily  . mirtazapine  15 mg Oral QHS  . mupirocin ointment  1 application Nasal BID  . pantoprazole  40 mg Oral QAC breakfast  . pravastatin  20 mg Oral q1800  . sodium chloride  3 mL Intravenous Q12H  . tamsulosin  0.4 mg Oral BID   oxyCODONE, promethazine  Assessment/ Plan:  [name] is a 79 y.o. white female SNF resident with peripheral vascular disease, hypertension, diabetes mellitus type II Insulin dependent, anemia, hyperlipidemia, history of uterine cancer and bladder cancer with chronic foley catheter, who was admitted to [facility] on 05/15/2015  1. Acute renal failure on chronic kidney disease stage III: baseline creatinine range of 1.5-1.8. Presentation of creatinine of 2.88 Acute renal failure from acute coronary syndrome, acute cardiorenal syndrome from ischemic cardiomyopathy, and IV contrast exposure Chronic kidney disease secondary to vascular disease, hypertension and diabetes. Proteinuria on previous  admission urinalysis. urinalysis for this admission still pending.  - on benazepril for renal protection at home. May restart  - Renal ultrasound pending - No acute indication for dialysis, renally dose medications  2. Hypertension: well controlled. Now with acute exacerbation of congestive heart failure. Echocardiogram with diastolic dysfunction .  - IV furosemide - amlodipine, atenolol  3. Diabetes Mellitus type II Insulin dependent with chronic kidney disease:  hemoglobin A1c pending  - Continue insulin regimen and glucose control.  4. Anemia: hemoglobin 9.6 -status post 1 unit PRBC transfusion on 12/8 With chronic kidney disease. And now with acute coronary syndrome.    LOS: 2 Shelley Fields 12/9/20169:21 AM

## 2015-05-17 NOTE — Progress Notes (Signed)
ANTICOAGULATION CONSULT NOTE -Follow up  Pharmacy Consult for heparin drip dosing and monitoring Indication: chest pain/ACS  Allergies  Allergen Reactions  . Contrast Media [Iodinated Diagnostic Agents]     Patient Measurements: Height: 5' (152.4 cm) Weight: 152 lb 11.2 oz (69.264 kg) IBW/kg (Calculated) : 45.5 Heparin Dosing Weight: 62.5 kg   Vital Signs: Temp: 97.7 F (36.5 C) (12/08 2039) Temp Source: Oral (12/08 2039) BP: 149/72 mmHg (12/08 2039) Pulse Rate: 92 (12/08 2039)  Labs:  Recent Labs  05/14/15 0727  05/15/15 1611 05/15/15 2046 05/15/15 2304  05/16/15 0250 05/16/15 0420 05/16/15 1218 05/16/15 1803 05/16/15 2020 05/17/15 0406  HGB  --   < > 8.2*  --   --   --   --  7.3*  --  9.4*  --  9.6*  HCT  --   < > 26.8*  --   --   --   --  22.9*  --  29.4*  --  30.6*  PLT  --   --  620*  --   --   --   --  487*  --   --   --  554*  APTT  --   --   --   --  48*  --   --   --   --   --   --   --   LABPROT 20.4*  --  19.3*  --   --   --   --   --   --   --   --   --   INR 1.75  --  1.62  --   --   --   --   --   --   --   --   --   HEPARINUNFRC  --   --   --   --   --   < > 0.17*  --  0.41  --  0.37 0.41  CREATININE 1.75*  --  2.18*  --   --   --   --  1.88*  --   --   --  1.81*  TROPONINI  --   < > 14.68* 15.48* 18.60*  --  16.31*  --   --   --   --   --   < > = values in this interval not displayed.  Estimated Creatinine Clearance: 22.6 mL/min (by C-G formula based on Cr of 1.81).   Medical History: Past Medical History  Diagnosis Date  . Diabetes mellitus without complication ([place])   . C. difficile diarrhea   . Uterine cancer ([place])   . Hypogammaglobulinemia ([place])   . Pulmonary embolism ([place])   . Decubitus ulcers   . Hypertension   . Hypercholesterolemia   . Incontinence of urine   . Cancer [facility])     Endometrial Cancer  . Cervical cancer ([place])     with radiation 45 years ago...  . Arthritis   . GERD (gastroesophageal reflux disease)   . Bladder  cancer ([place])   . Decubitus ulcer of buttock, stage 1   . Peripheral vascular disease ([place])   . Anemia    Assessment: Pharmacy consulted to dose heparin in this 79 year old female presenting with elevated troponin on 14.68 with confusion and labored breathing. According the med rec, patient was taking warfarin prior to admission but unable to determine when last dose was given. INR has been ordered and resulted at 1.62.   Heparin dosing weight 62.5  kg  Goal of Therapy:  Heparin level 0.3-0.7 units/ml Monitor platelets by anticoagulation protocol: Yes   Plan:  Heparin level is therapeutic. Continue current rate. Pharmacy to monitor daily.  Laural Benes, Pharm.D., BCPS Clinical Pharmacist 05/17/2015 5:34 AM

## 2015-05-17 NOTE — Progress Notes (Signed)
Patient has been eating only bites of her food all day. Patient mentioned to RN during evening med pass that she takes something at home to help her appetite. PTA med list reviewed and revealed that pt takes a megace suspension at home. Dr. Margaretmary Eddy paged and made aware and RN was given verbal orders to restart patients home dose.

## 2015-05-17 NOTE — Progress Notes (Signed)
Handoff completed by Yasmin at 04:30, Shelley Fields has resumed care

## 2015-05-17 NOTE — Progress Notes (Signed)
   05/17/15 1500  Clinical Encounter Type  Visited With Patient  Visit Type Initial  Referral From Nurse  Consult/Referral To Chaplain  Spiritual Encounters  Spiritual Needs Emotional  Stress Factors  Patient Stress Factors Exhausted;Health changes  Met w/patient to provide spiritual presence. Pt. was in and out of sleep, slightly disoriented and confused when medical providers changed bed. Became alert and animated when friend visited.  Chap. Kitiara Hintze G. [place]

## 2015-05-17 NOTE — Progress Notes (Signed)
[facility] NAME: Shelley Fields    MR#:  [id number]  DATE OF BIRTH:  07-19-1935  SUBJECTIVE:  CHIEF COMPLAINT:  Patient is resting comfortably. No overnight events Denies any chest pain or shortness of breath at this time. She is on heparin drip.   REVIEW OF SYSTEMS:  CONSTITUTIONAL: No fever, fatigue or weakness.  EYES: No blurred or double vision.  EARS, NOSE, AND THROAT: No tinnitus or ear pain.  RESPIRATORY: No cough, shortness of breath, wheezing or hemoptysis.  CARDIOVASCULAR: No chest pain, orthopnea, edema.  GASTROINTESTINAL: No nausea, vomiting, diarrhea or abdominal pain.  GENITOURINARY: No dysuria, hematuria.  ENDOCRINE: No polyuria, nocturia,  HEMATOLOGY: No anemia, easy bruising or bleeding SKIN: No rash or lesion. MUSCULOSKELETAL: No joint pain or arthritis. Has chronic peripheral vascular disease and had angioplasty for right foot gangrene recently  NEUROLOGIC: No tingling, numbness, weakness.  PSYCHIATRY: No anxiety or depression.   DRUG ALLERGIES:   Allergies  Allergen Reactions  . Contrast Media [Iodinated Diagnostic Agents]     VITALS:  Blood pressure 128/46, pulse 64, temperature 97.5 F (36.4 C), temperature source Oral, resp. rate 20, height 5' (1.524 m), weight 69.264 kg (152 lb 11.2 oz), SpO2 96 %.  PHYSICAL EXAMINATION:  GENERAL:  79 y.o.-year-old patient lying in the bed with no acute distress.  EYES: Pupils equal, round, reactive to light and accommodation. No scleral icterus. Extraocular muscles intact.  HEENT: Head atraumatic, normocephalic. Oropharynx and nasopharynx clear.  NECK:  Supple, no jugular venous distention. No thyroid enlargement, no tenderness.  LUNGS: Normal breath sounds bilaterally, no wheezing, rales,rhonchi or crepitation. No use of accessory muscles of respiration.  CARDIOVASCULAR: S1, S2 normal. No murmurs, rubs, or gallops.  ABDOMEN: Soft, nontender, nondistended.  Bowel sounds present. No organomegaly or mass.  EXTREMITIES: No pedal edema, cyanosis, or clubbing. Diminished peripheral pulses NEUROLOGIC: Cranial nerves II through XII are intact. Muscle strength 5/5 in all extremities. Sensation intact. Gait not checked.  PSYCHIATRIC: The patient is alert and oriented x 3.  SKIN: No obvious rash, lesion, or ulcer.    LABORATORY PANEL:   CBC  Recent Labs Lab 05/17/15 0406  WBC 12.6*  HGB 9.6*  HCT 30.6*  PLT 554*   ------------------------------------------------------------------------------------------------------------------  Chemistries   Recent Labs Lab 05/15/15 1611  05/17/15 0406  NA 139  < > 145  K 4.3  < > 3.8  CL 109  < > 110  CO2 18*  < > 24  GLUCOSE 556*  < > 167*  BUN 82*  < > 78*  CREATININE 2.18*  < > 1.81*  CALCIUM 8.2*  < > 8.7*  AST 105*  --   --   ALT 62*  --   --   ALKPHOS 32*  --   --   BILITOT 0.2*  --   --   < > = values in this interval not displayed. ------------------------------------------------------------------------------------------------------------------  Cardiac Enzymes  Recent Labs Lab 05/17/15 1152  TROPONINI 3.95*   ------------------------------------------------------------------------------------------------------------------  RADIOLOGY:  Dg Chest 2 View  05/16/2015  CLINICAL DATA:  Pulmonary edema. EXAM: CHEST  2 VIEW COMPARISON:  05/15/2015 FINDINGS: Improved pulmonary edema. Small bilateral pleural effusion, greater on the left. Chronic cardiomegaly. Porta catheter from the right, tip still at the SVC level. Calcified peritracheal nodes on the right. There is a 21 mm nodule over the right perihilar lung which appears increased from July 2016, suspected metastatic disease in this patient with metastatic  retroperitoneal adenopathy. IMPRESSION: 1. Improved pulmonary edema.  Stable small pleural effusions. 2. 2 cm right perihilar nodule which appears increased from July 2016.  Electronically Signed   By: Monte Fantasia M.D.   On: 05/16/2015 13:42   Nm Pulmonary Perf And Vent  05/16/2015  CLINICAL DATA:  Pulmonary edema. EXAM: NUCLEAR MEDICINE VENTILATION - PERFUSION LUNG SCAN TECHNIQUE: Ventilation images were obtained in multiple projections using inhaled aerosol Tc-21m DTPA. Perfusion images were obtained in multiple projections after intravenous injection of Tc-59m MAA. RADIOPHARMACEUTICALS:  32.7 Technetium-48m DTPA aerosol inhalation. No technetium 99 m MAA administered. COMPARISON:  Chest x-ray 05/26/2015. FINDINGS: No ventilation perfusion images obtained as patient would only allow ventilation for approximately 1 minute. The patient refused remainder of the exam. IMPRESSION: Patient refused exam just after ventilation.  No images obtained. Electronically Signed   By: Marcello Moores  Register   On: 05/16/2015 14:54    EKG:   Orders placed or performed during the hospital encounter of 05/15/15  . ED EKG  . ED EKG    ASSESSMENT AND PLAN:   * Non-ST elevation MI Patient is currently asymptomatic , continue Heparin IV drip for 48-72 hours Cardiology is defering   Cardiaccath eterization  Continue Aspirin, statin  lipid panel,  monitor on telemetry Serial troponins 18.6-16.31 Pending echocardiogram  Appreciate cardiologist recommendations  * Acute on chronic Congestive heart failure We do not have previous echocardiogram to check her ejection fraction. Will get an echocardiogram  Continue IV Lasix  Intake and output measurement  close monitoring of renal function is she has worsening renal function also.  *Anemia with hemoglobin 7.3 in the setting of non-STEMI Status post  1 unit of blood transfusion and repeat hemoglobin at 9.6  * Acute on chronic renal failure Baseline creatinine was around 1.3 for 5 month ago, now it is more than 2.18-->1.8 Patient is on IV Lasix for acute on chronic CHF,  monitor renal function,: Nephrology consult is placed Hold  ACE inhibitor and try to maintain the blood pressures    * History of pulmonary embolism This is as per the previous records but patient is not aware about any kind of blood clots, though she was taking Coumadin at home. Her INR is subtherapeutic , hold Coumadin for now Patient is on heparin drip for non-STEMI   * Hyperglycemia with diabetes Continue 10 units of Levemir   on sliding scale coverage.  * Hypertension She is on multiple antihypertensive medications but her blood pressure is running between [id number] to [id number] systolic range, Continue amlodipine, atenolol and Lasix IV        All the records are reviewed and case discussed with Care Management/Social Workerr. Management plans discussed with the patient, family and they are in agreement.  CODE STATUS: DO NOT RESUSCITATE  TOTAL CRITICAL CARE TIME TAKING CARE OF THIS PATIENT: 35 minutes.   POSSIBLE D/C IN 1-2 DAYS, DEPENDING ON CLINICAL CONDITION.   Nicholes Mango M.D on 05/17/2015 at 4:50 PM  Between 7am to 6pm - Pager - [phone] After 6pm go to [email or web address] - password [email or web address] [facility]  Office  [phone]  CC: Primary care physician; [name], MD

## 2015-05-17 NOTE — Progress Notes (Signed)
Air-overlay mattress installed in room and set to turn patient q30 minutes; Q2 turn orders will therefore be discontinued.

## 2015-05-17 NOTE — Consult Note (Signed)
WOC wound consult note Reason for Consult:Unstageable pressure injury to sacrum, present on admission.  Shearing injury to right buttock, present on admission.  Gangrenous changes to left foot, involving metatarsals and dorsal foot.  Wound type: Vascular insufficiency with recent angiogram. LEft foot gangrenous Sacral unstageable pressure injury and linear, shearing, full thickness injury to right buttocks.   Currently resides in SNF.   Pressure Ulcer POA: Yes  Measurement: sacrum 4 cm x 2 cm x 0.8 cm wound bed covered in adherent yellow slough Right buttock 3 cm x 0.5 cm x 0.3 cm slough in wound bed (Moisture Associated and shearing injury, not located over a bony prominence) LEft foot is cool and black.  Malodorous.  Dry.   Wound bed: slough Drainage (amount, consistency, odor)Minimal serosanguinous drainage to buttocks and sacral wounds. Foot is dry.  Foot has necrotic odor.  Buttocks wounds have no odor.   Periwound:Intact Dressing procedure/placement/frequency:Cleanse wounds to sacrum and buttocks with NS and pat gently dry.  Apply calcium alginate to wound bed.  Cover with ABD pad and tape.  Change daily.  Paint left foot/toes with Betadine daily.  Leave open to air.  Will not follow at this time.  Please re-consult if needed.  Domenic Moras RN BSN [place] Pager [phone]

## 2015-05-18 LAB — GLUCOSE, CAPILLARY
GLUCOSE-CAPILLARY: 201 mg/dL — AB (ref 65–99)
GLUCOSE-CAPILLARY: 217 mg/dL — AB (ref 65–99)
Glucose-Capillary: 185 mg/dL — ABNORMAL HIGH (ref 65–99)

## 2015-05-18 LAB — CBC
HEMATOCRIT: 27.9 % — AB (ref 35.0–47.0)
HEMOGLOBIN: 9.1 g/dL — AB (ref 12.0–16.0)
MCH: 27.5 pg (ref 26.0–34.0)
MCHC: 32.8 g/dL (ref 32.0–36.0)
MCV: 83.8 fL (ref 80.0–100.0)
PLATELETS: 468 10*3/uL — AB (ref 150–440)
RBC: 3.32 MIL/uL — AB (ref 3.80–5.20)
RDW: 17.8 % — ABNORMAL HIGH (ref 11.5–14.5)
WBC: 10.1 10*3/uL (ref 3.6–11.0)

## 2015-05-18 LAB — HEPARIN LEVEL (UNFRACTIONATED): Heparin Unfractionated: 0.29 IU/mL — ABNORMAL LOW (ref 0.30–0.70)

## 2015-05-18 MED ORDER — HEPARIN BOLUS VIA INFUSION
900.0000 [IU] | Freq: Once | INTRAVENOUS | Status: AC
  Administered 2015-05-18: 900 [IU] via INTRAVENOUS
  Filled 2015-05-18: qty 900

## 2015-05-18 NOTE — Progress Notes (Signed)
ANTICOAGULATION CONSULT NOTE -Follow up  Pharmacy Consult for heparin drip dosing and monitoring Indication: chest pain/ACS  Allergies  Allergen Reactions  . Contrast Media [Iodinated Diagnostic Agents]     Patient Measurements: Height: 5' (152.4 cm) Weight: 152 lb 11.2 oz (69.264 kg) IBW/kg (Calculated) : 45.5 Heparin Dosing Weight: 62.5 kg   Vital Signs: Temp: 97.8 F (36.6 C) (12/10 0807) Temp Source: Oral (12/10 0807) BP: 128/43 mmHg (12/10 0807) Pulse Rate: 62 (12/10 0807)  Labs:  Recent Labs  05/15/15 1611  05/15/15 2304 05/16/15 0250 05/16/15 0420  05/16/15 1803 05/16/15 2020 05/17/15 0406 05/17/15 1152 05/17/15 1734 05/18/15 0617  HGB 8.2*  --   --   --  7.3*  --  9.4*  --  9.6*  --   --  9.1*  HCT 26.8*  --   --   --  22.9*  --  29.4*  --  30.6*  --   --  27.9*  PLT 620*  --   --   --  487*  --   --   --  554*  --   --  468*  APTT  --   --  48*  --   --   --   --   --   --   --   --   --   LABPROT 19.3*  --   --   --   --   --   --   --   --   --   --   --   INR 1.62  --   --   --   --   --   --   --   --   --   --   --   HEPARINUNFRC  --   --   --  0.17*  --   < >  --  0.37 0.41  --   --  0.29*  CREATININE 2.18*  --   --   --  1.88*  --   --   --  1.81*  --   --   --   TROPONINI 14.68*  < > 18.60* 16.31*  --   --   --   --   --  3.95* 4.84*  --   < > = values in this interval not displayed.  Estimated Creatinine Clearance: 22.6 mL/min (by C-G formula based on Cr of 1.81).   Medical History: Past Medical History  Diagnosis Date  . Diabetes mellitus without complication ([place])   . C. difficile diarrhea   . Uterine cancer ([place])   . Hypogammaglobulinemia ([place])   . Pulmonary embolism ([place])   . Decubitus ulcers   . Hypertension   . Hypercholesterolemia   . Incontinence of urine   . Cancer [facility])     Endometrial Cancer  . Cervical cancer ([place])     with radiation 45 years ago...  . Arthritis   . GERD (gastroesophageal reflux disease)   . Bladder  cancer ([place])   . Decubitus ulcer of buttock, stage 1   . Peripheral vascular disease ([place])   . Anemia    Assessment: Pharmacy consulted to dose heparin in this 79 year old female presenting with elevated troponin on 14.68 with confusion and labored breathing. According the med rec, patient was taking warfarin prior to admission but unable to determine when last dose was given. INR has been ordered and resulted at 1.62.   Heparin dosing weight 62.5 kg  Goal of Therapy:  Heparin level 0.3-0.7 units/ml Monitor platelets by anticoagulation protocol: Yes   Plan:  Heparin level is therapeutic. Continue current rate. Pharmacy to monitor daily.  12/10: Heparin level resulted @ 0.29, which is slightly below goal range. Will increase heparin gtt to 1150 units/hr and will give heparin bolus of 900 units. Will order heparin level for 17:30 on 12/10.   Mirabella Hilario D, Pharm.D., BCPS Clinical Pharmacist 05/18/2015 8:58 AM

## 2015-05-18 NOTE — Progress Notes (Addendum)
Dressing on sacrum changed, foot cleansed. Patient alert and oriented x1, no complaints at this time. vss at this time. Patient NSR on telemetry. Will continue to assess. Shelley Fields

## 2015-05-18 NOTE — Progress Notes (Signed)
[facility]  ROUNDING NOTE   Subjective:  No acute complaints today Most recent creatinine from yesterday is 1.81 which is about the same as prior day 1.88 Ultrasound shows that left kidney smaller than right kidney suspicious of vascular disease Urine output is good at 2150 cc No complaint of shortness of breath .   Objective:  Vital signs in last 24 hours:  Temp:  [97.7 F (36.5 C)-98.1 F (36.7 C)] 98.1 F (36.7 C) (12/10 1109) Pulse Rate:  [60-70] 60 (12/10 1109) Resp:  [18-20] 20 (12/10 1109) BP: (121-134)/(43-60) 121/52 mmHg (12/10 1109) SpO2:  [94 %-99 %] 94 % (12/10 1109)  Weight change:  Filed Weights   05/15/15 1553 05/15/15 2042  Weight: 75.5 kg (166 lb 7.2 oz) 69.264 kg (152 lb 11.2 oz)    Intake/Output: I/O last 3 completed shifts: In: 601 [P.O.:240; I.V.:361] Out: 3050 [Urine:3050]   Intake/Output this shift:  Total I/O In: -  Out: 820 [Urine:770; Stool:50]  Physical Exam: General: NAD  Head: Normocephalic, atraumatic. Moist oral mucosal membranes  Eyes: Anicteric,  Neck: Supple, trachea midline  Lungs:  Clear to auscultation  Heart: Regular rate and rhythm  Abdomen:  Soft, nontender,   Extremities:  no peripheral edema. right foot gangrene with demarcation of her midfoot   Neurologic: Nonfocal, moving all four extremities  Skin: No lesions  GU Chronic indwelling foely    Basic Metabolic Panel:  Recent Labs Lab 05/14/15 0727 05/15/15 1611 05/16/15 0420 05/17/15 0406  NA 137 139 143 145  K 4.1 4.3 3.6 3.8  CL 110 109 112* 110  CO2 19* 18* 21* 24  GLUCOSE 200* 556* 316* 167*  BUN 57* 82* 82* 78*  CREATININE 1.75* 2.18* 1.88* 1.81*  CALCIUM 8.0* 8.2* 8.2* 8.7*    Liver Function Tests:  Recent Labs Lab 05/15/15 1611  AST 105*  ALT 62*  ALKPHOS 32*  BILITOT 0.2*  PROT 7.5  ALBUMIN 2.7*   No results for input(s): LIPASE, AMYLASE in the last 168 hours. No results for input(s): AMMONIA in the last 168  hours.  CBC:  Recent Labs Lab 05/15/15 1611 05/16/15 0420 05/16/15 1803 05/17/15 0406 05/18/15 0617  WBC 15.0* 12.9*  --  12.6* 10.1  NEUTROABS 12.9*  --   --   --   --   HGB 8.2* 7.3* 9.4* 9.6* 9.1*  HCT 26.8* 22.9* 29.4* 30.6* 27.9*  MCV 84.7 84.0  --  84.2 83.8  PLT 620* 487*  --  554* 468*    Cardiac Enzymes:  Recent Labs Lab 05/15/15 2046 05/15/15 2304 05/16/15 0250 05/17/15 1152 05/17/15 1734  TROPONINI 15.48* 18.60* 16.31* 3.95* 4.84*    BNP: Invalid input(s): POCBNP  CBG:  Recent Labs Lab 05/17/15 0731 05/17/15 1120 05/17/15 1641 05/17/15 2104 05/18/15 1158  GLUCAP 143* 186* 190* 190* 185*    Microbiology: Results for orders placed or performed during the hospital encounter of 05/15/15  Blood culture (routine x 2)     Status: None (Preliminary result)   Collection Time: 05/15/15  4:11 PM  Result Value Ref Range Status   Specimen Description BLOOD RIGHT WRIST  Final   Special Requests   Final    BOTTLES DRAWN AEROBIC AND ANAEROBIC 1CC AEROBIC,1CCANA   Culture NO GROWTH 3 DAYS  Final   Report Status PENDING  Incomplete  Blood culture (routine x 2)     Status: None (Preliminary result)   Collection Time: 05/15/15  4:20 PM  Result Value Ref Range Status  Specimen Description BLOOD LEFT HAND  Final   Special Requests BOTTLES DRAWN AEROBIC AND ANAEROBIC 5CC  Final   Culture NO GROWTH 3 DAYS  Final   Report Status PENDING  Incomplete  MRSA PCR Screening     Status: Abnormal   Collection Time: 05/15/15  8:57 PM  Result Value Ref Range Status   MRSA by PCR POSITIVE (A) NEGATIVE Final    Comment: READ BACK AND VERIFIED BY CASSIE STEWART @0028  05/16/15.Marland KitchenMarland KitchenAJO        The GeneXpert MRSA Assay (FDA approved for NASAL specimens only), is one component of a comprehensive MRSA colonization surveillance program. It is not intended to diagnose MRSA infection nor to guide or monitor treatment for MRSA infections.     Coagulation Studies:  Recent  Labs  05/15/15 1611  LABPROT 19.3*  INR 1.62    Urinalysis:  Recent Labs  05/17/15 1052  COLORURINE YELLOW*  LABSPEC 1.012  PHURINE 5.0  GLUCOSEU 50*  HGBUR 2+*  BILIRUBINUR NEGATIVE  KETONESUR NEGATIVE  PROTEINUR 100*  NITRITE NEGATIVE  LEUKOCYTESUR 3+*      Imaging: US Renal  05/17/2015  CLINICAL DATA:  Stage IV chronic renal disease EXAM: RENAL ULTRASOUND COMPARISON:  CT abdomen and pelvis November 21, 2014 FINDINGS: Right Kidney: Length: 11.5 cm. Echogenicity within normal limits. There is renal cortical thinning. No mass, perinephric fluid, or hydronephrosis visualized. There is focal scarring along the periphery of the lower pole of the right kidney. There is no sonographically demonstrable calculus or ureterectasis. Left Kidney: Length: 8.3 cm. Echogenicity within normal limits. There is renal cortical thinning. No mass, perinephric fluid, or hydronephrosis visualized. No sonographically demonstrable calculus or ureterectasis. Bladder: Urinary bladder is decompressed and cannot be assessed at this time. IMPRESSION: Bilateral renal cortical thinning, a finding that may be seen with medical renal disease. The renal echogenicity is felt to be within normal limits. No obstructing focus identified on either side. There is scarring in the lower pole right kidney region. There is size discrepancy between the kidneys with left kidney smaller than the right. This finding potentially may be indicative of renal artery stenosis on the left. In this regard, question whether patient is hypertensive. Electronically Signed   By: Lowella Grip III M.D.   On: 05/17/2015 17:17     Medications:     . amLODipine  5 mg Oral Daily  . antiseptic oral rinse  7 mL Mouth Rinse BID  . aspirin EC  81 mg Oral Daily  . atenolol  50 mg Oral Daily  . benazepril  40 mg Oral Daily  . Chlorhexidine Gluconate Cloth  6 each Topical Q0600  . cloNIDine  0.1 mg Oral BID  . furosemide  20 mg Intravenous Q8H   . insulin aspart  0-5 Units Subcutaneous QHS  . insulin aspart  0-9 Units Subcutaneous TID WC  . insulin detemir  14 Units Subcutaneous QHS  . ketoconazole  1 application Topical BID  . magnesium oxide  400 mg Oral Daily  . megestrol  200 mg Oral Daily  . mirtazapine  15 mg Oral QHS  . mupirocin ointment  1 application Nasal BID  . pantoprazole  40 mg Oral QAC breakfast  . pravastatin  20 mg Oral q1800  . sodium chloride  3 mL Intravenous Q12H  . tamsulosin  0.4 mg Oral BID   oxyCODONE, promethazine  Assessment/ Plan:  Shelley Fields is a 79 y.o. white female SNF resident with peripheral vascular disease, hypertension, diabetes  mellitus type II Insulin dependent, anemia, hyperlipidemia, history of uterine cancer and bladder cancer with chronic foley catheter, who was admitted to [facility] on 05/15/2015  1. Acute renal failure on chronic kidney disease stage III: baseline creatinine range of 1.5-1.8. Presentation of creatinine of 2.88 Acute renal failure from acute coronary syndrome, acute cardiorenal syndrome from ischemic cardiomyopathy, and IV contrast exposure Chronic kidney disease secondary to vascular disease, hypertension and diabetes. Proteinuria on previous admission urinalysis. urinalysis this admission shows LE 3+, protein, TNTC RBCs, TNTC WBCs - on benazepril for renal protection   - Renal ultrasound report noted - No acute indication for dialysis, renally dose medications  2. Hypertension: well controlled. Now with acute exacerbation of congestive heart failure. Echocardiogram with diastolic dysfunction .  - IV furosemide - amlodipine, atenolol  3. Diabetes Mellitus type II Insulin dependent with chronic kidney disease:  hemoglobin A1c 7.4%  - Continue insulin regimen and glucose control.  4. Anemia: hemoglobin 9.1 -status post 1 unit PRBC transfusion on 12/8 With chronic kidney disease. And now with acute coronary syndrome.    LOS: 3 Thanh Pomerleau 12/10/20163:15  PM

## 2015-05-18 NOTE — Progress Notes (Signed)
[facility] Cardiology  SUBJECTIVE: No chest apin   Filed Vitals:   05/17/15 1122 05/17/15 2102 05/18/15 0622 05/18/15 0807  BP: 128/46 130/53 134/60 128/43  Pulse: 64 69 70 62  Temp: 97.5 F (36.4 C) 97.9 F (36.6 C) 97.7 F (36.5 C) 97.8 F (36.6 C)  TempSrc: Oral  Oral Oral  Resp: 20 18 18 20   Height:      Weight:      SpO2: 96% 96% 97% 99%     Intake/Output Summary (Last 24 hours) at 05/18/15 1050 Last data filed at 05/18/15 0800  Gross per 24 hour  Intake    480 ml  Output   1650 ml  Net  -1170 ml      PHYSICAL EXAM  General: Well developed, well nourished, in no acute distress HEENT:  Normocephalic and atramatic Neck:  No JVD.  Lungs: Clear bilaterally to auscultation and percussion. Heart: HRRR . Normal S1 and S2 without gallops or murmurs.  Abdomen: Bowel sounds are positive, abdomen soft and non-tender  Msk:  Back normal, normal gait. Normal strength and tone for age. Extremities: Gangrenous right foot  Neuro: Alert and oriented X 3. Psych:  Good affect, responds appropriately   LABS: Basic Metabolic Panel:  Recent Labs  05/16/15 0420 05/17/15 0406  NA 143 145  K 3.6 3.8  CL 112* 110  CO2 21* 24  GLUCOSE 316* 167*  BUN 82* 78*  CREATININE 1.88* 1.81*  CALCIUM 8.2* 8.7*   Liver Function Tests:  Recent Labs  05/15/15 1611  AST 105*  ALT 62*  ALKPHOS 32*  BILITOT 0.2*  PROT 7.5  ALBUMIN 2.7*   No results for input(s): LIPASE, AMYLASE in the last 72 hours. CBC:  Recent Labs  05/15/15 1611  05/17/15 0406 05/18/15 0617  WBC 15.0*  < > 12.6* 10.1  NEUTROABS 12.9*  --   --   --   HGB 8.2*  < > 9.6* 9.1*  HCT 26.8*  < > 30.6* 27.9*  MCV 84.7  < > 84.2 83.8  PLT 620*  < > 554* 468*  < > = values in this interval not displayed. Cardiac Enzymes:  Recent Labs  05/16/15 0250 05/17/15 1152 05/17/15 1734  TROPONINI 16.31* 3.95* 4.84*   BNP: Invalid input(s): POCBNP D-Dimer: No results for input(s): DDIMER in the last 72  hours. Hemoglobin A1C:  Recent Labs  05/17/15 0406  HGBA1C 7.4*   Fasting Lipid Panel:  Recent Labs  05/16/15 0420  CHOL 131  HDL 24*  LDLCALC 88  TRIG 97  CHOLHDL 5.5   Thyroid Function Tests: No results for input(s): TSH, T4TOTAL, T3FREE, THYROIDAB in the last 72 hours.  Invalid input(s): FREET3 Anemia Panel: No results for input(s): VITAMINB12, FOLATE, FERRITIN, TIBC, IRON, RETICCTPCT in the last 72 hours.  Dg Chest 2 View  05/16/2015  CLINICAL DATA:  Pulmonary edema. EXAM: CHEST  2 VIEW COMPARISON:  05/15/2015 FINDINGS: Improved pulmonary edema. Small bilateral pleural effusion, greater on the left. Chronic cardiomegaly. Porta catheter from the right, tip still at the SVC level. Calcified peritracheal nodes on the right. There is a 21 mm nodule over the right perihilar lung which appears increased from July 2016, suspected metastatic disease in this patient with metastatic retroperitoneal adenopathy. IMPRESSION: 1. Improved pulmonary edema.  Stable small pleural effusions. 2. 2 cm right perihilar nodule which appears increased from July 2016. Electronically Signed   By: Monte Fantasia M.D.   On: 05/16/2015 13:42   US Renal  05/17/2015  CLINICAL DATA:  Stage IV chronic renal disease EXAM: RENAL ULTRASOUND COMPARISON:  CT abdomen and pelvis November 21, 2014 FINDINGS: Right Kidney: Length: 11.5 cm. Echogenicity within normal limits. There is renal cortical thinning. No mass, perinephric fluid, or hydronephrosis visualized. There is focal scarring along the periphery of the lower pole of the right kidney. There is no sonographically demonstrable calculus or ureterectasis. Left Kidney: Length: 8.3 cm. Echogenicity within normal limits. There is renal cortical thinning. No mass, perinephric fluid, or hydronephrosis visualized. No sonographically demonstrable calculus or ureterectasis. Bladder: Urinary bladder is decompressed and cannot be assessed at this time. IMPRESSION: Bilateral renal  cortical thinning, a finding that may be seen with medical renal disease. The renal echogenicity is felt to be within normal limits. No obstructing focus identified on either side. There is scarring in the lower pole right kidney region. There is size discrepancy between the kidneys with left kidney smaller than the right. This finding potentially may be indicative of renal artery stenosis on the left. In this regard, question whether patient is hypertensive. Electronically Signed   By: Lowella Grip III M.D.   On: 05/17/2015 17:17   Nm Pulmonary Perf And Vent  05/16/2015  CLINICAL DATA:  Pulmonary edema. EXAM: NUCLEAR MEDICINE VENTILATION - PERFUSION LUNG SCAN TECHNIQUE: Ventilation images were obtained in multiple projections using inhaled aerosol Tc-71m DTPA. Perfusion images were obtained in multiple projections after intravenous injection of Tc-61m MAA. RADIOPHARMACEUTICALS:  32.7 Technetium-59m DTPA aerosol inhalation. No technetium 99 m MAA administered. COMPARISON:  Chest x-ray 05/26/2015. FINDINGS: No ventilation perfusion images obtained as patient would only allow ventilation for approximately 1 minute. The patient refused remainder of the exam. IMPRESSION: Patient refused exam just after ventilation.  No images obtained. Electronically Signed   By: Marcello Moores  Register   On: 05/16/2015 14:54     Echo overall normal left ventricular function with LVEF of 50-55%, moderate aortic insufficiency  TELEMETRY: Sinus rhythm:  ASSESSMENT AND PLAN:  Principal Problem:   NSTEMI (non-ST elevated myocardial infarction) ([place]) Active Problems:   CHF (congestive heart failure) (HCC)   Pressure ulcer    1. Non-ST elevation myocardial infarction, in the absence of chest pain, following PTA of right common femoral artery, right external iliac artery and right peroneal artery for tenderness right foot, been on heparin greater than 48 hours 2. Diastolic CHF, improved after diuresis 3. Gangrenous right  foot 4. Chronic kidney disease 5. Severe anemia improved after transfusion  Recommendations  1. DC heparin 2. Continue aspirin 3. Defer starting Plavix recent anemia 4. Defer further cardiac diagnostics at this time  Signed off for now, please call if any questions   Jauna Raczynski, MD, PhD, [facility] 05/18/2015 10:50 AM

## 2015-05-19 LAB — GLUCOSE, CAPILLARY
GLUCOSE-CAPILLARY: 279 mg/dL — AB (ref 65–99)
GLUCOSE-CAPILLARY: 234 mg/dL — AB (ref 65–99)
GLUCOSE-CAPILLARY: 217 mg/dL — AB (ref 65–99)
Glucose-Capillary: 190 mg/dL — ABNORMAL HIGH (ref 65–99)

## 2015-05-19 MED ORDER — INSULIN DETEMIR 100 UNIT/ML ~~LOC~~ SOLN
25.0000 [IU] | Freq: Every day | SUBCUTANEOUS | Status: DC
  Administered 2015-05-19: 25 [IU] via SUBCUTANEOUS
  Filled 2015-05-19 (×2): qty 0.25

## 2015-05-19 MED ORDER — HEPARIN SODIUM (PORCINE) 5000 UNIT/ML IJ SOLN
5000.0000 [IU] | Freq: Three times a day (TID) | INTRAMUSCULAR | Status: DC
  Administered 2015-05-19 – 2015-05-24 (×14): 5000 [IU] via SUBCUTANEOUS
  Filled 2015-05-19 (×4): qty 1
  Filled 2015-05-19: qty 2
  Filled 2015-05-19 (×8): qty 1

## 2015-05-19 MED ORDER — DEXTROSE 5 % IV SOLN
1.0000 g | INTRAVENOUS | Status: AC
  Administered 2015-05-19 – 2015-05-23 (×5): 1 g via INTRAVENOUS
  Filled 2015-05-19 (×5): qty 10

## 2015-05-19 MED ORDER — FUROSEMIDE 20 MG PO TABS
20.0000 mg | ORAL_TABLET | Freq: Every day | ORAL | Status: DC
  Administered 2015-05-20 – 2015-05-24 (×5): 20 mg via ORAL
  Filled 2015-05-19 (×5): qty 1

## 2015-05-19 NOTE — Progress Notes (Signed)
[facility]  ROUNDING NOTE   Subjective:  No acute complaints today Most recent creatinine is 1.81 which is about the same as prior day 1.88. No new labs today Ultrasound shows that left kidney smaller than right kidney suspicious of vascular disease Urine output is good at 1450 cc No complaint of shortness of breath .   Objective:  Vital signs in last 24 hours:  Temp:  [97.6 F (36.4 C)-97.8 F (36.6 C)] 97.6 F (36.4 C) (12/11 1152) Pulse Rate:  [56-83] 83 (12/11 1152) Resp:  [18-20] 18 (12/11 1152) BP: (110-137)/(42-92) 110/42 mmHg (12/11 1152) SpO2:  [89 %-99 %] 89 % (12/11 1152)  Weight change:  Filed Weights   05/15/15 1553 05/15/15 2042  Weight: 75.5 kg (166 lb 7.2 oz) 69.264 kg (152 lb 11.2 oz)    Intake/Output: I/O last 3 completed shifts: In: 853.5 [P.O.:720; I.V.:133.5] Out: 2245 [Urine:2195; Stool:50]   Intake/Output this shift:  Total I/O In: -  Out: 100 [Urine:100]  Physical Exam: General: NAD  Head: Normocephalic, atraumatic. Moist oral mucosal membranes  Eyes: Anicteric,  Neck: Supple, trachea midline  Lungs:  Clear to auscultation  Heart: Regular rate and rhythm  Abdomen:  Soft, nontender,   Extremities:  no peripheral edema. right foot gangrene with demarcation of her midfoot   Neurologic: Nonfocal, moving all four extremities  Skin: No lesions  GU Chronic indwelling foely    Basic Metabolic Panel:  Recent Labs Lab 05/14/15 0727 05/15/15 1611 05/16/15 0420 05/17/15 0406  NA 137 139 143 145  K 4.1 4.3 3.6 3.8  CL 110 109 112* 110  CO2 19* 18* 21* 24  GLUCOSE 200* 556* 316* 167*  BUN 57* 82* 82* 78*  CREATININE 1.75* 2.18* 1.88* 1.81*  CALCIUM 8.0* 8.2* 8.2* 8.7*    Liver Function Tests:  Recent Labs Lab 05/15/15 1611  AST 105*  ALT 62*  ALKPHOS 32*  BILITOT 0.2*  PROT 7.5  ALBUMIN 2.7*   No results for input(s): LIPASE, AMYLASE in the last 168 hours. No results for input(s): AMMONIA in the last 168  hours.  CBC:  Recent Labs Lab 05/15/15 1611 05/16/15 0420 05/16/15 1803 05/17/15 0406 05/18/15 0617  WBC 15.0* 12.9*  --  12.6* 10.1  NEUTROABS 12.9*  --   --   --   --   HGB 8.2* 7.3* 9.4* 9.6* 9.1*  HCT 26.8* 22.9* 29.4* 30.6* 27.9*  MCV 84.7 84.0  --  84.2 83.8  PLT 620* 487*  --  554* 468*    Cardiac Enzymes:  Recent Labs Lab 05/15/15 2046 05/15/15 2304 05/16/15 0250 05/17/15 1152 05/17/15 1734  TROPONINI 15.48* 18.60* 16.31* 3.95* 4.84*    BNP: Invalid input(s): POCBNP  CBG:  Recent Labs Lab 05/18/15 1158 05/18/15 1645 05/18/15 2255 05/19/15 0752 05/19/15 1150  GLUCAP 185* 201* 217* 59* 68*    Microbiology: Results for orders placed or performed during the hospital encounter of 05/15/15  Blood culture (routine x 2)     Status: None (Preliminary result)   Collection Time: 05/15/15  4:11 PM  Result Value Ref Range Status   Specimen Description BLOOD RIGHT WRIST  Final   Special Requests   Final    BOTTLES DRAWN AEROBIC AND ANAEROBIC 1CC AEROBIC,1CCANA   Culture NO GROWTH 4 DAYS  Final   Report Status PENDING  Incomplete  Blood culture (routine x 2)     Status: None (Preliminary result)   Collection Time: 05/15/15  4:20 PM  Result Value Ref  Range Status   Specimen Description BLOOD LEFT HAND  Final   Special Requests BOTTLES DRAWN AEROBIC AND ANAEROBIC 5CC  Final   Culture NO GROWTH 4 DAYS  Final   Report Status PENDING  Incomplete  MRSA PCR Screening     Status: Abnormal   Collection Time: 05/15/15  8:57 PM  Result Value Ref Range Status   MRSA by PCR POSITIVE (A) NEGATIVE Final    Comment: READ BACK AND VERIFIED BY CASSIE STEWART @0028  05/16/15.Marland KitchenMarland KitchenAJO        The GeneXpert MRSA Assay (FDA approved for NASAL specimens only), is one component of a comprehensive MRSA colonization surveillance program. It is not intended to diagnose MRSA infection nor to guide or monitor treatment for MRSA infections.     Coagulation Studies: No results  for input(s): LABPROT, INR in the last 72 hours.  Urinalysis:  Recent Labs  05/17/15 1052  COLORURINE YELLOW*  LABSPEC 1.012  PHURINE 5.0  GLUCOSEU 50*  HGBUR 2+*  BILIRUBINUR NEGATIVE  KETONESUR NEGATIVE  PROTEINUR 100*  NITRITE NEGATIVE  LEUKOCYTESUR 3+*      Imaging: US Renal  05/17/2015  CLINICAL DATA:  Stage IV chronic renal disease EXAM: RENAL ULTRASOUND COMPARISON:  CT abdomen and pelvis November 21, 2014 FINDINGS: Right Kidney: Length: 11.5 cm. Echogenicity within normal limits. There is renal cortical thinning. No mass, perinephric fluid, or hydronephrosis visualized. There is focal scarring along the periphery of the lower pole of the right kidney. There is no sonographically demonstrable calculus or ureterectasis. Left Kidney: Length: 8.3 cm. Echogenicity within normal limits. There is renal cortical thinning. No mass, perinephric fluid, or hydronephrosis visualized. No sonographically demonstrable calculus or ureterectasis. Bladder: Urinary bladder is decompressed and cannot be assessed at this time. IMPRESSION: Bilateral renal cortical thinning, a finding that may be seen with medical renal disease. The renal echogenicity is felt to be within normal limits. No obstructing focus identified on either side. There is scarring in the lower pole right kidney region. There is size discrepancy between the kidneys with left kidney smaller than the right. This finding potentially may be indicative of renal artery stenosis on the left. In this regard, question whether patient is hypertensive. Electronically Signed   By: Lowella Grip III M.D.   On: 05/17/2015 17:17     Medications:     . amLODipine  5 mg Oral Daily  . antiseptic oral rinse  7 mL Mouth Rinse BID  . aspirin EC  81 mg Oral Daily  . atenolol  50 mg Oral Daily  . benazepril  40 mg Oral Daily  . cefTRIAXone (ROCEPHIN)  IV  1 g Intravenous Q24H  . Chlorhexidine Gluconate Cloth  6 each Topical Q0600  . cloNIDine  0.1 mg  Oral BID  . furosemide  20 mg Intravenous Q8H  . insulin aspart  0-5 Units Subcutaneous QHS  . insulin aspart  0-9 Units Subcutaneous TID WC  . insulin detemir  14 Units Subcutaneous QHS  . ketoconazole  1 application Topical BID  . magnesium oxide  400 mg Oral Daily  . megestrol  200 mg Oral Daily  . mirtazapine  15 mg Oral QHS  . mupirocin ointment  1 application Nasal BID  . pantoprazole  40 mg Oral QAC breakfast  . pravastatin  20 mg Oral q1800  . sodium chloride  3 mL Intravenous Q12H  . tamsulosin  0.4 mg Oral BID   oxyCODONE, promethazine  Assessment/ Plan:  [name] is a  79 y.o. white female SNF resident with peripheral vascular disease, hypertension, diabetes mellitus type II Insulin dependent, anemia, hyperlipidemia, history of uterine cancer and bladder cancer with chronic foley catheter, who was admitted to [facility] on 05/15/2015  1. Acute renal failure on chronic kidney disease stage III: baseline creatinine range of 1.5-1.8. Presentation of creatinine of 2.88 Acute renal failure from acute coronary syndrome, acute cardiorenal syndrome from ischemic cardiomyopathy, and IV contrast exposure Chronic kidney disease secondary to vascular disease, hypertension and diabetes -  urinalysis this admission shows LE 3+, protein, TNTC RBCs, TNTC WBCs - Continued on benazepril for renal protection   - Renal ultrasound report noted - No acute indication for dialysis, renally dose medications  2. Hypertension: well controlled. Now with acute exacerbation of congestive heart failure. Echocardiogram with diastolic dysfunction .  - IV furosemide - amlodipine, atenolol  3. Diabetes Mellitus type II Insulin dependent with chronic kidney disease:  hemoglobin A1c 7.4%  - Continue insulin regimen and glucose control.  4. Anemia: hemoglobin 9.1 -status post 1 unit PRBC transfusion on 12/8 With chronic kidney disease, acute coronary syndrome.    LOS:  4 Tionne Dayhoff 12/11/201612:15 PM

## 2015-05-19 NOTE — Progress Notes (Signed)
Patient alert and oriented x1, no complaints at this time. vss at this time. Patient NSR on telemetry. Will continue to assess. Shelley Fields R [place]    

## 2015-05-19 NOTE — Consult Note (Signed)
[facility] Vascular Consult Note  MRN : [id number]  Shelley Fields is a 79 y.o. (August 28, 1935) female who presents with chief complaint of  Chief Complaint  Patient presents with  . Shortness of Breath  .  History of Present Illness: Patient readmitted to the hospital last week with shortness of breath. Found to have myocardial infarction. Tolerating medical management well. Renal insufficiency above her baseline chronic kidney disease. This is improved slightly. Nephrology is following. She is about one week status post right lower extremity angiogram and percutaneous revascularization by my partner. This was done for gangrene of the right forefoot which she continues to have and is stable. She does not have fever or chills. She does not have ascending erythema up the leg. She actually reports that her pain is pretty mild in that foot and is better after her angiogram. She had long-standing skin changes for weeks and months prior to her presentation to my partner.  Current Facility-Administered Medications  Medication Dose Route Frequency Provider Last Rate Last Dose  . amLODipine (NORVASC) tablet 5 mg  5 mg Oral Daily Vaughan Basta, MD   5 mg at 05/19/15 [id number]  . antiseptic oral rinse (CPC / CETYLPYRIDINIUM CHLORIDE 0.05%) solution 7 mL  7 mL Mouth Rinse BID Nicholes Mango, MD   7 mL at 05/19/15 1000  . aspirin EC tablet 81 mg  81 mg Oral Daily Vaughan Basta, MD   81 mg at 05/19/15 [id number]  . atenolol (TENORMIN) tablet 50 mg  50 mg Oral Daily Vaughan Basta, MD   50 mg at 05/19/15 0831  . benazepril (LOTENSIN) tablet 40 mg  40 mg Oral Daily Lavonia Dana, MD   40 mg at 05/19/15 [id number]  . cefTRIAXone (ROCEPHIN) 1 g in dextrose 5 % 50 mL IVPB  1 g Intravenous Q24H Vaughan Basta, MD      . Chlorhexidine Gluconate Cloth 2 % PADS 6 each  6 each Topical Q0600 Vaughan Basta, MD   6 each at 05/19/15 0604  . cloNIDine (CATAPRES) tablet 0.1 mg  0.1  mg Oral BID Nicholes Mango, MD   0.1 mg at 05/19/15 0832  . furosemide (LASIX) injection 20 mg  20 mg Intravenous Q8H Vaughan Basta, MD   20 mg at 05/19/15 0604  . insulin aspart (novoLOG) injection 0-5 Units  0-5 Units Subcutaneous QHS Nicholes Mango, MD   2 Units at 05/18/15 2302  . insulin aspart (novoLOG) injection 0-9 Units  0-9 Units Subcutaneous TID WC Nicholes Mango, MD   5 Units at 05/19/15 0831  . insulin detemir (LEVEMIR) injection 14 Units  14 Units Subcutaneous QHS Nicholes Mango, MD   14 Units at 05/18/15 2300  . ketoconazole (NIZORAL) 2 % cream 1 application  1 application Topical BID Vaughan Basta, MD   1 application at [id number] [phone]  . magnesium oxide (MAG-OX) tablet 400 mg  400 mg Oral Daily Vaughan Basta, MD   400 mg at 05/19/15 [id number]  . megestrol (MEGACE) 400 MG/10ML suspension 200 mg  200 mg Oral Daily Nicholes Mango, MD   200 mg at 05/19/15 0831  . mirtazapine (REMERON) tablet 15 mg  15 mg Oral QHS Vaughan Basta, MD   15 mg at 05/18/15 2259  . mupirocin ointment (BACTROBAN) 2 % 1 application  1 application Nasal BID Vaughan Basta, MD   1 application at [id number]  . oxyCODONE (Oxy IR/ROXICODONE) immediate release tablet 15 mg  15 mg Oral Q4H PRN Vaughan Basta, MD  15 mg at 05/19/15 [id number]  . pantoprazole (PROTONIX) EC tablet 40 mg  40 mg Oral QAC breakfast Vaughan Basta, MD   40 mg at 05/19/15 0831  . pravastatin (PRAVACHOL) tablet 20 mg  20 mg Oral q1800 Vaughan Basta, MD   20 mg at 05/18/15 1702  . promethazine (PHENERGAN) tablet 25 mg  25 mg Oral Q8H PRN Vaughan Basta, MD      . sodium chloride 0.9 % injection 3 mL  3 mL Intravenous Q12H Vaughan Basta, MD   3 mL at 05/19/15 1000  . tamsulosin (FLOMAX) capsule 0.4 mg  0.4 mg Oral BID Vaughan Basta, MD   0.4 mg at 05/19/15 [id number]   Facility-Administered Medications Ordered in Other Encounters  Medication Dose Route Frequency Provider Last Rate Last Dose   . sodium chloride 0.9 % injection 10 mL  10 mL Intravenous PRN Evlyn Kanner, NP   10 mL at 11/02/14 [id number]    Past Medical History  Diagnosis Date  . Diabetes mellitus without complication ([place])   . C. difficile diarrhea   . Uterine cancer ([place])   . Hypogammaglobulinemia ([place])   . Pulmonary embolism ([place])   . Decubitus ulcers   . Hypertension   . Hypercholesterolemia   . Incontinence of urine   . Cancer [facility])     Endometrial Cancer  . Cervical cancer ([place])     with radiation 45 years ago...  . Arthritis   . GERD (gastroesophageal reflux disease)   . Bladder cancer ([place])   . Decubitus ulcer of buttock, stage 1   . Peripheral vascular disease ([place])   . Anemia     Past Surgical History  Procedure Laterality Date  . Total abdominal hysterectomy w/ bilateral salpingoophorectomy  07/29/2010  . Stent in leg  2016  . Cataract extraction, bilateral    . Bladder surgery    . Femoral endarterectomy      right  . Leg surgery      right leg  . Portacath placement      right  . Esophagogastroduodenoscopy N/A 12/14/2014    Procedure: ESOPHAGOGASTRODUODENOSCOPY (EGD);  Surgeon: Hulen Luster, MD;  Location: [facility] ENDOSCOPY;  Service: Endoscopy;  Laterality: N/A;  . Peripheral vascular catheterization Right 04/05/2015    Procedure: Lower Extremity Angiography;  Surgeon: Katha Cabal, MD;  Location: [place] CV LAB;  Service: Cardiovascular;  Laterality: Right;  . Peripheral vascular catheterization  04/05/2015    Procedure: Lower Extremity Intervention;  Surgeon: Katha Cabal, MD;  Location: [place] [facility] LAB;  Service: Cardiovascular;;  . Peripheral vascular catheterization Right 05/14/2015    Procedure: Lower Extremity Angiography;  Surgeon: Katha Cabal, MD;  Location: [place] [facility] LAB;  Service: Cardiovascular;  Laterality: Right;  . Peripheral vascular catheterization Right 05/14/2015    Procedure: Lower Extremity Intervention;  Surgeon: Katha Cabal, MD;   Location: [place] CV LAB;  Service: Cardiovascular;  Laterality: Right;  . Angioplasty      Social History Social History  Substance Use Topics  . Smoking status: Never Smoker   . Smokeless tobacco: None  . Alcohol Use: No   no IV drug use  Family History Family History  Problem Relation Age of Onset  . Hypertension Mother   . Diabetes Mellitus II Mother   . Coronary artery disease Mother   . Hypertension Father   . Diabetes Mellitus II Father   . CAD Father     Allergies  Allergen Reactions  .  Contrast Media [Iodinated Diagnostic Agents]      REVIEW OF SYSTEMS (Negative unless checked)  Constitutional: [] Weight loss  [] Fever  [] Chills Cardiac: [x] Chest pain   [] Chest pressure   [] Palpitations   [] Shortness of breath when laying flat   [] Shortness of breath at rest   [x] Shortness of breath with exertion. Vascular:  [] Pain in legs with walking   [] Pain in legs at rest   [] Pain in legs when laying flat   [] Claudication   [] Pain in feet when walking  [] Pain in feet at rest  [] Pain in feet when laying flat   [] History of DVT   [] Phlebitis   [] Swelling in legs   [] Varicose veins   [x] Non-healing ulcers Pulmonary:   [] Uses home oxygen   [] Productive cough   [] Hemoptysis   [] Wheeze  [] COPD   [] Asthma Neurologic:  [] Dizziness  [] Blackouts   [] Seizures   [] History of stroke   [] History of TIA  [] Aphasia   [] Temporary blindness   [] Dysphagia   [] Weakness or numbness in arms   [] Weakness or numbness in legs Musculoskeletal:  [] Arthritis   [] Joint swelling   [] Joint pain   [] Low back pain Hematologic:  [] Easy bruising  [] Easy bleeding   [] Hypercoagulable state   [] Anemic  [] Hepatitis Gastrointestinal:  [] Blood in stool   [] Vomiting blood  [] Gastroesophageal reflux/heartburn   [] Difficulty swallowing. Genitourinary:  [x] Chronic kidney disease   [] Difficult urination  [] Frequent urination  [] Burning with urination   [] Blood in urine Skin:  [] Rashes   [x] Ulcers    [x] Wounds Psychological:  [] History of anxiety   []  History of major depression.  Physical Examination  Filed Vitals:   05/18/15 1950 05/19/15 0550 05/19/15 0830 05/19/15 0831  BP: 119/85 137/92 127/85   Pulse: 58 56 60 69  Temp:  97.8 F (36.6 C)    TempSrc:  Oral    Resp:  20    Height:      Weight:      SpO2:  99%     Body mass index is 29.82 kg/(m^2). Gen:  WD/WN, NAD Head: [place]/AT, No temporalis wasting. Prominent temp pulse not noted. Ear/Nose/Throat: Hearing grossly intact, nares w/o erythema or drainage, oropharynx w/o Erythema/Exudate Eyes: PERRLA, EOMI.  Neck: Supple, no nuchal rigidity.  No JVD.  Pulmonary:  Good air movement, no use of accessory muscles.  Cardiac: RRR, normal S1, S2,  Vascular:  Vessel Right Left  Radial Palpable Palpable  Ulnar Palpable Palpable  Brachial Palpable Palpable  Carotid Palpable, without bruit Palpable, without bruit  Aorta Not palpable N/A  Femoral Palpable Palpable  Popliteal Palpable Palpable  PT  not Palpable Palpable  DP  not Palpable  not Palpable   Gastrointestinal: soft, non-tender/non-distended. No guarding/reflex. No masses, surgical incisions, or scars. Musculoskeletal: Diffusely weak  No deformity or atrophy. 1-2+ right lower extremity edema. Gangrenous changes of the right foot as below Neurologic: CN 2-12 intact. Pain and light touch intact in extremities.  Symmetrical.  Speech is fluent. Motor exam as listed above. Psychiatric: Judgment intact, Mood & affect appropriate for pt's clinical situation. Dermatologic: Right forefoot with dry gangrene and marginal tissue in the mid foot area. Mild surrounding erythema. No ascending erythema above the ankle into the leg. Mild drainage Lymph : No Cervical, Axillary, or Inguinal lymphadenopathy.    CBC Lab Results  Component Value Date   WBC 10.1 05/18/2015   HGB 9.1* 05/18/2015   HCT 27.9* 05/18/2015   MCV 83.8 05/18/2015   PLT 468* 05/18/2015    BMET  Component  Value Date/Time   NA 145 05/17/2015 0406   NA 136 07/13/2014 1022   K 3.8 05/17/2015 0406   K 4.8 07/13/2014 1022   CL 110 05/17/2015 0406   CL 101 07/13/2014 1022   CO2 24 05/17/2015 0406   CO2 26 07/13/2014 1022   GLUCOSE 167* 05/17/2015 0406   GLUCOSE 218* 07/13/2014 1022   BUN 78* 05/17/2015 0406   BUN 27* 09/04/2014 1113   CREATININE 1.81* 05/17/2015 0406   CREATININE 1.20* 10/03/2014 1030   CALCIUM 8.7* 05/17/2015 0406   CALCIUM 8.2* 07/13/2014 1022   GFRNONAA 26* 05/17/2015 0406   GFRNONAA 44* 10/03/2014 1030   GFRNONAA 31* 07/13/2014 1022   GFRAA 30* 05/17/2015 0406   GFRAA 50* 10/03/2014 1030   GFRAA 37* 07/13/2014 1022   Estimated Creatinine Clearance: 22.6 mL/min (by C-G formula based on Cr of 1.81).  COAG Lab Results  Component Value Date   INR 1.62 05/15/2015   INR 1.75 05/14/2015   INR 2.53 04/29/2015    Radiology Dg Chest 2 View  05/16/2015  CLINICAL DATA:  Pulmonary edema. EXAM: CHEST  2 VIEW COMPARISON:  05/15/2015 FINDINGS: Improved pulmonary edema. Small bilateral pleural effusion, greater on the left. Chronic cardiomegaly. Porta catheter from the right, tip still at the SVC level. Calcified peritracheal nodes on the right. There is a 21 mm nodule over the right perihilar lung which appears increased from July 2016, suspected metastatic disease in this patient with metastatic retroperitoneal adenopathy. IMPRESSION: 1. Improved pulmonary edema.  Stable small pleural effusions. 2. 2 cm right perihilar nodule which appears increased from July 2016. Electronically Signed   By: Monte Fantasia M.D.   On: 05/16/2015 13:42   US Renal  05/17/2015  CLINICAL DATA:  Stage IV chronic renal disease EXAM: RENAL ULTRASOUND COMPARISON:  CT abdomen and pelvis November 21, 2014 FINDINGS: Right Kidney: Length: 11.5 cm. Echogenicity within normal limits. There is renal cortical thinning. No mass, perinephric fluid, or hydronephrosis visualized. There is focal scarring along the  periphery of the lower pole of the right kidney. There is no sonographically demonstrable calculus or ureterectasis. Left Kidney: Length: 8.3 cm. Echogenicity within normal limits. There is renal cortical thinning. No mass, perinephric fluid, or hydronephrosis visualized. No sonographically demonstrable calculus or ureterectasis. Bladder: Urinary bladder is decompressed and cannot be assessed at this time. IMPRESSION: Bilateral renal cortical thinning, a finding that may be seen with medical renal disease. The renal echogenicity is felt to be within normal limits. No obstructing focus identified on either side. There is scarring in the lower pole right kidney region. There is size discrepancy between the kidneys with left kidney smaller than the right. This finding potentially may be indicative of renal artery stenosis on the left. In this regard, question whether patient is hypertensive. Electronically Signed   By: Lowella Grip III M.D.   On: 05/17/2015 17:17   Nm Pulmonary Perf And Vent  05/16/2015  CLINICAL DATA:  Pulmonary edema. EXAM: NUCLEAR MEDICINE VENTILATION - PERFUSION LUNG SCAN TECHNIQUE: Ventilation images were obtained in multiple projections using inhaled aerosol Tc-33m DTPA. Perfusion images were obtained in multiple projections after intravenous injection of Tc-31m MAA. RADIOPHARMACEUTICALS:  32.7 Technetium-30m DTPA aerosol inhalation. No technetium 99 m MAA administered. COMPARISON:  Chest x-ray 05/26/2015. FINDINGS: No ventilation perfusion images obtained as patient would only allow ventilation for approximately 1 minute. The patient refused remainder of the exam. IMPRESSION: Patient refused exam just after ventilation.  No images obtained. Electronically Signed  By: [place]   On: 05/16/2015 14:54   Dg Chest Port 1 View  05/15/2015  CLINICAL DATA:  Confusion. Difficulty breathing. History of endometrial carcinoma. EXAM: PORTABLE CHEST 1 VIEW COMPARISON:  December 12, 2014  FINDINGS: There is airspace consolidation throughout much of the right mid and lower lung zones. A lesser degree of infiltrate is noted in the left base. There is also underlying interstitial edema. Heart is slightly enlarged with pulmonary venous hypertension. No adenopathy. Port-A-Cath tip is in the superior vena cava. No pneumothorax. IMPRESSION: Airspace consolidation in both lung bases as well as in the right mid lung. Underlying congestive heart failure. The airspace opacity may represent alveolar edema, although the appearance is concerning for superimposed pneumonia. Note that a nodular opacity in the right mid lung noted previously is obscured by infiltrate currently. Electronically Signed   By: Lowella Grip III M.D.   On: 05/15/2015 16:15      Assessment/Plan 1. Gangrene of right forefoot. Patient will be marginal for a transmetatarsal amputation and will allow the tissue to demarcate to determine the level of amputation. Her pain is not intolerable. There is not ascending infection. No need for immediate amputation, although ultimately she will require either a extensive transmetatarsal amputation or possibly a more proximal amputation below or above the knee depending on how the tissue demarcates. 2. Peripheral arterial disease. Status post angiogram with revascularization less than a week ago. Foot is warm to the ankle. I do not believe that a repeat angiogram would be of much benefit currently, but it felt strongly by the primary service that is something we could do this week. 3. Diabetes. Tight glucose control will improve chances of wound healing and also lower progression of atherosclerotic peripheral arterial disease. 4. Chronic kidney disease with acute renal insufficiency. Slowly improving. Nephrology is following. 5. Recent coronary event. Also would be beneficial to delay her surgery for a little while longer for cardiac reasons if possible.   Aryanah Enslow,  MD  05/19/2015 9:54 AM

## 2015-05-19 NOTE — Progress Notes (Signed)
[facility] [place] NAME: Shelley Fields    MR#:  [id number]  DATE OF BIRTH:  [birth year]  SUBJECTIVE:  CHIEF COMPLAINT:  Patient is resting comfortably. Right leg is turning black. No pain. Off heparin drip.  REVIEW OF SYSTEMS:  CONSTITUTIONAL: No fever, fatigue or weakness.  EYES: No blurred or double vision.  EARS, NOSE, AND THROAT: No tinnitus or ear pain.  RESPIRATORY: No cough, shortness of breath, wheezing or hemoptysis.  CARDIOVASCULAR: No chest pain, orthopnea, edema.  GASTROINTESTINAL: No nausea, vomiting, diarrhea or abdominal pain.  GENITOURINARY: No dysuria, hematuria.  ENDOCRINE: No polyuria, nocturia,  HEMATOLOGY: No anemia, easy bruising or bleeding SKIN: No rash or lesion. MUSCULOSKELETAL: No joint pain or arthritis. Has chronic peripheral vascular disease and had angioplasty for right foot gangrene recently  NEUROLOGIC: No tingling, numbness, weakness.  PSYCHIATRY: No anxiety or depression.   DRUG ALLERGIES:   Allergies  Allergen Reactions  . Contrast Media [Iodinated Diagnostic Agents]     VITALS:  Blood pressure 102/55, pulse 54, temperature 97.6 F (36.4 C), temperature source Oral, resp. rate 18, height 5' (1.524 m), weight 69.264 kg (152 lb 11.2 oz), SpO2 89 %.  PHYSICAL EXAMINATION:  GENERAL:  79 y.o.-year-old patient lying in the bed with no acute distress.  EYES: Pupils equal, round, reactive to light and accommodation. No scleral icterus. Extraocular muscles intact.  HEENT: Head atraumatic, normocephalic. Oropharynx and nasopharynx clear.  NECK:  Supple, no jugular venous distention. No thyroid enlargement, no tenderness.  LUNGS: Normal breath sounds bilaterally, no wheezing, rales,rhonchi or crepitation. No use of accessory muscles of respiration.  CARDIOVASCULAR: S1, S2 normal. No murmurs, rubs, or gallops.  ABDOMEN: Soft, nontender, nondistended. Bowel sounds present. No organomegaly or mass.   EXTREMITIES: No pedal edema, . Diminished peripheral pulses. Right distal foot is turning black on toes, and not able to palpate pulse on right dorsalis pedis artery. NEUROLOGIC: Cranial nerves II through XII are intact. Muscle strength 5/5 in all extremities. Sensation intact. Gait not checked.  PSYCHIATRIC: The patient is alert and oriented x 3.  SKIN: No obvious rash, lesion, or ulcer.    LABORATORY PANEL:   CBC  Recent Labs Lab 05/18/15 0617  WBC 10.1  HGB 9.1*  HCT 27.9*  PLT 468*   ------------------------------------------------------------------------------------------------------------------  Chemistries   Recent Labs Lab 05/15/15 1611  05/17/15 0406  NA 139  < > 145  K 4.3  < > 3.8  CL 109  < > 110  CO2 18*  < > 24  GLUCOSE 556*  < > 167*  BUN 82*  < > 78*  CREATININE 2.18*  < > 1.81*  CALCIUM 8.2*  < > 8.7*  AST 105*  --   --   ALT 62*  --   --   ALKPHOS 32*  --   --   BILITOT 0.2*  --   --   < > = values in this interval not displayed. ------------------------------------------------------------------------------------------------------------------  Cardiac Enzymes  Recent Labs Lab 05/17/15 1734  TROPONINI 4.84*   ------------------------------------------------------------------------------------------------------------------  RADIOLOGY:  US Renal  05/17/2015  CLINICAL DATA:  Stage IV chronic renal disease EXAM: RENAL ULTRASOUND COMPARISON:  CT abdomen and pelvis November 21, 2014 FINDINGS: Right Kidney: Length: 11.5 cm. Echogenicity within normal limits. There is renal cortical thinning. No mass, perinephric fluid, or hydronephrosis visualized. There is focal scarring along the periphery of the lower pole of the right kidney. There is no sonographically demonstrable calculus or  ureterectasis. Left Kidney: Length: 8.3 cm. Echogenicity within normal limits. There is renal cortical thinning. No mass, perinephric fluid, or hydronephrosis visualized. No  sonographically demonstrable calculus or ureterectasis. Bladder: Urinary bladder is decompressed and cannot be assessed at this time. IMPRESSION: Bilateral renal cortical thinning, a finding that may be seen with medical renal disease. The renal echogenicity is felt to be within normal limits. No obstructing focus identified on either side. There is scarring in the lower pole right kidney region. There is size discrepancy between the kidneys with left kidney smaller than the right. This finding potentially may be indicative of renal artery stenosis on the left. In this regard, question whether patient is hypertensive. Electronically Signed   By: Lowella Grip III M.D.   On: 05/17/2015 17:17    EKG:   Orders placed or performed during the hospital encounter of 05/15/15  . ED EKG  . ED EKG    ASSESSMENT AND PLAN:   * Non-ST elevation MI Patient is currently asymptomatic , continue Heparin IV drip for 48-72 hours Cardiology is defering   Cardiaccath eterization  Continue Aspirin, statin  lipid panel,  monitor on telemetry Serial troponins 18.6-16.31 Reviewed echo. Appreciate cardiologist recommendations  * Acute on chronic Congestive heart failure EF 55%- so it was diastolic Heart failure.  Continued IV Lasix - switched to oral now. Intake and output measurement  close monitoring of renal function is she has worsening renal function also.  *Anemia with hemoglobin 7.3 in the setting of non-STEMI Status post  1 unit of blood transfusion and repeat hemoglobin at 9.1  * Acute on chronic renal failure Baseline creatinine was around 1.3 for 5 month ago, now it is more than 2.18-->1.8 Patient is on IV Lasix for acute on chronic CHF,  monitor renal function,: Nephrology consult is appreciated. Hold ACE inhibitor and try to maintain the blood pressure.  * History of pulmonary embolism This is as per the previous records but patient is not aware about any kind of blood clots,  though she was taking Coumadin at home. Her INR was subtherapeutic , held Coumadin for now  Patient was on heparin drip for non-STEMI - now stopped.   Will wait for vascular- if any plans for surgery or not.  * Hyperglycemia with diabetes Continue 10 units of Levemir - increased due to hyperglycemia.  on sliding scale coverage.  * Hypertension She is on multiple antihypertensive medications but her blood pressure is running between [id number] to [id number] systolic range, Continue amlodipine, atenolol and Lasix IV  * gangene on foot- PVD   S/p vascular procedure last week.   Called vascular consult.   As per him wait for clear demarcation.    All the records are reviewed and case discussed with Care Management/Social Workerr. Management plans discussed with the patient, family and they are in agreement.  CODE STATUS: DO NOT RESUSCITATE  TOTAL  TIME TAKING CARE OF THIS PATIENT: 35 minutes.  Spoke to her daughter Ms Harrington Challenger on phone- informed her about the findings and plan.  POSSIBLE D/C IN 1-2 DAYS, DEPENDING ON CLINICAL CONDITION. If no plans by vascular for surgery- may d/c to NH soon.  Vaughan Basta M.D on 05/19/2015 at 3:48 PM  Between 7am to 6pm - Pager - [phone] After 6pm go to [email or web address] - password [email or web address] [facility]  Office  [phone]  CC: Primary care physician; [name], MD

## 2015-05-19 NOTE — Progress Notes (Signed)
[facility] [place] NAME: Shelley Fields    MR#:  [id number]  DATE OF BIRTH:  May 26, 1936  SUBJECTIVE:  CHIEF COMPLAINT:  Patient is resting comfortably. Right leg is turning black. No pain. Off heparin drip.  REVIEW OF SYSTEMS:  CONSTITUTIONAL: No fever, fatigue or weakness.  EYES: No blurred or double vision.  EARS, NOSE, AND THROAT: No tinnitus or ear pain.  RESPIRATORY: No cough, shortness of breath, wheezing or hemoptysis.  CARDIOVASCULAR: No chest pain, orthopnea, edema.  GASTROINTESTINAL: No nausea, vomiting, diarrhea or abdominal pain.  GENITOURINARY: No dysuria, hematuria.  ENDOCRINE: No polyuria, nocturia,  HEMATOLOGY: No anemia, easy bruising or bleeding SKIN: No rash or lesion. MUSCULOSKELETAL: No joint pain or arthritis. Has chronic peripheral vascular disease and had angioplasty for right foot gangrene recently  NEUROLOGIC: No tingling, numbness, weakness.  PSYCHIATRY: No anxiety or depression.   DRUG ALLERGIES:   Allergies  Allergen Reactions  . Contrast Media [Iodinated Diagnostic Agents]     VITALS:  Blood pressure 102/55, pulse 54, temperature 97.6 F (36.4 C), temperature source Oral, resp. rate 18, height 5' (1.524 m), weight 69.264 kg (152 lb 11.2 oz), SpO2 89 %.  PHYSICAL EXAMINATION:  GENERAL:  79 y.o.-year-old patient lying in the bed with no acute distress.  EYES: Pupils equal, round, reactive to light and accommodation. No scleral icterus. Extraocular muscles intact.  HEENT: Head atraumatic, normocephalic. Oropharynx and nasopharynx clear.  NECK:  Supple, no jugular venous distention. No thyroid enlargement, no tenderness.  LUNGS: Normal breath sounds bilaterally, no wheezing, rales,rhonchi or crepitation. No use of accessory muscles of respiration.  CARDIOVASCULAR: S1, S2 normal. No murmurs, rubs, or gallops.  ABDOMEN: Soft, nontender, nondistended. Bowel sounds present. No organomegaly or mass.   EXTREMITIES: No pedal edema, . Diminished peripheral pulses. Right leg is turning black on toes, and not able to palpate pulse on right dorsalis pedis artery. NEUROLOGIC: Cranial nerves II through XII are intact. Muscle strength 5/5 in all extremities. Sensation intact. Gait not checked.  PSYCHIATRIC: The patient is alert and oriented x 3.  SKIN: No obvious rash, lesion, or ulcer.    LABORATORY PANEL:   CBC  Recent Labs Lab 05/18/15 0617  WBC 10.1  HGB 9.1*  HCT 27.9*  PLT 468*   ------------------------------------------------------------------------------------------------------------------  Chemistries   Recent Labs Lab 05/15/15 1611  05/17/15 0406  NA 139  < > 145  K 4.3  < > 3.8  CL 109  < > 110  CO2 18*  < > 24  GLUCOSE 556*  < > 167*  BUN 82*  < > 78*  CREATININE 2.18*  < > 1.81*  CALCIUM 8.2*  < > 8.7*  AST 105*  --   --   ALT 62*  --   --   ALKPHOS 32*  --   --   BILITOT 0.2*  --   --   < > = values in this interval not displayed. ------------------------------------------------------------------------------------------------------------------  Cardiac Enzymes  Recent Labs Lab 05/17/15 1734  TROPONINI 4.84*   ------------------------------------------------------------------------------------------------------------------  RADIOLOGY:  US Renal  05/17/2015  CLINICAL DATA:  Stage IV chronic renal disease EXAM: RENAL ULTRASOUND COMPARISON:  CT abdomen and pelvis November 21, 2014 FINDINGS: Right Kidney: Length: 11.5 cm. Echogenicity within normal limits. There is renal cortical thinning. No mass, perinephric fluid, or hydronephrosis visualized. There is focal scarring along the periphery of the lower pole of the right kidney. There is no sonographically demonstrable calculus or ureterectasis.  Left Kidney: Length: 8.3 cm. Echogenicity within normal limits. There is renal cortical thinning. No mass, perinephric fluid, or hydronephrosis visualized. No  sonographically demonstrable calculus or ureterectasis. Bladder: Urinary bladder is decompressed and cannot be assessed at this time. IMPRESSION: Bilateral renal cortical thinning, a finding that may be seen with medical renal disease. The renal echogenicity is felt to be within normal limits. No obstructing focus identified on either side. There is scarring in the lower pole right kidney region. There is size discrepancy between the kidneys with left kidney smaller than the right. This finding potentially may be indicative of renal artery stenosis on the left. In this regard, question whether patient is hypertensive. Electronically Signed   By: Lowella Grip III M.D.   On: 05/17/2015 17:17    EKG:   Orders placed or performed during the hospital encounter of 05/15/15  . ED EKG  . ED EKG    ASSESSMENT AND PLAN:   * Non-ST elevation MI Patient is currently asymptomatic , continue Heparin IV drip for 48-72 hours Cardiology is defering   Cardiaccath eterization  Continue Aspirin, statin  lipid panel,  monitor on telemetry Serial troponins 18.6-16.31 Pending echocardiogram  Appreciate cardiologist recommendations  * Acute on chronic Congestive heart failure EF 55%- so it was diastolic Heart failure.  Continue IV Lasix  Intake and output measurement  close monitoring of renal function is she has worsening renal function also.  *Anemia with hemoglobin 7.3 in the setting of non-STEMI Status post  1 unit of blood transfusion and repeat hemoglobin at 9.1  * Acute on chronic renal failure Baseline creatinine was around 1.3 for 5 month ago, now it is more than 2.18-->1.8 Patient is on IV Lasix for acute on chronic CHF,  monitor renal function,: Nephrology consult is appreciated. Hold ACE inhibitor and try to maintain the blood pressures   * History of pulmonary embolism This is as per the previous records but patient is not aware about any kind of blood clots, though she was  taking Coumadin at home. Her INR was subtherapeutic , held Coumadin for now  Patient was on heparin drip for non-STEMI - now stopped.   Will wait for vascular- if any plans for surgery or not.  * Hyperglycemia with diabetes Continue 10 units of Levemir   on sliding scale coverage.  * Hypertension She is on multiple antihypertensive medications but her blood pressure is running between [id number] to [id number] systolic range, Continue amlodipine, atenolol and Lasix IV  * gangene on foot- PVD   S/p vascular procedure last week.   Called vascular consult.    All the records are reviewed and case discussed with Care Management/Social Workerr. Management plans discussed with the patient, family and they are in agreement.  CODE STATUS: DO NOT RESUSCITATE  TOTAL CRITICAL CARE TIME TAKING CARE OF THIS PATIENT: 35 minutes.   POSSIBLE D/C IN 1-2 DAYS, DEPENDING ON CLINICAL CONDITION.   Vaughan Basta M.D on 05/19/2015 at 3:31 PM  Between 7am to 6pm - Pager - [phone] After 6pm go to [email or web address] - password [email or web address] [facility]  Office  [phone]  CC: Primary care physician; [name], MD

## 2015-05-19 NOTE — Progress Notes (Signed)
Pt. Stayed awake most of the night talking to relatives that were not there. Pt. Re-directed and would quickly regress back to speaking with absent family members. No acute distress noted. Pain medication given twice during the night for signs of pain. Pt. Did not c/o pain. Will continue to monitor pt.

## 2015-05-20 LAB — BASIC METABOLIC PANEL
ANION GAP: 7 (ref 5–15)
BUN: 78 mg/dL — ABNORMAL HIGH (ref 6–20)
CALCIUM: 8.1 mg/dL — AB (ref 8.9–10.3)
CO2: 25 mmol/L (ref 22–32)
Chloride: 110 mmol/L (ref 101–111)
Creatinine, Ser: 1.94 mg/dL — ABNORMAL HIGH (ref 0.44–1.00)
GFR, EST AFRICAN AMERICAN: 28 mL/min — AB (ref >60)
GFR, EST NON AFRICAN AMERICAN: 24 mL/min — AB (ref >60)
GLUCOSE: 131 mg/dL — AB (ref 65–99)
Potassium: 3.5 mmol/L (ref 3.5–5.1)
Sodium: 142 mmol/L (ref 135–145)

## 2015-05-20 LAB — CBC
HCT: 27.5 % — ABNORMAL LOW (ref 35.0–47.0)
Hemoglobin: 8.8 g/dL — ABNORMAL LOW (ref 12.0–16.0)
MCH: 27.6 pg (ref 26.0–34.0)
MCHC: 32.2 g/dL (ref 32.0–36.0)
MCV: 85.6 fL (ref 80.0–100.0)
PLATELETS: 436 10*3/uL (ref 150–440)
RBC: 3.21 MIL/uL — ABNORMAL LOW (ref 3.80–5.20)
RDW: 18.2 % — AB (ref 11.5–14.5)
WBC: 10.8 10*3/uL (ref 3.6–11.0)

## 2015-05-20 LAB — GLUCOSE, CAPILLARY
GLUCOSE-CAPILLARY: 289 mg/dL — AB (ref 65–99)
Glucose-Capillary: 173 mg/dL — ABNORMAL HIGH (ref 65–99)
Glucose-Capillary: 177 mg/dL — ABNORMAL HIGH (ref 65–99)
Glucose-Capillary: 175 mg/dL — ABNORMAL HIGH (ref 65–99)

## 2015-05-20 LAB — CULTURE, BLOOD (ROUTINE X 2)
CULTURE: NO GROWTH
Culture: NO GROWTH

## 2015-05-20 LAB — BUN: BUN: 73 mg/dL — ABNORMAL HIGH (ref 6–20)

## 2015-05-20 LAB — CREATININE, SERUM
Creatinine, Ser: 1.94 mg/dL — ABNORMAL HIGH (ref 0.44–1.00)
GFR calc non Af Amer: 24 mL/min — ABNORMAL LOW (ref >60)
GFR, EST AFRICAN AMERICAN: 28 mL/min — AB (ref >60)

## 2015-05-20 LAB — PROTIME-INR
INR: 1.78
Prothrombin Time: 20.7 seconds — ABNORMAL HIGH (ref 11.4–15.0)

## 2015-05-20 MED ORDER — INSULIN DETEMIR 100 UNIT/ML ~~LOC~~ SOLN
30.0000 [IU] | Freq: Every day | SUBCUTANEOUS | Status: DC
Start: 2015-05-20 — End: 2015-05-21
  Administered 2015-05-20: 30 [IU] via SUBCUTANEOUS
  Filled 2015-05-20 (×3): qty 0.3

## 2015-05-20 NOTE — Progress Notes (Signed)
[facility] [place] NAME: Shelley Fields    MR#:  [id number]  DATE OF BIRTH:  1935/11/28  SUBJECTIVE:  CHIEF COMPLAINT:  Patient is resting comfortably. Right leg is turning black. No pain. Off heparin drip. Distal half foot is completely black and she have some pain in there.  REVIEW OF SYSTEMS:  CONSTITUTIONAL: No fever, fatigue or weakness.  EYES: No blurred or double vision.  EARS, NOSE, AND THROAT: No tinnitus or ear pain.  RESPIRATORY: No cough, shortness of breath, wheezing or hemoptysis.  CARDIOVASCULAR: No chest pain, orthopnea, edema.  GASTROINTESTINAL: No nausea, vomiting, diarrhea or abdominal pain.  GENITOURINARY: No dysuria, hematuria.  ENDOCRINE: No polyuria, nocturia,  HEMATOLOGY: No anemia, easy bruising or bleeding SKIN: No rash or lesion. MUSCULOSKELETAL: No joint pain or arthritis. Has chronic peripheral vascular disease and had angioplasty for right foot gangrene recently - pain in foot and leg. NEUROLOGIC: No tingling, numbness, weakness.  PSYCHIATRY: No anxiety or depression.   DRUG ALLERGIES:   Allergies  Allergen Reactions  . Contrast Media [Iodinated Diagnostic Agents]     VITALS:  Blood pressure 111/38, pulse 64, temperature 97.9 F (36.6 C), temperature source Oral, resp. rate 22, height 5' (1.524 m), weight 69.264 kg (152 lb 11.2 oz), SpO2 97 %.  PHYSICAL EXAMINATION:  GENERAL:  79 y.o.-year-old patient lying in the bed with no acute distress.  EYES: Pupils equal, round, reactive to light and accommodation. No scleral icterus. Extraocular muscles intact.  HEENT: Head atraumatic, normocephalic. Oropharynx and nasopharynx clear.  NECK:  Supple, no jugular venous distention. No thyroid enlargement, no tenderness.  LUNGS: Normal breath sounds bilaterally, no wheezing, rales,rhonchi or crepitation. No use of accessory muscles of respiration.  CARDIOVASCULAR: S1, S2 normal. No murmurs, rubs, or gallops.   ABDOMEN: Soft, nontender, nondistended. Bowel sounds present. No organomegaly or mass.  EXTREMITIES: No pedal edema, . Diminished peripheral pulses. Right distal foot is turning black on toes, and not able to palpate pulse on right dorsalis pedis artery. NEUROLOGIC: Cranial nerves II through XII are intact. Muscle strength 5/5 in all extremities. Sensation intact. Gait not checked.  PSYCHIATRIC: The patient is alert and oriented x 3.  SKIN: No obvious rash, lesion, or ulcer.    LABORATORY PANEL:   CBC  Recent Labs Lab 05/20/15 0609  WBC 10.8  HGB 8.8*  HCT 27.5*  PLT 436   ------------------------------------------------------------------------------------------------------------------  Chemistries   Recent Labs Lab 05/15/15 1611  05/20/15 0609 05/20/15 0821  NA 139  < > 142  --   K 4.3  < > 3.5  --   CL 109  < > 110  --   CO2 18*  < > 25  --   GLUCOSE 556*  < > 131*  --   BUN 82*  < > 78* 73*  CREATININE 2.18*  < > 1.94* 1.94*  CALCIUM 8.2*  < > 8.1*  --   AST 105*  --   --   --   ALT 62*  --   --   --   ALKPHOS 32*  --   --   --   BILITOT 0.2*  --   --   --   < > = values in this interval not displayed. ------------------------------------------------------------------------------------------------------------------  Cardiac Enzymes  Recent Labs Lab 05/17/15 1734  TROPONINI 4.84*   ------------------------------------------------------------------------------------------------------------------  RADIOLOGY:  No results found.  EKG:   Orders placed or performed during the hospital encounter of 05/15/15  .  ED EKG  . ED EKG    ASSESSMENT AND PLAN:   * Non-ST elevation MI Patient is currently asymptomatic , continued Heparin IV drip for 48-72 hours and then stopped. Cardiology is defering   Cardiaccath eterization  Continue Aspirin, statin  lipid panel,  Serial troponins 18.6-16.31 Reviewed echo. Appreciate cardiologist recommendations    Discontinue telemetry.  * Acute on chronic Congestive heart failure EF 55%- so it was diastolic Heart failure.  Continued IV Lasix - switched to oral now. Intake and output measurement  close monitoring of renal function is she has worsening renal function also.   Had SOB on presentation, now on room air.  *Anemia with hemoglobin 7.3 in the setting of non-STEMI Status post  1 unit of blood transfusion - stable Hb now.  * Acute on chronic renal failure Baseline creatinine was around 1.3 for 5 month ago, now it is more than 1.8 Patient is on IV Lasix for acute on chronic CHF,  monitor renal function,: Nephrology consult is appreciated. Hold ACE inhibitor and try to maintain the blood pressure.  * History of pulmonary embolism This is as per the previous records but patient is not aware about any kind of blood clots, though she was taking Coumadin at home. Her INR was subtherapeutic , held Coumadin for now  Patient was on heparin drip for non-STEMI - now stopped.   Will wait for vascular- if any plans for surgery or not.   I discussed with her daughter also- she does not know- when PE was there.   I checked PMD's notes in chart- as per him even in march 2016- PE is in past history. I did not find any CT in out records showing PE.   Or Coumadin may have been continued for PVD- in that case , I will leave it up to vascular to resume after possible surgery or not.    Otherwise- for past PE- she don't need to be on that now.  * Hyperglycemia with diabetes given 10 units of Levemir - increased due to hyperglycemia.  on sliding scale coverage.  * Hypertension She is on multiple antihypertensive medications but her blood pressure is running between [id number] to [id number] systolic range, Continue amlodipine, atenolol and Lasix .  * gangene on foot- PVD   S/p vascular procedure last week.   Called vascular consult.   As per him wait for clear demarcation.   Discussed with daughter on phone-  and she would like to talk to vascular team.   I informed them about family wish.    All the records are reviewed and case discussed with Care Management/Social Workerr. Management plans discussed with the patient, family and they are in agreement.  CODE STATUS: DO NOT RESUSCITATE  TOTAL  TIME TAKING CARE OF THIS PATIENT: 35 minutes.  Spoke to her daughter Ms Harrington Challenger on phone- informed her about the findings and plan.  POSSIBLE D/C IN 1-2 DAYS, DEPENDING ON CLINICAL CONDITION. If no plans by vascular for surgery- may d/c to NH soon.  Vaughan Basta M.D on 05/20/2015 at 1:19 PM  Between 7am to 6pm - Pager - [phone] After 6pm go to [email or web address] - password [email or web address] [facility]  Office  [phone]  CC: Primary care physician; [name], MD

## 2015-05-20 NOTE — Progress Notes (Signed)
[facility]  ROUNDING NOTE   Subjective:  Renal function appears to be about the same today. Creatinine currently 1.94. She does appear to have underlying chronic kidney disease with recent baseline creatinine of 1.47. Appears to have necrotic right foot.  Objective:  Vital signs in last 24 hours:  Temp:  [97.9 F (36.6 C)-98.2 F (36.8 C)] 97.9 F (36.6 C) (12/12 1153) Pulse Rate:  [63-65] 64 (12/12 1153) Resp:  [18-22] 22 (12/12 1153) BP: (106-140)/(38-50) 111/38 mmHg (12/12 1153) SpO2:  [96 %-99 %] 97 % (12/12 1153)  Weight change:  Filed Weights   05/15/15 1553 05/15/15 2042  Weight: 75.5 kg (166 lb 7.2 oz) 69.264 kg (152 lb 11.2 oz)    Intake/Output: I/O last 3 completed shifts: In: 76 [P.O.:960] Out: 1125 [Urine:1125]   Intake/Output this shift:  Total I/O In: 290 [P.O.:240; IV Piggyback:50] Out: 0   Physical Exam: General: NAD  Head: Normocephalic, atraumatic. Moist oral mucosal membranes  Eyes: Anicteric  Neck: Supple, trachea midline  Lungs:  Clear to auscultation normal effort  Heart: Regular rate and rhythm  Abdomen:  Soft, nontender, BS present  Extremities:  no peripheral edema. right foot gangrene with demarcation of her midfoot   Neurologic: Nonfocal, moving all four extremities  Skin: No lesions  GU Chronic indwelling foely    Basic Metabolic Panel:  Recent Labs Lab 05/14/15 0727 05/15/15 1611 05/16/15 0420 05/17/15 0406 05/20/15 0609 05/20/15 0821  NA 137 139 143 145 142  --   K 4.1 4.3 3.6 3.8 3.5  --   CL 110 109 112* 110 110  --   CO2 19* 18* 21* 24 25  --   GLUCOSE 200* 556* 316* 167* 131*  --   BUN 57* 82* 82* 78* 78* 73*  CREATININE 1.75* 2.18* 1.88* 1.81* 1.94* 1.94*  CALCIUM 8.0* 8.2* 8.2* 8.7* 8.1*  --     Liver Function Tests:  Recent Labs Lab 05/15/15 1611  AST 105*  ALT 62*  ALKPHOS 32*  BILITOT 0.2*  PROT 7.5  ALBUMIN 2.7*   No results for input(s): LIPASE, AMYLASE in the last 168 hours. No  results for input(s): AMMONIA in the last 168 hours.  CBC:  Recent Labs Lab 05/15/15 1611 05/16/15 0420 05/16/15 1803 05/17/15 0406 05/18/15 0617 05/20/15 0609  WBC 15.0* 12.9*  --  12.6* 10.1 10.8  NEUTROABS 12.9*  --   --   --   --   --   HGB 8.2* 7.3* 9.4* 9.6* 9.1* 8.8*  HCT 26.8* 22.9* 29.4* 30.6* 27.9* 27.5*  MCV 84.7 84.0  --  84.2 83.8 85.6  PLT 620* 487*  --  554* 468* 436    Cardiac Enzymes:  Recent Labs Lab 05/15/15 2046 05/15/15 2304 05/16/15 0250 05/17/15 1152 05/17/15 1734  TROPONINI 15.48* 18.60* 16.31* 3.95* 4.84*    BNP: Invalid input(s): POCBNP  CBG:  Recent Labs Lab 05/19/15 1150 05/19/15 1637 05/19/15 2132 05/20/15 0751 05/20/15 1224  GLUCAP 234* 217* 190* 173* 68*    Microbiology: Results for orders placed or performed during the hospital encounter of 05/15/15  Blood culture (routine x 2)     Status: None   Collection Time: 05/15/15  4:11 PM  Result Value Ref Range Status   Specimen Description BLOOD RIGHT WRIST  Final   Special Requests   Final    BOTTLES DRAWN AEROBIC AND ANAEROBIC [facility],1CCANA   Culture NO GROWTH 5 DAYS  Final   Report Status 05/20/2015 FINAL  Final  Blood culture (routine x 2)     Status: None   Collection Time: 05/15/15  4:20 PM  Result Value Ref Range Status   Specimen Description BLOOD LEFT HAND  Final   Special Requests BOTTLES DRAWN AEROBIC AND ANAEROBIC 5CC  Final   Culture NO GROWTH 5 DAYS  Final   Report Status 05/20/2015 FINAL  Final  MRSA PCR Screening     Status: Abnormal   Collection Time: 05/15/15  8:57 PM  Result Value Ref Range Status   MRSA by PCR POSITIVE (A) NEGATIVE Final    Comment: READ BACK AND VERIFIED BY CASSIE STEWART @0028  05/16/15.Marland KitchenMarland KitchenAJO        The GeneXpert MRSA Assay (FDA approved for NASAL specimens only), is one component of a comprehensive MRSA colonization surveillance program. It is not intended to diagnose MRSA infection nor to guide or monitor treatment  for MRSA infections.     Coagulation Studies:  Recent Labs  05/20/15 0821  LABPROT 20.7*  INR 1.78    Urinalysis: No results for input(s): COLORURINE, LABSPEC, PHURINE, GLUCOSEU, HGBUR, BILIRUBINUR, KETONESUR, PROTEINUR, UROBILINOGEN, NITRITE, LEUKOCYTESUR in the last 72 hours.  Invalid input(s): APPERANCEUR    Imaging: No results found.   Medications:     . amLODipine  5 mg Oral Daily  . antiseptic oral rinse  7 mL Mouth Rinse BID  . aspirin EC  81 mg Oral Daily  . atenolol  50 mg Oral Daily  . cefTRIAXone (ROCEPHIN)  IV  1 g Intravenous Q24H  . Chlorhexidine Gluconate Cloth  6 each Topical Q0600  . cloNIDine  0.1 mg Oral BID  . furosemide  20 mg Oral Daily  . heparin subcutaneous  5,000 Units Subcutaneous 3 times per day  . insulin aspart  0-5 Units Subcutaneous QHS  . insulin aspart  0-9 Units Subcutaneous TID WC  . insulin detemir  30 Units Subcutaneous QHS  . ketoconazole  1 application Topical BID  . magnesium oxide  400 mg Oral Daily  . megestrol  200 mg Oral Daily  . mirtazapine  15 mg Oral QHS  . mupirocin ointment  1 application Nasal BID  . pantoprazole  40 mg Oral QAC breakfast  . pravastatin  20 mg Oral q1800  . sodium chloride  3 mL Intravenous Q12H  . tamsulosin  0.4 mg Oral BID   oxyCODONE, promethazine  Assessment/ Plan:  [name] is a 79 y.o. white female SNF resident with peripheral vascular disease, hypertension, diabetes mellitus type II Insulin dependent, anemia, hyperlipidemia, history of uterine cancer and bladder cancer with chronic foley catheter, who was admitted to [facility] on 05/15/2015  1. Acute renal failure on chronic kidney disease stage III: baseline creatinine range of 1.5-1.8. Presentation of creatinine of 2.88 Acute renal failure from acute coronary syndrome, acute cardiorenal syndrome from ischemic cardiomyopathy, and IV contrast exposure 05/14/15.  Chronic kidney disease secondary to vascular disease, hypertension and  diabetes.  Urinalysis this admission shows LE 3+, protein, TNTC RBCs, TNTC WBCs - Certainly some component of contrast nephropathy noted.  BUN currently 73 with a creatinine of 1.9.  Ideally we would like to have administered IV fluids however patient presented with diastolic heart failure. Continue to monitor renal function daily for now.  2. Hypertension: with acute exacerbation of congestive heart failure. Echocardiogram with diastolic dysfunction .  - blood pressure well-controlled at 111/38.  Continue amlodipine, atenolol, clonidine.  3. Diabetes Mellitus type II Insulin dependent with chronic kidney disease:  hemoglobin A1c 7.4%  - Continue insulin regimen and glucose control.  4. Anemia of CKD: may need Epogen as an outpatient but we'll hold off for now given acute coronary syndrome.  Hemoglobin currently 8.8.    LOS: 5 Seaira Byus 12/12/20164:04 PM

## 2015-05-20 NOTE — Care Management (Signed)
Informed by Beverly Sessions with [facility] that patient was followed by agency in the recent past but discharged from service as aggressive treatment was being pursued for her circulatory issues.  It is verbally reported that patient's daughter contacted [facility] to request hospice service.  Reaching out to patient's daughter to confirm

## 2015-05-20 NOTE — Progress Notes (Signed)
Nutrition Follow-up    INTERVENTION:   Meals and Snacks: Cater to patient preferences Medical Food Supplement Therapy: continue Art therapist, Magic Cup  NUTRITION DIAGNOSIS:   Increased nutrient needs related to chronic illness, wound healing as evidenced by estimated needs.  GOAL:   Patient will meet greater than or equal to 90% of their needs  MONITOR:    (Energy Intake, Anthropometrics, Electrolyte and renal Profile, Pulmonary Profile, Skin)  REASON FOR ASSESSMENT:    (Pressure Ulcer)    ASSESSMENT:   Per MD note, Shelley Fields is a 79 y.o. female with a known history of diabetes, pulmonary embolism, decubitus ulcer, hypertension, hypercholesterolemia, uterine cancer with metastasis, peripheral arterial disease and was seen by vascular Dr.Schiner 12/6 and had angioplasty done on her right lower extremity. Pt now admitted with SOB, pulmonary edema and NSTEMI. Pt on isolation for MRSA.  Diet Order:  Diet heart healthy/carb modified Room service appropriate?: Yes; Fluid consistency:: Thin; Fluid restriction:: 1200 mL Fluid   Energy Intake: pt ate 75% at breakfast this AM, drank Mighty Shake; recorded po intake 25% at meals yesterday; no other intake recorded  Skin:   multiple stage II/III pressure ulcers, gangrenous toe  Electrolyte and Renal Profile:  Recent Labs Lab 05/16/15 0420 05/17/15 0406 05/20/15 0609 05/20/15 0821  BUN 82* 78* 78* 73*  CREATININE 1.88* 1.81* 1.94* 1.94*  NA 143 145 142  --   K 3.6 3.8 3.5  --    Glucose Profile:  Recent Labs  05/19/15 2132 05/20/15 0751 05/20/15 1224  GLUCAP 190* 173* 289*   Meds:  Lasix, ss novolog, levemir  Height:   Ht Readings from Last 1 Encounters:  05/15/15 5' (1.524 m)    Weight:   Wt Readings from Last 1 Encounters:  05/15/15 152 lb 11.2 oz (69.264 kg)   Filed Weights   05/15/15 1553 05/15/15 2042  Weight: 166 lb 7.2 oz (75.5 kg) 152 lb 11.2 oz (69.264 kg)    BMI:  Body mass index is 29.82  kg/(m^2).  Estimated Nutritional Needs:   Kcal:  using IBW of 45.5kg, BEE: 862kcals, TEE: (IF 1.2-1.4)(AF 1.2) 1240-1447kcals  Protein:  55-68g protein (1.2-1.5g/kg)  Fluid:  1138-1365mL of fluid (25-2mL/kg)  EDUCATION NEEDS:   Education needs no appropriate at this time  [place] MS, [place], LDN [phone] Pager

## 2015-05-20 NOTE — Care Management (Signed)
Spoke with patient' s daughter Shelley Fields.  She confirms that it would be her wish for patient to return to [facility] under a hospice plan of care.  Patient has been resident of the facility since July and current stay is covered under medicaid long term care.  She was being followed by [facility] from August 2016 until November.  Discussed that if patient returns to the facility under medicaid- Patient could receive hospice services.  Explained if for some reason patient returned under a skilled medicare plan of care (Which does not seem likely) could not be followed by hospice, but could be followed by palliative care.  Explained that palliative care and hospice are not the same service and that patient can not receive hospice services and skilled nursing under medicare at the same time.   Left note for vascular to contact Ms Huering if plan to proceed with any procedures.  Her kidney function is preventing further angiography of her  lower extremity.

## 2015-05-20 NOTE — Progress Notes (Signed)
I have spoken with the patient's daughter by phone  I will plan to have Podiatry see her to plan for amputation

## 2015-05-21 LAB — BASIC METABOLIC PANEL
Anion gap: 9 (ref 5–15)
BUN: 73 mg/dL — AB (ref 6–20)
CO2: 22 mmol/L (ref 22–32)
CREATININE: 1.81 mg/dL — AB (ref 0.44–1.00)
Calcium: 8.2 mg/dL — ABNORMAL LOW (ref 8.9–10.3)
Chloride: 113 mmol/L — ABNORMAL HIGH (ref 101–111)
GFR calc Af Amer: 30 mL/min — ABNORMAL LOW (ref >60)
GFR, EST NON AFRICAN AMERICAN: 26 mL/min — AB (ref >60)
GLUCOSE: 93 mg/dL (ref 65–99)
POTASSIUM: 3.8 mmol/L (ref 3.5–5.1)
SODIUM: 144 mmol/L (ref 135–145)

## 2015-05-21 LAB — GLUCOSE, CAPILLARY
GLUCOSE-CAPILLARY: 53 mg/dL — AB (ref 65–99)
GLUCOSE-CAPILLARY: 89 mg/dL (ref 65–99)
GLUCOSE-CAPILLARY: 109 mg/dL — AB (ref 65–99)
Glucose-Capillary: 47 mg/dL — ABNORMAL LOW (ref 65–99)
Glucose-Capillary: 86 mg/dL (ref 65–99)
Glucose-Capillary: 151 mg/dL — ABNORMAL HIGH (ref 65–99)

## 2015-05-21 MED ORDER — INSULIN DETEMIR 100 UNIT/ML ~~LOC~~ SOLN
25.0000 [IU] | Freq: Every day | SUBCUTANEOUS | Status: DC
  Administered 2015-05-21 – 2015-05-22 (×2): 25 [IU] via SUBCUTANEOUS
  Filled 2015-05-21 (×3): qty 0.25

## 2015-05-21 NOTE — Care Management Important Message (Signed)
Important Message  Patient Details  Name: ARIAL BUBEL MRN: [id number] Date of Birth: 1935/06/22   Medicare Important Message Given:  Yes    Alvie Heidelberg, RN 05/21/2015, 11:18 AM

## 2015-05-21 NOTE — Progress Notes (Signed)
[facility]  Daily Progress Note   Subjective: Patient hypoglycemic this AM. Being given juice and awaiting repeat fingerstick. Patient somewhat confused this AM. Not sure if baseline or from hypoglycemia as this is the first time I am seeing that patient.  Patient complaining of right lower extremity pain. Stating "my legs hurt."   Objective: Filed Vitals:   05/20/15 1000 05/20/15 1153 05/20/15 1631 05/20/15 2148  BP: 106/42 111/38 96/40 126/70  Pulse: 63 64 57 82  Temp: 97.9 F (36.6 C) 97.9 F (36.6 C) 97.7 F (36.5 C) 98.3 F (36.8 C)  TempSrc: Oral Oral Oral Oral  Resp: 18 22 19 18   Height:      Weight:      SpO2: 96% 97% 100% 100%    Intake/Output Summary (Last 24 hours) at 05/21/15 [id number] Last data filed at 05/21/15 [id number]  Gross per 24 hour  Intake    290 ml  Output    625 ml  Net   -335 ml   Physical Exam: Confused, NAD CV: RRR Pulmonary: CTA Bilaterally Abdomen: Soft, Nontender, Nondistended Vascular:  Right Extremity: Foot - dry gangrene proximally to about mid foot. No cellulitis or infection noted. No pulses palpable, pitting edema to ankle.    Laboratory: CBC    Component Value Date/Time   WBC 10.8 05/20/2015 0609   WBC 9.7 10/03/2014 1030   HGB 8.8* 05/20/2015 0609   HGB 9.5* 10/03/2014 1030   HCT 27.5* 05/20/2015 0609   HCT 30.7* 10/03/2014 1030   PLT 436 05/20/2015 0609   PLT 325 10/03/2014 1030    BMET    Component Value Date/Time   NA 144 05/21/2015 0531   NA 136 07/13/2014 1022   K 3.8 05/21/2015 0531   K 4.8 07/13/2014 1022   CL 113* 05/21/2015 0531   CL 101 07/13/2014 1022   CO2 22 05/21/2015 0531   CO2 26 07/13/2014 1022   GLUCOSE 93 05/21/2015 0531   GLUCOSE 218* 07/13/2014 1022   BUN 73* 05/21/2015 0531   BUN 27* 09/04/2014 1113   CREATININE 1.81* 05/21/2015 0531   CREATININE 1.20* 10/03/2014 1030   CALCIUM 8.2* 05/21/2015 0531   CALCIUM 8.2* 07/13/2014 1022   GFRNONAA 26* 05/21/2015 0531   GFRNONAA  44* 10/03/2014 1030   GFRNONAA 31* 07/13/2014 1022   GFRAA 30* 05/21/2015 0531   GFRAA 50* 10/03/2014 1030   GFRAA 78* 07/13/2014 1022   Assessment/Planning: 79 year old female with dry gangrene of the right foot 1) Awaiting recommendations from podiatry in regard to amputation  2) Would not recommend angiogram for the third time. 3) Continue care as per primary team 4) Discussed with Dr. Eber Hong [facility] PA-C 05/21/2015 8:03 AM

## 2015-05-21 NOTE — Progress Notes (Signed)
[facility]  ROUNDING NOTE   Subjective:  Renal function has improved slightly today. Creatinine currently down to 1.81. BUN remains high at 73. Podiatry has been consulted for possible amputation of her right foot.  Objective:  Vital signs in last 24 hours:  Temp:  [97.6 F (36.4 C)-98.3 F (36.8 C)] 97.7 F (36.5 C) (12/13 0912) Pulse Rate:  [57-82] 65 (12/13 0912) Resp:  [16-22] 20 (12/13 0912) BP: (96-137)/(38-70) 137/53 mmHg (12/13 0912) SpO2:  [94 %-100 %] 100 % (12/13 0912)  Weight change:  Filed Weights   05/15/15 1553 05/15/15 2042  Weight: 75.5 kg (166 lb 7.2 oz) 69.264 kg (152 lb 11.2 oz)    Intake/Output: I/O last 3 completed shifts: In: 290 [P.O.:240; IV Piggyback:50] Out: [id number] [Urine:875]   Intake/Output this shift:  Total I/O In: 400 [P.O.:400] Out: -   Physical Exam: General: NAD  Head: Normocephalic, atraumatic. Moist oral mucosal membranes  Eyes: Anicteric  Neck: Supple, trachea midline  Lungs:  Clear to auscultation normal effort  Heart: Regular rate and rhythm  Abdomen:  Soft, nontender, BS present  Extremities:  no peripheral edema. right foot gangrene with demarcation of her midfoot   Neurologic: Nonfocal, moving all four extremities  Skin: No lesions  GU Chronic indwelling foely    Basic Metabolic Panel:  Recent Labs Lab 05/15/15 1611 05/16/15 0420 05/17/15 0406 05/20/15 0609 05/20/15 0821 05/21/15 0531  NA 139 143 145 142  --  144  K 4.3 3.6 3.8 3.5  --  3.8  CL 109 112* 110 110  --  113*  CO2 18* 21* 24 25  --  22  GLUCOSE 556* 316* 167* 131*  --  93  BUN 82* 82* 78* 78* 73* 73*  CREATININE 2.18* 1.88* 1.81* 1.94* 1.94* 1.81*  CALCIUM 8.2* 8.2* 8.7* 8.1*  --  8.2*    Liver Function Tests:  Recent Labs Lab 05/15/15 1611  AST 105*  ALT 62*  ALKPHOS 32*  BILITOT 0.2*  PROT 7.5  ALBUMIN 2.7*   No results for input(s): LIPASE, AMYLASE in the last 168 hours. No results for input(s): AMMONIA in the last  168 hours.  CBC:  Recent Labs Lab 05/15/15 1611 05/16/15 0420 05/16/15 1803 05/17/15 0406 05/18/15 0617 05/20/15 0609  WBC 15.0* 12.9*  --  12.6* 10.1 10.8  NEUTROABS 12.9*  --   --   --   --   --   HGB 8.2* 7.3* 9.4* 9.6* 9.1* 8.8*  HCT 26.8* 22.9* 29.4* 30.6* 27.9* 27.5*  MCV 84.7 84.0  --  84.2 83.8 85.6  PLT 620* 487*  --  554* 468* 436    Cardiac Enzymes:  Recent Labs Lab 05/15/15 2046 05/15/15 2304 05/16/15 0250 05/17/15 1152 05/17/15 1734  TROPONINI 15.48* 18.60* 16.31* 3.95* 4.84*    BNP: Invalid input(s): POCBNP  CBG:  Recent Labs Lab 05/20/15 2141 05/21/15 0740 05/21/15 0742 05/21/15 0815 05/21/15 1116  GLUCAP 175* 62* 20* 26 151*    Microbiology: Results for orders placed or performed during the hospital encounter of 05/15/15  Blood culture (routine x 2)     Status: None   Collection Time: 05/15/15  4:11 PM  Result Value Ref Range Status   Specimen Description BLOOD RIGHT WRIST  Final   Special Requests   Final    BOTTLES DRAWN AEROBIC AND ANAEROBIC 1CC AEROBIC,1CCANA   Culture NO GROWTH 5 DAYS  Final   Report Status 05/20/2015 FINAL  Final  Blood culture (routine x  2)     Status: None   Collection Time: 05/15/15  4:20 PM  Result Value Ref Range Status   Specimen Description BLOOD LEFT HAND  Final   Special Requests BOTTLES DRAWN AEROBIC AND ANAEROBIC 5CC  Final   Culture NO GROWTH 5 DAYS  Final   Report Status 05/20/2015 FINAL  Final  MRSA PCR Screening     Status: Abnormal   Collection Time: 05/15/15  8:57 PM  Result Value Ref Range Status   MRSA by PCR POSITIVE (A) NEGATIVE Final    Comment: READ BACK AND VERIFIED BY CASSIE STEWART @0028  05/16/15.Marland KitchenMarland KitchenAJO        The GeneXpert MRSA Assay (FDA approved for NASAL specimens only), is one component of a comprehensive MRSA colonization surveillance program. It is not intended to diagnose MRSA infection nor to guide or monitor treatment for MRSA infections.     Coagulation  Studies:  Recent Labs  05/20/15 0821  LABPROT 20.7*  INR 1.78    Urinalysis: No results for input(s): COLORURINE, LABSPEC, PHURINE, GLUCOSEU, HGBUR, BILIRUBINUR, KETONESUR, PROTEINUR, UROBILINOGEN, NITRITE, LEUKOCYTESUR in the last 72 hours.  Invalid input(s): APPERANCEUR    Imaging: No results found.   Medications:     . amLODipine  5 mg Oral Daily  . antiseptic oral rinse  7 mL Mouth Rinse BID  . aspirin EC  81 mg Oral Daily  . atenolol  50 mg Oral Daily  . cefTRIAXone (ROCEPHIN)  IV  1 g Intravenous Q24H  . cloNIDine  0.1 mg Oral BID  . furosemide  20 mg Oral Daily  . heparin subcutaneous  5,000 Units Subcutaneous 3 times per day  . insulin aspart  0-5 Units Subcutaneous QHS  . insulin aspart  0-9 Units Subcutaneous TID WC  . insulin detemir  30 Units Subcutaneous QHS  . ketoconazole  1 application Topical BID  . magnesium oxide  400 mg Oral Daily  . megestrol  200 mg Oral Daily  . mirtazapine  15 mg Oral QHS  . pantoprazole  40 mg Oral QAC breakfast  . pravastatin  20 mg Oral q1800  . sodium chloride  3 mL Intravenous Q12H  . tamsulosin  0.4 mg Oral BID   oxyCODONE, promethazine  Assessment/ Plan:  Shelley Fields is a 79 y.o. white female SNF resident with peripheral vascular disease, hypertension, diabetes mellitus type II Insulin dependent, anemia, hyperlipidemia, history of uterine cancer and bladder cancer with chronic foley catheter, who was admitted to [facility] on 05/15/2015  1. Acute renal failure on chronic kidney disease stage III: baseline creatinine range of 1.5-1.8. Presentation of creatinine of 2.88. Acute renal failure from acute coronary syndrome, acute cardiorenal syndrome from ischemic cardiomyopathy, and IV contrast exposure 05/14/15.  Chronic kidney disease secondary to vascular disease, hypertension and diabetes.  Urinalysis this admission shows LE 3+, protein, TNTC RBCs, TNTC WBCs - BUN remains high but creatinine close to her baseline of 1.8 at  present. Avoiding IV fluids as she was previously in diastolic heart failure. Currently awaiting further input from podiatry regarding the right foot.  2. Hypertension: with acute exacerbation of congestive heart failure. Echocardiogram with diastolic dysfunction .  - blood pressure 137/53 this a.m.  Continue amlodipine, atenolol, clonidine.  3. Diabetes Mellitus type II Insulin dependent with chronic kidney disease:  hemoglobin A1c 7.4%  - Continue insulin regimen and glucose control.  4. Anemia of CKD: may need Epogen as an outpatient but we'll hold off for now given acute coronary syndrome.  Hemoglobin currently 8.8.    LOS: 6 Jovaun Levene 12/13/201611:25 AM

## 2015-05-21 NOTE — Progress Notes (Signed)
[facility] [place] NAME: Shelley Fields    MR#:  [id number]  DATE OF BIRTH:  06-06-1936  SUBJECTIVE:  CHIEF COMPLAINT:  Patient is resting comfortably. Initially admitted with SOB and found to have NSTEMI- with some renal insufficiency, given heparin drip. Had vascular procedure twice in last 3 months to salvage her foot on right side, Right  foot is turning black. No pain. Off heparin drip. Distal half foot is completely black and she have some pain in there.  REVIEW OF SYSTEMS:  CONSTITUTIONAL: No fever, fatigue or weakness.  EYES: No blurred or double vision.  EARS, NOSE, AND THROAT: No tinnitus or ear pain.  RESPIRATORY: No cough, shortness of breath, wheezing or hemoptysis.  CARDIOVASCULAR: No chest pain, orthopnea, edema.  GASTROINTESTINAL: No nausea, vomiting, diarrhea or abdominal pain.  GENITOURINARY: No dysuria, hematuria.  ENDOCRINE: No polyuria, nocturia,  HEMATOLOGY: No anemia, easy bruising or bleeding SKIN: No rash or lesion. MUSCULOSKELETAL: No joint pain or arthritis. Has chronic peripheral vascular disease and had angioplasty for right foot gangrene recently - pain in foot and leg. NEUROLOGIC: No tingling, numbness, weakness.  PSYCHIATRY: No anxiety or depression.   DRUG ALLERGIES:   Allergies  Allergen Reactions  . Contrast Media [Iodinated Diagnostic Agents]     VITALS:  Blood pressure 134/33, pulse 64, temperature 98.8 F (37.1 C), temperature source Oral, resp. rate 16, height 5' (1.524 m), weight 69.264 kg (152 lb 11.2 oz), SpO2 100 %.  PHYSICAL EXAMINATION:  GENERAL:  79 y.o.-year-old patient lying in the bed with no acute distress.  EYES: Pupils equal, round, reactive to light and accommodation. No scleral icterus. Extraocular muscles intact.  HEENT: Head atraumatic, normocephalic. Oropharynx and nasopharynx clear.  NECK:  Supple, no jugular venous distention. No thyroid enlargement, no tenderness.   LUNGS: Normal breath sounds bilaterally, no wheezing, rales,rhonchi or crepitation. No use of accessory muscles of respiration.  CARDIOVASCULAR: S1, S2 normal. No murmurs, rubs, or gallops.  ABDOMEN: Soft, nontender, nondistended. Bowel sounds present. No organomegaly or mass.  EXTREMITIES: No pedal edema, . Diminished peripheral pulses. Right distal foot is turning black on toes, and not able to palpate pulse on right dorsalis pedis artery. NEUROLOGIC: Cranial nerves II through XII are intact. Muscle strength 5/5 in all extremities. Sensation intact. Gait not checked.  PSYCHIATRIC: The patient is alert and oriented x 3.  SKIN: No obvious rash, lesion, or ulcer.    LABORATORY PANEL:   CBC  Recent Labs Lab 05/20/15 0609  WBC 10.8  HGB 8.8*  HCT 27.5*  PLT 436   ------------------------------------------------------------------------------------------------------------------  Chemistries   Recent Labs Lab 05/15/15 1611  05/21/15 0531  NA 139  < > 144  K 4.3  < > 3.8  CL 109  < > 113*  CO2 18*  < > 22  GLUCOSE 556*  < > 93  BUN 82*  < > 73*  CREATININE 2.18*  < > 1.81*  CALCIUM 8.2*  < > 8.2*  AST 105*  --   --   ALT 62*  --   --   ALKPHOS 32*  --   --   BILITOT 0.2*  --   --   < > = values in this interval not displayed. ------------------------------------------------------------------------------------------------------------------  Cardiac Enzymes  Recent Labs Lab 05/17/15 1734  TROPONINI 4.84*   ------------------------------------------------------------------------------------------------------------------  RADIOLOGY:  No results found.  EKG:   Orders placed or performed during the hospital encounter of 05/15/15  .  ED EKG  . ED EKG    ASSESSMENT AND PLAN:   * Non-ST elevation MI Patient is currently asymptomatic , continued Heparin IV drip for 48-72 hours and then stopped. Cardiology is defering   Cardiac cath eterization  Continue Aspirin,  statin Serial troponins 18.6-16.31 Reviewed echo.  Discontinue telemetry.stable.  * Acute on chronic Congestive heart failure EF 55%- so it was diastolic Heart failure.  Continued IV Lasix - switched to oral. Intake and output measurement  close monitoring of renal function is she has worsening renal function also.   Had SOB on presentation, now on room air.  *Anemia with hemoglobin 7.3 in the setting of non-STEMI Status post  1 unit of blood transfusion - stable Hb now.  * Acute on chronic renal failure Baseline creatinine was around 1.3 for 5 month ago, now it is more than 1.8 Patient is on IV Lasix for acute on chronic CHF,  monitor renal function,: Nephrology consult is appreciated. Hold ACE inhibitor and try to maintain the blood pressure.  stable renal func now.  * History of pulmonary embolism This is as per the previous records but patient is not aware about any kind of blood clots, though she was taking Coumadin at home. Her INR was subtherapeutic , held Coumadin for now  Patient was on heparin drip for non-STEMI - now stopped.   Will wait for vascular- as there is plan for surgery.   I discussed with her daughter also- she does not know- when PE was there.   I checked PMD's notes in old chart- as per him even in march 2016- PE is in past history. I did not find any CT in out records showing PE.   Or Coumadin may have been continued for PVD- in that case , I will leave it up to vascular to resume after possible surgery or not.    Otherwise- for past PE- she don't need to be on that now.  * Hyperglycemia with diabetes given 10 units of Levemir - increased to 30 due to hyperglycemia.  on sliding scale coverage.   Had hypoglycemia on 05/21/15 morning- decreased to 25 units levemir.  * Hypertension She is on multiple antihypertensive medications but her blood pressure is running between [id number] to [id number] systolic range, Continue amlodipine, atenolol and Lasix .  *  gangene on foot- PVD   S/p vascular procedure before admission. Total twice in last 3 months.   Called vascular consult.   Vascular called podiatry for amputation.   Waiting for final decision about surgery.  All the records are reviewed and case discussed with Care Management/Social Workerr. Management plans discussed with the patient, family and they are in agreement.  CODE STATUS: DO NOT RESUSCITATE  TOTAL  TIME TAKING CARE OF THIS PATIENT: 35 minutes.   POSSIBLE D/C IN 2-3 DAYS, DEPENDING ON CLINICAL CONDITION.   Vaughan Basta M.D on 05/21/2015 at 9:20 PM  Between 7am to 6pm - Pager - [phone] After 6pm go to [email or web address] - password [email or web address] [facility]  Office  [phone]  CC: Primary care physician; [name], MD

## 2015-05-21 NOTE — Progress Notes (Signed)
I attempted to contact patient's daughter by phone received a voicemail. I left a message stating that I would attempt to get in touch with her tomorrow

## 2015-05-21 NOTE — Consult Note (Signed)
Patient Demographics  Shelley Fields, is a 79 y.o. female   MRN: [id number]   DOB - 1935-11-15  Admit Date - 05/15/2015    Outpatient Primary MD for the patient is [name], MD  Consult requested in the Hospital by Vaughan Basta, MD, On 05/21/2015    Reason for consult : Gangrene all the toes and distal forefoot left.   With History of -  Past Medical History  Diagnosis Date  . Diabetes mellitus without complication ([place])   . C. difficile diarrhea   . Uterine cancer ([place])   . Hypogammaglobulinemia ([place])   . Pulmonary embolism ([place])   . Decubitus ulcers   . Hypertension   . Hypercholesterolemia   . Incontinence of urine   . Cancer [facility])     Endometrial Cancer  . Cervical cancer ([place])     with radiation 45 years ago...  . Arthritis   . GERD (gastroesophageal reflux disease)   . Bladder cancer ([place])   . Decubitus ulcer of buttock, stage 1   . Peripheral vascular disease ([place])   . Anemia       Past Surgical History  Procedure Laterality Date  . Total abdominal hysterectomy w/ bilateral salpingoophorectomy  07/29/2010  . Stent in leg  2016  . Cataract extraction, bilateral    . Bladder surgery    . Femoral endarterectomy      right  . Leg surgery      right leg  . Portacath placement      right  . Esophagogastroduodenoscopy N/A 12/14/2014    Procedure: ESOPHAGOGASTRODUODENOSCOPY (EGD);  Surgeon: Hulen Luster, MD;  [facility] ENDOSCOPY;  Service: Endoscopy;  Laterality: N/A;  . Peripheral vascular catheterization Right 04/05/2015    Procedure: Lower Extremity Angiography;  Surgeon: Katha Cabal, MD;  Location: [place] CV LAB;  Service: Cardiovascular;  Laterality: Right;  . Peripheral vascular catheterization  04/05/2015    Procedure: Lower Extremity Intervention;   Surgeon: Katha Cabal, MD;  Location: [place] CV LAB;  Service: Cardiovascular;;  . Peripheral vascular catheterization Right 05/14/2015    Procedure: Lower Extremity Angiography;  Surgeon: Katha Cabal, MD;  Location: [place] CV LAB;  Service: Cardiovascular;  Laterality: Right;  . Peripheral vascular catheterization Right 05/14/2015    Procedure: Lower Extremity Intervention;  Surgeon: Katha Cabal, MD;  Location: [place] CV LAB;  Service: Cardiovascular;  Laterality: Right;  . Angioplasty      in for   Chief Complaint  Patient presents with  . Shortness of Breath     HPI  Shelley Fields  is a 79 y.o. female,     Review of Systems    In addition to the HPI above,  No Fever-chills, No Headache, No changes with Vision or hearing, No problems swallowing food or Liquids, No Chest pain, Cough or Shortness of Breath, No Abdominal pain, No Nausea or Vommitting, Bowel movements are regular, No Blood in stool or Urine, No dysuria, No new skin  rashes or bruises, No new joints pains-aches,  No new weakness, tingling, numbness in any extremity, No recent weight gain or loss, No polyuria, polydypsia or polyphagia, No significant Mental Stressors.  A full 10 point Review of Systems was done, except as stated above, all other Review of Systems were negative.   Social History Social History  Substance Use Topics  . Smoking status: Never Smoker   . Smokeless tobacco: Not on file  . Alcohol Use: No     Family History Family History  Problem Relation Age of Onset  . Hypertension Mother   . Diabetes Mellitus II Mother   . Coronary artery disease Mother   . Hypertension Father   . Diabetes Mellitus II Father   . CAD Father     Prior to Admission medications   Medication Sig Start Date End Date Taking? Authorizing Provider  amLODipine (NORVASC) 5 MG tablet TAKE 1 TABLET BY MOUTH EVERY DAY 02/12/15   Arlis Porta., MD  aspirin 81 MG tablet  Take 81 mg by mouth daily.    Historical Provider, MD  atenolol (TENORMIN) 50 MG tablet Take 50 mg by mouth daily. 10/24/14   Historical Provider, MD  benazepril (LOTENSIN) 40 MG tablet Take 40 mg by mouth daily. 11/01/14   Historical Provider, MD  cloNIDine (CATAPRES) 0.2 MG tablet Take 0.2 mg by mouth 2 (two) times daily. 10/24/14   Historical Provider, MD  furosemide (LASIX) 20 MG tablet Take 20 mg by mouth every other day. 10/24/14   Historical Provider, MD  HUMULIN 70/30 (70-30) 100 UNIT/ML injection 16 Units daily at 6 PM. 11/01/14   Historical Provider, MD  hydrALAZINE (APRESOLINE) 100 MG tablet Take 100 mg by mouth 2 (two) times daily. 10/24/14   Historical Provider, MD  insulin aspart (NOVOLOG) cartridge Inject into the skin 3 (three) times daily with meals.    Historical Provider, MD  ketoconazole (NIZORAL) 2 % cream Apply 1 application topically 2 (two) times daily.    Historical Provider, MD  lovastatin (MEVACOR) 20 MG tablet TAKE 1 TABLET BY MOUTH AT BEDTIME 01/28/15   Arlis Porta., MD  magnesium oxide (MAG-OX) 400 MG tablet Take 400 mg by mouth daily.    Historical Provider, MD  meclizine (ANTIVERT) 25 MG tablet Take 25 mg by mouth daily. 10/24/14   Historical Provider, MD  megestrol (MEGACE ES) 625 MG/5ML suspension Take 1.6 mLs (200 mg total) by mouth daily. to stimulate appetite 11/23/14   Lequita Asal, MD  mirtazapine (REMERON) 15 MG tablet Take 15 mg by mouth at bedtime.  10/24/14   Historical Provider, MD  MORPHINE SULFATE PO Take 5 mg by mouth every 3 (three) hours as needed.    Historical Provider, MD  mupirocin ointment (BACTROBAN) 2 %  01/14/15   Historical Provider, MD  nystatin cream (MYCOSTATIN) Apply 1 application topically 2 (two) times daily. Apply to buttocks    Historical Provider, MD  omeprazole (PRILOSEC) 20 MG capsule Take 20 mg by mouth every other day.  08/24/14   Historical Provider, MD  oxyCODONE (ROXICODONE) 15 MG immediate release tablet Take 15 mg by mouth  every 4 (four) hours as needed for pain.    Historical Provider, MD  potassium chloride (K-DUR) 10 MEQ tablet Take 1 tablet by mouth daily. 12/03/14   Historical Provider, MD  promethazine (PHENERGAN) 25 MG tablet Take 25 mg by mouth every 8 (eight) hours as needed for nausea or vomiting.    Historical Provider, MD  SANTYL ointment  10/24/14   Historical Provider, MD  tamsulosin (FLOMAX) 0.4 MG CAPS capsule Take 0.4 mg by mouth 2 (two) times daily. 10/24/14   Historical Provider, MD  VITAMIN K, PHYTONADIONE, PO Take 1.25 mg by mouth 2 (two) times a week. Mon and friday    Historical Provider, MD  warfarin (COUMADIN) 1 MG tablet Take 1.5 mg by mouth daily.    Historical Provider, MD    Anti-infectives    Start     Dose/Rate Route Frequency Ordered Stop   05/19/15 0945  cefTRIAXone (ROCEPHIN) 1 g in dextrose 5 % 50 mL IVPB     1 g 100 mL/hr over 30 Minutes Intravenous Every 24 hours 05/19/15 0935     05/15/15 1715  piperacillin-tazobactam (ZOSYN) IVPB 3.375 g     3.375 g 12.5 mL/hr over 240 Minutes Intravenous  Once 05/15/15 1703 05/15/15 2130      Scheduled Meds: . amLODipine  5 mg Oral Daily  . antiseptic oral rinse  7 mL Mouth Rinse BID  . aspirin EC  81 mg Oral Daily  . atenolol  50 mg Oral Daily  . cefTRIAXone (ROCEPHIN)  IV  1 g Intravenous Q24H  . cloNIDine  0.1 mg Oral BID  . furosemide  20 mg Oral Daily  . heparin subcutaneous  5,000 Units Subcutaneous 3 times per day  . insulin aspart  0-5 Units Subcutaneous QHS  . insulin aspart  0-9 Units Subcutaneous TID WC  . insulin detemir  30 Units Subcutaneous QHS  . ketoconazole  1 application Topical BID  . magnesium oxide  400 mg Oral Daily  . megestrol  200 mg Oral Daily  . mirtazapine  15 mg Oral QHS  . pantoprazole  40 mg Oral QAC breakfast  . pravastatin  20 mg Oral q1800  . sodium chloride  3 mL Intravenous Q12H  . tamsulosin  0.4 mg Oral BID   Continuous Infusions:  PRN Meds:.oxyCODONE, promethazine  Allergies    Allergen Reactions  . Contrast Media [Iodinated Diagnostic Agents]     Physical Exam  Vitals  Blood pressure 134/33, pulse 64, temperature 98.8 F (37.1 C), temperature source Oral, resp. rate 16, height 5' (1.524 m), weight 69.264 kg (152 lb 11.2 oz), SpO2 100 %.  Lower Extremity exam:  Vascular: Nonpalpable pulses bilaterally. Significant gangrenous changes to all the digits to the left foot.  Dermatological: Gangrene as above. Progression of gangrene is dorsally to the midshaft region of the metatarsals. Maybe extending a little bit distal to that. Plantarly the gangrene goes to about the same region but there is some duskiness and appearance to the skin proximal to the gangrenous line of demarcation.  Neurological: Patient likely has significant neuropathy she doesn't seem to be in a lot of pain with her foot at this juncture.   Data Review  CBC  Recent Labs Lab 05/15/15 1611 05/16/15 0420 05/16/15 1803 05/17/15 0406 05/18/15 0617 05/20/15 0609  WBC 15.0* 12.9*  --  12.6* 10.1 10.8  HGB 8.2* 7.3* 9.4* 9.6* 9.1* 8.8*  HCT 26.8* 22.9* 29.4* 30.6* 27.9* 27.5*  PLT 620* 487*  --  554* 468* 436  MCV 84.7 84.0  --  84.2 83.8 85.6  MCH 25.9* 26.8  --  26.4 27.5 27.6  MCHC 30.6* 31.9*  --  31.4* 32.8 32.2  RDW 18.6* 18.3*  --  17.7* 17.8* 18.2*  LYMPHSABS 1.1  --   --   --   --   --  MONOABS 1.0*  --   --   --   --   --   EOSABS 0.0  --   --   --   --   --   BASOSABS 0.0  --   --   --   --   --    ------------------------------------------------------------------------------------------------------------------  Chemistries   Recent Labs Lab 05/15/15 1611 05/16/15 0420 05/17/15 0406 05/20/15 0609 05/20/15 0821 05/21/15 0531  NA 139 143 145 142  --  144  K 4.3 3.6 3.8 3.5  --  3.8  CL 109 112* 110 110  --  113*  CO2 18* 21* 24 25  --  22  GLUCOSE 556* 316* 167* 131*  --  93  BUN 82* 82* 78* 78* 73* 73*  CREATININE 2.18* 1.88* 1.81* 1.94* 1.94* 1.81*   CALCIUM 8.2* 8.2* 8.7* 8.1*  --  8.2*  AST 105*  --   --   --   --   --   ALT 62*  --   --   --   --   --   ALKPHOS 32*  --   --   --   --   --   BILITOT 0.2*  --   --   --   --   --    ------------------------------------------------------------------------------------------------------------------ estimated creatinine clearance is 22.6 mL/min (by C-G formula based on Cr of 1.81). ------------------------------------------------------------------------------------------------------------------ No results for input(s): TSH, T4TOTAL, T3FREE, THYROIDAB in the last 72 hours.  Invalid input(s): FREET3   Coagulation profile  Recent Labs Lab 05/15/15 1611 05/20/15 0821  INR 1.62 1.78   ------------------------------------------------------------------------------------------------------------------- No results for input(s): DDIMER in the last 72 hours. -------------------------------------------------------------------------------------------------------------------  Cardiac Enzymes  Recent Labs Lab 05/16/15 0250 05/17/15 1152 05/17/15 1734  TROPONINI 16.31* 3.95* 4.84*   ------------------------------------------------------------------------------------------------------------------ Invalid input(s): POCBNP   ---------------------------------------------------------------------------------------------------------------  Urinalysis    Component Value Date/Time   COLORURINE YELLOW* 05/17/2015 1052   COLORURINE Straw 10/03/2013 0850   APPEARANCEUR HAZY* 05/17/2015 1052   APPEARANCEUR Hazy 10/03/2013 0850   LABSPEC 1.012 05/17/2015 1052   LABSPEC 1.008 10/03/2013 0850   PHURINE 5.0 05/17/2015 1052   PHURINE 7.0 10/03/2013 0850   GLUCOSEU 50* 05/17/2015 1052   GLUCOSEU 50 mg/dL 10/03/2013 0850   HGBUR 2+* 05/17/2015 1052   HGBUR Negative 10/03/2013 0850   BILIRUBINUR NEGATIVE 05/17/2015 1052   BILIRUBINUR Negative 10/03/2013 0850   KETONESUR NEGATIVE 05/17/2015 1052    KETONESUR Negative 10/03/2013 0850   PROTEINUR 100* 05/17/2015 1052   PROTEINUR 100 mg/dL 10/03/2013 0850   NITRITE NEGATIVE 05/17/2015 1052   NITRITE Negative 10/03/2013 0850   LEUKOCYTESUR 3+* 05/17/2015 1052   LEUKOCYTESUR 3+ 10/03/2013 0850    Assessment & Plan: Severe gangrenous changes to the left foot involving the toes and distal metatarsal region. She's had vascular procedures done to try to reestablish flow to the region. The healthier pinker tissue on the plantar aspect of the foot stops at an area just distal to the skin of the heel. The skin from there out to the gangrenous line of demarcation is a little duskier. Dorsally there is lateral demarcation at the midshaft of the metatarsals. Distal that is significant gangrenous changes.   Plan: No question that this gangrenous material needs to be removed. I have concerns about the possible healing capacity of a trans-metatarsal amputation. Even if we remove the metatarsals completely there still may not be enough viable tissue to create a plantar flap that will be viable. A more predictable result  would be a below-knee amputation but it depends on essentially what the family wants to do and try. I will discuss it with Dr. Ronalee Belts and with the family regarding the potential for surgical approach.    Family Communication: Will call daughter and discuss the issue.   Thank you for the consult, we will follow the patient with you in the Hospital.   Perry Mount M.D on 05/21/2015 at 6:01 PM  .

## 2015-05-22 LAB — BASIC METABOLIC PANEL
Anion gap: 8 (ref 5–15)
BUN: 70 mg/dL — AB (ref 6–20)
CO2: 22 mmol/L (ref 22–32)
CREATININE: 1.93 mg/dL — AB (ref 0.44–1.00)
Calcium: 7.8 mg/dL — ABNORMAL LOW (ref 8.9–10.3)
Chloride: 111 mmol/L (ref 101–111)
GFR calc Af Amer: 28 mL/min — ABNORMAL LOW (ref >60)
GFR, EST NON AFRICAN AMERICAN: 24 mL/min — AB (ref >60)
Glucose, Bld: 133 mg/dL — ABNORMAL HIGH (ref 65–99)
POTASSIUM: 3.9 mmol/L (ref 3.5–5.1)
SODIUM: 141 mmol/L (ref 135–145)

## 2015-05-22 LAB — GLUCOSE, CAPILLARY
GLUCOSE-CAPILLARY: 106 mg/dL — AB (ref 65–99)
GLUCOSE-CAPILLARY: 182 mg/dL — AB (ref 65–99)
GLUCOSE-CAPILLARY: 135 mg/dL — AB (ref 65–99)
GLUCOSE-CAPILLARY: 151 mg/dL — AB (ref 65–99)

## 2015-05-22 LAB — CBC
HCT: 25.6 % — ABNORMAL LOW (ref 35.0–47.0)
HEMOGLOBIN: 8 g/dL — AB (ref 12.0–16.0)
MCH: 26.9 pg (ref 26.0–34.0)
MCHC: 31.5 g/dL — AB (ref 32.0–36.0)
MCV: 85.5 fL (ref 80.0–100.0)
Platelets: 338 10*3/uL (ref 150–440)
RBC: 2.99 MIL/uL — ABNORMAL LOW (ref 3.80–5.20)
RDW: 18.3 % — AB (ref 11.5–14.5)
WBC: 7.7 10*3/uL (ref 3.6–11.0)

## 2015-05-22 MED ORDER — LORAZEPAM 2 MG/ML IJ SOLN
0.5000 mg | Freq: Once | INTRAMUSCULAR | Status: AC
  Administered 2015-05-22: 0.5 mg via INTRAVENOUS
  Filled 2015-05-22: qty 1

## 2015-05-22 MED ORDER — ALPRAZOLAM 0.5 MG PO TABS
0.5000 mg | ORAL_TABLET | Freq: Two times a day (BID) | ORAL | Status: DC
  Administered 2015-05-22 – 2015-05-24 (×4): 0.5 mg via ORAL
  Filled 2015-05-22 (×5): qty 1

## 2015-05-22 MED ORDER — TAMSULOSIN HCL 0.4 MG PO CAPS
0.4000 mg | ORAL_CAPSULE | Freq: Every day | ORAL | Status: DC
  Administered 2015-05-23 – 2015-05-24 (×2): 0.4 mg via ORAL
  Filled 2015-05-22 (×2): qty 1

## 2015-05-22 MED ORDER — ATORVASTATIN CALCIUM 20 MG PO TABS
40.0000 mg | ORAL_TABLET | Freq: Every day | ORAL | Status: DC
  Administered 2015-05-22 – 2015-05-23 (×2): 40 mg via ORAL
  Filled 2015-05-22 (×2): qty 2

## 2015-05-22 MED ORDER — SERTRALINE HCL 50 MG PO TABS
25.0000 mg | ORAL_TABLET | Freq: Every day | ORAL | Status: DC
  Administered 2015-05-22 – 2015-05-24 (×3): 25 mg via ORAL
  Filled 2015-05-22 (×3): qty 1

## 2015-05-22 NOTE — Progress Notes (Signed)
[facility]  ROUNDING NOTE   Subjective:  Renal function slightly worse today. Creatinine currently 1.9 with a BUN of 70. Vascular surgery attempting to get in touch with the patient's daughter.   Objective:  Vital signs in last 24 hours:  Temp:  [98.1 F (36.7 C)-98.7 F (37.1 C)] 98.4 F (36.9 C) (12/14 1259) Pulse Rate:  [56-135] 60 (12/14 1259) Resp:  [16-18] 18 (12/14 0604) BP: (108-137)/(35-58) 124/35 mmHg (12/14 1259) SpO2:  [92 %-99 %] 98 % (12/14 1259)  Weight change:  Filed Weights   05/15/15 1553 05/15/15 2042  Weight: 75.5 kg (166 lb 7.2 oz) 69.264 kg (152 lb 11.2 oz)    Intake/Output: I/O last 3 completed shifts: In: 400 [P.O.:400] Out: 1350 [Urine:1350]   Intake/Output this shift:  Total I/O In: 55 [P.O.:59] Out: 125 [Urine:125]  Physical Exam: General: NAD  Head: Normocephalic, atraumatic. Moist oral mucosal membranes  Eyes: Anicteric  Neck: Supple, trachea midline  Lungs:  Clear to auscultation normal effort  Heart: Regular rate and rhythm  Abdomen:  Soft, nontender, BS present  Extremities:  no peripheral edema. right foot gangrene with demarcation of her midfoot   Neurologic: Nonfocal, moving all four extremities  Skin: No lesions  GU Chronic indwelling foely    Basic Metabolic Panel:  Recent Labs Lab 05/16/15 0420 05/17/15 0406 05/20/15 0609 05/20/15 0821 05/21/15 0531 05/22/15 0555  NA 143 145 142  --  144 141  K 3.6 3.8 3.5  --  3.8 3.9  CL 112* 110 110  --  113* 111  CO2 21* 24 25  --  22 22  GLUCOSE 316* 167* 131*  --  93 133*  BUN 82* 78* 78* 73* 73* 70*  CREATININE 1.88* 1.81* 1.94* 1.94* 1.81* 1.93*  CALCIUM 8.2* 8.7* 8.1*  --  8.2* 7.8*    Liver Function Tests: No results for input(s): AST, ALT, ALKPHOS, BILITOT, PROT, ALBUMIN in the last 168 hours. No results for input(s): LIPASE, AMYLASE in the last 168 hours. No results for input(s): AMMONIA in the last 168 hours.  CBC:  Recent Labs Lab  05/16/15 0420 05/16/15 1803 05/17/15 0406 05/18/15 0617 05/20/15 0609 05/22/15 0555  WBC 12.9*  --  12.6* 10.1 10.8 7.7  HGB 7.3* 9.4* 9.6* 9.1* 8.8* 8.0*  HCT 22.9* 29.4* 30.6* 27.9* 27.5* 25.6*  MCV 84.0  --  84.2 83.8 85.6 85.5  PLT 487*  --  554* 468* 436 338    Cardiac Enzymes:  Recent Labs Lab 05/15/15 2046 05/15/15 2304 05/16/15 0250 05/17/15 1152 05/17/15 1734  TROPONINI 15.48* 18.60* 16.31* 3.95* 4.84*    BNP: Invalid input(s): POCBNP  CBG:  Recent Labs Lab 05/21/15 1116 05/21/15 1641 05/21/15 2238 05/22/15 0728 05/22/15 1147  GLUCAP 151* 89 109* 106* 63*    Microbiology: Results for orders placed or performed during the hospital encounter of 05/15/15  Blood culture (routine x 2)     Status: None   Collection Time: 05/15/15  4:11 PM  Result Value Ref Range Status   Specimen Description BLOOD RIGHT WRIST  Final   Special Requests   Final    BOTTLES DRAWN AEROBIC AND ANAEROBIC 1CC AEROBIC,1CCANA   Culture NO GROWTH 5 DAYS  Final   Report Status 05/20/2015 FINAL  Final  Blood culture (routine x 2)     Status: None   Collection Time: 05/15/15  4:20 PM  Result Value Ref Range Status   Specimen Description BLOOD LEFT HAND  Final   Special  Requests BOTTLES DRAWN AEROBIC AND ANAEROBIC 5CC  Final   Culture NO GROWTH 5 DAYS  Final   Report Status 05/20/2015 FINAL  Final  MRSA PCR Screening     Status: Abnormal   Collection Time: 05/15/15  8:57 PM  Result Value Ref Range Status   MRSA by PCR POSITIVE (A) NEGATIVE Final    Comment: READ BACK AND VERIFIED BY CASSIE STEWART @0028  05/16/15.Marland KitchenMarland KitchenAJO        The GeneXpert MRSA Assay (FDA approved for NASAL specimens only), is one component of a comprehensive MRSA colonization surveillance program. It is not intended to diagnose MRSA infection nor to guide or monitor treatment for MRSA infections.     Coagulation Studies:  Recent Labs  05/20/15 0821  LABPROT 20.7*  INR 1.78    Urinalysis: No  results for input(s): COLORURINE, LABSPEC, PHURINE, GLUCOSEU, HGBUR, BILIRUBINUR, KETONESUR, PROTEINUR, UROBILINOGEN, NITRITE, LEUKOCYTESUR in the last 72 hours.  Invalid input(s): APPERANCEUR    Imaging: No results found.   Medications:     . ALPRAZolam  0.5 mg Oral BID  . antiseptic oral rinse  7 mL Mouth Rinse BID  . aspirin EC  81 mg Oral Daily  . atorvastatin  40 mg Oral q1800  . cefTRIAXone (ROCEPHIN)  IV  1 g Intravenous Q24H  . furosemide  20 mg Oral Daily  . heparin subcutaneous  5,000 Units Subcutaneous 3 times per day  . insulin aspart  0-5 Units Subcutaneous QHS  . insulin aspart  0-9 Units Subcutaneous TID WC  . insulin detemir  25 Units Subcutaneous QHS  . ketoconazole  1 application Topical BID  . magnesium oxide  400 mg Oral Daily  . megestrol  200 mg Oral Daily  . mirtazapine  15 mg Oral QHS  . pantoprazole  40 mg Oral QAC breakfast  . sertraline  25 mg Oral Daily  . sodium chloride  3 mL Intravenous Q12H  . [START ON 05/23/2015] tamsulosin  0.4 mg Oral Daily   oxyCODONE, promethazine  Assessment/ Plan:  Shelley Fields is a 79 y.o. white female SNF resident with peripheral vascular disease, hypertension, diabetes mellitus type II Insulin dependent, anemia, hyperlipidemia, history of uterine cancer and bladder cancer with chronic foley catheter, who was admitted to [facility] on 05/15/2015  1. Acute renal failure on chronic kidney disease stage III: baseline creatinine range of 1.5-1.8. Presentation of creatinine of 2.88. Acute renal failure from acute coronary syndrome, acute cardiorenal syndrome from ischemic cardiomyopathy, and IV contrast exposure 05/14/15.  Chronic kidney disease secondary to vascular disease, hypertension and diabetes.  Urinalysis this admission shows LE 3+, protein, TNTC RBCs, TNTC WBCs - creatinine remains close to baseline however BUN remains quite high at 70.  The patient does appear to be in a catabolic state.  Continue to monitor renal  function closely.  She is at risk for worsening of her acute renal for later with any surgical procedure.  2. Hypertension: with acute exacerbation of congestive heart failure. Echocardiogram with diastolic dysfunction .  - blood pressure currently 124/35.  Patient now taken off of her antihypertensives.  3. Diabetes Mellitus type II Insulin dependent with chronic kidney disease:  hemoglobin A1c 7.4%  - Continue insulin regimen and glucose control.  4. Anemia of CKD: hgb 8, continue to trend, transfuse for hgb of 7 or less.    LOS: 7 Shelley Fields 12/14/20164:15 PM

## 2015-05-22 NOTE — Plan of Care (Signed)
Problem: Cardiac: Goal: Vascular access site(s) Level 0-1 will be maintained Pt oriented to person occasionally place. C/o pain Oxycodone given with Phenergan for nausea. Pt has rested quietly the rest of the night. Chronic foley in place. Family member called earlier for update.

## 2015-05-22 NOTE — Progress Notes (Signed)
Patient Demographics  Shelley Fields, is a 79 y.o. female   MRN: [id number]   DOB - 05/29/1936  Admit Date - 05/15/2015    Outpatient Primary MD for the patient is [name], MD  Consult requested in the Hospital by Vaughan Basta, MD, On 05/22/2015    With History of -  Past Medical History  Diagnosis Date  . Diabetes mellitus without complication ([place])   . C. difficile diarrhea   . Uterine cancer ([place])   . Hypogammaglobulinemia ([place])   . Pulmonary embolism ([place])   . Decubitus ulcers   . Hypertension   . Hypercholesterolemia   . Incontinence of urine   . Cancer [facility])     Endometrial Cancer  . Cervical cancer ([place])     with radiation 45 years ago...  . Arthritis   . GERD (gastroesophageal reflux disease)   . Bladder cancer ([place])   . Decubitus ulcer of buttock, stage 1   . Peripheral vascular disease ([place])   . Anemia       Past Surgical History  Procedure Laterality Date  . Total abdominal hysterectomy w/ bilateral salpingoophorectomy  07/29/2010  . Stent in leg  2016  . Cataract extraction, bilateral    . Bladder surgery    . Femoral endarterectomy      right  . Leg surgery      right leg  . Portacath placement      right  . Esophagogastroduodenoscopy N/A 12/14/2014    Procedure: ESOPHAGOGASTRODUODENOSCOPY (EGD);  Surgeon: Hulen Luster, MD;  Location: [facility] ENDOSCOPY;  Service: Endoscopy;  Laterality: N/A;  . Peripheral vascular catheterization Right 04/05/2015    Procedure: Lower Extremity Angiography;  Surgeon: Katha Cabal, MD;  Location: [place] CV LAB;  Service: Cardiovascular;  Laterality: Right;  . Peripheral vascular catheterization  04/05/2015    Procedure: Lower Extremity Intervention;  Surgeon: Katha Cabal, MD;  Location: [place] CV LAB;  Service:  Cardiovascular;;  . Peripheral vascular catheterization Right 05/14/2015    Procedure: Lower Extremity Angiography;  Surgeon: Katha Cabal, MD;  Location: [place] CV LAB;  Service: Cardiovascular;  Laterality: Right;  . Peripheral vascular catheterization Right 05/14/2015    Procedure: Lower Extremity Intervention;  Surgeon: Katha Cabal, MD;  Location: [place] CV LAB;  Service: Cardiovascular;  Laterality: Right;  . Angioplasty      in for   Chief Complaint  Patient presents with  . Shortness of Breath     HPI  Shelley Fields  is a 79 y.o. female, seen today and followed up because of gangrene to the right foot  Review of Systems    In addition to the HPI above,  No Fever-chills, No Headache, No changes with Vision or hearing, No problems swallowing food or Liquids, No Chest pain, Cough or Shortness of Breath, No Abdominal pain, No Nausea or Vommitting, Bowel movements are regular, No Blood in stool or Urine, No dysuria, No new skin rashes or bruises, No new joints pains-aches,  No new weakness, tingling, numbness in any extremity, No recent weight gain or loss, No polyuria, polydypsia or polyphagia, No significant Mental Stressors.  A full 10 point  Review of Systems was done, except as stated above, all other Review of Systems were negative.   Social History Social History  Substance Use Topics  . Smoking status: Never Smoker   . Smokeless tobacco: Not on file  . Alcohol Use: No     Family History Family History  Problem Relation Age of Onset  . Hypertension Mother   . Diabetes Mellitus II Mother   . Coronary artery disease Mother   . Hypertension Father   . Diabetes Mellitus II Father   . CAD Father      Prior to Admission medications   Medication Sig Start Date End Date Taking? Authorizing Provider  amLODipine (NORVASC) 5 MG tablet TAKE 1 TABLET BY MOUTH EVERY DAY 02/12/15   Arlis Porta., MD  aspirin 81 MG tablet Take 81 mg by  mouth daily.    Historical Provider, MD  atenolol (TENORMIN) 50 MG tablet Take 50 mg by mouth daily. 10/24/14   Historical Provider, MD  benazepril (LOTENSIN) 40 MG tablet Take 40 mg by mouth daily. 11/01/14   Historical Provider, MD  cloNIDine (CATAPRES) 0.2 MG tablet Take 0.2 mg by mouth 2 (two) times daily. 10/24/14   Historical Provider, MD  furosemide (LASIX) 20 MG tablet Take 20 mg by mouth every other day. 10/24/14   Historical Provider, MD  HUMULIN 70/30 (70-30) 100 UNIT/ML injection 16 Units daily at 6 PM. 11/01/14   Historical Provider, MD  hydrALAZINE (APRESOLINE) 100 MG tablet Take 100 mg by mouth 2 (two) times daily. 10/24/14   Historical Provider, MD  insulin aspart (NOVOLOG) cartridge Inject into the skin 3 (three) times daily with meals.    Historical Provider, MD  ketoconazole (NIZORAL) 2 % cream Apply 1 application topically 2 (two) times daily.    Historical Provider, MD  lovastatin (MEVACOR) 20 MG tablet TAKE 1 TABLET BY MOUTH AT BEDTIME 01/28/15   Arlis Porta., MD  magnesium oxide (MAG-OX) 400 MG tablet Take 400 mg by mouth daily.    Historical Provider, MD  meclizine (ANTIVERT) 25 MG tablet Take 25 mg by mouth daily. 10/24/14   Historical Provider, MD  megestrol (MEGACE ES) 625 MG/5ML suspension Take 1.6 mLs (200 mg total) by mouth daily. to stimulate appetite 11/23/14   Lequita Asal, MD  mirtazapine (REMERON) 15 MG tablet Take 15 mg by mouth at bedtime.  10/24/14   Historical Provider, MD  MORPHINE SULFATE PO Take 5 mg by mouth every 3 (three) hours as needed.    Historical Provider, MD  mupirocin ointment (BACTROBAN) 2 %  01/14/15   Historical Provider, MD  nystatin cream (MYCOSTATIN) Apply 1 application topically 2 (two) times daily. Apply to buttocks    Historical Provider, MD  omeprazole (PRILOSEC) 20 MG capsule Take 20 mg by mouth every other day.  08/24/14   Historical Provider, MD  oxyCODONE (ROXICODONE) 15 MG immediate release tablet Take 15 mg by mouth every 4 (four)  hours as needed for pain.    Historical Provider, MD  potassium chloride (K-DUR) 10 MEQ tablet Take 1 tablet by mouth daily. 12/03/14   Historical Provider, MD  promethazine (PHENERGAN) 25 MG tablet Take 25 mg by mouth every 8 (eight) hours as needed for nausea or vomiting.    Historical Provider, MD  SANTYL ointment  10/24/14   Historical Provider, MD  tamsulosin (FLOMAX) 0.4 MG CAPS capsule Take 0.4 mg by mouth 2 (two) times daily. 10/24/14   Historical Provider, MD  VITAMIN K,  PHYTONADIONE, PO Take 1.25 mg by mouth 2 (two) times a week. Mon and friday    Historical Provider, MD  warfarin (COUMADIN) 1 MG tablet Take 1.5 mg by mouth daily.    Historical Provider, MD    Anti-infectives    Start     Dose/Rate Route Frequency Ordered Stop   05/19/15 0945  cefTRIAXone (ROCEPHIN) 1 g in dextrose 5 % 50 mL IVPB     1 g 100 mL/hr over 30 Minutes Intravenous Every 24 hours 05/19/15 0935 05/24/15 0944   05/15/15 1715  piperacillin-tazobactam (ZOSYN) IVPB 3.375 g     3.375 g 12.5 mL/hr over 240 Minutes Intravenous  Once 05/15/15 1703 05/15/15 2130      Scheduled Meds: . ALPRAZolam  0.5 mg Oral BID  . antiseptic oral rinse  7 mL Mouth Rinse BID  . aspirin EC  81 mg Oral Daily  . atorvastatin  40 mg Oral q1800  . cefTRIAXone (ROCEPHIN)  IV  1 g Intravenous Q24H  . furosemide  20 mg Oral Daily  . heparin subcutaneous  5,000 Units Subcutaneous 3 times per day  . insulin aspart  0-5 Units Subcutaneous QHS  . insulin aspart  0-9 Units Subcutaneous TID WC  . insulin detemir  25 Units Subcutaneous QHS  . ketoconazole  1 application Topical BID  . magnesium oxide  400 mg Oral Daily  . megestrol  200 mg Oral Daily  . mirtazapine  15 mg Oral QHS  . pantoprazole  40 mg Oral QAC breakfast  . sertraline  25 mg Oral Daily  . sodium chloride  3 mL Intravenous Q12H  . [START ON 05/23/2015] tamsulosin  0.4 mg Oral Daily   Continuous Infusions:  PRN Meds:.oxyCODONE, promethazine  Allergies  Allergen  Reactions  . Contrast Media [Iodinated Diagnostic Agents]     Physical Exam  Vitals  Blood pressure 124/35, pulse 60, temperature 98.4 F (36.9 C), temperature source Oral, resp. rate 18, height 5' (1.524 m), weight 69.264 kg (152 lb 11.2 oz), SpO2 98 %.  Lower Extremity exam:  Vascular: Right foot shows no palpable pulses there is significant vascular compromise with gangrene to the digits and metatarsal head areas to the right foot  Dermatological: Gangrene as noted above. There is a lot of demarcation both dorsally and plantarly but the lateral demarcation is erythematous on the dorsal aspect particular. An irregular lateral demarcation is noted plantarl  Ortho: Gangrene extends at least to the mid metatarsal area on the right foot. This is on the dorsal and plantar aspects of the foot. A little proximal to that on the medial lateral sides extending to the proximal metatarsal areas of the first and fifth metatarsals.  Data Review  CBC  Recent Labs Lab 05/16/15 0420 05/16/15 1803 05/17/15 0406 05/18/15 0617 05/20/15 0609 05/22/15 0555  WBC 12.9*  --  12.6* 10.1 10.8 7.7  HGB 7.3* 9.4* 9.6* 9.1* 8.8* 8.0*  HCT 22.9* 29.4* 30.6* 27.9* 27.5* 25.6*  PLT 487*  --  554* 468* 436 338  MCV 84.0  --  84.2 83.8 85.6 85.5  MCH 26.8  --  26.4 27.5 27.6 26.9  MCHC 31.9*  --  31.4* 32.8 32.2 31.5*  RDW 18.3*  --  17.7* 17.8* 18.2* 18.3*   ------------------------------------------------------------------------------------------------------------------  Chemistries   Recent Labs Lab 05/16/15 0420 05/17/15 0406 05/20/15 0609 05/20/15 0821 05/21/15 0531 05/22/15 0555  NA 143 145 142  --  144 141  K 3.6 3.8 3.5  --  3.8  3.9  CL 112* 110 110  --  113* 111  CO2 21* 24 25  --  22 22  GLUCOSE 316* 167* 131*  --  93 133*  BUN 82* 78* 78* 73* 73* 70*  CREATININE 1.88* 1.81* 1.94* 1.94* 1.81* 1.93*  CALCIUM 8.2* 8.7* 8.1*  --  8.2* 7.8*    ------------------------------------------------------------------------------------------------------------------ estimated creatinine clearance is 21.2 mL/min (by C-G formula based on Cr of 1.93). ------------------------------------------------------------------------------------------------------------------ No results for input(s): TSH, T4TOTAL, T3FREE, THYROIDAB in the last 72 hours.  Invalid input(s): FREET3   Coagulation profile  Recent Labs Lab 05/20/15 0821  INR 1.78   ------------------------------------------------------------------------------------------------------------------- No results for input(s): DDIMER in the last 72 hours. -------------------------------------------------------------------------------------------------------------------  Cardiac Enzymes  Recent Labs Lab 05/16/15 0250 05/17/15 1152 05/17/15 1734  TROPONINI 16.31* 3.95* 4.84*   ------------------------------------------------------------------------------------------------------------------ Invalid input(s): POCBNP   ---------------------------------------------------------------------------------------------------------------  Urinalysis    Component Value Date/Time   COLORURINE YELLOW* 05/17/2015 1052   COLORURINE Straw 10/03/2013 0850   APPEARANCEUR HAZY* 05/17/2015 1052   APPEARANCEUR Hazy 10/03/2013 0850   LABSPEC 1.012 05/17/2015 1052   LABSPEC 1.008 10/03/2013 0850   PHURINE 5.0 05/17/2015 1052   PHURINE 7.0 10/03/2013 0850   GLUCOSEU 50* 05/17/2015 1052   GLUCOSEU 50 mg/dL 10/03/2013 0850   HGBUR 2+* 05/17/2015 1052   HGBUR Negative 10/03/2013 0850   BILIRUBINUR NEGATIVE 05/17/2015 1052   BILIRUBINUR Negative 10/03/2013 0850   KETONESUR NEGATIVE 05/17/2015 1052   KETONESUR Negative 10/03/2013 0850   PROTEINUR 100* 05/17/2015 1052   PROTEINUR 100 mg/dL 10/03/2013 0850   NITRITE NEGATIVE 05/17/2015 1052   NITRITE Negative 10/03/2013 0850   LEUKOCYTESUR 3+*  05/17/2015 1052   LEUKOCYTESUR 3+ 10/03/2013 0850     Assessment & Plan: Severe gangrene to the right foot. I have concerns about any level of healing at the tarsal metatarsal region if a transmetatarsal amputation were performed. There may be enough viable tissue to close the area if a total metatarsal amputation at the metatarsal tarsal junction were performed. Preferably its helpful to leave the insertional areas of the peroneus brevis and the tibialis anterior tendon attached but unsure that level will be attainable. Possibly could leave the proximal portions of the metatarsals and maybe get closure but still uncertain as to the viability of the tissues and skin at that level. Greater than 50% chance I would say of nonhealing even with wound VAC application to the incision area. More predictable approach to be a below-knee amputation. If family wants to try a partial foot amputation we can discuss it and consider it. I called the patient's daughter last night and did not receive an answer. A left mild personal phone number to call back and did not receive a return call. I will try again this evening to reach the patient's daughter. I will also discuss this further with Dr. Ronalee Belts.    Perry Mount M.D on 05/22/2015 at 6:10 PM

## 2015-05-22 NOTE — Progress Notes (Signed)
[facility] [place] NAME: Shelley Fields    MR#:  [id number]  DATE OF BIRTH:  1935-10-19  SUBJECTIVE:  CHIEF COMPLAINT:  Patient is resting comfortably. Initially admitted with SOB and found to have NSTEMI- with some renal insufficiency, given heparin drip. Had vascular procedure twice in last 3 months to salvage her foot on right side, Right  foot is turning black. No pain. Off heparin drip. Distal half foot is completely black and she have some pain in there. Pt appears agitated and somewhat depressed today. REVIEW OF SYSTEMS:  CONSTITUTIONAL: No fever, fatigue or weakness.  EYES: No blurred or double vision.  EARS, NOSE, AND THROAT: No tinnitus or ear pain.  RESPIRATORY: No cough, shortness of breath, wheezing or hemoptysis.  CARDIOVASCULAR: No chest pain, orthopnea, edema.  GASTROINTESTINAL: No nausea, vomiting, diarrhea or abdominal pain.  GENITOURINARY: No dysuria, hematuria.  ENDOCRINE: No polyuria, nocturia,  HEMATOLOGY: No anemia, easy bruising or bleeding SKIN: No rash or lesion. MUSCULOSKELETAL: No joint pain or arthritis. Has chronic peripheral vascular disease and had angioplasty for right foot gangrene recently - pain in foot and leg. NEUROLOGIC: No tingling, numbness, weakness.  PSYCHIATRY: appears somewhat agitated and depresed.   DRUG ALLERGIES:   Allergies  Allergen Reactions  . Contrast Media [Iodinated Diagnostic Agents]     VITALS:  Blood pressure 122/61, pulse 58, temperature 98.2 F (36.8 C), temperature source Oral, resp. rate 18, height 5' (1.524 m), weight 69.264 kg (152 lb 11.2 oz), SpO2 100 %.  PHYSICAL EXAMINATION:  GENERAL:  79 y.o.-year-old patient lying in the bed with no acute distress.  EYES: Pupils equal, round, reactive to light and accommodation. No scleral icterus. Extraocular muscles intact.  HEENT: Head atraumatic, normocephalic. Oropharynx and nasopharynx clear.  NECK:  Supple, no jugular  venous distention. No thyroid enlargement, no tenderness.  LUNGS: Normal breath sounds bilaterally, no wheezing, rales,rhonchi or crepitation. No use of accessory muscles of respiration.  CARDIOVASCULAR: S1, S2 normal. No murmurs, rubs, or gallops.  ABDOMEN: Soft, nontender, nondistended. Bowel sounds present. No organomegaly or mass.  EXTREMITIES: No pedal edema, . Diminished peripheral pulses. Right distal foot is turning black on toes, and not able to palpate pulse on right dorsalis pedis artery. NEUROLOGIC: Cranial nerves II through XII are intact. Muscle strength 5/5 in all extremities. Sensation intact. Gait not checked.  PSYCHIATRIC: The patient is alert and oriented x 3. Somewhat depressed. SKIN: No obvious rash, lesion, or ulcer.    LABORATORY PANEL:   CBC  Recent Labs Lab 05/22/15 0555  WBC 7.7  HGB 8.0*  HCT 25.6*  PLT 338   ------------------------------------------------------------------------------------------------------------------  Chemistries   Recent Labs Lab 05/22/15 0555  NA 141  K 3.9  CL 111  CO2 22  GLUCOSE 133*  BUN 70*  CREATININE 1.93*  CALCIUM 7.8*   ------------------------------------------------------------------------------------------------------------------  Cardiac Enzymes  Recent Labs Lab 05/17/15 1734  TROPONINI 4.84*   ------------------------------------------------------------------------------------------------------------------  RADIOLOGY:  No results found.  EKG:   Orders placed or performed during the hospital encounter of 05/15/15  . ED EKG  . ED EKG    ASSESSMENT AND PLAN:   * Non-ST elevation MI Patient is currently asymptomatic ,  given Heparin IV drip for 48-72 hours and then stopped. Cardiology is defering   Cardiac catheterization  Continue Aspirin, statin Reviewed echo.  Discontinue telemetry.stable.  * Acute on chronic Congestive heart failure EF 55%- so it was diastolic Heart failure.   Continued IV Lasix -  switched to oral. Intake and output measurement    Had SOB on presentation, now on room air.  *Anemia with hemoglobin 7.3 in the setting of non-STEMI Status post  1 unit of blood transfusion - stable Hb now.  * Acute on chronic renal failure Baseline creatinine was around 1.3 for 5 month ago, now it is more than 1.8 Patient is on IV Lasix for acute on chronic CHF,  monitor renal function,: Nephrology consult is appreciated. Hold ACE inhibitor and try to maintain the blood pressure.  stable renal func now.  * History of pulmonary embolism Her INR was subtherapeutic , held Coumadin for now  Patient was on heparin drip for non-STEMI - now stopped.   Will wait for vascular- as there is plan for surgery.   I discussed with her daughter also- she does not know- when PE was there.   I checked PMD's notes in old chart- as per him even in march 2016- PE is in past history. I did not find any CT in out records showing PE.   Or Coumadin may have been continued for PVD- in that case , I will leave it up to vascular to resume after possible surgery or not.    Otherwise- for past PE- she don't need to be on that now.  * Hyperglycemia with diabetes given 10 units of Levemir - increased to 30 due to hyperglycemia.  on sliding scale coverage.   Had hypoglycemia on 05/21/15 morning- decreased to 25 units levemir.   Now Blood sugar stable.  * Hypertension She is on  amlodipine, atenolol and Lasix .   BP runs on lower so stopped atenolol, clonidine.  * gangene on foot- PVD   S/p vascular procedure before admission. Total twice in last 3 months.    Appreciated vascular and podiatry.   Waiting for final decision about surgery.  All the records are reviewed and case discussed with Care Management/Social Workerr. Management plans discussed with the patient, family and they are in agreement.  CODE STATUS: DO NOT RESUSCITATE  TOTAL  TIME TAKING CARE OF THIS PATIENT: 35  minutes.   POSSIBLE D/C IN 2-3 DAYS, DEPENDING ON CLINICAL CONDITION.  Vaughan Basta M.D on 05/22/2015 at 9:31 PM  Between 7am to 6pm - Pager - [phone] After 6pm go to [email or web address] - password [email or web address] [facility]  Office  [phone]  CC: Primary care physician; [name], MD

## 2015-05-23 LAB — GLUCOSE, CAPILLARY
GLUCOSE-CAPILLARY: 53 mg/dL — AB (ref 65–99)
GLUCOSE-CAPILLARY: 88 mg/dL (ref 65–99)
GLUCOSE-CAPILLARY: 105 mg/dL — AB (ref 65–99)
GLUCOSE-CAPILLARY: 207 mg/dL — AB (ref 65–99)
Glucose-Capillary: 74 mg/dL (ref 65–99)
Glucose-Capillary: 169 mg/dL — ABNORMAL HIGH (ref 65–99)
Glucose-Capillary: 150 mg/dL — ABNORMAL HIGH (ref 65–99)

## 2015-05-23 LAB — BASIC METABOLIC PANEL
ANION GAP: 11 (ref 5–15)
BUN: 68 mg/dL — ABNORMAL HIGH (ref 6–20)
CHLORIDE: 110 mmol/L (ref 101–111)
CO2: 22 mmol/L (ref 22–32)
Calcium: 8.3 mg/dL — ABNORMAL LOW (ref 8.9–10.3)
Creatinine, Ser: 1.72 mg/dL — ABNORMAL HIGH (ref 0.44–1.00)
GFR calc Af Amer: 32 mL/min — ABNORMAL LOW (ref >60)
GFR, EST NON AFRICAN AMERICAN: 27 mL/min — AB (ref >60)
Glucose, Bld: 44 mg/dL — CL (ref 65–99)
POTASSIUM: 3.5 mmol/L (ref 3.5–5.1)
SODIUM: 143 mmol/L (ref 135–145)

## 2015-05-23 MED ORDER — RISPERIDONE 0.5 MG PO TABS
0.2500 mg | ORAL_TABLET | Freq: Every day | ORAL | Status: DC
  Administered 2015-05-23: 0.25 mg via ORAL
  Filled 2015-05-23: qty 2

## 2015-05-23 MED ORDER — MORPHINE SULFATE ER 15 MG PO TBCR
30.0000 mg | EXTENDED_RELEASE_TABLET | Freq: Two times a day (BID) | ORAL | Status: DC
  Administered 2015-05-23: 30 mg via ORAL
  Filled 2015-05-23 (×2): qty 2

## 2015-05-23 MED ORDER — FLUCONAZOLE 100 MG PO TABS
100.0000 mg | ORAL_TABLET | Freq: Every day | ORAL | Status: DC
  Administered 2015-05-23 – 2015-05-24 (×2): 100 mg via ORAL
  Filled 2015-05-23 (×2): qty 1

## 2015-05-23 MED ORDER — INSULIN DETEMIR 100 UNIT/ML ~~LOC~~ SOLN
20.0000 [IU] | Freq: Every day | SUBCUTANEOUS | Status: DC
  Filled 2015-05-23: qty 0.2

## 2015-05-23 MED ORDER — DOCUSATE SODIUM 100 MG PO CAPS
100.0000 mg | ORAL_CAPSULE | Freq: Two times a day (BID) | ORAL | Status: DC
  Administered 2015-05-23 – 2015-05-24 (×2): 100 mg via ORAL
  Filled 2015-05-23 (×2): qty 1

## 2015-05-23 MED ORDER — NYSTATIN 100000 UNIT/GM EX POWD
Freq: Two times a day (BID) | CUTANEOUS | Status: DC
Start: 2015-05-23 — End: 2015-05-24
  Administered 2015-05-23 – 2015-05-24 (×3): via TOPICAL
  Filled 2015-05-23: qty 15

## 2015-05-23 MED ORDER — INSULIN DETEMIR 100 UNIT/ML ~~LOC~~ SOLN
15.0000 [IU] | Freq: Every day | SUBCUTANEOUS | Status: DC
  Administered 2015-05-23: 15 [IU] via SUBCUTANEOUS
  Filled 2015-05-23 (×2): qty 0.15

## 2015-05-23 NOTE — Progress Notes (Addendum)
Lab notified that patient's BS 44. Nurse rechecked 53. Staff notified Dr Marcille Blanco. Gave patient juice and applesauce with some sugar. BS rose to 74. Gave some more applesauce. Patient was alert during hypoglycemic episode. Her skin was slightly colder but no diaphoresis.

## 2015-05-23 NOTE — Care Management Important Message (Signed)
Important Message  Patient Details  Name: ANGELI MARINE MRN: [id number] Date of Birth: May 21, 1936   Medicare Important Message Given:  Yes    Juliann Pulse A Owais Pruett 05/23/2015, 10:45 AM

## 2015-05-23 NOTE — Progress Notes (Signed)
Inpatient Diabetes Program Recommendations  AACE/ADA: New Consensus Statement on Inpatient Glycemic Control (2015)  Target Ranges:  Prepandial:   less than 140 mg/dL      Peak postprandial:   less than 180 mg/dL (1-2 hours)      Critically ill patients:  140 - 180 mg/dL   Review of Glycemic Control  Results for TENAY, MALZ (MRN [id number]) as of 05/23/2015 09:14  Ref. Range 05/22/2015 16:41 05/22/2015 21:07 05/23/2015 05:52 05/23/2015 06:08 05/23/2015 07:42  Glucose-Capillary Latest Ref Range: 65-99 mg/dL 135 (H) 151 (H) 53 (L) 74 88   Diabetes history: DM2 Outpatient Diabetes medications: 70/30 16 units Q6PM, Novolog TID (sliding scale) Current orders for Inpatient glycemic control: Levemir 20 units QHS, Novolog 0-9 units TID with meals, Novolog 0-5 qhs  Inpatient Diabetes Program Recommendations:  Since the patient is no longer taking steroids and based on her home dose of 70/30 (11 units of basal at home) and A1C of 7.4%, consider decreasing Levemir insulin to 15 units qhs to avoid hypoglycemia.   Gentry Fitz, RN, BA, MHA, CDE Diabetes Coordinator Inpatient Diabetes Program  [phone] (Team Pager) [phone] ([place]) 05/23/2015 9:19 AM

## 2015-05-23 NOTE — Plan of Care (Signed)
Problem: Education: Goal: Knowledge of [facility] Education information/materials will improve Outcome: Not Met (add Reason) Pt is forgetful and has poor judgement.

## 2015-05-23 NOTE — Care Management (Signed)
Case discussed with Dr Delana Meyer. MD has not been able to get in contact with POA  Shelley Fields concerning plan for operative intervention. Patient would be a candidate for hospice and could go back to [facility] with Hospice. Patient has been open prior with [facility]. Discussed with Shelley Fields liaison for [facility] . Can go back to [facility] as long as not skilled. Contacted Shelley Fields at [phone]. Discussed the case ind informed her that Dr Delana Meyer was attempting to contact her. She stated that she would be available to speak with dr Delana Meyer this morning that she works night. Discussed Hospice and Ms Fields stated that she would like her mother to return to [facility] with Hospice. She stated that she has spoken with Katharine Look at [facility] concerning this as well. More to follow.

## 2015-05-23 NOTE — Care Management (Signed)
Paged Dr Delana Meyer and informed that I had spoke to the daughter Kalman Shan. Gave dr Delana Meyer daughters cell number. MD stated he would contact her to discuss plan.

## 2015-05-23 NOTE — Progress Notes (Signed)
[facility]  Daily Progress Note   Subjective: Patient confused at baseline. Calling out for mother and father. Still awaiting final decision from daughter in regard to hospice vs right BKA. If BKA, Dr. Delana Meyer will most likely schedule it Wed 05/29/15.  Objective: Filed Vitals:   05/22/15 0829 05/22/15 0831 05/22/15 1259 05/22/15 2107  BP: 116/43 118/38 124/35 122/61  Pulse: 62 58 60 58  Temp:   98.4 F (36.9 C) 98.2 F (36.8 C)  TempSrc:   Oral Oral  Resp:      Height:      Weight:      SpO2: 99% 98% 98% 100%    Intake/Output Summary (Last 24 hours) at 05/23/15 1429 Last data filed at 05/23/15 1036  Gross per 24 hour  Intake     63 ml  Output    500 ml  Net   -437 ml    Physical Exam: Confused, NAD CV: RRR Pulmonary: CTA Bilaterally Abdomen: Soft, Nontender, Nondistended Vascular: Right Extremity: Foot - dry gangrene proximally to about mid foot. No cellulitis or infection noted. No pulses palpable, pitting edema to ankle.    Laboratory: CBC    Component Value Date/Time   WBC 7.7 05/22/2015 0555   WBC 9.7 10/03/2014 1030   HGB 8.0* 05/22/2015 0555   HGB 9.5* 10/03/2014 1030   HCT 25.6* 05/22/2015 0555   HCT 30.7* 10/03/2014 1030   PLT 338 05/22/2015 0555   PLT 325 10/03/2014 1030   BMET    Component Value Date/Time   NA 143 05/23/2015 0404   NA 136 07/13/2014 1022   K 3.5 05/23/2015 0404   K 4.8 07/13/2014 1022   CL 110 05/23/2015 0404   CL 101 07/13/2014 1022   CO2 22 05/23/2015 0404   CO2 26 07/13/2014 1022   GLUCOSE 44* 05/23/2015 0404   GLUCOSE 218* 07/13/2014 1022   BUN 68* 05/23/2015 0404   BUN 27* 09/04/2014 1113   CREATININE 1.72* 05/23/2015 0404   CREATININE 1.20* 10/03/2014 1030   CALCIUM 8.3* 05/23/2015 0404   CALCIUM 8.2* 07/13/2014 1022   GFRNONAA 27* 05/23/2015 0404   GFRNONAA 44* 10/03/2014 1030   GFRNONAA 31* 07/13/2014 1022   GFRAA 32* 05/23/2015 0404   GFRAA 50* 10/03/2014 1030   GFRAA 67*  07/13/2014 1022   Assessment/Planning: 79 year old female with dry gangrene of the right foot  1) Awaiting final decision from daughter in regard to hospice vs right BKA 2) Would not recommend angiogram for the third time. 3) Continue care as per primary team 4) Discussed with Dr. Eber Hong Carliss Porcaro PA-C 05/23/2015 2:29 PM

## 2015-05-23 NOTE — Progress Notes (Signed)
[facility] NAME: Shelley Fields    MR#:  [id number]  DATE OF BIRTH:  Mar 17, 1936  SUBJECTIVE:  CHIEF COMPLAINT:  Patient is resting comfortably. Initially admitted with SOB and found to have NSTEMI- with some renal insufficiency, given heparin drip. Had vascular procedure twice in last 3 months to salvage her foot on right side, Right  foot is turning black. No pain. Off heparin drip. Distal half foot is completely black and she have some pain in there. Pt appears agitated and confused.  REVIEW OF SYSTEMS:   Confused , not able to give me any history.  DRUG ALLERGIES:   Allergies  Allergen Reactions  . Contrast Media [Iodinated Diagnostic Agents]     VITALS:  Blood pressure 118/56, pulse 62, temperature 98.7 F (37.1 C), temperature source Oral, resp. rate 18, height 5' (1.524 m), weight 69.264 kg (152 lb 11.2 oz), SpO2 100 %.  PHYSICAL EXAMINATION:  GENERAL:  79 y.o.-year-old patient lying in the bed with no acute distress.  EYES: Pupils equal, round, reactive to light and accommodation. No scleral icterus. Extraocular muscles intact.  HEENT: Head atraumatic, normocephalic. Oropharynx and nasopharynx clear.  NECK:  Supple, no jugular venous distention. No thyroid enlargement, no tenderness.  LUNGS: Normal breath sounds bilaterally, no wheezing, rales,rhonchi or crepitation. No use of accessory muscles of respiration.  CARDIOVASCULAR: S1, S2 normal. No murmurs, rubs, or gallops.  ABDOMEN: Soft, nontender, nondistended. Bowel sounds present. No organomegaly or mass.  EXTREMITIES: No pedal edema, . Diminished peripheral pulses. Right distal foot is turning black on toes, and not able to palpate pulse on right dorsalis pedis artery. NEUROLOGIC: Cranial nerves II through XII are intact. Muscle strength 4/5 in all extremities. Sensation intact. Gait not checked.  PSYCHIATRIC: The patient is alert and agitated with  disoriented. SKIN: No obvious rash, lesion, or ulcer.    LABORATORY PANEL:   CBC  Recent Labs Lab 05/22/15 0555  WBC 7.7  HGB 8.0*  HCT 25.6*  PLT 338   ------------------------------------------------------------------------------------------------------------------  Chemistries   Recent Labs Lab 05/23/15 0404  NA 143  K 3.5  CL 110  CO2 22  GLUCOSE 44*  BUN 68*  CREATININE 1.72*  CALCIUM 8.3*   ------------------------------------------------------------------------------------------------------------------  Cardiac Enzymes  Recent Labs Lab 05/17/15 1734  TROPONINI 4.84*   ------------------------------------------------------------------------------------------------------------------  RADIOLOGY:  No results found.   ASSESSMENT AND PLAN:   * Non-ST elevation MI Patient is currently asymptomatic ,  given Heparin IV drip for 48-72 hours and then stopped. Cardiology is defering   Cardiac catheterization  Continue Aspirin, statin Reviewed echo.  Discontinue telemetry.stable.  * Acute on chronic Congestive heart failure EF 55%- so it was diastolic Heart failure.  Continued IV Lasix - switched to oral. Intake and output measurement    Had SOB on presentation, now on room air.  *Anemia with hemoglobin 7.3 in the setting of non-STEMI Status post  1 unit of blood transfusion - stable Hb now.   Gradual drop from > 9 to 8- monitor.  * Acute on chronic renal failure Baseline creatinine was around 1.3 for 5 month ago, now it is more than 1.8 Patient is on IV Lasix for acute on chronic CHF,  monitor renal function,: Nephrology consult is appreciated. Hold ACE inhibitor and try to maintain the blood pressure.  stable renal func now.  * History of pulmonary embolism Her INR was subtherapeutic , held Coumadin for now  Patient was on heparin drip  for non-STEMI - now stopped.   Will wait for vascular- as there is plan for surgery.   I discussed with  her daughter also- she does not know- when PE was there.   I checked PMD's notes in old chart- as per him even in march 2016- PE is in past history. I did not find any CT in out records showing PE.   Or Coumadin may have been continued for PVD- in that case , I will leave it up to vascular to resume after possible surgery or not.    Otherwise- for past PE- she don't need to be on that now.   Currently keep on heparin [place] till surgery.  * Hyperglycemia with diabetes given 10 units of Levemir - increased to 30 due to hyperglycemia.  on sliding scale coverage.   Had hypoglycemia on 05/21/15 morning- decreased to 25 units levemir.   Again Hypo on 05/23/15- so decreased to 15 units Q HS.  * Hypertension She is on  amlodipine, atenolol and Lasix .   BP runs on lower so stopped atenolol, clonidine.  * gangene on foot- PVD   S/p vascular procedure before admission. Total twice in last 3 months.    Appreciated vascular and podiatry.    Spoke to Dr. Carroll Kinds- he will discuss with her daughter today- and plan for surgery possibly next week- pt can be brought from [facility] for sx as per him.  * yeast infection in groin.   Nystatin powder and fluconazol oral.  * Confusion and agitation   Likely delirium as a part of dementia   Oral xanax and risperidone.     All the records are reviewed and case discussed with Care Management/Social Workerr. Management plans discussed with the patient, family and they are in agreement.  CODE STATUS: DO NOT RESUSCITATE  TOTAL  TIME TAKING CARE OF THIS PATIENT: 35 minutes.  Discussed with her daughter and Dr. Carroll Kinds.  POSSIBLE D/C IN 1-2 DAYS, DEPENDING ON CLINICAL CONDITION.  Vaughan Basta M.D on 05/23/2015 at 4:47 PM  Between 7am to 6pm - Pager - [phone] After 6pm go to [email or web address] - password [email or web address] [facility]  Office  [phone]  CC: Primary care physician; [name], MD

## 2015-05-23 NOTE — Progress Notes (Signed)
[facility]  ROUNDING NOTE   Subjective:  Creatinine currently down to 1.72. Patient's daughter to be driving down from [name] in the relatively near future. Definitive decision regarding amputation not yet made.   Objective:  Vital signs in last 24 hours:  Temp:  [98.2 F (36.8 C)-98.4 F (36.9 C)] 98.2 F (36.8 C) (12/14 2107) Pulse Rate:  [58-60] 58 (12/14 2107) BP: (122-124)/(35-61) 122/61 mmHg (12/14 2107) SpO2:  [98 %-100 %] 100 % (12/14 2107)  Weight change:  Filed Weights   05/15/15 1553 05/15/15 2042  Weight: 75.5 kg (166 lb 7.2 oz) 69.264 kg (152 lb 11.2 oz)    Intake/Output: I/O last 3 completed shifts: In: 172 [P.O.:119; I.V.:3; IV Piggyback:50] Out: 725 [Urine:725]   Intake/Output this shift:  Total I/O In: -  Out: 350 [Urine:350]  Physical Exam: General: NAD  Head: Normocephalic, atraumatic. Moist oral mucosal membranes  Eyes: Anicteric  Neck: Supple, trachea midline  Lungs:  Clear to auscultation normal effort  Heart: Regular rate and rhythm  Abdomen:  Soft, nontender, BS present  Extremities:  no peripheral edema. right foot wrapped in gauze  Neurologic: Nonfocal, moving all four extremities  Skin: No lesions  GU Chronic indwelling foely    Basic Metabolic Panel:  Recent Labs Lab 05/17/15 0406 05/20/15 0609 05/20/15 0821 05/21/15 0531 05/22/15 0555 05/23/15 0404  NA 145 142  --  144 141 143  K 3.8 3.5  --  3.8 3.9 3.5  CL 110 110  --  113* 111 110  CO2 24 25  --  22 22 22   GLUCOSE 167* 131*  --  93 133* 44*  BUN 78* 78* 73* 73* 70* 68*  CREATININE 1.81* 1.94* 1.94* 1.81* 1.93* 1.72*  CALCIUM 8.7* 8.1*  --  8.2* 7.8* 8.3*    Liver Function Tests: No results for input(s): AST, ALT, ALKPHOS, BILITOT, PROT, ALBUMIN in the last 168 hours. No results for input(s): LIPASE, AMYLASE in the last 168 hours. No results for input(s): AMMONIA in the last 168 hours.  CBC:  Recent Labs Lab 05/16/15 1803 05/17/15 0406  05/18/15 0617 05/20/15 0609 05/22/15 0555  WBC  --  12.6* 10.1 10.8 7.7  HGB 9.4* 9.6* 9.1* 8.8* 8.0*  HCT 29.4* 30.6* 27.9* 27.5* 25.6*  MCV  --  84.2 83.8 85.6 85.5  PLT  --  554* 468* 436 338    Cardiac Enzymes:  Recent Labs Lab 05/17/15 1152 05/17/15 1734  TROPONINI 3.95* 4.84*    BNP: Invalid input(s): POCBNP  CBG:  Recent Labs Lab 05/22/15 1641 05/22/15 2107 05/23/15 0552 05/23/15 0608 05/23/15 0742  GLUCAP 135* 151* 53* 74 31    Microbiology: Results for orders placed or performed during the hospital encounter of 05/15/15  Blood culture (routine x 2)     Status: None   Collection Time: 05/15/15  4:11 PM  Result Value Ref Range Status   Specimen Description BLOOD RIGHT WRIST  Final   Special Requests   Final    BOTTLES DRAWN AEROBIC AND ANAEROBIC 1CC AEROBIC,1CCANA   Culture NO GROWTH 5 DAYS  Final   Report Status 05/20/2015 FINAL  Final  Blood culture (routine x 2)     Status: None   Collection Time: 05/15/15  4:20 PM  Result Value Ref Range Status   Specimen Description BLOOD LEFT HAND  Final   Special Requests BOTTLES DRAWN AEROBIC AND ANAEROBIC 5CC  Final   Culture NO GROWTH 5 DAYS  Final   Report  Status 05/20/2015 FINAL  Final  MRSA PCR Screening     Status: Abnormal   Collection Time: 05/15/15  8:57 PM  Result Value Ref Range Status   MRSA by PCR POSITIVE (A) NEGATIVE Final    Comment: READ BACK AND VERIFIED BY CASSIE STEWART @0028  05/16/15.Marland KitchenMarland KitchenAJO        The GeneXpert MRSA Assay (FDA approved for NASAL specimens only), is one component of a comprehensive MRSA colonization surveillance program. It is not intended to diagnose MRSA infection nor to guide or monitor treatment for MRSA infections.     Coagulation Studies: No results for input(s): LABPROT, INR in the last 72 hours.  Urinalysis: No results for input(s): COLORURINE, LABSPEC, PHURINE, GLUCOSEU, HGBUR, BILIRUBINUR, KETONESUR, PROTEINUR, UROBILINOGEN, NITRITE, LEUKOCYTESUR in  the last 72 hours.  Invalid input(s): APPERANCEUR    Imaging: No results found.   Medications:     . ALPRAZolam  0.5 mg Oral BID  . antiseptic oral rinse  7 mL Mouth Rinse BID  . aspirin EC  81 mg Oral Daily  . atorvastatin  40 mg Oral q1800  . cefTRIAXone (ROCEPHIN)  IV  1 g Intravenous Q24H  . furosemide  20 mg Oral Daily  . heparin subcutaneous  5,000 Units Subcutaneous 3 times per day  . insulin aspart  0-5 Units Subcutaneous QHS  . insulin aspart  0-9 Units Subcutaneous TID WC  . insulin detemir  20 Units Subcutaneous QHS  . ketoconazole  1 application Topical BID  . magnesium oxide  400 mg Oral Daily  . megestrol  200 mg Oral Daily  . mirtazapine  15 mg Oral QHS  . pantoprazole  40 mg Oral QAC breakfast  . sertraline  25 mg Oral Daily  . sodium chloride  3 mL Intravenous Q12H  . tamsulosin  0.4 mg Oral Daily   oxyCODONE, promethazine  Assessment/ Plan:  [name] is a 79 y.o. white female SNF resident with peripheral vascular disease, hypertension, diabetes mellitus type II Insulin dependent, anemia, hyperlipidemia, history of uterine cancer and bladder cancer with chronic foley catheter, who was admitted to [facility] on 05/15/2015  1. Acute renal failure on chronic kidney disease stage III: baseline creatinine range of 1.5-1.8. Presentation of creatinine of 2.88. Acute renal failure from acute coronary syndrome, acute cardiorenal syndrome from ischemic cardiomyopathy, and IV contrast exposure 05/14/15.  Chronic kidney disease secondary to vascular disease, hypertension and diabetes.  Urinalysis this admission shows LE 3+, protein, TNTC RBCs, TNTC WBCs - Creatinine currently 1.72 and BUN has also declined to 68. Continue to monitor renal function daily for now. Avoid nephrotoxins as possible.  2. Hypertension: with acute exacerbation of congestive heart failure. Echocardiogram with diastolic dysfunction .  - blood pressure well controlled at 122/61. She remains off  of all her antihypertensives  3. Diabetes Mellitus type II Insulin dependent with chronic kidney disease:  hemoglobin A1c 7.4%  - Continue insulin regimen and glucose control.  4. Anemia of CKD: Hemoglobin was 8 yesterday. Continue to monitor CBC as she is at high risk for worsening anemia.   LOS: 8 Pamalee Marcoe 12/15/201610:40 AM

## 2015-05-23 NOTE — Progress Notes (Signed)
Pt is still confused and calling out for her father and mother. Pt was given scheduled dose of xanax.

## 2015-05-24 DIAGNOSIS — I214 Non-ST elevation (NSTEMI) myocardial infarction: Secondary | ICD-10-CM | POA: Diagnosis not present

## 2015-05-24 DIAGNOSIS — C55 Malignant neoplasm of uterus, part unspecified: Secondary | ICD-10-CM | POA: Diagnosis not present

## 2015-05-24 DIAGNOSIS — I739 Peripheral vascular disease, unspecified: Secondary | ICD-10-CM | POA: Diagnosis not present

## 2015-05-24 LAB — BASIC METABOLIC PANEL
Anion gap: 6 (ref 5–15)
BUN: 66 mg/dL — AB (ref 6–20)
CHLORIDE: 113 mmol/L — AB (ref 101–111)
CO2: 24 mmol/L (ref 22–32)
CREATININE: 1.68 mg/dL — AB (ref 0.44–1.00)
Calcium: 8.4 mg/dL — ABNORMAL LOW (ref 8.9–10.3)
GFR, EST AFRICAN AMERICAN: 33 mL/min — AB (ref >60)
GFR, EST NON AFRICAN AMERICAN: 28 mL/min — AB (ref >60)
Glucose, Bld: 151 mg/dL — ABNORMAL HIGH (ref 65–99)
POTASSIUM: 4.1 mmol/L (ref 3.5–5.1)
SODIUM: 143 mmol/L (ref 135–145)

## 2015-05-24 LAB — CBC
HCT: 31.4 % — ABNORMAL LOW (ref 35.0–47.0)
HEMOGLOBIN: 9.8 g/dL — AB (ref 12.0–16.0)
MCH: 27.1 pg (ref 26.0–34.0)
MCHC: 31.2 g/dL — ABNORMAL LOW (ref 32.0–36.0)
MCV: 86.8 fL (ref 80.0–100.0)
PLATELETS: 358 10*3/uL (ref 150–440)
RBC: 3.61 MIL/uL — AB (ref 3.80–5.20)
RDW: 19.1 % — ABNORMAL HIGH (ref 11.5–14.5)
WBC: 9.2 10*3/uL (ref 3.6–11.0)

## 2015-05-24 LAB — GLUCOSE, CAPILLARY
GLUCOSE-CAPILLARY: 125 mg/dL — AB (ref 65–99)
GLUCOSE-CAPILLARY: 92 mg/dL (ref 65–99)

## 2015-05-24 MED ORDER — SERTRALINE HCL 25 MG PO TABS: 25.0000 mg | ORAL_TABLET | Freq: Every day | ORAL | Status: DC

## 2015-05-24 MED ORDER — RISPERIDONE 0.25 MG PO TABS
0.2500 mg | ORAL_TABLET | Freq: Every day | ORAL | Status: DC
Start: 2015-05-24 — End: 2015-06-12

## 2015-05-24 MED ORDER — ALPRAZOLAM 0.5 MG PO TABS: 0.5000 mg | ORAL_TABLET | Freq: Two times a day (BID) | ORAL | Status: DC | PRN

## 2015-05-24 MED ORDER — ATORVASTATIN CALCIUM 40 MG PO TABS: 40.0000 mg | ORAL_TABLET | Freq: Every day | ORAL | Status: DC

## 2015-05-24 MED ORDER — DOCUSATE SODIUM 100 MG PO CAPS: 100.0000 mg | ORAL_CAPSULE | Freq: Two times a day (BID) | ORAL | Status: DC

## 2015-05-24 MED ORDER — INSULIN DETEMIR 100 UNIT/ML ~~LOC~~ SOLN: 15.0000 [IU] | Freq: Every day | SUBCUTANEOUS | Status: DC

## 2015-05-24 MED ORDER — OXYCODONE HCL 15 MG PO TABS: 15.0000 mg | ORAL_TABLET | Freq: Four times a day (QID) | ORAL | Status: DC | PRN

## 2015-05-24 MED ORDER — FUROSEMIDE 20 MG PO TABS: 20.0000 mg | ORAL_TABLET | Freq: Every day | ORAL | Status: DC

## 2015-05-24 NOTE — Discharge Summary (Signed)
Person at [facility] [place] NAME: Shelley Fields    MR#:  [id number]  DATE OF BIRTH:  02-06-1936  DATE OF ADMISSION:  05/15/2015 ADMITTING PHYSICIAN: Vaughan Basta, MD  DATE OF DISCHARGE: 05/24/2015  PRIMARY CARE PHYSICIAN: [name], MD    ADMISSION DIAGNOSIS:  Acute pulmonary edema ([place]) [J81.0] Pulmonary edema [J81.1] Dyspnea [R06.00] Hyperglycemia [R73.9] Hypoxia [R09.02] NSTEMI (non-ST elevated myocardial infarction) ([place]) [I21.4]  DISCHARGE DIAGNOSIS:  Principal Problem:   NSTEMI (non-ST elevated myocardial infarction) ([place]) Active Problems:   CHF (congestive heart failure) (HCC)   Pressure ulcer   Gangrene on foot- Need vascular follow up to arrange for surgery.  SECONDARY DIAGNOSIS:   Past Medical History  Diagnosis Date  . Diabetes mellitus without complication ([place])   . C. difficile diarrhea   . Uterine cancer ([place])   . Hypogammaglobulinemia ([place])   . Pulmonary embolism ([place])   . Decubitus ulcers   . Hypertension   . Hypercholesterolemia   . Incontinence of urine   . Cancer [facility])     Endometrial Cancer  . Cervical cancer ([place])     with radiation 45 years ago...  . Arthritis   . GERD (gastroesophageal reflux disease)   . Bladder cancer ([place])   . Decubitus ulcer of buttock, stage 1   . Peripheral vascular disease ([place])   . Anemia     HOSPITAL COURSE:   * Non-ST elevation MI Patient is currently asymptomatic ,  given Heparin IV drip for 48-72 hours and then stopped. Cardiology is defering Cardiac catheterization  Continue Aspirin, statin Reviewed echo. Discontinue telemetry.stable.  * Acute on chronic Congestive heart failure EF 55%- so it was diastolic Heart failure. Continued IV Lasix - switched to oral. Intake and output measurement   Had SOB on presentation, now on room air.  *Anemia with hemoglobin 7.3 in the setting of non-STEMI Status post 1 unit of blood  transfusion - stable Hb now.  Gradual drop from > 9 to 8- monitor.  * Acute on chronic renal failure Baseline creatinine was around 1.3 for 5 month ago, now it is more than 1.8 Patient is on IV Lasix for acute on chronic CHF, monitor renal function,: Nephrology consult is appreciated. Hold ACE inhibitor and try to maintain the blood pressure. stable renal func now.  * History of pulmonary embolism Her INR was subtherapeutic , held Coumadin for now Patient was on heparin drip for non-STEMI - now stopped.  Will wait for vascular- as there is plan for surgery.  I discussed with her daughter also- she does not know- when PE was there.  I checked PMD's notes in old chart- as per him even in march 2016- PE is in past history. I did not find any CT in out records showing PE.  Or Coumadin may have been continued for PVD- in that case , I will leave it up to vascular to resume after possible surgery or not.  Otherwise- for past PE- she don't need to be on that now.  Currently keep on heparin [place] till surgery.  * Hyperglycemia with diabetes given 10 units of Levemir - increased to 30 due to hyperglycemia. on sliding scale coverage.  Had hypoglycemia on 05/21/15 morning- decreased to 25 units levemir.  Again Hypo on 05/23/15- so decreased to 15 units Q HS.  * Hypertension She is on amlodipine, atenolol and Lasix .  BP runs on lower so stopped atenolol, clonidine. Stable after that.  *  gangene on foot- PVD  S/p vascular procedure before admission. Total twice in last 3 months.  Appreciated vascular and podiatry.  Spoke to Dr. Carroll Kinds-  He will have pt in office next week with daughter- to arrange for surgery.  * yeast infection in groin.  Nystatin powder and fluconazol oral.  * Confusion and agitation  Likely delirium as a part of dementia  Oral xanax and risperidone.    DISCHARGE CONDITIONS:   Stable.  CONSULTS OBTAINED:  Treatment Team:  Isaias Cowman, MD Lavonia Dana, MD Nicholes Mango, MD Dionisio David, MD Vaughan Basta, MD Albertine Patricia, DPM  DRUG ALLERGIES:   Allergies  Allergen Reactions  . Contrast Media [Iodinated Diagnostic Agents]     DISCHARGE MEDICATIONS:   Current Discharge Medication List    START taking these medications   Details  ALPRAZolam (XANAX) 0.5 MG tablet Take 1 tablet (0.5 mg total) by mouth 2 (two) times daily as needed for anxiety. Qty: 20 tablet, Refills: 0    atorvastatin (LIPITOR) 40 MG tablet Take 1 tablet (40 mg total) by mouth daily at 6 PM. Qty: 30 tablet, Refills: 0    docusate sodium (COLACE) 100 MG capsule Take 1 capsule (100 mg total) by mouth 2 (two) times daily. Qty: 10 capsule, Refills: 0    insulin detemir (LEVEMIR) 100 UNIT/ML injection Inject 0.15 mLs (15 Units total) into the skin at bedtime. Qty: 10 mL, Refills: 11    risperiDONE (RISPERDAL) 0.25 MG tablet Take 1 tablet (0.25 mg total) by mouth at bedtime. Qty: 10 tablet, Refills: 0    sertraline (ZOLOFT) 25 MG tablet Take 1 tablet (25 mg total) by mouth daily. Qty: 30 tablet, Refills: 0      CONTINUE these medications which have CHANGED   Details  furosemide (LASIX) 20 MG tablet Take 1 tablet (20 mg total) by mouth daily. Qty: 30 tablet, Refills: 0    oxyCODONE (ROXICODONE) 15 MG immediate release tablet Take 1 tablet (15 mg total) by mouth every 6 (six) hours as needed for severe pain. Qty: 30 tablet, Refills: 0      CONTINUE these medications which have NOT CHANGED   Details  amLODipine (NORVASC) 5 MG tablet TAKE 1 TABLET BY MOUTH EVERY DAY Qty: 90 tablet, Refills: 3    aspirin 81 MG tablet Take 81 mg by mouth daily.    ketoconazole (NIZORAL) 2 % cream Apply 1 application topically 2 (two) times daily.    magnesium oxide (MAG-OX) 400 MG tablet Take 400 mg by mouth daily.    megestrol (MEGACE ES) 625 MG/5ML suspension Take 1.6 mLs (200 mg total) by mouth daily. to stimulate appetite Qty:  150 mL, Refills: 0    mirtazapine (REMERON) 15 MG tablet Take 15 mg by mouth at bedtime.     mupirocin ointment (BACTROBAN) 2 %     promethazine (PHENERGAN) 25 MG tablet Take 25 mg by mouth every 8 (eight) hours as needed for nausea or vomiting.    tamsulosin (FLOMAX) 0.4 MG CAPS capsule Take 0.4 mg by mouth 2 (two) times daily.      STOP taking these medications     atenolol (TENORMIN) 50 MG tablet      benazepril (LOTENSIN) 40 MG tablet      cloNIDine (CATAPRES) 0.2 MG tablet      HUMULIN 70/30 (70-30) 100 UNIT/ML injection      hydrALAZINE (APRESOLINE) 100 MG tablet      insulin aspart (NOVOLOG) cartridge  lovastatin (MEVACOR) 20 MG tablet      meclizine (ANTIVERT) 25 MG tablet      MORPHINE SULFATE PO      nystatin cream (MYCOSTATIN)      omeprazole (PRILOSEC) 20 MG capsule      potassium chloride (K-DUR) 10 MEQ tablet      SANTYL ointment      VITAMIN K, PHYTONADIONE, PO      warfarin (COUMADIN) 1 MG tablet          DISCHARGE INSTRUCTIONS:    Follow with vascular surgery clinic in office in 3-4 days.  If you experience worsening of your admission symptoms, develop shortness of breath, life threatening emergency, suicidal or homicidal thoughts you must seek medical attention immediately by calling 911 or calling your MD immediately  if symptoms less severe.  You Must read complete instructions/literature along with all the possible adverse reactions/side effects for all the Medicines you take and that have been prescribed to you. Take any new Medicines after you have completely understood and accept all the possible adverse reactions/side effects.   Please note  You were cared for by a hospitalist during your hospital stay. If you have any questions about your discharge medications or the care you received while you were in the hospital after you are discharged, you can call the unit and asked to speak with the hospitalist on call if the hospitalist  that took care of you is not available. Once you are discharged, your primary care physician will handle any further medical issues. Please note that NO REFILLS for any discharge medications will be authorized once you are discharged, as it is imperative that you return to your primary care physician (or establish a relationship with a primary care physician if you do not have one) for your aftercare needs so that they can reassess your need for medications and monitor your lab values.    Today   CHIEF COMPLAINT:   Chief Complaint  Patient presents with  . Shortness of Breath    HISTORY OF PRESENT ILLNESS:  Jalei Stamler  is a 79 y.o. female with a known history of diabetes, pulmonary embolism, decubitus ulcer, hypertension, hypercholesterolemia, uterine cancer with metastasis, peripheral arterial disease and was seen by vascular Dr.Schiner yesterday and had angioplasty done on her right lower extremity. She lives in nursing home because of her overall weakness and not able to take care of herself for last 6 month. Today she was sent back to emergency room because she was short of breath, in ER she was noted to have a troponin of 14, pulmonary edema on chest x-ray on lungs, elevated BNP level. ER physician spoke to cardiologist and he agreed with heparin drip admission over here. Patient denies any chest pain or cough or sputum production.    VITAL SIGNS:  Blood pressure 169/51, pulse 76, temperature 97.5 F (36.4 C), temperature source Oral, resp. rate 18, height 5' (1.524 m), weight 69.264 kg (152 lb 11.2 oz), SpO2 97 %.  I/O:   Intake/Output Summary (Last 24 hours) at 05/24/15 1244 Last data filed at 05/24/15 0757  Gross per 24 hour  Intake     10 ml  Output   1150 ml  Net  -1140 ml    PHYSICAL EXAMINATION:   GENERAL: 79 y.o.-year-old patient lying in the bed with no acute distress.  EYES: Pupils equal, round, reactive to light and accommodation. No scleral icterus.  Extraocular muscles intact.  HEENT: Head atraumatic, normocephalic. Oropharynx and  nasopharynx clear.  NECK: Supple, no jugular venous distention. No thyroid enlargement, no tenderness.  LUNGS: Normal breath sounds bilaterally, no wheezing, rales,rhonchi or crepitation. No use of accessory muscles of respiration.  CARDIOVASCULAR: S1, S2 normal. No murmurs, rubs, or gallops.  ABDOMEN: Soft, nontender, nondistended. Bowel sounds present. No organomegaly or mass.  EXTREMITIES: No pedal edema, . Diminished peripheral pulses. Right distal foot is turning black on toes, and not able to palpate pulse on right dorsalis pedis artery. NEUROLOGIC: Cranial nerves II through XII are intact. Muscle strength 4/5 in all extremities. Sensation intact. Gait not checked.  PSYCHIATRIC: The patient is alert and calm but disoriented. SKIN: No obvious rash, lesion, or ulcer.   DATA REVIEW:   CBC  Recent Labs Lab 05/24/15 0354  WBC 9.2  HGB 9.8*  HCT 31.4*  PLT 358    Chemistries   Recent Labs Lab 05/24/15 0354  NA 143  K 4.1  CL 113*  CO2 24  GLUCOSE 151*  BUN 66*  CREATININE 1.68*  CALCIUM 8.4*    Cardiac Enzymes  Recent Labs Lab 05/17/15 1734  TROPONINI 4.84*    Microbiology Results  Results for orders placed or performed during the hospital encounter of 05/15/15  Blood culture (routine x 2)     Status: None   Collection Time: 05/15/15  4:11 PM  Result Value Ref Range Status   Specimen Description BLOOD RIGHT WRIST  Final   Special Requests   Final    BOTTLES DRAWN AEROBIC AND ANAEROBIC 1CC AEROBIC,1CCANA   Culture NO GROWTH 5 DAYS  Final   Report Status 05/20/2015 FINAL  Final  Blood culture (routine x 2)     Status: None   Collection Time: 05/15/15  4:20 PM  Result Value Ref Range Status   Specimen Description BLOOD LEFT HAND  Final   Special Requests BOTTLES DRAWN AEROBIC AND ANAEROBIC 5CC  Final   Culture NO GROWTH 5 DAYS  Final   Report Status 05/20/2015 FINAL   Final  MRSA PCR Screening     Status: Abnormal   Collection Time: 05/15/15  8:57 PM  Result Value Ref Range Status   MRSA by PCR POSITIVE (A) NEGATIVE Final    Comment: READ BACK AND VERIFIED BY CASSIE STEWART @0028  05/16/15.Marland KitchenMarland KitchenAJO        The GeneXpert MRSA Assay (FDA approved for NASAL specimens only), is one component of a comprehensive MRSA colonization surveillance program. It is not intended to diagnose MRSA infection nor to guide or monitor treatment for MRSA infections.     RADIOLOGY:  No results found.  EKG:   Orders placed or performed during the hospital encounter of 05/15/15  . ED EKG  . ED EKG      Management plans discussed with the patient, family and they are in agreement.  CODE STATUS:     Code Status Orders        Start     Ordered   05/15/15 2043  Do not attempt resuscitation (DNR)   Continuous    Question Answer Comment  In the event of cardiac or respiratory ARREST Do not call a "code blue"   In the event of cardiac or respiratory ARREST Do not perform Intubation, CPR, defibrillation or ACLS   In the event of cardiac or respiratory ARREST Use medication by any route, position, wound care, and other measures to relive pain and suffering. May use oxygen, suction and manual treatment of airway obstruction as needed for comfort.   Comments  confirmed with pt in presence of her sisters.      05/15/15 2042      TOTAL TIME TAKING CARE OF THIS PATIENT: 40 minutes.    Vaughan Basta M.D on 05/24/2015 at 12:44 PM  Between 7am to 6pm - Pager - [phone]  After 6pm go to [email or web address] - password [email or web address]  Office  [phone]  CC: Primary care physician; [name], MD   Note: This dictation was prepared with Dragon dictation along with smaller phrase technology. Any transcriptional errors that result from this process are unintentional.

## 2015-05-24 NOTE — Progress Notes (Signed)
Report called to RN at [facility]. EMS also called. IV was removed. Pt and daughter are informed of transport. Currently awaiting transport via EMS.

## 2015-05-24 NOTE — Care Management (Signed)
Discussed case with CSW Toma Copier   Dr Anselm Jungling, Dr Delana Meyer, and Fabian November at [facility] and [place]. Plan is for patient to be discharged back to [facility] with a follow up with her daughter at Dr Hal Morales office on Monday. Daughter is coming into town from out of [place] this weekend. Dr Delana Meyer has discussed with the daughter Tawny Asal POA. Patient may or may not have surgery on or about the 21st of December. Hospice will not be able to follow the patient until after surgery. If decision is not to do surgery then hospice will be able to take patient.  CSW is coordinating with [facility]

## 2015-05-24 NOTE — Progress Notes (Signed)
Initial appointment scheduled at the [facility] for June 12, 2015 at 9:30am. Thank you

## 2015-05-24 NOTE — Progress Notes (Signed)
Patient A with confusion. Tolerated meds with apple sauce. Patient slept well throughout the night. Patient was bath and dressing changed. LBM 05/24/15. Urine culture was sent to lab. Staff will continue to monitor and meet needs.

## 2015-05-24 NOTE — Discharge Instructions (Signed)
Heart Failure Clinic appointment on June 12, 2015 at 9:30am with Darylene Price, [place]. Please call [phone] to reschedule.

## 2015-05-24 NOTE — Progress Notes (Signed)
Nutrition Follow-up     INTERVENTION:  Meals and snacks: Cater to pt preferences Medical Nutrition Supplement Therapy: Will continue mightyshake and magic cup Coordination of care: if unable to meet nutritional needs and aggressive care is wanted recommend nutrition support.   NUTRITION DIAGNOSIS:   Increased nutrient needs related to chronic illness, wound healing as evidenced by estimated needs.   GOAL:   Patient will meet greater than or equal to 90% of their needs  Not meeting nutritional goals  MONITOR:    (Energy Intake, Anthropometrics, Electrolyte and renal Profile, Pulmonary Profile, Skin)  REASON FOR ASSESSMENT:    (Pressure Ulcer)    ASSESSMENT:   Noted family to decide hospice vs right BKA   Current Nutrition: pt confused this am and unable to answer questions.  Noted per I and O sheet intake 25%, 75%, 25%, 25% meals consumed   Gastrointestinal Profile: Last BM: 12/15   Scheduled Medications:  . ALPRAZolam  0.5 mg Oral BID  . antiseptic oral rinse  7 mL Mouth Rinse BID  . aspirin EC  81 mg Oral Daily  . atorvastatin  40 mg Oral q1800  . docusate sodium  100 mg Oral BID  . fluconazole  100 mg Oral Daily  . furosemide  20 mg Oral Daily  . heparin subcutaneous  5,000 Units Subcutaneous 3 times per day  . insulin aspart  0-5 Units Subcutaneous QHS  . insulin aspart  0-9 Units Subcutaneous TID WC  . insulin detemir  15 Units Subcutaneous QHS  . ketoconazole  1 application Topical BID  . magnesium oxide  400 mg Oral Daily  . megestrol  200 mg Oral Daily  . mirtazapine  15 mg Oral QHS  . morphine  30 mg Oral Q12H  . nystatin   Topical BID  . pantoprazole  40 mg Oral QAC breakfast  . risperiDONE  0.25 mg Oral QHS  . sertraline  25 mg Oral Daily  . sodium chloride  3 mL Intravenous Q12H  . tamsulosin  0.4 mg Oral Daily    Electrolyte/Renal Profile and Glucose Profile:   Recent Labs Lab 05/22/15 0555 05/23/15 0404 05/24/15 0354  NA 141 143  143  K 3.9 3.5 4.1  CL 111 110 113*  CO2 22 22 24   BUN 70* 68* 66*  CREATININE 1.93* 1.72* 1.68*  CALCIUM 7.8* 8.3* 8.4*  GLUCOSE 133* 44* 151*     Weight Trend since Admission: Filed Weights   05/15/15 1553 05/15/15 2042  Weight: 166 lb 7.2 oz (75.5 kg) 152 lb 11.2 oz (69.264 kg)      Diet Order:  Diet heart healthy/carb modified Room service appropriate?: Yes; Fluid consistency:: Thin; Fluid restriction:: 1200 mL Fluid  Skin:   pressure ulcer stage II, III, unstageable   Height:   Ht Readings from Last 1 Encounters:  05/15/15 5' (1.524 m)    Weight:   Wt Readings from Last 1 Encounters:  05/15/15 152 lb 11.2 oz (69.264 kg)    Ideal Body Weight:     BMI:  Body mass index is 29.82 kg/(m^2).  Estimated Nutritional Needs:   Kcal:  using IBW of 45.5kg, BEE: 862kcals, TEE: (IF 1.2-1.4)(AF 1.2) 1240-1447kcals  Protein:  55-68g protein (1.2-1.5g/kg)  Fluid:  1138-1367mL of fluid (25-67mL/kg)  EDUCATION NEEDS:   Education needs no appropriate at this time   Riva. Zenia Resides, [place], [place] (pager)

## 2015-05-24 NOTE — Progress Notes (Signed)
[facility]  ROUNDING NOTE   Subjective:  Creatinine currently down to 1.68. BUN down to 66. It appears that plans for inpatient amputation have been delayed.   Objective:  Vital signs in last 24 hours:  Temp:  [97.5 F (36.4 C)-98.7 F (37.1 C)] 97.5 F (36.4 C) (12/16 0509) Pulse Rate:  [62-81] 76 (12/16 0509) Resp:  [18] 18 (12/15 1322) BP: (118-169)/(49-56) 169/51 mmHg (12/16 0509) SpO2:  [97 %-100 %] 97 % (12/16 0509)  Weight change:  Filed Weights   05/15/15 1553 05/15/15 2042  Weight: 75.5 kg (166 lb 7.2 oz) 69.264 kg (152 lb 11.2 oz)    Intake/Output: I/O last 3 completed shifts: In: 69 [P.O.:70; I.V.:3] Out: 1450 [Urine:1450]   Intake/Output this shift:  Total I/O In: 0  Out: 200 [Urine:200]  Physical Exam: General: NAD  Head: Normocephalic, atraumatic. Moist oral mucosal membranes  Eyes: Anicteric  Neck: Supple, trachea midline  Lungs:  Clear to auscultation normal effort  Heart: Regular rate and rhythm  Abdomen:  Soft, nontender, BS present  Extremities:  no peripheral edema. right foot wrapped in gauze  Neurologic: Nonfocal, moving all four extremities  Skin: No lesions  GU Chronic indwelling foely    Basic Metabolic Panel:  Recent Labs Lab 05/20/15 0609 05/20/15 0821 05/21/15 0531 05/22/15 0555 05/23/15 0404 05/24/15 0354  NA 142  --  144 141 143 143  K 3.5  --  3.8 3.9 3.5 4.1  CL 110  --  113* 111 110 113*  CO2 25  --  22 22 22 24   GLUCOSE 131*  --  93 133* 44* 151*  BUN 78* 73* 73* 70* 68* 66*  CREATININE 1.94* 1.94* 1.81* 1.93* 1.72* 1.68*  CALCIUM 8.1*  --  8.2* 7.8* 8.3* 8.4*    Liver Function Tests: No results for input(s): AST, ALT, ALKPHOS, BILITOT, PROT, ALBUMIN in the last 168 hours. No results for input(s): LIPASE, AMYLASE in the last 168 hours. No results for input(s): AMMONIA in the last 168 hours.  CBC:  Recent Labs Lab 05/18/15 0617 05/20/15 0609 05/22/15 0555 05/24/15 0354  WBC 10.1 10.8 7.7  9.2  HGB 9.1* 8.8* 8.0* 9.8*  HCT 27.9* 27.5* 25.6* 31.4*  MCV 83.8 85.6 85.5 86.8  PLT 468* 436 338 358    Cardiac Enzymes:  Recent Labs Lab 05/17/15 1734  TROPONINI 4.84*    BNP: Invalid input(s): POCBNP  CBG:  Recent Labs Lab 05/23/15 1645 05/23/15 2054 05/23/15 2140 05/24/15 0729 05/24/15 1151  GLUCAP 207* 169* 150* 125* 25    Microbiology: Results for orders placed or performed during the hospital encounter of 05/15/15  Blood culture (routine x 2)     Status: None   Collection Time: 05/15/15  4:11 PM  Result Value Ref Range Status   Specimen Description BLOOD RIGHT WRIST  Final   Special Requests   Final    BOTTLES DRAWN AEROBIC AND ANAEROBIC 1CC AEROBIC,1CCANA   Culture NO GROWTH 5 DAYS  Final   Report Status 05/20/2015 FINAL  Final  Blood culture (routine x 2)     Status: None   Collection Time: 05/15/15  4:20 PM  Result Value Ref Range Status   Specimen Description BLOOD LEFT HAND  Final   Special Requests BOTTLES DRAWN AEROBIC AND ANAEROBIC 5CC  Final   Culture NO GROWTH 5 DAYS  Final   Report Status 05/20/2015 FINAL  Final  MRSA PCR Screening     Status: Abnormal   Collection Time:  05/15/15  8:57 PM  Result Value Ref Range Status   MRSA by PCR POSITIVE (A) NEGATIVE Final    Comment: READ BACK AND VERIFIED BY CASSIE STEWART @0028  05/16/15.Marland KitchenMarland KitchenAJO        The GeneXpert MRSA Assay (FDA approved for NASAL specimens only), is one component of a comprehensive MRSA colonization surveillance program. It is not intended to diagnose MRSA infection nor to guide or monitor treatment for MRSA infections.     Coagulation Studies: No results for input(s): LABPROT, INR in the last 72 hours.  Urinalysis: No results for input(s): COLORURINE, LABSPEC, PHURINE, GLUCOSEU, HGBUR, BILIRUBINUR, KETONESUR, PROTEINUR, UROBILINOGEN, NITRITE, LEUKOCYTESUR in the last 72 hours.  Invalid input(s): APPERANCEUR    Imaging: No results found.   Medications:     .  ALPRAZolam  0.5 mg Oral BID  . antiseptic oral rinse  7 mL Mouth Rinse BID  . aspirin EC  81 mg Oral Daily  . atorvastatin  40 mg Oral q1800  . docusate sodium  100 mg Oral BID  . fluconazole  100 mg Oral Daily  . furosemide  20 mg Oral Daily  . heparin subcutaneous  5,000 Units Subcutaneous 3 times per day  . insulin aspart  0-5 Units Subcutaneous QHS  . insulin aspart  0-9 Units Subcutaneous TID WC  . insulin detemir  15 Units Subcutaneous QHS  . ketoconazole  1 application Topical BID  . magnesium oxide  400 mg Oral Daily  . megestrol  200 mg Oral Daily  . mirtazapine  15 mg Oral QHS  . morphine  30 mg Oral Q12H  . nystatin   Topical BID  . pantoprazole  40 mg Oral QAC breakfast  . risperiDONE  0.25 mg Oral QHS  . sertraline  25 mg Oral Daily  . sodium chloride  3 mL Intravenous Q12H  . tamsulosin  0.4 mg Oral Daily   oxyCODONE, promethazine  Assessment/ Plan:  Ms. ARIANNY PRISBREY is a 79 y.o. white female SNF resident with peripheral vascular disease, hypertension, diabetes mellitus type II Insulin dependent, anemia, hyperlipidemia, history of uterine cancer and bladder cancer with chronic foley catheter, who was admitted to [facility] on 05/15/2015  1. Acute renal failure on chronic kidney disease stage III: baseline creatinine range of 1.5-1.8. Presentation of creatinine of 2.88. Acute renal failure from acute coronary syndrome, acute cardiorenal syndrome from ischemic cardiomyopathy, and IV contrast exposure 05/14/15.  Chronic kidney disease secondary to vascular disease, hypertension and diabetes.  Urinalysis this admission shows LE 3+, protein, TNTC RBCs, TNTC WBCs - creatinine down to 1.68 with a BUN of 66.  She does have underlying chronic kidney disease and will require followup for this.  We will schedule this with Dr. Juleen China.  2. Hypertension: with acute exacerbation of congestive heart failure. Echocardiogram with diastolic dysfunction .  - blood pressure elevated today at  169/51.  She was taken off of her antihypertensives while here.  Reevaluate as an outpatient.  3. Diabetes Mellitus type II Insulin dependent with chronic kidney disease:  hemoglobin A1c 7.4%  - Continue insulin regimen and glucose control.  4. Anemia of CKD: emoglobin currently 9.8.  Evaluate the patient for Epogen as an outpatient.  Followup CBC as an outpatient as well.   LOS: 9 Kadee Philyaw 12/16/20161:01 PM

## 2015-05-26 LAB — URINE CULTURE: Culture: NO GROWTH

## 2015-05-27 ENCOUNTER — Other Ambulatory Visit: Payer: Self-pay | Admitting: Vascular Surgery

## 2015-05-27 DIAGNOSIS — E785 Hyperlipidemia, unspecified: Secondary | ICD-10-CM | POA: Diagnosis not present

## 2015-05-27 DIAGNOSIS — I4891 Unspecified atrial fibrillation: Secondary | ICD-10-CM | POA: Diagnosis not present

## 2015-05-27 DIAGNOSIS — I5032 Chronic diastolic (congestive) heart failure: Secondary | ICD-10-CM | POA: Diagnosis not present

## 2015-05-27 DIAGNOSIS — M79609 Pain in unspecified limb: Secondary | ICD-10-CM | POA: Diagnosis not present

## 2015-05-27 DIAGNOSIS — I119 Hypertensive heart disease without heart failure: Secondary | ICD-10-CM | POA: Diagnosis not present

## 2015-05-27 DIAGNOSIS — E119 Type 2 diabetes mellitus without complications: Secondary | ICD-10-CM | POA: Diagnosis not present

## 2015-05-27 DIAGNOSIS — I10 Essential (primary) hypertension: Secondary | ICD-10-CM | POA: Diagnosis not present

## 2015-05-27 DIAGNOSIS — L89150 Pressure ulcer of sacral region, unstageable: Secondary | ICD-10-CM | POA: Diagnosis not present

## 2015-05-28 ENCOUNTER — Other Ambulatory Visit: Payer: Self-pay | Admitting: Hematology and Oncology

## 2015-05-28 ENCOUNTER — Encounter: Payer: Self-pay | Admitting: *Deleted

## 2015-05-28 ENCOUNTER — Inpatient Hospital Stay: Admission: RE | Admit: 2015-05-28 | Payer: Medicare Other | Source: Ambulatory Visit

## 2015-05-28 DIAGNOSIS — Z8541 Personal history of malignant neoplasm of cervix uteri: Secondary | ICD-10-CM | POA: Diagnosis not present

## 2015-05-28 DIAGNOSIS — K625 Hemorrhage of anus and rectum: Secondary | ICD-10-CM | POA: Diagnosis not present

## 2015-05-28 DIAGNOSIS — Z79891 Long term (current) use of opiate analgesic: Secondary | ICD-10-CM | POA: Diagnosis not present

## 2015-05-28 DIAGNOSIS — I70261 Atherosclerosis of native arteries of extremities with gangrene, right leg: Secondary | ICD-10-CM | POA: Diagnosis not present

## 2015-05-28 DIAGNOSIS — L89154 Pressure ulcer of sacral region, stage 4: Secondary | ICD-10-CM | POA: Diagnosis not present

## 2015-05-28 DIAGNOSIS — I700 Atherosclerosis of aorta: Secondary | ICD-10-CM | POA: Diagnosis not present

## 2015-05-28 DIAGNOSIS — K219 Gastro-esophageal reflux disease without esophagitis: Secondary | ICD-10-CM | POA: Diagnosis not present

## 2015-05-28 DIAGNOSIS — E440 Moderate protein-calorie malnutrition: Secondary | ICD-10-CM | POA: Diagnosis not present

## 2015-05-28 DIAGNOSIS — E1165 Type 2 diabetes mellitus with hyperglycemia: Secondary | ICD-10-CM | POA: Diagnosis not present

## 2015-05-28 DIAGNOSIS — I10 Essential (primary) hypertension: Secondary | ICD-10-CM | POA: Diagnosis not present

## 2015-05-28 DIAGNOSIS — E1152 Type 2 diabetes mellitus with diabetic peripheral angiopathy with gangrene: Secondary | ICD-10-CM | POA: Diagnosis not present

## 2015-05-28 DIAGNOSIS — Z79899 Other long term (current) drug therapy: Secondary | ICD-10-CM | POA: Diagnosis not present

## 2015-05-28 DIAGNOSIS — I130 Hypertensive heart and chronic kidney disease with heart failure and stage 1 through stage 4 chronic kidney disease, or unspecified chronic kidney disease: Secondary | ICD-10-CM | POA: Diagnosis not present

## 2015-05-28 DIAGNOSIS — I5022 Chronic systolic (congestive) heart failure: Secondary | ICD-10-CM | POA: Diagnosis not present

## 2015-05-28 DIAGNOSIS — K649 Unspecified hemorrhoids: Secondary | ICD-10-CM | POA: Diagnosis not present

## 2015-05-28 DIAGNOSIS — E119 Type 2 diabetes mellitus without complications: Secondary | ICD-10-CM | POA: Diagnosis not present

## 2015-05-28 DIAGNOSIS — D500 Iron deficiency anemia secondary to blood loss (chronic): Secondary | ICD-10-CM | POA: Diagnosis not present

## 2015-05-28 DIAGNOSIS — M79609 Pain in unspecified limb: Secondary | ICD-10-CM | POA: Diagnosis not present

## 2015-05-28 DIAGNOSIS — Z91041 Radiographic dye allergy status: Secondary | ICD-10-CM | POA: Diagnosis not present

## 2015-05-28 DIAGNOSIS — D649 Anemia, unspecified: Secondary | ICD-10-CM | POA: Diagnosis not present

## 2015-05-28 DIAGNOSIS — I739 Peripheral vascular disease, unspecified: Secondary | ICD-10-CM | POA: Diagnosis not present

## 2015-05-28 DIAGNOSIS — M6281 Muscle weakness (generalized): Secondary | ICD-10-CM | POA: Diagnosis not present

## 2015-05-28 DIAGNOSIS — I2510 Atherosclerotic heart disease of native coronary artery without angina pectoris: Secondary | ICD-10-CM | POA: Diagnosis not present

## 2015-05-28 DIAGNOSIS — E876 Hypokalemia: Secondary | ICD-10-CM | POA: Diagnosis not present

## 2015-05-28 DIAGNOSIS — Z7982 Long term (current) use of aspirin: Secondary | ICD-10-CM | POA: Diagnosis not present

## 2015-05-28 DIAGNOSIS — Z8551 Personal history of malignant neoplasm of bladder: Secondary | ICD-10-CM | POA: Diagnosis not present

## 2015-05-28 DIAGNOSIS — I252 Old myocardial infarction: Secondary | ICD-10-CM | POA: Diagnosis not present

## 2015-05-28 DIAGNOSIS — E870 Hyperosmolality and hypernatremia: Secondary | ICD-10-CM | POA: Diagnosis not present

## 2015-05-28 DIAGNOSIS — F0390 Unspecified dementia without behavioral disturbance: Secondary | ICD-10-CM | POA: Diagnosis not present

## 2015-05-28 DIAGNOSIS — Z8542 Personal history of malignant neoplasm of other parts of uterus: Secondary | ICD-10-CM | POA: Diagnosis not present

## 2015-05-28 DIAGNOSIS — Z66 Do not resuscitate: Secondary | ICD-10-CM | POA: Diagnosis not present

## 2015-05-28 DIAGNOSIS — N189 Chronic kidney disease, unspecified: Secondary | ICD-10-CM | POA: Diagnosis not present

## 2015-05-28 DIAGNOSIS — L89104 Pressure ulcer of unspecified part of back, stage 4: Secondary | ICD-10-CM | POA: Diagnosis not present

## 2015-05-28 DIAGNOSIS — K5900 Constipation, unspecified: Secondary | ICD-10-CM | POA: Diagnosis not present

## 2015-05-28 DIAGNOSIS — I509 Heart failure, unspecified: Secondary | ICD-10-CM | POA: Diagnosis not present

## 2015-05-28 NOTE — Pre-Procedure Instructions (Signed)
As requested by Dr Amie Critchley, request for cardiac clearance note faxed to Dr Saralyn Pilar. Cleared moderate risk

## 2015-05-28 NOTE — Patient Instructions (Signed)
  Your procedure is scheduled on: 05/29/15 Report to Day Surgery.[facility] SECOND FLOOR To find out your arrival time please call [phone] between 1PM - 3PM on 05/28/15 Remember: Instructions that are not followed completely may result in serious medical risk, up to and including death, or upon the discretion of your surgeon and anesthesiologist your surgery may need to be rescheduled.    _X___ 1. Do not eat food or drink liquids after midnight. No gum chewing or hard candies.     __X__ 2. No Alcohol for 24 hours before or after surgery.   ____ 3. Bring all medications with you on the day of surgery if instructed.    _X___ 4. Notify your doctor if there is any change in your medical condition     (cold, fever, infections).     Do not wear jewelry, make-up, hairpins, clips or nail polish.  Do not wear lotions, powders, or perfumes. You may wear deodorant.  Do not shave 48 hours prior to surgery. Men may shave face and neck.  Do not bring valuables to the hospital.    [facility] is not responsible for any belongings or valuables.               Contacts, dentures or bridgework may not be worn into surgery.  Leave your suitcase in the car. After surgery it may be brought to your room.  For patients admitted to the hospital, discharge time is determined by your                treatment team.   Patients discharged the day of surgery will not be allowed to drive home.   Please read over the following fact sheets that you were given:   Surgical Site Infection Prevention   ____ Take these medicines the morning of surgery with A SIP OF WATER:    1. ALPRAZOLAM  2. SERTRALINE  3. AMLODIPINE   4. MAGNESIUM OXIDE  5. TAMSULOSIN  6.  ____ Fleet Enema (as directed)   ____ Use CHG Soap as directed  ____ Use inhalers on the day of surgery  ____ Stop metformin 2 days prior to surgery    X_ Take 1/2 of usual insulin dose the night before surgery and none on the morning of surgery.    ____ Stop Coumadin/Plavix/aspirin on   ____ Stop Anti-inflammatories on   ____ Stop supplements until after surgery.    ____ Bring C-Pap to the hospital.

## 2015-05-28 NOTE — Pre-Procedure Instructions (Signed)
Spoke with Mitzi Hansen nurse at [facility] and faxed instructions to him

## 2015-05-29 ENCOUNTER — Inpatient Hospital Stay
Admission: RE | Admit: 2015-05-29 | Discharge: 2015-06-07 | DRG: 239 | Disposition: A | Discharge status: Skilled Nursing Facility | Payer: Medicare Other | Source: Ambulatory Visit | Attending: Vascular Surgery | Admitting: Vascular Surgery

## 2015-05-29 ENCOUNTER — Inpatient Hospital Stay: Payer: Medicare Other | Admitting: Anesthesiology

## 2015-05-29 ENCOUNTER — Encounter: Payer: Self-pay | Admitting: *Deleted

## 2015-05-29 ENCOUNTER — Encounter
Admission: RE | Disposition: A | Discharge status: Skilled Nursing Facility | Payer: Self-pay | Source: Ambulatory Visit | Attending: Vascular Surgery

## 2015-05-29 DIAGNOSIS — Z7982 Long term (current) use of aspirin: Secondary | ICD-10-CM | POA: Diagnosis not present

## 2015-05-29 DIAGNOSIS — E876 Hypokalemia: Secondary | ICD-10-CM | POA: Diagnosis present

## 2015-05-29 DIAGNOSIS — K219 Gastro-esophageal reflux disease without esophagitis: Secondary | ICD-10-CM | POA: Diagnosis present

## 2015-05-29 DIAGNOSIS — I130 Hypertensive heart and chronic kidney disease with heart failure and stage 1 through stage 4 chronic kidney disease, or unspecified chronic kidney disease: Secondary | ICD-10-CM | POA: Diagnosis present

## 2015-05-29 DIAGNOSIS — I739 Peripheral vascular disease, unspecified: Secondary | ICD-10-CM | POA: Diagnosis present

## 2015-05-29 DIAGNOSIS — N189 Chronic kidney disease, unspecified: Secondary | ICD-10-CM | POA: Diagnosis present

## 2015-05-29 DIAGNOSIS — I10 Essential (primary) hypertension: Secondary | ICD-10-CM | POA: Diagnosis present

## 2015-05-29 DIAGNOSIS — D649 Anemia, unspecified: Secondary | ICD-10-CM | POA: Diagnosis present

## 2015-05-29 DIAGNOSIS — Z8542 Personal history of malignant neoplasm of other parts of uterus: Secondary | ICD-10-CM

## 2015-05-29 DIAGNOSIS — Z8541 Personal history of malignant neoplasm of cervix uteri: Secondary | ICD-10-CM

## 2015-05-29 DIAGNOSIS — E1165 Type 2 diabetes mellitus with hyperglycemia: Secondary | ICD-10-CM | POA: Diagnosis present

## 2015-05-29 DIAGNOSIS — E440 Moderate protein-calorie malnutrition: Secondary | ICD-10-CM | POA: Diagnosis present

## 2015-05-29 DIAGNOSIS — K649 Unspecified hemorrhoids: Secondary | ICD-10-CM | POA: Diagnosis present

## 2015-05-29 DIAGNOSIS — Z79891 Long term (current) use of opiate analgesic: Secondary | ICD-10-CM

## 2015-05-29 DIAGNOSIS — K5900 Constipation, unspecified: Secondary | ICD-10-CM | POA: Diagnosis present

## 2015-05-29 DIAGNOSIS — Z91041 Radiographic dye allergy status: Secondary | ICD-10-CM

## 2015-05-29 DIAGNOSIS — E1152 Type 2 diabetes mellitus with diabetic peripheral angiopathy with gangrene: Secondary | ICD-10-CM | POA: Diagnosis present

## 2015-05-29 DIAGNOSIS — Z79899 Other long term (current) drug therapy: Secondary | ICD-10-CM | POA: Diagnosis not present

## 2015-05-29 DIAGNOSIS — L89321 Pressure ulcer of left buttock, stage 1: Secondary | ICD-10-CM | POA: Diagnosis present

## 2015-05-29 DIAGNOSIS — L89154 Pressure ulcer of sacral region, stage 4: Secondary | ICD-10-CM | POA: Diagnosis present

## 2015-05-29 DIAGNOSIS — D500 Iron deficiency anemia secondary to blood loss (chronic): Secondary | ICD-10-CM | POA: Diagnosis present

## 2015-05-29 DIAGNOSIS — I5022 Chronic systolic (congestive) heart failure: Secondary | ICD-10-CM | POA: Diagnosis present

## 2015-05-29 DIAGNOSIS — Z66 Do not resuscitate: Secondary | ICD-10-CM | POA: Diagnosis present

## 2015-05-29 DIAGNOSIS — I96 Gangrene, not elsewhere classified: Secondary | ICD-10-CM | POA: Diagnosis present

## 2015-05-29 DIAGNOSIS — I700 Atherosclerosis of aorta: Principal | ICD-10-CM | POA: Diagnosis present

## 2015-05-29 DIAGNOSIS — I252 Old myocardial infarction: Secondary | ICD-10-CM | POA: Diagnosis not present

## 2015-05-29 DIAGNOSIS — L89311 Pressure ulcer of right buttock, stage 1: Secondary | ICD-10-CM | POA: Diagnosis present

## 2015-05-29 DIAGNOSIS — E870 Hyperosmolality and hypernatremia: Secondary | ICD-10-CM | POA: Diagnosis present

## 2015-05-29 DIAGNOSIS — Z8551 Personal history of malignant neoplasm of bladder: Secondary | ICD-10-CM

## 2015-05-29 DIAGNOSIS — F0390 Unspecified dementia without behavioral disturbance: Secondary | ICD-10-CM | POA: Diagnosis present

## 2015-05-29 DIAGNOSIS — L89104 Pressure ulcer of unspecified part of back, stage 4: Secondary | ICD-10-CM | POA: Diagnosis present

## 2015-05-29 DIAGNOSIS — Z86711 Personal history of pulmonary embolism: Secondary | ICD-10-CM

## 2015-05-29 HISTORY — PX: AMPUTATION: SHX166

## 2015-05-29 HISTORY — DX: Hypoxemia: R09.02

## 2015-05-29 HISTORY — DX: Reserved for inherently not codable concepts without codable children: IMO0001

## 2015-05-29 HISTORY — DX: Chronic kidney disease, unspecified: N18.9

## 2015-05-29 HISTORY — DX: Atherosclerotic heart disease of native coronary artery without angina pectoris: I25.10

## 2015-05-29 HISTORY — DX: Unspecified dementia, unspecified severity, without behavioral disturbance, psychotic disturbance, mood disturbance, and anxiety: F03.90

## 2015-05-29 HISTORY — DX: Gangrene, not elsewhere classified: I96

## 2015-05-29 HISTORY — DX: Acute myocardial infarction, unspecified: I21.9

## 2015-05-29 HISTORY — DX: Heart failure, unspecified: I50.9

## 2015-05-29 LAB — TYPE AND SCREEN
ABO/RH(D): A POS
ANTIBODY SCREEN: NEGATIVE

## 2015-05-29 LAB — CBC WITH DIFFERENTIAL/PLATELET
BASOS ABS: 0.1 10*3/uL (ref 0–0.1)
BASOS PCT: 1 %
Eosinophils Absolute: 0.2 10*3/uL (ref 0–0.7)
Eosinophils Relative: 2 %
HEMATOCRIT: 33.2 % — AB (ref 35.0–47.0)
Hemoglobin: 10.2 g/dL — ABNORMAL LOW (ref 12.0–16.0)
LYMPHS PCT: 15 %
Lymphs Abs: 1.6 10*3/uL (ref 1.0–3.6)
MCH: 26.3 pg (ref 26.0–34.0)
MCHC: 30.7 g/dL — ABNORMAL LOW (ref 32.0–36.0)
MCV: 85.6 fL (ref 80.0–100.0)
MONO ABS: 0.7 10*3/uL (ref 0.2–0.9)
Monocytes Relative: 6 %
NEUTROS ABS: 8.2 10*3/uL — AB (ref 1.4–6.5)
Neutrophils Relative %: 76 %
PLATELETS: 367 10*3/uL (ref 150–440)
RBC: 3.87 MIL/uL (ref 3.80–5.20)
RDW: 18.3 % — AB (ref 11.5–14.5)
WBC: 10.7 10*3/uL (ref 3.6–11.0)

## 2015-05-29 LAB — BASIC METABOLIC PANEL
ANION GAP: 8 (ref 5–15)
BUN: 35 mg/dL — ABNORMAL HIGH (ref 6–20)
CALCIUM: 8.7 mg/dL — AB (ref 8.9–10.3)
CO2: 24 mmol/L (ref 22–32)
Chloride: 115 mmol/L — ABNORMAL HIGH (ref 101–111)
Creatinine, Ser: 1.07 mg/dL — ABNORMAL HIGH (ref 0.44–1.00)
GFR, EST AFRICAN AMERICAN: 57 mL/min — AB (ref >60)
GFR, EST NON AFRICAN AMERICAN: 49 mL/min — AB (ref >60)
Glucose, Bld: 269 mg/dL — ABNORMAL HIGH (ref 65–99)
POTASSIUM: 4.1 mmol/L (ref 3.5–5.1)
SODIUM: 147 mmol/L — AB (ref 135–145)

## 2015-05-29 LAB — APTT: APTT: 38 s — AB (ref 24–36)

## 2015-05-29 LAB — GLUCOSE, CAPILLARY
Glucose-Capillary: 202 mg/dL — ABNORMAL HIGH (ref 65–99)
Glucose-Capillary: 237 mg/dL — ABNORMAL HIGH (ref 65–99)

## 2015-05-29 LAB — PROTIME-INR
INR: 1.92
Prothrombin Time: 21.9 seconds — ABNORMAL HIGH (ref 11.4–15.0)

## 2015-05-29 SURGERY — AMPUTATION BELOW KNEE
Anesthesia: General | Site: Leg Lower | Laterality: Right | Wound class: Clean

## 2015-05-29 MED ORDER — SODIUM CHLORIDE 0.9 % IV SOLN
INTRAVENOUS | Status: DC
  Administered 2015-05-29: 75 mL/h via INTRAVENOUS

## 2015-05-29 MED ORDER — AMLODIPINE BESYLATE 5 MG PO TABS
5.0000 mg | ORAL_TABLET | Freq: Every day | ORAL | Status: DC
  Administered 2015-05-30 – 2015-06-03 (×5): 5 mg via ORAL
  Filled 2015-05-29 (×5): qty 1

## 2015-05-29 MED ORDER — MAGNESIUM HYDROXIDE 400 MG/5ML PO SUSP: 30.0000 mL | Freq: Every day | ORAL | Status: DC | PRN

## 2015-05-29 MED ORDER — ONDANSETRON HCL 4 MG/2ML IJ SOLN: 4.0000 mg | Freq: Once | INTRAMUSCULAR | Status: DC | PRN

## 2015-05-29 MED ORDER — ENOXAPARIN SODIUM 40 MG/0.4ML ~~LOC~~ SOLN: 40.0000 mg | SUBCUTANEOUS | Status: DC

## 2015-05-29 MED ORDER — CEFAZOLIN SODIUM-DEXTROSE 2-3 GM-% IV SOLR: 2.0000 g | INTRAVENOUS | Status: DC

## 2015-05-29 MED ORDER — FAMOTIDINE 20 MG PO TABS
20.0000 mg | ORAL_TABLET | Freq: Once | ORAL | Status: AC
  Administered 2015-05-29: 20 mg via ORAL

## 2015-05-29 MED ORDER — TAMSULOSIN HCL 0.4 MG PO CAPS
0.4000 mg | ORAL_CAPSULE | Freq: Two times a day (BID) | ORAL | Status: DC
  Administered 2015-05-29 – 2015-06-07 (×18): 0.4 mg via ORAL
  Filled 2015-05-29 (×18): qty 1

## 2015-05-29 MED ORDER — ONDANSETRON HCL 4 MG/2ML IJ SOLN: 4.0000 mg | Freq: Four times a day (QID) | INTRAMUSCULAR | Status: DC | PRN

## 2015-05-29 MED ORDER — DEXTROSE-NACL 5-0.9 % IV SOLN
INTRAVENOUS | Status: DC
  Administered 2015-05-29 – 2015-05-30 (×3): via INTRAVENOUS

## 2015-05-29 MED ORDER — ONDANSETRON HCL 4 MG/2ML IJ SOLN
INTRAMUSCULAR | Status: DC | PRN
  Administered 2015-05-29: 4 mg via INTRAVENOUS

## 2015-05-29 MED ORDER — OXYCODONE HCL 5 MG PO TABS
15.0000 mg | ORAL_TABLET | Freq: Four times a day (QID) | ORAL | Status: DC | PRN
  Administered 2015-05-29 – 2015-05-30 (×2): 15 mg via ORAL
  Filled 2015-05-29 (×2): qty 3

## 2015-05-29 MED ORDER — FLEET ENEMA 7-19 GM/118ML RE ENEM: 1.0000 | ENEMA | Freq: Once | RECTAL | Status: DC | PRN

## 2015-05-29 MED ORDER — SERTRALINE HCL 50 MG PO TABS
25.0000 mg | ORAL_TABLET | Freq: Every day | ORAL | Status: DC
  Administered 2015-05-30 – 2015-06-07 (×9): 25 mg via ORAL
  Filled 2015-05-29 (×11): qty 1

## 2015-05-29 MED ORDER — PROMETHAZINE HCL 25 MG PO TABS: 25.0000 mg | ORAL_TABLET | Freq: Three times a day (TID) | ORAL | Status: DC | PRN

## 2015-05-29 MED ORDER — ASPIRIN 81 MG PO TABS: 81.0000 mg | ORAL_TABLET | Freq: Every day | ORAL | Status: DC

## 2015-05-29 MED ORDER — ENOXAPARIN SODIUM 30 MG/0.3ML ~~LOC~~ SOLN
30.0000 mg | Freq: Two times a day (BID) | SUBCUTANEOUS | Status: DC
  Administered 2015-05-30 – 2015-06-04 (×11): 30 mg via SUBCUTANEOUS
  Filled 2015-05-29 (×11): qty 0.3

## 2015-05-29 MED ORDER — CEFAZOLIN SODIUM-DEXTROSE 2-3 GM-% IV SOLR
INTRAVENOUS | Status: AC
  Administered 2015-05-29: 18:00:00
  Filled 2015-05-29: qty 50

## 2015-05-29 MED ORDER — RISPERIDONE 0.5 MG PO TABS
0.2500 mg | ORAL_TABLET | Freq: Every day | ORAL | Status: DC
  Administered 2015-05-29 – 2015-06-06 (×9): 0.25 mg via ORAL
  Filled 2015-05-29 (×9): qty 1

## 2015-05-29 MED ORDER — MAGNESIUM OXIDE 400 (241.3 MG) MG PO TABS
400.0000 mg | ORAL_TABLET | Freq: Every day | ORAL | Status: DC
  Administered 2015-05-30 – 2015-06-07 (×9): 400 mg via ORAL
  Filled 2015-05-29 (×9): qty 1

## 2015-05-29 MED ORDER — ALUM & MAG HYDROXIDE-SIMETH 200-200-20 MG/5ML PO SUSP: 30.0000 mL | Freq: Four times a day (QID) | ORAL | Status: DC | PRN

## 2015-05-29 MED ORDER — FENTANYL CITRATE (PF) 100 MCG/2ML IJ SOLN
INTRAMUSCULAR | Status: DC | PRN
  Administered 2015-05-29 (×2): 50 ug via INTRAVENOUS

## 2015-05-29 MED ORDER — INSULIN DETEMIR 100 UNIT/ML ~~LOC~~ SOLN
15.0000 [IU] | Freq: Every day | SUBCUTANEOUS | Status: DC
  Administered 2015-05-29 – 2015-06-01 (×4): 15 [IU] via SUBCUTANEOUS
  Filled 2015-05-29 (×5): qty 0.15

## 2015-05-29 MED ORDER — ASPIRIN EC 81 MG PO TBEC
81.0000 mg | DELAYED_RELEASE_TABLET | Freq: Every day | ORAL | Status: DC
  Administered 2015-05-30 – 2015-06-07 (×9): 81 mg via ORAL
  Filled 2015-05-29 (×9): qty 1

## 2015-05-29 MED ORDER — MIRTAZAPINE 15 MG PO TABS
15.0000 mg | ORAL_TABLET | Freq: Every day | ORAL | Status: DC
  Administered 2015-05-29 – 2015-06-06 (×9): 15 mg via ORAL
  Filled 2015-05-29 (×9): qty 1

## 2015-05-29 MED ORDER — SORBITOL 70 % SOLN
30.0000 mL | Freq: Every day | Status: DC | PRN
  Filled 2015-05-29: qty 30

## 2015-05-29 MED ORDER — ACETAMINOPHEN 650 MG RE SUPP: 650.0000 mg | Freq: Four times a day (QID) | RECTAL | Status: DC | PRN

## 2015-05-29 MED ORDER — VANCOMYCIN HCL IN DEXTROSE 1-5 GM/200ML-% IV SOLN
INTRAVENOUS | Status: AC
  Administered 2015-05-29: 1000 mg via INTRAVENOUS
  Filled 2015-05-29: qty 200

## 2015-05-29 MED ORDER — ACETAMINOPHEN 325 MG PO TABS: 650.0000 mg | ORAL_TABLET | Freq: Four times a day (QID) | ORAL | Status: DC | PRN

## 2015-05-29 MED ORDER — FUROSEMIDE 20 MG PO TABS
20.0000 mg | ORAL_TABLET | Freq: Every day | ORAL | Status: DC
  Administered 2015-05-30 – 2015-06-01 (×3): 20 mg via ORAL
  Filled 2015-05-29 (×4): qty 1

## 2015-05-29 MED ORDER — HYDROCODONE-ACETAMINOPHEN 5-325 MG PO TABS
1.0000 | ORAL_TABLET | ORAL | Status: DC | PRN
  Administered 2015-06-01 – 2015-06-07 (×13): 1 via ORAL
  Filled 2015-05-29 (×10): qty 1
  Filled 2015-05-29: qty 2
  Filled 2015-05-29 (×4): qty 1

## 2015-05-29 MED ORDER — FAMOTIDINE 20 MG PO TABS
ORAL_TABLET | ORAL | Status: AC
  Administered 2015-05-29: 20 mg via ORAL
  Filled 2015-05-29: qty 1

## 2015-05-29 MED ORDER — FENTANYL CITRATE (PF) 100 MCG/2ML IJ SOLN
25.0000 ug | INTRAMUSCULAR | Status: DC | PRN
  Administered 2015-05-29 (×4): 25 ug via INTRAVENOUS

## 2015-05-29 MED ORDER — MORPHINE SULFATE (PF) 2 MG/ML IV SOLN
2.0000 mg | INTRAVENOUS | Status: DC | PRN
  Administered 2015-05-29 – 2015-05-31 (×4): 2 mg via INTRAVENOUS
  Filled 2015-05-29 (×4): qty 1

## 2015-05-29 MED ORDER — ATORVASTATIN CALCIUM 20 MG PO TABS
40.0000 mg | ORAL_TABLET | Freq: Every day | ORAL | Status: DC
  Administered 2015-05-30 – 2015-06-01 (×2): 40 mg via ORAL
  Administered 2015-06-02: 20 mg via ORAL
  Administered 2015-06-04 – 2015-06-06 (×3): 40 mg via ORAL
  Filled 2015-05-29 (×3): qty 2
  Filled 2015-05-29 (×2): qty 1
  Filled 2015-05-29 (×3): qty 2

## 2015-05-29 MED ORDER — DOCUSATE SODIUM 100 MG PO CAPS
100.0000 mg | ORAL_CAPSULE | Freq: Two times a day (BID) | ORAL | Status: DC
  Administered 2015-05-29 – 2015-05-31 (×4): 100 mg via ORAL
  Filled 2015-05-29 (×5): qty 1

## 2015-05-29 MED ORDER — PROPOFOL 10 MG/ML IV BOLUS
INTRAVENOUS | Status: DC | PRN
  Administered 2015-05-29: 80 mg via INTRAVENOUS

## 2015-05-29 MED ORDER — SODIUM CHLORIDE 0.9 % IJ SOLN
3.0000 mL | Freq: Two times a day (BID) | INTRAMUSCULAR | Status: DC
  Administered 2015-05-29 – 2015-06-06 (×17): 3 mL via INTRAVENOUS

## 2015-05-29 MED ORDER — SUCCINYLCHOLINE CHLORIDE 20 MG/ML IJ SOLN
INTRAMUSCULAR | Status: DC | PRN
  Administered 2015-05-29: 60 mg via INTRAVENOUS

## 2015-05-29 MED ORDER — FENTANYL CITRATE (PF) 100 MCG/2ML IJ SOLN
INTRAMUSCULAR | Status: AC
  Administered 2015-05-29: 25 ug via INTRAVENOUS
  Filled 2015-05-29: qty 2

## 2015-05-29 MED ORDER — ALPRAZOLAM 0.5 MG PO TABS
0.5000 mg | ORAL_TABLET | Freq: Two times a day (BID) | ORAL | Status: DC | PRN
  Administered 2015-06-01 – 2015-06-06 (×6): 0.5 mg via ORAL
  Filled 2015-05-29 (×6): qty 1

## 2015-05-29 MED ORDER — VANCOMYCIN HCL IN DEXTROSE 1-5 GM/200ML-% IV SOLN
1000.0000 mg | Freq: Once | INTRAVENOUS | Status: AC
  Administered 2015-05-29: 1000 mg via INTRAVENOUS

## 2015-05-29 MED ORDER — DOCUSATE SODIUM 100 MG PO CAPS: 100.0000 mg | ORAL_CAPSULE | Freq: Two times a day (BID) | ORAL | Status: DC

## 2015-05-29 MED ORDER — EPHEDRINE SULFATE 50 MG/ML IJ SOLN
INTRAMUSCULAR | Status: DC | PRN
  Administered 2015-05-29 (×2): 10 mg via INTRAVENOUS

## 2015-05-29 MED ORDER — ONDANSETRON HCL 4 MG PO TABS: 4.0000 mg | ORAL_TABLET | Freq: Four times a day (QID) | ORAL | Status: DC | PRN

## 2015-05-29 SURGICAL SUPPLY — 39 items
BAG COUNTER SPONGE EZ (MISCELLANEOUS) ×2 IMPLANT
BANDAGE ELASTIC 4 LF NS (GAUZE/BANDAGES/DRESSINGS) ×3 IMPLANT
BLADE SAW GIGLI 510 (BLADE) ×2 IMPLANT
BLADE SAW GIGLI 510MM (BLADE) ×1
BLADE SURG SZ10 CARB STEEL (BLADE) ×3 IMPLANT
BNDG COHESIVE 4X5 TAN STRL (GAUZE/BANDAGES/DRESSINGS) ×3 IMPLANT
BNDG GAUZE 4.5X4.1 6PLY STRL (MISCELLANEOUS) ×3 IMPLANT
BRUSH SCRUB 4% CHG (MISCELLANEOUS) ×3 IMPLANT
CANISTER SUCT 1200ML W/VALVE (MISCELLANEOUS) ×3 IMPLANT
CHLORAPREP W/TINT 26ML (MISCELLANEOUS) ×3 IMPLANT
COUNTER SPONGE BAG EZ (MISCELLANEOUS) ×1
ELECT CAUTERY BLADE 6.4 (BLADE) ×3 IMPLANT
GAUZE FLUFF 18X24 1PLY STRL (GAUZE/BANDAGES/DRESSINGS) ×3 IMPLANT
GAUZE PETRO XEROFOAM 5X9 (MISCELLANEOUS) ×3 IMPLANT
GAUZE SPONGE 4X4 12PLY STRL (GAUZE/BANDAGES/DRESSINGS) ×3 IMPLANT
GLOVE SURG SYN 8.0 (GLOVE) ×3 IMPLANT
GOWN STRL REUS W/ TWL LRG LVL3 (GOWN DISPOSABLE) ×1 IMPLANT
GOWN STRL REUS W/ TWL XL LVL3 (GOWN DISPOSABLE) ×1 IMPLANT
GOWN STRL REUS W/TWL LRG LVL3 (GOWN DISPOSABLE) ×2
GOWN STRL REUS W/TWL XL LVL3 (GOWN DISPOSABLE) ×2
HANDLE YANKAUER SUCT BULB TIP (MISCELLANEOUS) ×3 IMPLANT
KIT RM TURNOVER STRD PROC AR (KITS) ×3 IMPLANT
LABEL OR SOLS (LABEL) ×3 IMPLANT
NS IRRIG 500ML POUR BTL (IV SOLUTION) ×3 IMPLANT
PACK EXTREMITY ARMC (MISCELLANEOUS) ×3 IMPLANT
PAD ABD DERMACEA PRESS 5X9 (GAUZE/BANDAGES/DRESSINGS) ×3 IMPLANT
PAD GROUND ADULT SPLIT (MISCELLANEOUS) ×3 IMPLANT
PAD PREP 24X41 OB/GYN DISP (PERSONAL CARE ITEMS) ×3 IMPLANT
SPONGE LAP 18X18 5 PK (GAUZE/BANDAGES/DRESSINGS) ×6 IMPLANT
STAPLER SKIN PROX 35W (STAPLE) ×3 IMPLANT
STOCKINETTE M/LG 89821 (MISCELLANEOUS) ×3 IMPLANT
SUT SILK 2 0 (SUTURE) ×2
SUT SILK 2-0 18XBRD TIE 12 (SUTURE) ×1 IMPLANT
SUT VIC AB 0 CT1 36 (SUTURE) ×12 IMPLANT
SUT VIC AB 3-0 SH 27 (SUTURE) ×4
SUT VIC AB 3-0 SH 27X BRD (SUTURE) ×2 IMPLANT
SUT VICRYL PLUS ABS 0 54 (SUTURE) ×3 IMPLANT
TAPE UMBIL 1/8X18 RADIOPA (MISCELLANEOUS) ×3 IMPLANT
TOWEL OR 17X26 4PK STRL BLUE (TOWEL DISPOSABLE) ×6 IMPLANT

## 2015-05-29 NOTE — Progress Notes (Addendum)
ANTICOAGULATION CONSULT NOTE - Initial Consult  Pharmacy Consult for Lovenox  Indication: VTE prophylaxis  Allergies  Allergen Reactions  . Contrast Media [Iodinated Diagnostic Agents]     Kidneys are operational at 28% and wont use dyes    Patient Measurements: Height: 5' (152.4 cm) Weight: 152 lb (68.947 kg) IBW/kg (Calculated) : 45.5 Heparin Dosing Weight:   Vital Signs: Temp: 97.7 F (36.5 C) (12/21 1721) Temp Source: Oral (12/21 1721) BP: 139/49 mmHg (12/21 1721) Pulse Rate: 86 (12/21 1721)  Labs:  Recent Labs  05/29/15 1341  HGB 10.2*  HCT 33.2*  PLT 367  APTT 38*  LABPROT 21.9*  INR 1.92  CREATININE 1.07*    Estimated Creatinine Clearance: 38.2 mL/min (by C-G formula based on Cr of 1.07).   Medical History: Past Medical History  Diagnosis Date  . Diabetes mellitus without complication ([place])   . C. difficile diarrhea   . Uterine cancer ([place])   . Hypogammaglobulinemia ([place])   . Pulmonary embolism ([place])   . Decubitus ulcers   . Hypertension   . Hypercholesterolemia   . Incontinence of urine   . Cancer [facility])     Endometrial Cancer  . Cervical cancer ([place])     with radiation 45 years ago...  . Arthritis   . GERD (gastroesophageal reflux disease)   . Bladder cancer ([place])   . Decubitus ulcer of buttock, stage 1   . Peripheral vascular disease ([place])   . Anemia   . Coronary artery disease   . CHF (congestive heart failure) ([place])   . Shortness of breath dyspnea   . Hypoxia   . Gangrene of foot ([place])     right  . Myocardial infarction [facility])     nstemi 12/16  . Chronic kidney disease     acute on chronic renal failure 12/16  . Dementia     Medications:  Prescriptions prior to admission  Medication Sig Dispense Refill Last Dose  . ALPRAZolam (XANAX) 0.5 MG tablet Take 1 tablet (0.5 mg total) by mouth 2 (two) times daily as needed for anxiety. 20 tablet 0 05/29/2015 at Unknown time  . amLODipine (NORVASC) 5 MG tablet TAKE 1 TABLET BY MOUTH  EVERY DAY 90 tablet 3 05/28/2015 at Unknown time  . aspirin 81 MG tablet Take 81 mg by mouth daily.   05/28/2015 at Unknown time  . atorvastatin (LIPITOR) 40 MG tablet Take 1 tablet (40 mg total) by mouth daily at 6 PM. 30 tablet 0 05/28/2015 at Unknown time  . docusate sodium (COLACE) 100 MG capsule Take 1 capsule (100 mg total) by mouth 2 (two) times daily. 10 capsule 0 05/28/2015 at Unknown time  . furosemide (LASIX) 20 MG tablet Take 1 tablet (20 mg total) by mouth daily. 30 tablet 0 05/28/2015 at Unknown time  . insulin detemir (LEVEMIR) 100 UNIT/ML injection Inject 0.15 mLs (15 Units total) into the skin at bedtime. 10 mL 11 05/28/2015 at Unknown time  . ketoconazole (NIZORAL) 2 % cream Apply 1 application topically 2 (two) times daily.   05/28/2015 at Unknown time  . magnesium oxide (MAG-OX) 400 MG tablet Take 400 mg by mouth daily.   05/28/2015 at Unknown time  . megestrol (MEGACE ES) 625 MG/5ML suspension Take 1.6 mLs (200 mg total) by mouth daily. to stimulate appetite 150 mL 0 05/28/2015 at Unknown time  . mirtazapine (REMERON) 15 MG tablet Take 15 mg by mouth at bedtime.    05/28/2015 at Unknown time  . mupirocin ointment (  BACTROBAN) 2 %    Taking  . oxyCODONE (ROXICODONE) 15 MG immediate release tablet Take 1 tablet (15 mg total) by mouth every 6 (six) hours as needed for severe pain. 30 tablet 0 05/29/2015 at Unknown time  . promethazine (PHENERGAN) 25 MG tablet Take 25 mg by mouth every 8 (eight) hours as needed for nausea or vomiting.   Taking  . risperiDONE (RISPERDAL) 0.25 MG tablet Take 1 tablet (0.25 mg total) by mouth at bedtime. 10 tablet 0 05/28/2015 at Unknown time  . sertraline (ZOLOFT) 25 MG tablet Take 1 tablet (25 mg total) by mouth daily. 30 tablet 0 05/28/2015 at Unknown time  . tamsulosin (FLOMAX) 0.4 MG CAPS capsule Take 0.4 mg by mouth 2 (two) times daily.   05/28/2015 at Unknown time    Assessment: S/P BKA, pt completed procedure on 12/21 @ ~ 17:00 , CrCl = 38.2  ml/min  Goal of Therapy:  DVT prophylaxis   Plan:  Lovenox 30 mg SQ Q24H originally ordered.  Will adjust dose to lovenox 30 mg SQ Q12H to start 12/22 @ 8:00 based on CrCl > 30 ml/min.   Shanyn Preisler D 05/29/2015,5:25 PM

## 2015-05-29 NOTE — OR Nursing (Signed)
Patient arrived via wheelchair from nursing home.  Foley catheter in place. Draining cloudy urine with sediment. Foley bag replaced with clean bag.  Patient able to identify herself, her dob, why she is here and where she is.  Patient tends to moan often. Bruises easily with slight touch. Right foot black from midfoot to midfoot on back. Above that area, skin is tight, reddened and sloughing. Patient saged over total body. Unhealed wound on left cheekbone. Daughter states that this has not healed for many months. Wound is size of dime and slight depth to it.  There is no drainage but scabbed region present.

## 2015-05-29 NOTE — Anesthesia Procedure Notes (Signed)
Procedure Name: Intubation Date/Time: 05/29/2015 2:49 PM Performed by: Jonna Clark Pre-anesthesia Checklist: Patient identified, Patient being monitored, Timeout performed, Emergency Drugs available and Suction available Patient Re-evaluated:Patient Re-evaluated prior to inductionOxygen Delivery Method: Circle system utilized Preoxygenation: Pre-oxygenation with 100% oxygen Intubation Type: IV induction Ventilation: Mask ventilation without difficulty Laryngoscope Size: Mac and 3 Grade View: Grade II Tube type: Oral Tube size: 7.0 mm Number of attempts: 1 Placement Confirmation: ETT inserted through vocal cords under direct vision,  positive ETCO2 and breath sounds checked- equal and bilateral Secured at: 21 cm Tube secured with: Tape Dental Injury: Teeth and Oropharynx as per pre-operative assessment

## 2015-05-29 NOTE — Op Note (Signed)
   OPERATIVE NOTE   PROCEDURE: right below-the-knee amputation  PRE-OPERATIVE DIAGNOSIS: right gangrene  POST-OPERATIVE DIAGNOSIS: same as above  SURGEON: Hortencia Pilar, MD  ASSISTANT(S): Mr. Travis [place]  ANESTHESIA: general  ESTIMATED BLOOD LOSS: 75 cc  FINDING(S): Gangrenous forefoot muscle bellies at the level of amputation are healthy appearing with good bleeding  SPECIMEN(S):  right below-the-knee amputation  INDICATIONS:   Shelley Fields is a 79 y.o. female who presents with right leg gangrene.  The patient is scheduled for a right below-the-knee amputation.  I discussed in depth with the patient the risks, benefits, and alternatives to this procedure.  The patient is aware that the risk of this operation included but are not limited to:  bleeding, infection, myocardial infarction, stroke, death, failure to heal amputation wound, and possible need for more proximal amputation.  The patient is aware of the risks and agrees proceed forward with the procedure.  DESCRIPTION:  After full informed written consent was obtained from the patient, the patient was brought back to the operating room, and placed supine upon the operating table.  Prior to induction, the patient received IV antibiotics.  The patient was then prepped and draped in the standard fashion for a below-the-knee amputation.  After obtaining adequate anesthesia, the patient was prepped and draped in the standard fashion for a below-the-knee amputation.  I marked out the anterior incision two finger breadths below the tibial tuberosity and then the marked out a posterior flap that was one third of the circumference of the calf in length.   I made the incisions for these flaps, and then dissected through the subcutaneous tissue, fascia, and muscle anteriorly.  I elevated  the periosteal tissue superiorly so that the tibia was about 3-4 cm shorter than the anterior skin flap.  I then transected the tibia with a Gigli  saw.  Then I smoothed out the rough edges.  In a similar fashion, I cut back the fibula about two centimeters higher than the level of the tibia with a bone cutter.  I put a bone hook into the distal tibia and then used a large amputation knife to sharply develop a tissue plane through the muscle along the fibula.  In such fashion, the posterior flap was developed.  At this point, the specimen was passed off the field as the below-the-knee amputation.  At this point, I clamped all visibly bleeding arteries and veins using a combination of suture ligation with Silk suture and electrocautery.  Bleeding continued to be controlled with electrocautery and suture ligature.  The stump was washed off with sterile normal saline and no further active bleeding was noted.  I reapproximated the anterior and posterior fascia  with interrupted stitches of 0 Vicryl.  This was completed along the entire length of anterior and posterior fascia until there were no more loose space in the fascial line. I then placed a layer of 2-0 Vicryl sutures in the subcutaneous tissue. The skin was then  reapproximated with staples.  The stump was washed off and dried.  The incision was dressed with Xeroform and  then fluffs were applied.  Kerlix was wrapped around the leg and then gently an ACE wrap was applied.    COMPLICATIONS: None  CONDITION: Unchanged   Shelley Fields  05/29/2015, 6:01 PM

## 2015-05-29 NOTE — Progress Notes (Signed)
Notified Dr. Ronalee Belts of BP hold BP med tonight will resume tomorrow.

## 2015-05-29 NOTE — Anesthesia Preprocedure Evaluation (Signed)
Anesthesia Evaluation  Patient identified by MRN, date of birth, ID band Patient awake and Patient confused    Reviewed: Allergy & Precautions, H&P , NPO status , Patient's Chart, lab work & pertinent test results, reviewed documented beta blocker date and time   Airway Mallampati: II  TM Distance: >3 FB Neck ROM: full    Dental  (+) Loose, Caps, Chipped, Missing, Poor Dentition   Pulmonary neg pulmonary ROS, shortness of breath and with exertion, PE PE Hx   Pulmonary exam normal breath sounds clear to auscultation       Cardiovascular hypertension, Pt. on medications + CAD, + Past MI, + Peripheral Vascular Disease and +CHF  Normal cardiovascular exam Rhythm:regular Rate:Normal     Neuro/Psych PSYCHIATRIC DISORDERS Anxiety Depression dementia negative neurological ROS  negative psych ROS   GI/Hepatic Neg liver ROS, GERD  Medicated and Controlled,  Endo/Other  diabetes, Well Controlled, Type 2, Oral Hypoglycemic Agents  Renal/GU Renal InsufficiencyRenal diseasenegative Renal ROS  Female GU complaint  negative genitourinary   Musculoskeletal  (+) Arthritis , Osteoarthritis,    Abdominal Normal abdominal exam  (+)   Peds negative pediatric ROS (+)  Hematology negative hematology ROS (+) anemia ,   Anesthesia Other Findings Hx of uterine CA...decubitus ulcer...bladder CA...gangrene of foot  Reproductive/Obstetrics negative OB ROS                             Anesthesia Physical  Anesthesia Plan  ASA: III  Anesthesia Plan: General   Post-op Pain Management:    Induction: Intravenous  Airway Management Planned: Oral ETT  Additional Equipment:   Intra-op Plan:   Post-operative Plan: Extubation in OR  Informed Consent: I have reviewed the patients History and Physical, chart, labs and discussed the procedure including the risks, benefits and alternatives for the proposed anesthesia  with the patient or authorized representative who has indicated his/her understanding and acceptance.   Dental Advisory Given  Plan Discussed with: Anesthesiologist, CRNA and Surgeon  Anesthesia Plan Comments: (Consent with patient and with sister over the phone (per patient request).  DNR suspended for procedure per patient and sister.)        Anesthesia Quick Evaluation

## 2015-05-29 NOTE — Transfer of Care (Signed)
Immediate Anesthesia Transfer of Care Note  Patient: Shelley Fields  Procedure(s) Performed: Procedure(s): AMPUTATION BELOW KNEE (Right)  Patient Location: PACU  Anesthesia Type:General  Level of Consciousness: sedated and responds to stimulation  Airway & Oxygen Therapy: Patient Spontanous Breathing and Patient connected to face mask oxygen  Post-op Assessment: Report given to RN and Post -op Vital signs reviewed and stable  Post vital signs: Reviewed and stable  Last Vitals:  Filed Vitals:   05/29/15 1316  BP: 116/74  Pulse: 124  Temp: 35.9 C  Resp: 20    Complications: No apparent anesthesia complications

## 2015-05-30 ENCOUNTER — Inpatient Hospital Stay: Payer: Medicare Other

## 2015-05-30 ENCOUNTER — Encounter: Payer: Self-pay | Admitting: Vascular Surgery

## 2015-05-30 ENCOUNTER — Inpatient Hospital Stay: Payer: Medicare Other | Admitting: Hematology and Oncology

## 2015-05-30 LAB — CBC
HEMATOCRIT: 26.6 % — AB (ref 35.0–47.0)
Hemoglobin: 8.2 g/dL — ABNORMAL LOW (ref 12.0–16.0)
MCH: 26.4 pg (ref 26.0–34.0)
MCHC: 31 g/dL — ABNORMAL LOW (ref 32.0–36.0)
MCV: 85.3 fL (ref 80.0–100.0)
PLATELETS: 277 10*3/uL (ref 150–440)
RBC: 3.12 MIL/uL — AB (ref 3.80–5.20)
RDW: 18.1 % — ABNORMAL HIGH (ref 11.5–14.5)
WBC: 10.3 10*3/uL (ref 3.6–11.0)

## 2015-05-30 LAB — BASIC METABOLIC PANEL
Anion gap: 6 (ref 5–15)
BUN: 33 mg/dL — ABNORMAL HIGH (ref 6–20)
CHLORIDE: 117 mmol/L — AB (ref 101–111)
CO2: 26 mmol/L (ref 22–32)
Calcium: 8 mg/dL — ABNORMAL LOW (ref 8.9–10.3)
Creatinine, Ser: 1.03 mg/dL — ABNORMAL HIGH (ref 0.44–1.00)
GFR, EST AFRICAN AMERICAN: 59 mL/min — AB (ref >60)
GFR, EST NON AFRICAN AMERICAN: 51 mL/min — AB (ref >60)
Glucose, Bld: 171 mg/dL — ABNORMAL HIGH (ref 65–99)
POTASSIUM: 3.7 mmol/L (ref 3.5–5.1)
SODIUM: 149 mmol/L — AB (ref 135–145)

## 2015-05-30 LAB — GLUCOSE, CAPILLARY: Glucose-Capillary: 180 mg/dL — ABNORMAL HIGH (ref 65–99)

## 2015-05-30 MED ORDER — COLLAGENASE 250 UNIT/GM EX OINT
TOPICAL_OINTMENT | Freq: Every day | CUTANEOUS | Status: DC
  Administered 2015-05-30 – 2015-06-07 (×9): via TOPICAL
  Filled 2015-05-30: qty 30

## 2015-05-30 MED ORDER — BOOST / RESOURCE BREEZE PO LIQD
1.0000 | Freq: Three times a day (TID) | ORAL | Status: DC
  Administered 2015-05-30 – 2015-05-31 (×4): 1 via ORAL
  Administered 2015-06-01: 237 mL via ORAL
  Administered 2015-06-01 – 2015-06-07 (×13): 1 via ORAL

## 2015-05-30 MED ORDER — INSULIN ASPART 100 UNIT/ML ~~LOC~~ SOLN
0.0000 [IU] | SUBCUTANEOUS | Status: DC
  Administered 2015-05-30: 3 [IU] via SUBCUTANEOUS
  Administered 2015-05-31: 2 [IU] via SUBCUTANEOUS
  Administered 2015-05-31: 5 [IU] via SUBCUTANEOUS
  Filled 2015-05-30: qty 2
  Filled 2015-05-30: qty 3
  Filled 2015-05-30: qty 5

## 2015-05-30 MED ORDER — SODIUM CHLORIDE 0.9 % IV SOLN
INTRAVENOUS | Status: DC
  Administered 2015-05-30: 19:00:00 via INTRAVENOUS

## 2015-05-30 NOTE — Clinical Social Work Note (Signed)
Clinical Social Work Assessment  Patient Details  Name: Shelley Fields MRN: [id number] Date of Birth: 01-13-1936  Date of referral:  05/30/15               Reason for consult:  Facility Placement                Permission sought to share information with:    Permission granted to share information::     Name::        Agency::     Relationship::     Contact Information:     Housing/Transportation Living arrangements for the past 2 months:  [place] of Information:  Adult Children Patient Interpreter Needed:  None Criminal Activity/Legal Involvement Pertinent to Current Situation/Hospitalization:  No - Comment as needed Significant Relationships:  Adult Children Lives with:    Do you feel safe going back to the place where you live?  Yes Need for family participation in patient care:  Yes (Comment)  Care giving concerns:  None; patient is a resident of [facility].   Social Worker assessment / plan:  Patient readmitted from last week for her planned amputation. CSW spoke with patient's daughter: [name] this morning: [phone] via phone. Ms. Evangeline Dakin confirmed that she wants her mother to return as planned to [facility] but they decided to hold off on hospice services until after patient completes her rehab at [facility].  Employment status:  Retired Nurse, adult PT Recommendations:  Not assessed at this time Information / Referral to community resources:     Patient/Family's Response to care:  Patient's daughter appreciative of [place] phone call.  Patient/Family's Understanding of and Emotional Response to Diagnosis, Current Treatment, and Prognosis:  Patient's daughter vocalizes a definitive plan in reference to her mother's recovery.  Emotional Assessment Appearance:  Appears stated age Attitude/Demeanor/Rapport:  Unable to Assess Affect (typically observed):  Unable to Assess Orientation:  Fluctuating Orientation (Suspected and/or  reported [place]) Alcohol / Substance use:  Not Applicable Psych involvement (Current and /or in the community):  No (Comment)  Discharge Needs  Concerns to be addressed:  Care Coordination Readmission within the last 30 days:  Yes Current discharge risk:  None Barriers to Discharge:  No Barriers Identified   Shela Leff, LCSW 05/30/2015, 10:19 AM

## 2015-05-30 NOTE — Progress Notes (Signed)
Initial Nutrition Assessment  DOCUMENTATION CODES:   Non-severe (moderate) malnutrition in context of chronic illness  INTERVENTION:   Meals and Snacks: recommend downgrading to Dysphagia III diet Medical Food Supplement Therapy: recommend addition of Mighty Shakes, Magic Cups Nutrition related medication management: if po intake remains inadequate, recommend addition of MVI Feeding Assistance: pt requires feeding assistance and encouragement at all meal times   NUTRITION DIAGNOSIS:   Inadequate oral intake related to poor appetite as evidenced by meal completion < 25%.  GOAL:   Patient will meet greater than or equal to 90% of their needs  MONITOR:    (Energy Intake, Anthropometrics, Digestive System, Electrolyte/Renal Profile)  REASON FOR ASSESSMENT:   Consult  (stage IV pressure ulcer)  ASSESSMENT:    Pt s/p right BKA on 12/21. Pt alert, confused  Past Medical History  Diagnosis Date  . Diabetes mellitus without complication ([place])   . C. difficile diarrhea   . Uterine cancer ([place])   . Hypogammaglobulinemia ([place])   . Pulmonary embolism ([place])   . Decubitus ulcers   . Hypertension   . Hypercholesterolemia   . Incontinence of urine   . Cancer [facility])     Endometrial Cancer  . Cervical cancer ([place])     with radiation 45 years ago...  . Arthritis   . GERD (gastroesophageal reflux disease)   . Bladder cancer ([place])   . Decubitus ulcer of buttock, stage 1   . Peripheral vascular disease ([place])   . Anemia   . Coronary artery disease   . CHF (congestive heart failure) ([place])   . Shortness of breath dyspnea   . Hypoxia   . Gangrene of foot ([place])     right  . Myocardial infarction [facility])     nstemi 12/16  . Chronic kidney disease     acute on chronic renal failure 12/16  . Dementia     Diet Order: Heart Healthy/Carb Modified  Energy Intake: no recorded po intake, pt with untouched meal tray on visit after lunch today  Food and nutrition related history:  unable to assess; on recent admission, pt with poor intake, eating mostly 25% of meals  Skin:   stage IV sacral/bilateral buttock pressure ulcer 7X10 cm, worsened since earlier admission this month per wound care notes; right BKA this admission  Electrolyte and Renal Profile:  Recent Labs Lab 05/24/15 0354 05/29/15 1341 05/30/15 0503  BUN 66* 35* 33*  CREATININE 1.68* 1.07* 1.03*  NA 143 147* 149*  K 4.1 4.1 3.7   Glucose Profile:   Recent Labs  05/29/15 1259 05/29/15 1604  GLUCAP 202* 237*   Meds: D5-NS at 75 ml/hr, levemir, lasix, remeron  Nutrition Focused Physical Exam: Nutrition-Focused physical exam completed. Findings are mild to moderate fat depletion, mild to severe muscle depletion, and mild edema.   Height:   Ht Readings from Last 1 Encounters:  05/29/15 5' (1.524 m)    Weight:   Wt Readings from Last 1 Encounters:  05/29/15 152 lb (68.947 kg)    Wt Readings from Last 10 Encounters:  05/29/15 152 lb (68.947 kg)  05/15/15 152 lb 11.2 oz (69.264 kg)  05/14/15 156 lb (70.761 kg)  04/05/15 156 lb (70.761 kg)  02/12/15 156 lb 6.4 oz (70.943 kg)  12/17/14 164 lb 4.8 oz (74.526 kg)  11/30/14 156 lb 6.7 oz (70.95 kg)  11/23/14 151 lb 14.4 oz (68.9 kg)    BMI:  Body mass index is 29.69 kg/(m^2).  Estimated Nutritional Needs:  Kcal:  [id number] kcals (BEE 1096, 1.2 AF, 1.2-1.4 IF)   Protein:  83-104 g (1.2-1.5 g/kg)   Fluid:  1725-2070 mL (25-30 ml/kg)     HIGH Care Level  Kerman Passey MS, RD, LDN [phone] Pager  [phone] Weekend/On-Call Pager

## 2015-05-30 NOTE — Progress Notes (Signed)
Inpatient Diabetes Program Recommendations  AACE/ADA: New Consensus Statement on Inpatient Glycemic Control (2015)  Target Ranges:  Prepandial:   less than 140 mg/dL      Peak postprandial:   less than 180 mg/dL (1-2 hours)      Critically ill patients:  140 - 180 mg/dL   Review of Glycemic Control  Results for Shelley, Fields (MRN [id number]) as of 05/30/2015 12:06  Ref. Range 05/29/2015 12:59 05/29/2015 16:04  Glucose-Capillary Latest Ref Range: 65-99 mg/dL 202 (H) 237 (H)    Diabetes history: Type 2 Outpatient Diabetes medications: Levemir 15 units qhs Current orders for Inpatient glycemic control: Levemir 15 units qhs  Inpatient Diabetes Program Recommendations: Please consider ordering Novolog sensitive correction scale 0-9 units tid and Novolog 0-5 units qhs.  Please order CBG tid and hs.  Gentry Fitz, RN, BA, MHA, CDE Diabetes Coordinator Inpatient Diabetes Program  [phone] (Team Pager) [phone] ([place]) 05/30/2015 12:08 PM

## 2015-05-30 NOTE — Care Management (Signed)
Anticipate discharge bach to SNF, CSW following.

## 2015-05-30 NOTE — Consult Note (Addendum)
WOC wound consult note Pt familiar to [place] team from recent admission; refer to progress notes on 12/7.  Pt has been in a SNF and wounds have declined at this time.  Daughter at the bedside to assess wound appearance and discuss plan of care. Vascular team following for assessment and plan of care to right stump amputation site. Reason for Consult: Pressure injury to sacrum and across bilat buttocks, present on admission.  Pressure Ulcer POA: Yes Measurement: Sacrum/bilat buttocks pressure injury is 7X10cm; 50% of wound has evolved into a stage 4 pressure injury; 3X4X.4cm in the center of the affected area; yellow wound bed, bone palpable when probed with swab.   25% of area is dark purple deep tissue injury to outer edges, which has not evolved at this time.  25% of inner wound bed has evolved into stage 3 pressure injury, red and moist, small amt pink drainage, no odor. Dressing procedure/placement/frequency: Air mattress overlay ordered for bed to reduce pressure to sacrum/buttocks.  Nutrition consult to optimize protein intake.  Santyl ointment to provide enzymatic debridement of nonviable tissue.  It is difficult to keep wound from becoming soiled related to patient is frequently incontinent of stool and wound is located in close proximity to rectum. Discussed plan of care with patient's daughter. She denies further questions at this time. Please re-consult if further assistance is needed.  Thank-you,  Julien Girt MSN, [place], [place], [place], [place]

## 2015-05-30 NOTE — Progress Notes (Signed)
[facility]  Daily Progress Note   Subjective  - 1 Day Post-Op  Has had periods of alertness and was talking with family. Family is quite pleased.  Objective Filed Vitals:   05/29/15 1909 05/29/15 2046 05/30/15 0554 05/30/15 1112  BP: 143/52 159/60 165/47 169/63  Pulse: 86 112 103   Temp: 98.2 F (36.8 C) 98.2 F (36.8 C) 98.7 F (37.1 C)   TempSrc: Oral Oral Oral   Resp: 18 20 16    Height:      Weight:      SpO2: 98% 100% 100%     Intake/Output Summary (Last 24 hours) at 05/30/15 1825 Last data filed at 05/30/15 1715  Gross per 24 hour  Intake   1705 ml  Output    950 ml  Net    755 ml    PULM  Normal effort , no use of accessory muscles CV  No JVD, RRR Abd      No distended, nontender VASC  dressing clean dry and intact  Laboratory CBC    Component Value Date/Time   WBC 10.3 05/30/2015 0503   WBC 9.7 10/03/2014 1030   HGB 8.2* 05/30/2015 0503   HGB 9.5* 10/03/2014 1030   HCT 26.6* 05/30/2015 0503   HCT 30.7* 10/03/2014 1030   PLT 277 05/30/2015 0503   PLT 325 10/03/2014 1030    BMET    Component Value Date/Time   NA 149* 05/30/2015 0503   NA 136 07/13/2014 1022   K 3.7 05/30/2015 0503   K 4.8 07/13/2014 1022   CL 117* 05/30/2015 0503   CL 101 07/13/2014 1022   CO2 26 05/30/2015 0503   CO2 26 07/13/2014 1022   GLUCOSE 171* 05/30/2015 0503   GLUCOSE 218* 07/13/2014 1022   BUN 33* 05/30/2015 0503   BUN 27* 09/04/2014 1113   CREATININE 1.03* 05/30/2015 0503   CREATININE 1.20* 10/03/2014 1030   CALCIUM 8.0* 05/30/2015 0503   CALCIUM 8.2* 07/13/2014 1022   GFRNONAA 51* 05/30/2015 0503   GFRNONAA 44* 10/03/2014 1030   GFRNONAA 31* 07/13/2014 1022   GFRAA 59* 05/30/2015 0503   GFRAA 50* 10/03/2014 1030   GFRAA 37* 07/13/2014 1022    Assessment/Planning: POD #1 s/p right BKA   I will reinstate the DO NOT RESUSCITATE adjust her IV fluids I discussed pain medication with family as well as nursing. We will begin  supplementing by mouth. Continue dressing for now    Katha Cabal  05/30/2015, 6:25 PM

## 2015-05-30 NOTE — Plan of Care (Signed)
Problem: Education: Goal: Knowledge of [facility] Education information/materials will improve Outcome: Not Progressing Pt confused  Problem: Health Behavior/Discharge Planning: Goal: Ability to manage health-related needs will improve Outcome: Not Progressing Pt confused

## 2015-05-30 NOTE — NC FL2 (Signed)
[place] LEVEL OF CARE SCREENING TOOL     IDENTIFICATION  Patient Name: Shelley Fields Birthdate: Dec 13, 1935 Sex: female Admission Date (Current Location): 05/29/2015  [place] Number:  Engineering geologist and Address:  [facility], [address] [place], [place] [address]      Provider Number: [id number]  Attending Physician Name and Address:  Katha Cabal, MD  Relative Name and Phone Number:       Current Level of Care: Hospital Recommended Level of Care: [place] Prior Approval Number:    Date Approved/Denied:   PASRR Number:    Discharge Plan: SNF    Current Diagnoses: Patient Active Problem List   Diagnosis Date Noted  . Atherosclerosis of aorta with gangrene ([place]) 05/29/2015  . Pressure ulcer 05/16/2015  . NSTEMI (non-ST elevated myocardial infarction) ([place]) 05/15/2015  . CHF (congestive heart failure) ([place]) 05/15/2015  . Chronic anticoagulation 04/21/2015  . B12 deficiency 03/18/2015  . Anemia 02/17/2015  . Weight loss 02/17/2015  . Severe anemia 12/12/2014  . GI bleed 12/12/2014  . DM (diabetes mellitus), type 2, uncontrolled ([place]) 12/12/2014  . Diabetes mellitus with hyperglycemia ([place]) 12/12/2014  . Leucocytosis 12/12/2014  . UTI (urinary tract infection) 12/12/2014  . Decubitus ulcer of back, stage 2 12/12/2014  . HTN (hypertension) 12/12/2014  . Anorexia 11/23/2014  . Encounter for other specified administrative purpose 07/10/2013  . Referral of patient 07/10/2013  . Candidal vulvovaginitis 10/14/2012  . Candida vaginitis 10/14/2012  . Polypharmacy 06/23/2012  . Long term current use of opiate analgesic 06/23/2012  . Breast lump 06/10/2012  . Laboratory examination 04/12/2012  . Diagnostic skin and sensitization tests 04/12/2012  . Bladder infection, chronic 03/23/2012  . Total urinary incontinence 03/23/2012  . 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 Female genuine stress  incontinence 03/23/2012  . Disorder of bladder function 03/23/2012  . Incomplete bladder emptying 03/23/2012  . 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 Cystitis, radiation 03/23/2012  . Cancer of body of uterus ([place]) 03/23/2012  . Cancer of corpus uteri, except isthmus ([place]) 03/23/2012  . Bladder neoplasm of uncertain malignant potential 03/23/2012  . Infection of urinary tract 03/23/2012  . Colon polyp 01/06/2012  . Difficulty in walking 01/06/2012  . Diabetes ([place]) 01/06/2012  . Deep vein thrombosis ([place]) 01/06/2012  . Cancer of endometrium ([place]) 01/06/2012  . Contact with and exposure to tuberculosis 01/06/2012  . BP (high blood pressure) 01/06/2012  . Angiopathy, peripheral ([place]) 01/06/2012  . Abnormal presence of protein in urine 01/06/2012  . Retinopathy 01/06/2012    Orientation RESPIRATION BLADDER Height & Weight         Continent   152 lbs.  BEHAVIORAL SYMPTOMS/MOOD NEUROLOGICAL BOWEL NUTRITION STATUS     (none) Continent Diet  AMBULATORY STATUS COMMUNICATION OF NEEDS Skin   Total Care Verbally Surgical wounds                       Personal Care Assistance Level of Assistance  Total care Bathing Assistance: Maximum assistance   Dressing Assistance: Maximum assistance Total Care Assistance: Maximum assistance   Functional Limitations Info             SPECIAL CARE FACTORS FREQUENCY                       Contractures Contractures Info: Not present    Additional Factors Info  Isolation Precautions  Current Medications (05/30/2015):  This is the current hospital active medication list Current Facility-Administered Medications  Medication Dose Route Frequency Provider Last Rate Last Dose  . acetaminophen (TYLENOL) tablet 650 mg  650 mg Oral Q6H PRN Katha Cabal, MD       Or  . acetaminophen (TYLENOL) suppository 650 mg  650 mg Rectal Q6H PRN Katha Cabal, MD      . ALPRAZolam Duanne Moron) tablet 0.5 mg  0.5 mg  Oral BID PRN Katha Cabal, MD      . alum & mag hydroxide-simeth (MAALOX/MYLANTA) 200-200-20 MG/5ML suspension 30 mL  30 mL Oral Q6H PRN Katha Cabal, MD      . amLODipine (NORVASC) tablet 5 mg  5 mg Oral Daily Katha Cabal, MD   5 mg at 05/29/15 1630  . aspirin EC tablet 81 mg  81 mg Oral Daily Katha Cabal, MD   81 mg at 05/29/15 1800  . atorvastatin (LIPITOR) tablet 40 mg  40 mg Oral q1800 Katha Cabal, MD   40 mg at 05/29/15 1801  . dextrose 5 %-0.9 % sodium chloride infusion   Intravenous Continuous Katha Cabal, MD 75 mL/hr at 05/30/15 0555    . docusate sodium (COLACE) capsule 100 mg  100 mg Oral BID Katha Cabal, MD   100 mg at 05/29/15 2244  . enoxaparin (LOVENOX) injection 30 mg  30 mg Subcutaneous Q12H Katha Cabal, MD      . furosemide (LASIX) tablet 20 mg  20 mg Oral Daily Katha Cabal, MD   20 mg at 05/29/15 1802  . HYDROcodone-acetaminophen (NORCO/VICODIN) 5-325 MG per tablet 1-2 tablet  1-2 tablet Oral Q4H PRN Katha Cabal, MD      . insulin detemir (LEVEMIR) injection 15 Units  15 Units Subcutaneous QHS Katha Cabal, MD   15 Units at 05/29/15 2112  . magnesium hydroxide (MILK OF MAGNESIA) suspension 30 mL  30 mL Oral Daily PRN Katha Cabal, MD      . magnesium oxide (MAG-OX) tablet 400 mg  400 mg Oral Daily Katha Cabal, MD   400 mg at 05/29/15 1802  . mirtazapine (REMERON) tablet 15 mg  15 mg Oral QHS Katha Cabal, MD   15 mg at 05/29/15 2243  . morphine 2 MG/ML injection 2 mg  2 mg Intravenous Q1H PRN Katha Cabal, MD   2 mg at 05/29/15 2107  . ondansetron (ZOFRAN) tablet 4 mg  4 mg Oral Q6H PRN Katha Cabal, MD       Or  . ondansetron [facility]) injection 4 mg  4 mg Intravenous Q6H PRN Katha Cabal, MD      . oxyCODONE (Oxy IR/ROXICODONE) immediate release tablet 15 mg  15 mg Oral Q6H PRN Katha Cabal, MD   15 mg at 05/29/15 2244  . promethazine (PHENERGAN) tablet 25 mg  25 mg Oral Q8H PRN  Katha Cabal, MD      . risperiDONE (RISPERDAL) tablet 0.25 mg  0.25 mg Oral QHS Katha Cabal, MD   0.25 mg at 05/29/15 2243  . sertraline (ZOLOFT) tablet 25 mg  25 mg Oral Daily Katha Cabal, MD   25 mg at 05/29/15 1802  . sodium chloride 0.9 % injection 3 mL  3 mL Intravenous Q12H Katha Cabal, MD   3 mL at 05/29/15 2115  . sodium phosphate (FLEET) 7-19 GM/118ML enema 1 enema  1 enema  Rectal Once PRN Katha Cabal, MD      . sorbitol 70 % solution 30 mL  30 mL Oral Daily PRN Katha Cabal, MD      . tamsulosin [facility]) capsule 0.4 mg  0.4 mg Oral BID Katha Cabal, MD   0.4 mg at 05/29/15 2243   Facility-Administered Medications Ordered in Other Encounters  Medication Dose Route Frequency Provider Last Rate Last Dose  . sodium chloride 0.9 % injection 10 mL  10 mL Intravenous PRN Evlyn Kanner, NP   10 mL at 11/02/14 [id number]     Discharge Medications: Please see discharge summary for a list of discharge medications.  Relevant Imaging Results:  Relevant Lab Results:   Additional Information    Shela Leff, LCSW

## 2015-05-31 ENCOUNTER — Other Ambulatory Visit: Payer: Self-pay | Admitting: *Deleted

## 2015-05-31 DIAGNOSIS — E440 Moderate protein-calorie malnutrition: Secondary | ICD-10-CM | POA: Diagnosis present

## 2015-05-31 LAB — GLUCOSE, CAPILLARY
GLUCOSE-CAPILLARY: 106 mg/dL — AB (ref 65–99)
GLUCOSE-CAPILLARY: 62 mg/dL — AB (ref 65–99)
GLUCOSE-CAPILLARY: 242 mg/dL — AB (ref 65–99)
GLUCOSE-CAPILLARY: 167 mg/dL — AB (ref 65–99)
Glucose-Capillary: 125 mg/dL — ABNORMAL HIGH (ref 65–99)
Glucose-Capillary: 131 mg/dL — ABNORMAL HIGH (ref 65–99)
Glucose-Capillary: 133 mg/dL — ABNORMAL HIGH (ref 65–99)
Glucose-Capillary: 168 mg/dL — ABNORMAL HIGH (ref 65–99)
Glucose-Capillary: 231 mg/dL — ABNORMAL HIGH (ref 65–99)
Glucose-Capillary: 49 mg/dL — ABNORMAL LOW (ref 65–99)
Glucose-Capillary: 51 mg/dL — ABNORMAL LOW (ref 65–99)

## 2015-05-31 LAB — SURGICAL PATHOLOGY

## 2015-05-31 MED ORDER — DEXTROSE 5 % IV SOLN
INTRAVENOUS | Status: DC
  Administered 2015-05-31 – 2015-06-01 (×2): via INTRAVENOUS
  Administered 2015-06-01: 10000 mL via INTRAVENOUS
  Administered 2015-06-02: 07:00:00 via INTRAVENOUS

## 2015-05-31 MED ORDER — DOCUSATE SODIUM 50 MG/5ML PO LIQD
100.0000 mg | Freq: Two times a day (BID) | ORAL | Status: DC
  Administered 2015-05-31 – 2015-06-07 (×13): 100 mg via ORAL
  Filled 2015-05-31 (×15): qty 10

## 2015-05-31 MED ORDER — OXYCODONE HCL 5 MG PO TABS
10.0000 mg | ORAL_TABLET | Freq: Four times a day (QID) | ORAL | Status: DC | PRN
  Administered 2015-05-31: 10 mg via ORAL
  Filled 2015-05-31: qty 2

## 2015-05-31 MED ORDER — INSULIN ASPART 100 UNIT/ML ~~LOC~~ SOLN
0.0000 [IU] | Freq: Three times a day (TID) | SUBCUTANEOUS | Status: DC
  Administered 2015-06-01: 1 [IU] via SUBCUTANEOUS
  Administered 2015-06-01: 7 [IU] via SUBCUTANEOUS
  Administered 2015-06-01: 2 [IU] via SUBCUTANEOUS
  Administered 2015-06-02: 1 [IU] via SUBCUTANEOUS
  Administered 2015-06-02: 3 [IU] via SUBCUTANEOUS
  Administered 2015-06-02: 2 [IU] via SUBCUTANEOUS
  Administered 2015-06-03: 1 [IU] via SUBCUTANEOUS
  Administered 2015-06-03: 5 [IU] via SUBCUTANEOUS
  Administered 2015-06-03 – 2015-06-04 (×3): 2 [IU] via SUBCUTANEOUS
  Administered 2015-06-05: 1 [IU] via SUBCUTANEOUS
  Administered 2015-06-06: 2 [IU] via SUBCUTANEOUS
  Administered 2015-06-06: 3 [IU] via SUBCUTANEOUS
  Administered 2015-06-06 – 2015-06-07 (×2): 2 [IU] via SUBCUTANEOUS
  Filled 2015-05-31: qty 2
  Filled 2015-05-31: qty 5
  Filled 2015-05-31: qty 2
  Filled 2015-05-31: qty 3
  Filled 2015-05-31 (×2): qty 2
  Filled 2015-05-31: qty 7
  Filled 2015-05-31 (×2): qty 1
  Filled 2015-05-31 (×2): qty 2
  Filled 2015-05-31: qty 1
  Filled 2015-05-31: qty 2
  Filled 2015-05-31: qty 1
  Filled 2015-05-31 (×2): qty 2

## 2015-05-31 MED ORDER — DEXTROSE 50 % IV SOLN
INTRAVENOUS | Status: AC
  Filled 2015-05-31: qty 50

## 2015-05-31 MED ORDER — INSULIN ASPART 100 UNIT/ML ~~LOC~~ SOLN
0.0000 [IU] | Freq: Every day | SUBCUTANEOUS | Status: DC
Start: 2015-05-31 — End: 2015-06-07
  Administered 2015-06-01 – 2015-06-05 (×3): 3 [IU] via SUBCUTANEOUS
  Filled 2015-05-31 (×3): qty 3

## 2015-05-31 MED ORDER — INSULIN ASPART 100 UNIT/ML ~~LOC~~ SOLN
0.0000 [IU] | Freq: Three times a day (TID) | SUBCUTANEOUS | Status: DC
  Administered 2015-05-31: 5 [IU] via SUBCUTANEOUS
  Filled 2015-05-31: qty 5

## 2015-05-31 MED ORDER — DEXTROSE 50 % IV SOLN
1.0000 | Freq: Once | INTRAVENOUS | Status: AC
  Administered 2015-05-31: 50 mL via INTRAVENOUS

## 2015-05-31 MED ORDER — INSULIN ASPART 100 UNIT/ML ~~LOC~~ SOLN: 0.0000 [IU] | Freq: Three times a day (TID) | SUBCUTANEOUS | Status: DC

## 2015-05-31 MED ORDER — DEXTROSE 50 % IV SOLN
1.0000 | Freq: Once | INTRAVENOUS | Status: AC
  Administered 2015-05-31: 50 mL via INTRAVENOUS
  Filled 2015-05-31: qty 50

## 2015-05-31 NOTE — Progress Notes (Signed)
Pt. Blood sugar back down to 60 after amp of D 50. MD notified and another ampule ordered. NS at 50 mls DC'ed and Dextrose regular at 60 ordered. Will continue to monitor BS Q 15 mins.

## 2015-05-31 NOTE — Care Management Important Message (Signed)
Important Message  Patient Details  Name: Shelley Fields MRN: [id number] Date of Birth: 21-Apr-1936   Medicare Important Message Given:  Yes    Meg Niemeier A, RN 05/31/2015, 8:34 AM

## 2015-05-31 NOTE — Consult Note (Signed)
   [facility] CM Inpatient Consult   05/31/2015  Shelley Fields 05/22/1936 [id number]   EPIC referral received for [facility] Care Management services. Noted patient is from SNF and the plan is to return to SNF at discharge per notes. Will not engage for [facility] Care Management at this time.   Marthenia Rolling, MSN-Ed, RN,BSN [facility] Liaison [phone]

## 2015-05-31 NOTE — Progress Notes (Addendum)
Inpatient Diabetes Program Recommendations  AACE/ADA: New Consensus Statement on Inpatient Glycemic Control (2015)  Target Ranges:  Prepandial:   less than 140 mg/dL      Peak postprandial:   less than 180 mg/dL (1-2 hours)      Critically ill patients:  140 - 180 mg/dL   Review of Glycemic Control  Results for Shelley, Fields (MRN [id number]) as of 05/31/2015 09:42  Ref. Range 05/29/2015 12:59 05/29/2015 16:04 05/30/2015 22:32 05/31/2015 00:16 05/31/2015 03:54 05/31/2015 03:59 05/31/2015 04:38 05/31/2015 05:02 05/31/2015 06:11  Glucose-Capillary Latest Ref Range: 65-99 mg/dL 202 (H) 237 (H) 180 (H) 168 (H) 49 (L) 51 (L) 125 (H) 106 (H) 62 (L)    Diabetes history: Type 2 Outpatient Diabetes medications: Levemir 15 units qhs Current orders for Inpatient glycemic control: Levemir 15 units qhs, Novolog 0-15 units q4h  Inpatient Diabetes Program Recommendations: Please consider changing Novolog q4h to Novolog sensitive sensitive correction scale 0-9 units tid and Novolog 0-5 units qhs.  Consider decreasing the Levemir to 8 units qhs.  Gentry Fitz, RN, BA, MHA, CDE Diabetes Coordinator Inpatient Diabetes Program  [phone] (Team Pager) [phone] ([place]) 05/31/2015 10:00 AM

## 2015-05-31 NOTE — H&P (Signed)
[place] [facility] [place] NAME: Shelley Fields    MR#:  [id number]  DATE OF BIRTH:  05/31/1936  DATE OF ADMISSION:  05/29/2015  PRIMARY CARE PHYSICIAN: [name], MD   REQUESTING/REFERRING PHYSICIAN: Delana Meyer, MD  CHIEF COMPLAINT:  No chief complaint on file.   HISTORY OF PRESENT ILLNESS:  Shelley Fields  is a 79 y.o. female who presents with right lower extremity gangrene with subsequent RBKA.  Hospitalists consulted to co-manage diabetes and other comorbidities.    PAST MEDICAL HISTORY:   Past Medical History  Diagnosis Date  . Diabetes mellitus without complication ([place])   . C. difficile diarrhea   . Uterine cancer ([place])   . Hypogammaglobulinemia ([place])   . Pulmonary embolism ([place])   . Decubitus ulcers   . Hypertension   . Hypercholesterolemia   . Incontinence of urine   . Cancer [facility])     Endometrial Cancer  . Cervical cancer ([place])     with radiation 45 years ago...  . Arthritis   . GERD (gastroesophageal reflux disease)   . Bladder cancer ([place])   . Decubitus ulcer of buttock, stage 1   . Peripheral vascular disease ([place])   . Anemia   . Coronary artery disease   . CHF (congestive heart failure) ([place])   . Shortness of breath dyspnea   . Hypoxia   . Gangrene of foot ([place])     right  . Myocardial infarction [facility])     nstemi 12/16  . Chronic kidney disease     acute on chronic renal failure 12/16  . Dementia     PAST SURGICAL HISTOIRY:   Past Surgical History  Procedure Laterality Date  . Total abdominal hysterectomy w/ bilateral salpingoophorectomy  07/29/2010  . Stent in leg  2016  . Cataract extraction, bilateral    . Bladder surgery    . Femoral endarterectomy      right  . Leg surgery      right leg  . Portacath placement      right  . Esophagogastroduodenoscopy N/A 12/14/2014    Procedure: ESOPHAGOGASTRODUODENOSCOPY (EGD);  Surgeon: Hulen Luster, MD;  Location: [facility] ENDOSCOPY;  Service: Endoscopy;   Laterality: N/A;  . Peripheral vascular catheterization Right 04/05/2015    Procedure: Lower Extremity Angiography;  Surgeon: Katha Cabal, MD;  Location: [facility] CV LAB;  Service: Cardiovascular;  Laterality: Right;  . Peripheral vascular catheterization  04/05/2015    Procedure: Lower Extremity Intervention;  Surgeon: Katha Cabal, MD;  Location: [place] CV LAB;  Service: Cardiovascular;;  . Peripheral vascular catheterization Right 05/14/2015    Procedure: Lower Extremity Angiography;  Surgeon: Katha Cabal, MD;  Location: [place] CV LAB;  Service: Cardiovascular;  Laterality: Right;  . Peripheral vascular catheterization Right 05/14/2015    Procedure: Lower Extremity Intervention;  Surgeon: Katha Cabal, MD;  Location: [place] CV LAB;  Service: Cardiovascular;  Laterality: Right;  . Angioplasty    . Amputation Right 05/29/2015    Procedure: AMPUTATION BELOW KNEE;  Surgeon: Katha Cabal, MD;  Location: [facility] ORS;  Service: Vascular;  Laterality: Right;    SOCIAL HISTORY:   Social History  Substance Use Topics  . Smoking status: Never Smoker   . Smokeless tobacco: Not on file  . Alcohol Use: No    FAMILY HISTORY:   Family History  Problem Relation Age of Onset  . Hypertension Mother   . Diabetes Mellitus II  Mother   . Coronary artery disease Mother   . Hypertension Father   . Diabetes Mellitus II Father   . CAD Father   . Diabetes Mellitus II Sister   . Hypertension Sister   . Diabetes Mellitus II Brother   . Hypertension Brother   . Cancer Brother     DRUG ALLERGIES:   Allergies  Allergen Reactions  . Contrast Media [Iodinated Diagnostic Agents]     Kidneys are operational at 28% and wont use dyes    REVIEW OF SYSTEMS:  Review of Systems  Unable to perform ROS: dementia    MEDICATIONS AT HOME:   Prior to Admission medications   Medication Sig Start Date End Date Taking? Authorizing Provider  ALPRAZolam Duanne Moron)  0.5 MG tablet Take 1 tablet (0.5 mg total) by mouth 2 (two) times daily as needed for anxiety. 05/24/15  Yes Vaughan Basta, MD  amLODipine (NORVASC) 5 MG tablet TAKE 1 TABLET BY MOUTH EVERY DAY 02/12/15  Yes Arlis Porta., MD  aspirin 81 MG tablet Take 81 mg by mouth daily.   Yes Historical Provider, MD  atorvastatin (LIPITOR) 40 MG tablet Take 1 tablet (40 mg total) by mouth daily at 6 PM. 05/24/15  Yes Vaughan Basta, MD  docusate sodium (COLACE) 100 MG capsule Take 1 capsule (100 mg total) by mouth 2 (two) times daily. 05/24/15  Yes Vaughan Basta, MD  furosemide (LASIX) 20 MG tablet Take 1 tablet (20 mg total) by mouth daily. 05/24/15  Yes Vaughan Basta, MD  insulin detemir (LEVEMIR) 100 UNIT/ML injection Inject 0.15 mLs (15 Units total) into the skin at bedtime. 05/24/15  Yes Vaughan Basta, MD  ketoconazole (NIZORAL) 2 % cream Apply 1 application topically 2 (two) times daily.   Yes Historical Provider, MD  magnesium oxide (MAG-OX) 400 MG tablet Take 400 mg by mouth daily.   Yes Historical Provider, MD  megestrol (MEGACE ES) 625 MG/5ML suspension Take 1.6 mLs (200 mg total) by mouth daily. to stimulate appetite 11/23/14  Yes Lequita Asal, MD  mirtazapine (REMERON) 15 MG tablet Take 15 mg by mouth at bedtime.  10/24/14  Yes Historical Provider, MD  mupirocin ointment (BACTROBAN) 2 %  01/14/15  Yes Historical Provider, MD  oxyCODONE (ROXICODONE) 15 MG immediate release tablet Take 1 tablet (15 mg total) by mouth every 6 (six) hours as needed for severe pain. 05/24/15  Yes Vaughan Basta, MD  promethazine (PHENERGAN) 25 MG tablet Take 25 mg by mouth every 8 (eight) hours as needed for nausea or vomiting.   Yes Historical Provider, MD  risperiDONE (RISPERDAL) 0.25 MG tablet Take 1 tablet (0.25 mg total) by mouth at bedtime. 05/24/15  Yes Vaughan Basta, MD  sertraline (ZOLOFT) 25 MG tablet Take 1 tablet (25 mg total) by mouth daily. 05/24/15   Yes Vaughan Basta, MD  tamsulosin (FLOMAX) 0.4 MG CAPS capsule Take 0.4 mg by mouth 2 (two) times daily. 10/24/14  Yes Historical Provider, MD      VITAL SIGNS:   Filed Vitals:   05/30/15 1228 05/30/15 2225 05/31/15 0619 05/31/15 1254  BP: 144/93 174/51 168/51 151/36  Pulse: 107 91 92 84  Temp: 97.2 F (36.2 C) 99.5 F (37.5 C) 98.1 F (36.7 C) 98.8 F (37.1 C)  TempSrc: Oral Oral Oral Oral  Resp:  24 16 18   Height:      Weight:   65.862 kg (145 lb 3.2 oz)   SpO2: 98% 100% 100% 99%   Wt Readings from Last  3 Encounters:  05/31/15 65.862 kg (145 lb 3.2 oz)  05/15/15 69.264 kg (152 lb 11.2 oz)  05/14/15 70.761 kg (156 lb)    PHYSICAL EXAMINATION:  Physical Exam  Vitals reviewed. Constitutional: She appears well-developed and well-nourished. No distress.  HENT:  Head: Normocephalic and atraumatic.  Mouth/Throat: Oropharynx is clear and moist.  Eyes: Conjunctivae and EOM are normal. Pupils are equal, round, and reactive to light. No scleral icterus.  Neck: Normal range of motion. Neck supple. No JVD present. No thyromegaly present.  Cardiovascular: Normal rate, regular rhythm and intact distal pulses.  Exam reveals no gallop and no friction rub.   No murmur heard. Respiratory: Effort normal and breath sounds normal. No respiratory distress. She has no wheezes. She has no rales.  GI: Soft. Bowel sounds are normal. She exhibits no distension. There is no tenderness.  Musculoskeletal: Normal range of motion. She exhibits no edema.  Right BKA, bandage in place, clean and dry.  No arthritis, no gout  Lymphadenopathy:    She has no cervical adenopathy.  Neurological: She is alert. No cranial nerve deficit.  Unable to fully assess due to dementia.    Skin: Skin is warm and dry. No rash noted. No erythema.  Decubitus pressure ulcer  Psychiatric:  Unable to assess due to dementia     LABORATORY PANEL:   CBC  Recent Labs Lab 05/30/15 0503  WBC 10.3  HGB 8.2*  HCT  26.6*  PLT 277   ------------------------------------------------------------------------------------------------------------------  Chemistries   Recent Labs Lab 05/30/15 0503  NA 149*  K 3.7  CL 117*  CO2 26  GLUCOSE 171*  BUN 33*  CREATININE 1.03*  CALCIUM 8.0*   ------------------------------------------------------------------------------------------------------------------  Cardiac Enzymes No results for input(s): TROPONINI in the last 168 hours. ------------------------------------------------------------------------------------------------------------------  RADIOLOGY:  No results found.  EKG:   Orders placed or performed during the hospital encounter of 05/15/15  . ED EKG  . ED EKG    IMPRESSION AND PLAN:  Principal Problem:   Atherosclerosis of aorta with gangrene ([place]) - with RBKA per surgery, follow their recommendations for this problem. Active Problems:   Type 2 diabetes mellitus with hyperglycemia ([place]) - patient is on sliding scale insulin with corresponding glucose checks and long acting insulin ordered.   Decubitus ulcer of back, stage 4 ([place]) - wound team following   HTN (hypertension) - relatively well controlled, given low diastolic pressure would not recommend more aggressive HTN regimen at this time.   Chronic systolic CHF (congestive heart failure) ([place]) - continue home dose meds   Malnutrition of moderate degree - nutrition team following with good recs in place  All the records are reviewed and case discussed with ED provider. Management plans discussed with the patient and/or family.  CODE STATUS:     Code Status Orders        Start     Ordered   05/30/15 1821  Do not attempt resuscitation (DNR)   Continuous    Question Answer Comment  In the event of cardiac or respiratory ARREST Do not call a "code blue"   In the event of cardiac or respiratory ARREST Do not perform Intubation, CPR, defibrillation or ACLS   In the event of  cardiac or respiratory ARREST Use medication by any route, position, wound care, and other measures to relive pain and suffering. May use oxygen, suction and manual treatment of airway obstruction as needed for comfort.      05/30/15 1822    Advance Directive  Documentation        Most Recent Value   Type of Advance Directive  Healthcare Power of Attorney   Pre-existing out of facility DNR order (yellow form or pink MOST form)     "MOST" Form in Place?      DNR  TOTAL TIME TAKING CARE OF THIS PATIENT: 40 minutes.    Roniyah Llorens FIELDING 05/31/2015, 8:43 PM  [facility]  Office  [phone]  CC: Primary care Physician: [name], MD

## 2015-05-31 NOTE — Progress Notes (Signed)
Spoke with MD about pt status and insulin/CBG monitoring in addition to prn oxycodone dosage, new orders received. See EMAR. Continue to assess.

## 2015-05-31 NOTE — Progress Notes (Signed)
Per previous order notified sizewise about air mattress for pt.  Dressing to back changed.  sizewise to bring bed later this afternoon. Confirmation number [id number]

## 2015-05-31 NOTE — Care Management Note (Deleted)
Case Management Note  Patient Details  Name: Shelley Fields MRN: [id number] Date of Birth: Oct 22, 1935  Subjective/Objective:     Discussed discharge planning with Dr Megan Salon from [facility]. Dr Megan Salon is recommending discharge to the hospice facility. This Probation officer provided a choice of hospice facilities to [name] who [facility]. Kaylyn Layer, RN at [facility] facility reports that [name] is number 6 on the list [facility]. All available discharge information was faxed to [employer] today. [name]                Action/Plan:   Expected Discharge Date:                  Expected Discharge Plan:     In-House Referral:     Discharge planning Services     Post Acute Care Choice:    Choice offered to:     DME Arranged:    DME Agency:     HH Arranged:    [place] Agency:     Status of Service:     Medicare Important Message Given:  Yes Date Medicare IM Given:    Medicare IM give by:    Date Additional Medicare IM Given:    Additional Medicare Important Message give by:     If discussed at [place] of Stay Meetings, dates discussed:    Additional Comments:  Dawnetta Copenhaver A, RN 05/31/2015, 1:10 PM

## 2015-05-31 NOTE — Anesthesia Postprocedure Evaluation (Signed)
Anesthesia Post Note  Patient: Shelley Fields  Procedure(s) Performed: Procedure(s) (LRB): AMPUTATION BELOW KNEE (Right)  Patient location during evaluation: PACU Anesthesia Type: General Level of consciousness: awake and alert and oriented Pain management: pain level controlled Vital Signs Assessment: post-procedure vital signs reviewed and stable Respiratory status: spontaneous breathing Cardiovascular status: blood pressure returned to baseline Anesthetic complications: no    Last Vitals:  Filed Vitals:   05/31/15 1254 05/31/15 2238  BP: 151/36 152/41  Pulse: 84 85  Temp: 37.1 C 37.1 C  Resp: 18 20    Last Pain:  Filed Vitals:   05/31/15 2238  PainSc: Asleep                 Niranjan Rufener

## 2015-05-31 NOTE — Progress Notes (Signed)
Inpatient Diabetes Program Recommendations  AACE/ADA: New Consensus Statement on Inpatient Glycemic Control (2015)  Target Ranges:  Prepandial:   less than 140 mg/dL      Peak postprandial:   less than 180 mg/dL (1-2 hours)      Critically ill patients:  140 - 180 mg/dL   Spoke to RN regarding my diabetes medication recommendations.  I have asked her to discuss with MD before she administers the patient's next dose of insulin.   Gentry Fitz, RN, BA, MHA, CDE Diabetes Coordinator Inpatient Diabetes Program  [phone] (Team Pager) [phone] ([place]) 05/31/2015 10:08 AM

## 2015-05-31 NOTE — Progress Notes (Signed)
Physical Therapy Treatment Patient Details Name: Shelley Fields MRN: [id number] DOB: 1936/03/25 Today's Date: 05/31/2015    History of Present Illness Pt is in long term SNF care and had R LE gangrene and ultimately needing BKA.  She is confused and does not fully participatory with PT eval.    PT Comments    Pt is quite limited in her willingness to participate, she does tolerate some limited L LE exercises, but has too much pain in the R to do much and even c/o considerable with just stabilizing with R to move L side.  Pt not willing or appropriate to try doing mobility today.  Pt confused and speaking off topic much of the time.  Follow Up Recommendations  SNF     Equipment Recommendations       Recommendations for Other Services       Precautions / Restrictions Precautions Precautions: Fall Restrictions Weight Bearing Restrictions:  (R BKA)    Mobility  Bed Mobility Overal bed mobility:  (pt refuses secondary to pain and agitation)                Transfers                    Ambulation/Gait                 Stairs            Wheelchair Mobility    Modified Rankin (Stroke Patients Only)       Balance                                    Cognition Arousal/Alertness:  (awake, lacking awareness) Behavior During Therapy: Agitated;Impulsive Overall Cognitive Status: Difficult to assess                      Exercises General Exercises - Lower Extremity Ankle Circles/Pumps: AAROM;15 reps;Left Quad Sets: Strengthening;10 reps;Left Heel Slides: AROM;AAROM;10 reps;Left Hip ABduction/ADduction: AAROM;10 reps;AROM (attempted on R, too painful) Straight Leg Raises: AAROM;Left;Right;10 reps (only tolerates 2 reps on the R)    General Comments        Pertinent Vitals/Pain Pain Assessment:  (unrated, but clearly limiting even with L LE ROM)    Home Living Family/patient expects to be discharged to:: Skilled  nursing facility               Additional Comments: likely with hospice transition    Prior Function Level of Independence: Independent with assistive device(s) (at SNF per pt report?)          PT Goals (current goals can now be found in the care plan section) Acute Rehab PT Goals Patient Stated Goal: "Get out of here" PT Goal Formulation: With patient Time For Goal Achievement: 06/14/15 Potential to Achieve Goals: Fair    Frequency  7X/week (per pt participation)    PT Plan      Co-evaluation             End of Session   Activity Tolerance: Patient limited by pain;Treatment limited secondary to agitation Patient left: with bed alarm set;with call bell/phone within reach     Time: 1154-1208 PT Time Calculation (min) (ACUTE ONLY): 14 min  Charges:                       G Codes:  Shelley Fields, PT, DPT [phone]  Shelley Fields 05/31/2015, 1:36 PM

## 2015-05-31 NOTE — Progress Notes (Signed)
Pt blood glucose at 04:00 at 49 with recheck 51. MD called and order for one ampule D5 58mls.

## 2015-05-31 NOTE — Progress Notes (Signed)
[facility]  Daily Progress Note   Subjective  - 1 Day Post-Op  Has had periods of alertness and and responded appropriately to my questions this afternoon. She denies pain  Objective Filed Vitals:   05/30/15 1228 05/30/15 2225 05/31/15 0619 05/31/15 1254  BP: 144/93 174/51 168/51 151/36  Pulse: 107 91 92 84  Temp: 97.2 F (36.2 C) 99.5 F (37.5 C) 98.1 F (36.7 C) 98.8 F (37.1 C)  TempSrc: Oral Oral Oral Oral  Resp:  24 16 18   Height:      Weight:   65.862 kg (145 lb 3.2 oz)   SpO2: 98% 100% 100% 99%    Intake/Output Summary (Last 24 hours) at 05/31/15 1734 Last data filed at 05/31/15 1600  Gross per 24 hour  Intake 1200.5 ml  Output   1000 ml  Net  200.5 ml    PULM  Normal effort , no use of accessory muscles CV  No JVD, RRR Abd      No distended, nontender VASC  dressing clean dry and intact  Laboratory CBC    Component Value Date/Time   WBC 10.3 05/30/2015 0503   WBC 9.7 10/03/2014 1030   HGB 8.2* 05/30/2015 0503   HGB 9.5* 10/03/2014 1030   HCT 26.6* 05/30/2015 0503   HCT 30.7* 10/03/2014 1030   PLT 277 05/30/2015 0503   PLT 325 10/03/2014 1030    BMET    Component Value Date/Time   NA 149* 05/30/2015 0503   NA 136 07/13/2014 1022   K 3.7 05/30/2015 0503   K 4.8 07/13/2014 1022   CL 117* 05/30/2015 0503   CL 101 07/13/2014 1022   CO2 26 05/30/2015 0503   CO2 26 07/13/2014 1022   GLUCOSE 171* 05/30/2015 0503   GLUCOSE 218* 07/13/2014 1022   BUN 33* 05/30/2015 0503   BUN 27* 09/04/2014 1113   CREATININE 1.03* 05/30/2015 0503   CREATININE 1.20* 10/03/2014 1030   CALCIUM 8.0* 05/30/2015 0503   CALCIUM 8.2* 07/13/2014 1022   GFRNONAA 51* 05/30/2015 0503   GFRNONAA 44* 10/03/2014 1030   GFRNONAA 31* 07/13/2014 1022   GFRAA 59* 05/30/2015 0503   GFRAA 50* 10/03/2014 1030   GFRAA 37* 07/13/2014 1022    Assessment/Planning: POD #1 s/p right BKA  I will ask medicine to see her as her diabetes is somewhat difficult  to control as her by mouth intake is inconsistent. Her dressing is intact and will remain on for several more days.  Katha Cabal  05/31/2015, 5:34 PM

## 2015-06-01 LAB — GLUCOSE, CAPILLARY
GLUCOSE-CAPILLARY: 165 mg/dL — AB (ref 65–99)
GLUCOSE-CAPILLARY: 322 mg/dL — AB (ref 65–99)
GLUCOSE-CAPILLARY: 275 mg/dL — AB (ref 65–99)
Glucose-Capillary: 139 mg/dL — ABNORMAL HIGH (ref 65–99)

## 2015-06-01 LAB — HEMOGLOBIN A1C: HEMOGLOBIN A1C: 7.5 % — AB (ref 4.0–6.0)

## 2015-06-01 MED ORDER — SODIUM CHLORIDE 0.45 % IV SOLN: INTRAVENOUS | Status: DC

## 2015-06-01 NOTE — Progress Notes (Signed)
Physical Therapy Treatment Patient Details Name: Shelley Fields MRN: [id number] DOB: 08/21/1935 Today's Date: 06/01/2015    History of Present Illness Pt is in long term SNF care and had R LE gangrene and ultimately needing BKA.  She is confused and does not fully participatory with PT eval.    PT Comments    Pt with better overall participation today, still somewhat resistant to working with PT though family was present and able to motivate her to try and participate.  She did tolerate some increased R and L LE activities but continues to be pain limited.  She shows poor balance in sitting at EOB, but with initial heavy assist and gradual weaning from support she is able to briefly keep herself up in sitting with b/l UE assist and much set up and cuing.  Pt lacking ~4 degrees for full extension on R, educated family on positioning with pillows to work on [employer].   Follow Up Recommendations  SNF     Equipment Recommendations       Recommendations for Other Services       Precautions / Restrictions Precautions Precautions: Fall Precaution Comments: R trans-tibial amputation Restrictions Weight Bearing Restrictions: No (R BKA)    Mobility  Bed Mobility Overal bed mobility: Needs Assistance Bed Mobility: Supine to Sit;Sit to Supine     Supine to sit: Max assist (pt needing mod assist just to remain sitting ) Sit to supine: Max assist   General bed mobility comments: Pt calling out in pain during all transitions and regularly in sitting too.  Pt leaning back and away from R hip but with heavy cuing and varying degrees of assist she was able to maintain sitting EOB for few brief (<10 second) bouts at static sitting  Transfers                 General transfer comment: Pt is not yet safe/ready to try standing.  Ambulation/Gait                 Stairs            Wheelchair Mobility    Modified Rankin (Stroke Patients Only)       Balance Overall balance  assessment: Needs assistance Sitting-balance support: Bilateral upper extremity supported Sitting balance-Leahy Scale: Poor Sitting balance - Comments: Pt able to briefly hold herself up with much assist for hand placement and weight shifting, generally she is falling back and to the L heavily t/o sitting WOB attempt                            Cognition Arousal/Alertness: Awake/alert Behavior During Therapy: Agitated;Impulsive (less so than last session)                        Exercises General Exercises - Lower Extremity Ankle Circles/Pumps: AAROM;15 reps;Left Quad Sets: Strengthening;10 reps;Fields (PROM for L knee gentle extension overpressure ) Gluteal Sets: Strengthening;Fields;10 reps Short Arc Quad: Left;10 reps;AAROM Heel Slides: AROM;AAROM;10 reps;Left Hip ABduction/ADduction: AAROM;Fields;10 reps    General Comments        Pertinent Vitals/Pain Pain Assessment:  (not rated, pt in great pain with essentially any movement)    Home Living                      Prior Function            PT Goals (current goals  can now be found in the care plan section) Progress towards PT goals: Progressing toward goals    Frequency  7X/week    PT Plan Current plan remains appropriate    Co-evaluation             End of Session   Activity Tolerance: Treatment limited secondary to agitation;No increased pain Patient left: with family/visitor present;with bed alarm set;with call bell/phone within reach     Time: 1317-1343 PT Time Calculation (min) (ACUTE ONLY): 26 min  Charges:  $Gait Training: 8-22 mins $Therapeutic Exercise: 8-22 mins $Therapeutic Activity: 8-22 mins                    G Codes:     Shelley Fields, PT, DPT [phone]  Kreg Shropshire 06/01/2015, 3:18 PM

## 2015-06-01 NOTE — Clinical Social Work Note (Signed)
CSW confirmed with Jeani Hawking, RN CM that she had mistakenly made a referral to hospice home on patient. CSW requested patient be taken off hospice home waiting list. Plan for patient is to return as planned to [facility] at discharge. Shela Leff MSW,LCSW [phone]

## 2015-06-01 NOTE — Progress Notes (Signed)
3 Days Post-Op  Subjective: Doing OK. Denies Pain. Confusion appears to be at baseline. Per nursing without complaints, tol PO  Objective: Vital signs in last 24 hours: Temp:  [98 F (36.7 C)-98.8 F (37.1 C)] 98 F (36.7 C) (12/24 0630) Pulse Rate:  [84-88] 88 (12/24 0630) Resp:  [18-20] 20 (12/24 0630) BP: (151-152)/(36-50) 151/50 mmHg (12/24 0630) SpO2:  [99 %-100 %] 100 % (12/24 0630) Weight:  [65.772 kg (145 lb)] 65.772 kg (145 lb) (12/24 0500) Last BM Date:  (pt unable to state)  Intake/Output from previous day: 12/23 0701 - 12/24 0700 In: [id number] [P.O.:200; I.V.:1046] Out: 950 [Urine:950] Intake/Output this shift: Total I/O In: 300 [P.O.:300] Out: -   Gen: Alert, NAD Right leg: Stump dressing- C/D/I  Lab Results:   Recent Labs  05/29/15 1341 05/30/15 0503  WBC 10.7 10.3  HGB 10.2* 8.2*  HCT 33.2* 26.6*  PLT 367 277   BMET  Recent Labs  05/29/15 1341 05/30/15 0503  NA 147* 149*  K 4.1 3.7  CL 115* 117*  CO2 24 26  GLUCOSE 269* 171*  BUN 35* 33*  CREATININE 1.07* 1.03*  CALCIUM 8.7* 8.0*   PT/INR  Recent Labs  05/29/15 1341  LABPROT 21.9*  INR 1.92   ABG No results for input(s): PHART, HCO3 in the last 72 hours.  Invalid input(s): PCO2, PO2  Studies/Results: No results found.  Anti-infectives: Anti-infectives    Start     Dose/Rate Route Frequency Ordered Stop   05/29/15 1415  vancomycin (VANCOCIN) IVPB 1000 mg/200 mL premix     1,000 mg 200 mL/hr over 60 Minutes Intravenous  Once 05/29/15 1410 05/29/15 1515   05/29/15 1225  ceFAZolin (ANCEF) 2-3 GM-% IVPB SOLR    Comments:  Veda Canning: cabinet override      05/29/15 1225 05/29/15 1803   05/29/15 0208  ceFAZolin (ANCEF) IVPB 2 g/50 mL premix  Status:  Discontinued     2 g 100 mL/hr over 30 Minutes Intravenous On call to O.R. 05/29/15 0208 05/29/15 1411      Assessment/Plan: s/p Procedure(s): AMPUTATION BELOW KNEE (Right)  Keep dressing on for now Appreciate IM  input OOB to chair today   LOS: 3 days    Shelley Fields 06/01/2015

## 2015-06-01 NOTE — Progress Notes (Signed)
[facility] at [place] NAME: Michele Lucking    MR#:  [id number]  DATE OF BIRTH:  [birth year]  SUBJECTIVE:  CHIEF COMPLAINT:  No chief complaint on file.  Denies pain. Feels thirsty.  REVIEW OF SYSTEMS:   Review of Systems  Constitutional: Negative for fever.  Respiratory: Negative for shortness of breath.   Cardiovascular: Negative for chest pain and palpitations.  Gastrointestinal: Negative for nausea, vomiting and abdominal pain.  Genitourinary: Negative for dysuria.   DRUG ALLERGIES:   Allergies  Allergen Reactions  . Contrast Media [Iodinated Diagnostic Agents]     Kidneys are operational at 28% and wont use dyes    VITALS:  Blood pressure 151/50, pulse 88, temperature 98 F (36.7 C), temperature source Oral, resp. rate 20, height 5' (1.524 m), weight 65.772 kg (145 lb), SpO2 100 %.  PHYSICAL EXAMINATION:  GENERAL:  79 y.o.-year-old patient lying in the bed with no acute distress.  EYES: Pupils equal, round, reactive to light and accommodation. No scleral icterus. Extraocular muscles intact.  HEENT: Head atraumatic, normocephalic. Oropharynx and nasopharynx clear. Mucous membranes are dry NECK:  Supple, no jugular venous distention. No thyroid enlargement, no tenderness.  LUNGS: Normal breath sounds bilaterally, no wheezing, rales,rhonchi or crepitation. No use of accessory muscles of respiration.  CARDIOVASCULAR: S1, S2 normal. No murmurs, rubs, or gallops.  ABDOMEN: Soft, nontender, nondistended. Bowel sounds present. No organomegaly or mass.  EXTREMITIES: No pedal edema, cyanosis, or clubbing. Right BKA dressed dry NEUROLOGIC: Cranial nerves II through XII are intact. Muscle strength 5/5 in all extremities. Sensation intact. Gait not checked.  PSYCHIATRIC: The patient is alert and oriented x 3.  SKIN: No obvious rash, lesion, or ulcer.    LABORATORY PANEL:   CBC  Recent Labs Lab 05/30/15 0503  WBC 10.3  HGB 8.2*   HCT 26.6*  PLT 277   ------------------------------------------------------------------------------------------------------------------  Chemistries   Recent Labs Lab 05/30/15 0503  NA 149*  K 3.7  CL 117*  CO2 26  GLUCOSE 171*  BUN 33*  CREATININE 1.03*  CALCIUM 8.0*   ------------------------------------------------------------------------------------------------------------------  Cardiac Enzymes No results for input(s): TROPONINI in the last 168 hours. ------------------------------------------------------------------------------------------------------------------  RADIOLOGY:  No results found.  EKG:   Orders placed or performed during the hospital encounter of 05/15/15  . ED EKG  . ED EKG    ASSESSMENT AND PLAN:   1. 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 Diabetes mellitus - CBG's are fairly well controlled. Check hemoglobin A1c. Continue sliding scale and current dose of Levemir  3. Hypertension - Blood pressure fairly well controlled currently - Continue Norvasc,  4. Chronic systolic heart failure - 2-D echocardiogram 12/7 shows preserved ejection fraction. Possible diastolic dysfunction. - No exacerbation at this time  5. Malnutrition - Encourage by mouth intake  6. Hypernatremia - Start half-normal saline at low dose - Likely due to volume loss during hospitalization low by mouth intake  7. Decubitus ulcer of the back - Continue with wound care    All the records are reviewed and case discussed with Care Management/Social Workerr. Management plans discussed with the patient, family and they are in agreement.  CODE STATUS: DO NOT RESUSCITATE  TOTAL TIME TAKING CARE OF THIS PATIENT: 25 minutes.  Greater than 50% of time spent in care coordination and counseling. POSSIBLE D/C IN 2-3 DAYS, DEPENDING ON CLINICAL CONDITION.   Myrtis Ser M.D on 06/01/2015 at 2:05 PM  Between 7am to  6pm - Pager - [phone]  After 6pm go to [email or web address] - password [email or web address] [facility]  Office  [phone]  CC: Primary care physician; [name], MD

## 2015-06-02 LAB — BASIC METABOLIC PANEL
ANION GAP: 7 (ref 5–15)
BUN: 28 mg/dL — AB (ref 6–20)
CO2: 24 mmol/L (ref 22–32)
Calcium: 7.4 mg/dL — ABNORMAL LOW (ref 8.9–10.3)
Chloride: 106 mmol/L (ref 101–111)
Creatinine, Ser: 1.07 mg/dL — ABNORMAL HIGH (ref 0.44–1.00)
GFR calc Af Amer: 57 mL/min — ABNORMAL LOW (ref >60)
GFR calc non Af Amer: 49 mL/min — ABNORMAL LOW (ref >60)
GLUCOSE: 253 mg/dL — AB (ref 65–99)
POTASSIUM: 3.4 mmol/L — AB (ref 3.5–5.1)
Sodium: 137 mmol/L (ref 135–145)

## 2015-06-02 LAB — GLUCOSE, CAPILLARY
GLUCOSE-CAPILLARY: 273 mg/dL — AB (ref 65–99)
Glucose-Capillary: 230 mg/dL — ABNORMAL HIGH (ref 65–99)
Glucose-Capillary: 150 mg/dL — ABNORMAL HIGH (ref 65–99)
Glucose-Capillary: 167 mg/dL — ABNORMAL HIGH (ref 65–99)
Glucose-Capillary: 412 mg/dL — ABNORMAL HIGH (ref 65–99)
Glucose-Capillary: 279 mg/dL — ABNORMAL HIGH (ref 65–99)
Glucose-Capillary: 321 mg/dL — ABNORMAL HIGH (ref 65–99)

## 2015-06-02 LAB — RETICULOCYTES
RBC.: 3.34 MIL/uL — AB (ref 3.80–5.20)
RETIC CT PCT: 0.7 % (ref 0.4–3.1)
Retic Count, Absolute: 23.4 10*3/uL (ref 19.0–183.0)

## 2015-06-02 LAB — CBC
HEMATOCRIT: 26.6 % — AB (ref 35.0–47.0)
Hemoglobin: 8.6 g/dL — ABNORMAL LOW (ref 12.0–16.0)
MCH: 27.2 pg (ref 26.0–34.0)
MCHC: 32.5 g/dL (ref 32.0–36.0)
MCV: 83.7 fL (ref 80.0–100.0)
Platelets: 289 10*3/uL (ref 150–440)
RBC: 3.18 MIL/uL — AB (ref 3.80–5.20)
RDW: 17.8 % — ABNORMAL HIGH (ref 11.5–14.5)
WBC: 7.2 10*3/uL (ref 3.6–11.0)

## 2015-06-02 LAB — IRON AND TIBC
Iron: 12 ug/dL — ABNORMAL LOW (ref 28–170)
SATURATION RATIOS: 9 % — AB (ref 10.4–31.8)
TIBC: 131 ug/dL — ABNORMAL LOW (ref 250–450)
UIBC: 119 ug/dL

## 2015-06-02 LAB — FOLATE: FOLATE: 7.6 ng/mL (ref >5.9)

## 2015-06-02 LAB — VITAMIN B12: Vitamin B-12: 558 pg/mL (ref 180–914)

## 2015-06-02 LAB — FERRITIN: Ferritin: 243 ng/mL (ref 11–307)

## 2015-06-02 MED ORDER — INSULIN DETEMIR 100 UNIT/ML ~~LOC~~ SOLN
18.0000 [IU] | Freq: Every day | SUBCUTANEOUS | Status: DC
  Administered 2015-06-02 – 2015-06-06 (×5): 18 [IU] via SUBCUTANEOUS
  Filled 2015-06-02 (×6): qty 0.18

## 2015-06-02 MED ORDER — POTASSIUM CHLORIDE CRYS ER 20 MEQ PO TBCR
40.0000 meq | EXTENDED_RELEASE_TABLET | Freq: Once | ORAL | Status: AC
  Administered 2015-06-02: 40 meq via ORAL

## 2015-06-02 NOTE — Progress Notes (Signed)
4 Days Post-Op  Subjective: Doing OK. No new issues.  Objective: Vital signs in last 24 hours: Temp:  [98.4 F (36.9 C)-99.4 F (37.4 C)] 98.4 F (36.9 C) (12/25 0549) Pulse Rate:  [90-94] 90 (12/25 0549) Resp:  [16-18] 18 (12/25 0549) BP: (152-156)/(45-56) 156/45 mmHg (12/25 0549) SpO2:  [99 %-100 %] 99 % (12/25 0549) Weight:  [64.411 kg (142 lb)-73.483 kg (162 lb)] 64.411 kg (142 lb) (12/25 0700) Last BM Date: 06/01/15  Intake/Output from previous day: 12/24 0701 - 12/25 0700 In: [id number] [P.O.:700; I.V.:667] Out: 1300 [Urine:1300] Intake/Output this shift: Total I/O In: 454 [I.V.:454] Out: -   Gen: Alert, NAD CV: RR ABD: Soft, NT, ND EXT: right BKA stump dressing- C/D/I  Lab Results:   Recent Labs  06/02/15 0556  WBC 7.2  HGB 8.6*  HCT 26.6*  PLT 289   BMET  Recent Labs  06/02/15 0556  NA 137  K 3.4*  CL 106  CO2 24  GLUCOSE 253*  BUN 28*  CREATININE 1.07*  CALCIUM 7.4*   PT/INR No results for input(s): LABPROT, INR in the last 72 hours. ABG No results for input(s): PHART, HCO3 in the last 72 hours.  Invalid input(s): PCO2, PO2  Studies/Results: No results found.  Anti-infectives: Anti-infectives    Start     Dose/Rate Route Frequency Ordered Stop   05/29/15 1415  vancomycin (VANCOCIN) IVPB 1000 mg/200 mL premix     1,000 mg 200 mL/hr over 60 Minutes Intravenous  Once 05/29/15 1410 05/29/15 1515   05/29/15 1225  ceFAZolin (ANCEF) 2-3 GM-% IVPB SOLR    Comments:  Veda Canning: cabinet override      05/29/15 1225 05/29/15 1803   05/29/15 0208  ceFAZolin (ANCEF) IVPB 2 g/50 mL premix  Status:  Discontinued     2 g 100 mL/hr over 30 Minutes Intravenous On call to O.R. 05/29/15 0208 05/29/15 1411      Assessment/Plan: s/p Procedure(s): AMPUTATION BELOW KNEE (Right)  Will change dressing on BKA stump tomorrow Continue IM recomendations- much appreciated. Discharge planning for early next week   LOS: 4 days    Jamesetta So  A 06/02/2015

## 2015-06-02 NOTE — Progress Notes (Signed)
[facility] at [place] NAME: Shelley Fields    MR#:  [id number]  DATE OF BIRTH:  1935-08-27  SUBJECTIVE:  CHIEF COMPLAINT:  No chief complaint on file.  Sleepy today, denies pain  REVIEW OF SYSTEMS:   Review of Systems  Constitutional: Negative for fever.  Respiratory: Negative for shortness of breath.   Cardiovascular: Negative for chest pain and palpitations.  Gastrointestinal: Negative for nausea, vomiting and abdominal pain.  Genitourinary: Negative for dysuria.   DRUG ALLERGIES:   Allergies  Allergen Reactions  . Contrast Media [Iodinated Diagnostic Agents]     Kidneys are operational at 28% and wont use dyes    VITALS:  Blood pressure 153/41, pulse 109, temperature 98.1 F (36.7 C), temperature source Oral, resp. rate 18, height 5' (1.524 m), weight 64.411 kg (142 lb), SpO2 99 %.  PHYSICAL EXAMINATION:  GENERAL:  79 y.o.-year-old patient lying in the bed with no acute distress. Pale EYES: Pupils equal, round, reactive to light and accommodation. No scleral icterus. Extraocular muscles intact.  HEENT: Head atraumatic, normocephalic. Oropharynx and nasopharynx clear. Mucous membranes are dry NECK:  Supple, no jugular venous distention. No thyroid enlargement, no tenderness.  LUNGS: Normal breath sounds bilaterally, no wheezing, rales,rhonchi or crepitation. No use of accessory muscles of respiration.  CARDIOVASCULAR: S1, S2 normal. No murmurs, rubs, or gallops.  ABDOMEN: Soft, nontender, nondistended. Bowel sounds present. No organomegaly or mass.  EXTREMITIES: No pedal edema, cyanosis, or clubbing. Right BKA dressed dry NEUROLOGIC: Cranial nerves II through XII are intact. Muscle strength 5/5 in all extremities. Sensation intact. Gait not checked.  PSYCHIATRIC: The patient is alert and oriented x 3.  SKIN: No obvious rash, lesion, or ulcer.    LABORATORY PANEL:   CBC  Recent Labs Lab 06/02/15 0556  WBC 7.2  HGB  8.6*  HCT 26.6*  PLT 289   ------------------------------------------------------------------------------------------------------------------  Chemistries   Recent Labs Lab 06/02/15 0556  NA 137  K 3.4*  CL 106  CO2 24  GLUCOSE 253*  BUN 28*  CREATININE 1.07*  CALCIUM 7.4*   ------------------------------------------------------------------------------------------------------------------  Cardiac Enzymes No results for input(s): TROPONINI in the last 168 hours. ------------------------------------------------------------------------------------------------------------------  RADIOLOGY:  No results found.  EKG:   Orders placed or performed during the hospital encounter of 05/15/15  . ED EKG  . ED EKG    ASSESSMENT AND PLAN:   1. 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 Diabetes mellitus -  A1c is 7.5, good control. At home - Continue sliding scale and increase dose of Levemir due to elevated CBGs today  3. Hypertension - Blood pressure fairly well controlled currently - Continue Norvasc,  4. Chronic systolic heart failure - 2-D echocardiogram 12/7 shows preserved ejection fraction. Possible diastolic dysfunction. - No exacerbation at this time  5. Malnutrition - Encourage by mouth intake  6. Hypernatremia - Resolved  7. Decubitus ulcer of the back - Continue with wound care  8. Hypokalemia - Replace and monitor  9. Anemia - Likely anemia of chronic disease, continue to monitor and check iron panel  10. Alkalosis - Etiology is not clear, switch to normal saline  All the records are reviewed and case discussed with Care Management/Social Workerr. Management plans discussed with the patient, family and they are in agreement.  CODE STATUS: DO NOT RESUSCITATE  TOTAL TIME TAKING CARE OF THIS PATIENT: 25 minutes.  Greater than 50% of time spent in care  coordination and counseling. POSSIBLE D/C IN 2-3  DAYS, DEPENDING ON CLINICAL CONDITION.   Myrtis Ser M.D on 06/02/2015 at 2:06 PM  Between 7am to 6pm - Pager - [phone]  After 6pm go to [email or web address] - password [email or web address] [facility]  Office  [phone]  CC: Primary care physician; [name], MD

## 2015-06-03 LAB — CBC
HCT: 25.2 % — ABNORMAL LOW (ref 35.0–47.0)
Hemoglobin: 8.1 g/dL — ABNORMAL LOW (ref 12.0–16.0)
MCH: 26.4 pg (ref 26.0–34.0)
MCHC: 32.3 g/dL (ref 32.0–36.0)
MCV: 81.7 fL (ref 80.0–100.0)
PLATELETS: 325 10*3/uL (ref 150–440)
RBC: 3.09 MIL/uL — ABNORMAL LOW (ref 3.80–5.20)
RDW: 17.8 % — ABNORMAL HIGH (ref 11.5–14.5)
WBC: 8 10*3/uL (ref 3.6–11.0)

## 2015-06-03 LAB — GLUCOSE, CAPILLARY
GLUCOSE-CAPILLARY: 137 mg/dL — AB (ref 65–99)
GLUCOSE-CAPILLARY: 262 mg/dL — AB (ref 65–99)
GLUCOSE-CAPILLARY: 166 mg/dL — AB (ref 65–99)
Glucose-Capillary: 161 mg/dL — ABNORMAL HIGH (ref 65–99)

## 2015-06-03 LAB — BASIC METABOLIC PANEL
Anion gap: 8 (ref 5–15)
BUN: 30 mg/dL — AB (ref 6–20)
CO2: 23 mmol/L (ref 22–32)
CREATININE: 0.99 mg/dL (ref 0.44–1.00)
Calcium: 8.2 mg/dL — ABNORMAL LOW (ref 8.9–10.3)
Chloride: 110 mmol/L (ref 101–111)
GFR calc Af Amer: >60 mL/min (ref >60)
GFR, EST NON AFRICAN AMERICAN: 54 mL/min — AB (ref >60)
GLUCOSE: 158 mg/dL — AB (ref 65–99)
Potassium: 3.8 mmol/L (ref 3.5–5.1)
SODIUM: 141 mmol/L (ref 135–145)

## 2015-06-03 MED ORDER — AMLODIPINE BESYLATE 10 MG PO TABS
10.0000 mg | ORAL_TABLET | Freq: Every day | ORAL | Status: DC
  Administered 2015-06-04 – 2015-06-07 (×4): 10 mg via ORAL
  Filled 2015-06-03 (×4): qty 1

## 2015-06-03 NOTE — Progress Notes (Signed)
Nutrition Follow-up  DOCUMENTATION CODES:   Non-severe (moderate) malnutrition in context of chronic illness  INTERVENTION:  Meals and snacks: Cater to pt preferences and monitor intake Medical Nutrition Supplement: continue boost breeze TID, magic cup and mightyshake for added nutrition   NUTRITION DIAGNOSIS:   Inadequate oral intake related to poor appetite as evidenced by meal completion < 25%.    GOAL:   Patient will meet greater than or equal to 90% of their needs    MONITOR:    (Energy Intake, Anthropometrics, Digestive System, Electrolyte/Renal Profile)  REASON FOR ASSESSMENT:   Consult  (stage IV pressure ulcer)  ASSESSMENT:     Pt s/p BKA   Current Nutrition: eating  Lunch during visit.  Dtr at bedside and reports drinking some of boost breeze and mightyshake, only taking few bites of magic cup.  Noted per I and O sheet intake 75%, 50%, refused, 40%   Gastrointestinal Profile: Last BM: 12/25   Scheduled Medications:  . [START ON 06/04/2015] amLODipine  10 mg Oral Daily  . aspirin EC  81 mg Oral Daily  . atorvastatin  40 mg Oral q1800  . collagenase   Topical Daily  . docusate  100 mg Oral BID  . enoxaparin (LOVENOX) injection  30 mg Subcutaneous Q12H  . feeding supplement  1 Container Oral TID BM  . insulin aspart  0-5 Units Subcutaneous QHS  . insulin aspart  0-9 Units Subcutaneous TID WC  . insulin detemir  18 Units Subcutaneous QHS  . magnesium oxide  400 mg Oral Daily  . mirtazapine  15 mg Oral QHS  . risperiDONE  0.25 mg Oral QHS  . sertraline  25 mg Oral Daily  . sodium chloride  3 mL Intravenous Q12H  . tamsulosin  0.4 mg Oral BID        Electrolyte/Renal Profile and Glucose Profile:   Recent Labs Lab 05/30/15 0503 06/02/15 0556 06/03/15 0600  NA 149* 137 141  K 3.7 3.4* 3.8  CL 117* 106 110  CO2 26 24 23   BUN 33* 28* 30*  CREATININE 1.03* 1.07* 0.99  CALCIUM 8.0* 7.4* 8.2*  GLUCOSE 171* 253* 158*      Weight Trend  since Admission: Filed Weights   06/01/15 0500 06/02/15 0549 06/02/15 0700  Weight: 145 lb (65.772 kg) 162 lb (73.483 kg) 142 lb (64.411 kg)      Diet Order:  DIET DYS 3 Room service appropriate?: Yes; Fluid consistency:: Thin  Skin:   reviewed   Height:   Ht Readings from Last 1 Encounters:  05/29/15 5' (1.524 m)    Weight:   Wt Readings from Last 1 Encounters:  06/02/15 142 lb (64.411 kg)    Ideal Body Weight:     BMI:  Body mass index is 27.73 kg/(m^2).  Estimated Nutritional Needs:   Kcal:  [id number] kcals (BEE 1096, 1.2 AF, 1.2-1.4 IF)   Protein:  83-104 g (1.2-1.5 g/kg)   Fluid:  1725-2070 mL (25-30 ml/kg)   EDUCATION NEEDS:   No education needs identified at this time  [place]. Zenia Resides, [place], [place] (pager) Weekend/On-Call pager [phone])

## 2015-06-03 NOTE — Progress Notes (Signed)
[facility] at [place] NAME: Shelley Fields    MR#:  [id number]  DATE OF BIRTH:  12-08-1935  SUBJECTIVE:  CHIEF COMPLAINT:  No chief complaint on file.  Sleepy again on my examination. Nursing reports that she has periods of agitation and is very alert at those times  REVIEW OF SYSTEMS:   ROS unable to assess as she is groggy  DRUG ALLERGIES:   Allergies  Allergen Reactions  . Contrast Media [Iodinated Diagnostic Agents]     Kidneys are operational at 28% and wont use dyes    VITALS:  Blood pressure 173/94, pulse 93, temperature 98.1 F (36.7 C), temperature source Oral, resp. rate 20, height 5' (1.524 m), weight 64.411 kg (142 lb), SpO2 100 %.  PHYSICAL EXAMINATION:  GENERAL:  79 y.o.-year-old patient lying in the bed with no acute distress. Pale LUNGS: Normal breath sounds bilaterally, no wheezing, rales,rhonchi or crepitation. No use of accessory muscles of respiration.  CARDIOVASCULAR: S1, S2 normal. No murmurs, rubs, or gallops.  ABDOMEN: Soft, nontender, nondistended. Bowel sounds present. No organomegaly or mass.  EXTREMITIES: No pedal edema, cyanosis, or clubbing. Right BKA dressed dry NEUROLOGIC: Cranial nerves II through XII are intact. Muscle strength 5/5 in all extremities. Sensation intact. Gait not checked.  PSYCHIATRIC: The patient is groggy this morning, oriented to person and place SKIN: No obvious rash, lesion, or ulcer.    LABORATORY PANEL:   CBC  Recent Labs Lab 06/03/15 0600  WBC 8.0  HGB 8.1*  HCT 25.2*  PLT 325   ------------------------------------------------------------------------------------------------------------------  Chemistries   Recent Labs Lab 06/03/15 0600  NA 141  K 3.8  CL 110  CO2 23  GLUCOSE 158*  BUN 30*  CREATININE 0.99  CALCIUM 8.2*   ------------------------------------------------------------------------------------------------------------------  Cardiac  Enzymes No results for input(s): TROPONINI in the last 168 hours. ------------------------------------------------------------------------------------------------------------------  RADIOLOGY:  No results found.  EKG:   Orders placed or performed during the hospital encounter of 05/15/15  . ED EKG  . ED EKG    ASSESSMENT AND PLAN:   1. 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 Diabetes mellitus -  A1c is 7.5, good control at home - Continue sliding scale and 18 units Levemir    3. Hypertension: Blood pressure elevated - Increase Norvasc today - Is also on Lasix at home which has been held  4. Chronic systolic heart failure - 2-D echocardiogram 12/7 shows preserved ejection fraction. Possible diastolic dysfunction. - No exacerbation at this time  5. Malnutrition - Encourage by mouth intake  6. Hypernatremia - Resolved  7. Decubitus ulcer of the back - Continue with wound care  8. Hypokalemia - Replace and monitor  9. Anemia: Trending down - Anemia panel suggestive of AOCD  All the records are reviewed and case discussed with Care Management/Social Workerr.  CODE STATUS: DO NOT RESUSCITATE  TOTAL TIME TAKING CARE OF THIS PATIENT: 25 minutes.  Greater than 50% of time spent in care coordination and counseling. POSSIBLE D/C IN 2-3 DAYS, DEPENDING ON CLINICAL CONDITION.   Myrtis Ser M.D on 06/03/2015 at 11:42 AM  Between 7am to 6pm - Pager - [phone]  After 6pm go to [email or web address] - password [email or web address] [facility]  Office  [phone]  CC: Primary care physician; [name], MD

## 2015-06-03 NOTE — Care Management Important Message (Signed)
Important Message  Patient Details  Name: ROME BEVANS MRN: [id number] Date of Birth: 04-14-1936   Medicare Important Message Given:  Yes    Lennex Pietila A, RN 06/03/2015, 10:06 AM

## 2015-06-03 NOTE — Progress Notes (Signed)
Physical Therapy Treatment Patient Details Name: Shelley Fields MRN: [id number] DOB: January 20, 1936 Today's Date: 06/03/2015    History of Present Illness Pt is in long term SNF care and had R LE gangrene and ultimately needing BKA.  She is confused during PT treatment and is AOx1    PT Comments    Pt confused today during therapy session. She reports increased pain with all bed mobility. Attempted sitting at EOB but pt only tolerates for short bout. Performs limited reaching with therapist at EOB to encourage independent sitting but limited due to confusion as well as motivation. Pt wanting to return to supine throughout. Does not demonstrate adequate sitting balance or strength to attempt transfers or ambulation on this date. Pt is very limited due to cognition as well as poor overall state. Pt will benefit from skilled PT services to address deficits in strength, balance, and mobility in order to return to full function at home. e  Follow Up Recommendations  SNF     Equipment Recommendations       Recommendations for Other Services       Precautions / Restrictions Precautions Precautions: Fall Precaution Comments: R trans-tibial amputation Restrictions Weight Bearing Restrictions: No    Mobility  Bed Mobility Overal bed mobility: Needs Assistance Bed Mobility: Supine to Sit;Sit to Supine     Supine to sit: Max assist Sit to supine: Max assist   General bed mobility comments: Pt is very weak during bed mobility and requires max tactile and verbal cues. She actively resists therapist with sitting upright at EOB. Pt unable to maintain static sitting balance without min to modA+1 from therapist as well as bilateral UE support. Pt consistently falling backwards while ssitng  Transfers                 General transfer comment: Pt is not yet safe/ready to try standing.  Ambulation/Gait                 Stairs            Wheelchair Mobility    Modified  Rankin (Stroke Patients Only)       Balance Overall balance assessment: Needs assistance Sitting-balance support: Bilateral upper extremity supported Sitting balance-Leahy Scale: Poor Sitting balance - Comments: Pt needs consistent min to modA+1 and UE support for sitting balance. Attempted to work with patient on dynamic reaching to encouraged abdominal activation and anterior weight shifting but poor motivation and command follow noted. Pt resists therapist and wants to return to supine                            Cognition Arousal/Alertness: Awake/alert Behavior During Therapy: Flat affect Overall Cognitive Status: No family/caregiver present to determine baseline cognitive functioning Area of Impairment: Orientation Orientation Level: Disoriented to;Place;Time;Situation                  Exercises General Exercises - Upper Extremity Shoulder Flexion: Strengthening;Both;10 reps;Supine Elbow Flexion: Strengthening;Both;10 reps;Supine Elbow Extension: Strengthening;Both;10 reps;Supine General Exercises - Lower Extremity Ankle Circles/Pumps: Strengthening;Left;10 reps;Supine Quad Sets: Strengthening;Both;10 reps;Supine Gluteal Sets:  (Pt unable to understand how to perform when attempted) Short Arc Quad: Strengthening;Both;10 reps;Supine Heel Slides: Strengthening;Left;10 reps;Supine Hip ABduction/ADduction: Strengthening;Both;10 reps;Supine Straight Leg Raises: Strengthening;Both;10 reps;Supine    General Comments        Pertinent Vitals/Pain Pain Assessment: Faces Faces Pain Scale: Hurts even more Pain Location: Pt reports R leg hurts "a little bit." When  asked for rating on NPRS pt reports 10/10. Unable to rate appropriately based on response. Pain increases during bed mobility Pain Intervention(s): Monitored during session;Repositioned    Home Living                      Prior Function            PT Goals (current goals can now be found in  the care plan section) Acute Rehab PT Goals Patient Stated Goal: When asked pt reports a good outcome would be "not doing therapy." PT Goal Formulation: Patient unable to participate in goal setting Time For Goal Achievement: 06/14/15 Potential to Achieve Goals: Poor Progress towards PT goals: Progressing toward goals    Frequency  7X/week    PT Plan Current plan remains appropriate    Co-evaluation             End of Session   Activity Tolerance: Other (comment) (Limited secondary to confusion) Patient left: in bed;with bed alarm set;with call bell/phone within reach     Time: 1355-1420 PT Time Calculation (min) (ACUTE ONLY): 25 min  Charges:  $Therapeutic Exercise: 8-22 mins $Therapeutic Activity: 8-22 mins                    G Codes:      Shelley Fields PT, DPT   Shelley Fields 06/03/2015, 2:22 PM

## 2015-06-03 NOTE — Progress Notes (Signed)
5 Days Post-Op  Subjective: Doing OK. No new issues.  Objective: Vital signs in last 24 hours: Temp:  [98.1 F (36.7 C)-98.6 F (37 C)] 98.1 F (36.7 C) (12/26 0600) Pulse Rate:  [91-109] 93 (12/26 0604) Resp:  [20] 20 (12/26 0600) BP: (120-185)/(41-94) 173/94 mmHg (12/26 0604) SpO2:  [99 %-100 %] 100 % (12/26 0604) Last BM Date: 06/02/15  Intake/Output from previous day: 12/25 0701 - 12/26 0700 In: 517 [P.O.:60; I.V.:457] Out: 1300 [Urine:1300] Intake/Output this shift:    Gen: Alert, NAD CV: RR ABD: Soft, NT, ND EXT: right BKA stump dressing- C/D/I  Lab Results:   Recent Labs  06/02/15 0556 06/03/15 0600  WBC 7.2 8.0  HGB 8.6* 8.1*  HCT 26.6* 25.2*  PLT 289 325   BMET  Recent Labs  06/02/15 0556 06/03/15 0600  NA 137 141  K 3.4* 3.8  CL 106 110  CO2 24 23  GLUCOSE 253* 158*  BUN 28* 30*  CREATININE 1.07* 0.99  CALCIUM 7.4* 8.2*   PT/INR No results for input(s): LABPROT, INR in the last 72 hours. ABG  Recent Labs  06/02/15 1320  PHART 7.58*  HCO3 22.5    Studies/Results: No results found.  Anti-infectives: Anti-infectives    Start     Dose/Rate Route Frequency Ordered Stop   05/29/15 1415  vancomycin (VANCOCIN) IVPB 1000 mg/200 mL premix     1,000 mg 200 mL/hr over 60 Minutes Intravenous  Once 05/29/15 1410 05/29/15 1515   05/29/15 1225  ceFAZolin (ANCEF) 2-3 GM-% IVPB SOLR    Comments:  Veda Canning: cabinet override      05/29/15 1225 05/29/15 1803   05/29/15 0208  ceFAZolin (ANCEF) IVPB 2 g/50 mL premix  Status:  Discontinued     2 g 100 mL/hr over 30 Minutes Intravenous On call to O.R. 05/29/15 0208 05/29/15 1411      Assessment/Plan: s/p Procedure(s): AMPUTATION BELOW KNEE (Right)  Will change dressing on BKA stump tomorrow Continue IM recomendations- much appreciated. Discharge planning for early next week if stump incision is healing nicely then will move forward with DC   LOS: 5 days    Katha Cabal 06/03/2015

## 2015-06-03 NOTE — Clinical Social Work Note (Signed)
MD states patient may discharge back to [facility] in 1 to 2 days. CSW has informed Helene Kelp at [facility] Shela Leff MSW,LCSW [phone]

## 2015-06-04 LAB — BLOOD GAS, ARTERIAL
ALLENS TEST (PASS/FAIL): POSITIVE — AB
Acid-Base Excess: 0.7 mmol/L (ref 0.0–3.0)
Bicarbonate: 22.5 mEq/L (ref 21.0–28.0)
FIO2: 0.21
O2 Saturation: 99.7 %
PCO2 ART: 24 mmHg — AB (ref 32.0–48.0)
PH ART: 7.58 — AB (ref 7.350–7.450)
Patient temperature: 37
pO2, Arterial: 173 mmHg — ABNORMAL HIGH (ref 83.0–108.0)

## 2015-06-04 LAB — CBC
HCT: 24.1 % — ABNORMAL LOW (ref 35.0–47.0)
HEMOGLOBIN: 7.7 g/dL — AB (ref 12.0–16.0)
MCH: 26.8 pg (ref 26.0–34.0)
MCHC: 32 g/dL (ref 32.0–36.0)
MCV: 83.7 fL (ref 80.0–100.0)
Platelets: 321 10*3/uL (ref 150–440)
RBC: 2.88 MIL/uL — ABNORMAL LOW (ref 3.80–5.20)
RDW: 17.8 % — ABNORMAL HIGH (ref 11.5–14.5)
WBC: 6.6 10*3/uL (ref 3.6–11.0)

## 2015-06-04 LAB — GLUCOSE, CAPILLARY
GLUCOSE-CAPILLARY: 69 mg/dL (ref 65–99)
GLUCOSE-CAPILLARY: 199 mg/dL — AB (ref 65–99)
Glucose-Capillary: 151 mg/dL — ABNORMAL HIGH (ref 65–99)
Glucose-Capillary: 155 mg/dL — ABNORMAL HIGH (ref 65–99)

## 2015-06-04 LAB — BASIC METABOLIC PANEL
Anion gap: 6 (ref 5–15)
BUN: 29 mg/dL — AB (ref 6–20)
CHLORIDE: 112 mmol/L — AB (ref 101–111)
CO2: 24 mmol/L (ref 22–32)
CREATININE: 1.02 mg/dL — AB (ref 0.44–1.00)
Calcium: 8.1 mg/dL — ABNORMAL LOW (ref 8.9–10.3)
GFR calc Af Amer: 60 mL/min — ABNORMAL LOW (ref >60)
GFR calc non Af Amer: 52 mL/min — ABNORMAL LOW (ref >60)
Glucose, Bld: 128 mg/dL — ABNORMAL HIGH (ref 65–99)
Potassium: 3.4 mmol/L — ABNORMAL LOW (ref 3.5–5.1)
SODIUM: 142 mmol/L (ref 135–145)

## 2015-06-04 MED ORDER — METOPROLOL TARTRATE 25 MG PO TABS
12.5000 mg | ORAL_TABLET | Freq: Two times a day (BID) | ORAL | Status: DC
Start: 2015-06-04 — End: 2015-06-05
  Administered 2015-06-04: 12.5 mg via ORAL
  Filled 2015-06-04: qty 1

## 2015-06-04 MED ORDER — ENOXAPARIN SODIUM 40 MG/0.4ML ~~LOC~~ SOLN
40.0000 mg | SUBCUTANEOUS | Status: DC
  Administered 2015-06-07: 40 mg via SUBCUTANEOUS
  Filled 2015-06-04 (×4): qty 0.4

## 2015-06-04 MED ORDER — POTASSIUM CHLORIDE CRYS ER 20 MEQ PO TBCR
40.0000 meq | EXTENDED_RELEASE_TABLET | Freq: Once | ORAL | Status: AC
  Administered 2015-06-04: 40 meq via ORAL
  Filled 2015-06-04: qty 2

## 2015-06-04 NOTE — Progress Notes (Signed)
6 Days Post-Op  Subjective: Doing OK. No new issues.  Objective: Vital signs in last 24 hours: Temp:  [97.8 F (36.6 C)-99.6 F (37.6 C)] 97.8 F (36.6 C) (12/27 0429) Pulse Rate:  [81-95] 81 (12/27 0429) Resp:  [18] 18 (12/27 0429) BP: (163-168)/(44-51) 163/44 mmHg (12/27 0429) SpO2:  [98 %-100 %] 98 % (12/27 0429) Weight:  [69.4 kg (153 lb)] 69.4 kg (153 lb) (12/27 0500) Last BM Date: 06/02/15  Intake/Output from previous day: 12/26 0701 - 12/27 0700 In: 423 [P.O.:420; I.V.:3] Out: 925 [Urine:925] Intake/Output this shift:    Gen: Alert, NAD CV: RR ABD: Soft, NT, ND EXT: right BKA stump incision looks excellent staples intact  Lab Results:   Recent Labs  06/03/15 0600 06/04/15 0403  WBC 8.0 6.6  HGB 8.1* 7.7*  HCT 25.2* 24.1*  PLT 325 321   BMET  Recent Labs  06/03/15 0600 06/04/15 0403  NA 141 142  K 3.8 3.4*  CL 110 112*  CO2 23 24  GLUCOSE 158* 128*  BUN 30* 29*  CREATININE 0.99 1.02*  CALCIUM 8.2* 8.1*   PT/INR No results for input(s): LABPROT, INR in the last 72 hours. ABG  Recent Labs  06/02/15 1320  PHART 7.58*  HCO3 22.5    Studies/Results: No results found.  Anti-infectives: Anti-infectives    Start     Dose/Rate Route Frequency Ordered Stop   05/29/15 1415  vancomycin (VANCOCIN) IVPB 1000 mg/200 mL premix     1,000 mg 200 mL/hr over 60 Minutes Intravenous  Once 05/29/15 1410 05/29/15 1515   05/29/15 1225  ceFAZolin (ANCEF) 2-3 GM-% IVPB SOLR    Comments:  Veda Canning: cabinet override      05/29/15 1225 05/29/15 1803   05/29/15 0208  ceFAZolin (ANCEF) IVPB 2 g/50 mL premix  Status:  Discontinued     2 g 100 mL/hr over 30 Minutes Intravenous On call to O.R. 05/29/15 0208 05/29/15 1411      Assessment/Plan: s/p Procedure(s): AMPUTATION BELOW KNEE (Right)  Continue IM recomendations- much appreciated. Discharge planning for tomorrow    LOS: 6 days    Katha Cabal 06/04/2015

## 2015-06-04 NOTE — Progress Notes (Signed)
[place] at [place] NAME: Shelley Fields    MR#:  [id number]  DATE OF BIRTH:  10-23-1935  SUBJECTIVE:  CHIEF COMPLAINT:  No chief complaint on file.  Patient is awake but unable to get any history. Nursing reports that she has intermittent periods of agitation and is very alert at those times  REVIEW OF SYSTEMS:   ROS unable to assess as she is lethargic  DRUG ALLERGIES:   Allergies  Allergen Reactions  . Contrast Media [Iodinated Diagnostic Agents]     Kidneys are operational at 28% and wont use dyes    VITALS:  Blood pressure 163/44, pulse 81, temperature 97.8 F (36.6 C), temperature source Oral, resp. rate 18, height 5' (1.524 m), weight 69.4 kg (153 lb), SpO2 98 %.  PHYSICAL EXAMINATION:  GENERAL:  79 y.o.-year-old patient lying in the bed with no acute distress. Pale LUNGS: Normal breath sounds bilaterally, no wheezing, rales,rhonchi or crepitation. No use of accessory muscles of respiration.  CARDIOVASCULAR: S1, S2 normal. No murmurs, rubs, or gallops.  ABDOMEN: Soft, nontender, nondistended. Bowel sounds present. No organomegaly or mass.  EXTREMITIES: No pedal edema, cyanosis, or clubbing. Right BKA dressed dry NEUROLOGIC: Patient is lethargic  PSYCHIATRIC: The patient is groggy this morning, oriented to person  SKIN: No obvious rash, lesion, or ulcer.    LABORATORY PANEL:   CBC  Recent Labs Lab 06/04/15 0403  WBC 6.6  HGB 7.7*  HCT 24.1*  PLT 321   ------------------------------------------------------------------------------------------------------------------  Chemistries   Recent Labs Lab 06/04/15 0403  NA 142  K 3.4*  CL 112*  CO2 24  GLUCOSE 128*  BUN 29*  CREATININE 1.02*  CALCIUM 8.1*   ------------------------------------------------------------------------------------------------------------------  Cardiac Enzymes No results for input(s): TROPONINI in the last 168  hours. ------------------------------------------------------------------------------------------------------------------  RADIOLOGY:  No results found.  EKG:   Orders placed or performed during the hospital encounter of 05/15/15  . ED EKG  . ED EKG    ASSESSMENT AND PLAN:   1. 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 Diabetes mellitus -  A1c is 7.5, good control at home - Continue sliding scale and 18 units Levemir    3. Hypertension: Blood pressure elevated - Increased Nrvasc yesterday, added small dose beta blocker today at 12.5 mg by mouth twice a day - Is also on Lasix at home which has been held  4. Chronic systolic heart failure - 2-D echocardiogram 12/7 shows preserved ejection fraction. Possible diastolic dysfunction. - No exacerbation at this time  5. Malnutrition - Encourage by mouth intake  6. Hypernatremia - Resolved  7. Decubitus ulcer of the back - Continue with wound care  8. Hypokalemia - Replace and monitor  9. Anemia: Trending down 8.1-7.7 today - Anemia panel suggestive of AOCD Check CBC in a.m.  All the records are reviewed and case discussed with Care Management/Social Workerr.  CODE STATUS: DO NOT RESUSCITATE  TOTAL TIME TAKING CARE OF THIS PATIENT: 20 MINUTES Greater than 50% of time spent in care coordination and counseling. POSSIBLE D/C IN 2-3 DAYS, DEPENDING ON CLINICAL CONDITION.   Nicholes Mango M.D on 06/04/2015 at 4:04 PM  Between 7am to 6pm - Pager - [phone]  After 6pm go to [email or web address] - password [email or web address] [facility]  Office  [phone]  CC: Primary care physician; [name], MD

## 2015-06-04 NOTE — Progress Notes (Signed)
Anticoagulation monitoring(Lovenox):  79 yo  ordered Lovenox 30 mg Q12h  Filed Weights   06/02/15 0549 06/02/15 0700 06/04/15 0500  Weight: 162 lb (73.483 kg) 142 lb (64.411 kg) 153 lb (69.4 kg)   BMI 29.7  Lab Results  Component Value Date   CREATININE 1.02* 06/04/2015   CREATININE 0.99 06/03/2015   CREATININE 1.07* 06/02/2015   Estimated Creatinine Clearance: 40.2 mL/min (by C-G formula based on Cr of 1.02). Hemoglobin & Hematocrit     Component Value Date/Time   HGB 7.7* 06/04/2015 0403   HGB 9.5* 10/03/2014 1030   HCT 24.1* 06/04/2015 0403   HCT 30.7* 10/03/2014 1030     Per Protocol for Patient with estCrcl > 30 ml/min and BMI < 40, will transition back to Lovenox 40 mg Q24h (original order).

## 2015-06-04 NOTE — Progress Notes (Signed)
Physical Therapy Treatment Patient Details Name: Shelley Fields MRN: [id number] DOB: 1935/09/12 Today's Date: 06/04/2015    History of Present Illness Pt is in long term SNF care and had R LE gangrene and ultimately needing BKA.  She is confused during PT treatment and is AOx1    PT Comments    Patient continues to be very confused. She had a difficult time initiating LE movement due to weakness and confusion. Patient required max VCs and tactile cues for correct exercise technique. PT attempted edge of bed sitting x3 attempts. Patient was Max A for each attempt and demonstrates severe posterior lean with difficult initiating forward sitting. She was unable to sit fully edge of bed due to weakness, impaired balance and posture. Patient would benefit from additional skilled PT intervention to continue to work on bed mobility, LE strengthening in hopes of progressing to transfer training.   Follow Up Recommendations  SNF     Equipment Recommendations       Recommendations for Other Services       Precautions / Restrictions Precautions Precautions: Fall Precaution Comments: R trans-tibial amputation Restrictions Weight Bearing Restrictions: No    Mobility  Bed Mobility Overal bed mobility: Needs Assistance Bed Mobility: Supine to Sit;Sit to Supine     Supine to sit: Max assist Sit to supine: Max assist   General bed mobility comments: PT attempted sitting edge of bed with patient; Patient required max VCs for hand placement and to utilize LLE leg to push through foot for better initiation of bed mobility; Patient required max A for sitting edge of bed x3 attempts. Each time that she obtained sitting position she reports "no, no" and complains of discomfort in neck/back. She also demonstrates no assistance with heavy posterior lean. Patient able to initiate LE movement with bed mobility but is limited due to weakness/discomfort.   Transfers                 General  transfer comment: Pt is not yet safe/ready to try standing.  Ambulation/Gait                 Stairs            Wheelchair Mobility    Modified Rankin (Stroke Patients Only)       Balance Overall balance assessment: Needs assistance Sitting-balance support: No upper extremity supported Sitting balance-Leahy Scale: Zero Sitting balance - Comments: Patient unable to maintain sitting balance today due to weakness and poor posture; She demonstrated a significant posterior lean and required max A to maintain sitting posture. Unable to initiate forward weight shifting;  Postural control: Posterior lean                          Cognition Arousal/Alertness: Awake/alert Behavior During Therapy: Flat affect Overall Cognitive Status: No family/caregiver present to determine baseline cognitive functioning Area of Impairment: Orientation Orientation Level: Disoriented to;Place;Time;Situation             General Comments: slow to respond to simple commands    Exercises General Exercises - Lower Extremity Ankle Circles/Pumps: AROM;Left;10 reps;Supine Short Arc Quad: AROM;Left;10 reps;Supine Heel Slides: AROM;Left;10 reps;Supine;AAROM Hip ABduction/ADduction: Both;10 reps;Supine;AAROM;AROM Straight Leg Raises: AAROM;Both;10 reps;Supine Other Exercises Other Exercises: Patient required max VCs for correct exercise technique including positioning, increase ROM, etc; Patient very slow to initiate LE movement due to fatigue. She required AAROM for most exercise and then was able to progress to AROM in LLE with  last few repetitions. Patient very limited in LE strength during today's treatment session;     General Comments        Pertinent Vitals/Pain Pain Assessment: 0-10 Pain Score: 10-Worst pain ever Pain Location: Pt reports 10/10 foot pain; She reports less pain after exercise Pain Intervention(s): Monitored during session;Repositioned;Limited activity within  patient's tolerance    Home Living                      Prior Function            PT Goals (current goals can now be found in the care plan section) Acute Rehab PT Goals Patient Stated Goal: When asked pt reports a good outcome would be "not doing therapy." PT Goal Formulation: Patient unable to participate in goal setting Time For Goal Achievement: 06/14/15 Potential to Achieve Goals: Poor Progress towards PT goals: Progressing toward goals    Frequency  7X/week    PT Plan Current plan remains appropriate    Co-evaluation             End of Session   Activity Tolerance: Other (comment) (Limited secondary to confusion) Patient left: in bed;with bed alarm set;with call bell/phone within reach     Time: 1015-1043 PT Time Calculation (min) (ACUTE ONLY): 28 min  Charges:  $Therapeutic Exercise: 8-22 mins $Therapeutic Activity: 8-22 mins                    G Codes:      Abdias Hickam PT, DPT 06/04/2015, 11:01 AM

## 2015-06-05 LAB — GLUCOSE, CAPILLARY
GLUCOSE-CAPILLARY: 148 mg/dL — AB (ref 65–99)
Glucose-Capillary: 106 mg/dL — ABNORMAL HIGH (ref 65–99)
Glucose-Capillary: 90 mg/dL (ref 65–99)
Glucose-Capillary: 279 mg/dL — ABNORMAL HIGH (ref 65–99)

## 2015-06-05 LAB — CBC
HEMATOCRIT: 22.8 % — AB (ref 35.0–47.0)
HEMOGLOBIN: 7.2 g/dL — AB (ref 12.0–16.0)
MCH: 26.1 pg (ref 26.0–34.0)
MCHC: 31.8 g/dL — ABNORMAL LOW (ref 32.0–36.0)
MCV: 82.3 fL (ref 80.0–100.0)
Platelets: 337 10*3/uL (ref 150–440)
RBC: 2.77 MIL/uL — AB (ref 3.80–5.20)
RDW: 18 % — ABNORMAL HIGH (ref 11.5–14.5)
WBC: 7 10*3/uL (ref 3.6–11.0)

## 2015-06-05 LAB — CREATININE, SERUM
Creatinine, Ser: 0.93 mg/dL (ref 0.44–1.00)
GFR, EST NON AFRICAN AMERICAN: 58 mL/min — AB (ref >60)

## 2015-06-05 LAB — PREPARE RBC (CROSSMATCH)

## 2015-06-05 MED ORDER — METOPROLOL TARTRATE 25 MG PO TABS: 25.0000 mg | ORAL_TABLET | Freq: Two times a day (BID) | ORAL | Status: DC

## 2015-06-05 MED ORDER — SODIUM CHLORIDE 0.9 % IV SOLN
Freq: Once | INTRAVENOUS | Status: AC
  Administered 2015-06-05: 16:00:00 via INTRAVENOUS

## 2015-06-05 MED ORDER — ALPRAZOLAM 0.5 MG PO TABS: 0.5000 mg | ORAL_TABLET | Freq: Two times a day (BID) | ORAL | Status: DC | PRN

## 2015-06-05 MED ORDER — HYDROCODONE-ACETAMINOPHEN 5-325 MG PO TABS: 1.0000 | ORAL_TABLET | ORAL | Status: DC | PRN

## 2015-06-05 MED ORDER — OXYCODONE HCL 15 MG PO TABS: 15.0000 mg | ORAL_TABLET | ORAL | Status: DC | PRN

## 2015-06-05 MED ORDER — METOPROLOL TARTRATE 25 MG PO TABS
25.0000 mg | ORAL_TABLET | Freq: Two times a day (BID) | ORAL | Status: DC
  Administered 2015-06-05 – 2015-06-07 (×4): 25 mg via ORAL
  Filled 2015-06-05 (×5): qty 1

## 2015-06-05 NOTE — Progress Notes (Signed)
Pt. Hemoglobin down from 7.7 on 12/27 to 7.2 today 12/28. MD aware.

## 2015-06-05 NOTE — Consult Note (Signed)
GI Inpatient Consult Note  Reason for Consult: Rectal Bleed   Attending Requesting Consult: Dr. Delana Meyer  History of Present Illness: Shelley Fields is a 79 y.o. female is being Hospitalized for a below the knee right leg amputation that occurred on December 21. She was due to be discharged today to a skilled nursing facility.  Last night she began to have rectal bleeding.  She had been constipated, was given milk of magnesia as well as enemas, nurse reported a fist size bloody ball of stool occurred close to midnight, continued to have episodes of bright red blood oozing from rectum.  Spoke with Lynann Bologna. RN who reported last episode of bleeding was approximately 230 this morning and that she had not had another bowel movement since then.  When I examined patient had approximately 930 this morning she did have some hard stool that was covered in bright red blood, appeared she was trying to have a bowel movement, she also had bright red blood on her buttocks.  Her hemoglobin on December 25 was 8.6, it it has dropped since yesterday when it was 7.7 today 7.2.  She has had low hemoglobin in the recent past, however in July, 2016 she did have a closer to normal reading of 11.9 and 12.1.  Patient has dementia and was very groggy during my examination.  Medical information taken from previous medical records and interviewing nurse.  Past Medical History:  Past Medical History  Diagnosis Date  . Diabetes mellitus without complication ([place])   . C. difficile diarrhea   . Uterine cancer ([place])   . Hypogammaglobulinemia ([place])   . Pulmonary embolism ([place])   . Decubitus ulcers   . Hypertension   . Hypercholesterolemia   . Incontinence of urine   . Cancer [facility])     Endometrial Cancer  . Cervical cancer ([place])     with radiation 45 years ago...  . Arthritis   . GERD (gastroesophageal reflux disease)   . Bladder cancer ([place])   . Decubitus ulcer of buttock, stage 1   . Peripheral vascular  disease ([place])   . Anemia   . Coronary artery disease   . CHF (congestive heart failure) ([place])   . Shortness of breath dyspnea   . Hypoxia   . Gangrene of foot ([place])     right  . Myocardial infarction [facility])     nstemi 12/16  . Chronic kidney disease     acute on chronic renal failure 12/16  . Dementia     Problem List: Patient Active Problem List   Diagnosis Date Noted  . Malnutrition of moderate degree 05/31/2015  . Atherosclerosis of aorta with gangrene ([place]) 05/29/2015  . NSTEMI (non-ST elevated myocardial infarction) ([place]) 05/15/2015  . Chronic systolic CHF (congestive heart failure) ([place]) 05/15/2015  . Chronic anticoagulation 04/21/2015  . B12 deficiency 03/18/2015  . Anemia 02/17/2015  . Weight loss 02/17/2015  . Type 2 diabetes mellitus with hyperglycemia ([place]) 12/12/2014  . Decubitus ulcer of back, stage 4 ([place]) 12/12/2014  . HTN (hypertension) 12/12/2014  . Anorexia 11/23/2014  . Encounter for other specified administrative purpose 07/10/2013  . Candidal vulvovaginitis 10/14/2012  . Long term current use of opiate analgesic 06/23/2012  . Breast lump 06/10/2012  . Diagnostic skin and sensitization tests 04/12/2012  . Bladder infection, chronic 03/23/2012  . Total urinary incontinence 03/23/2012  . 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 Female genuine stress incontinence 03/23/2012  . Disorder of bladder function 03/23/2012  . Incomplete bladder  emptying 03/23/2012  . 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 Cystitis, radiation 03/23/2012  . Cancer of body of uterus ([place]) 03/23/2012  . Cancer of corpus uteri, except isthmus ([place]) 03/23/2012  . Bladder neoplasm of uncertain malignant potential 03/23/2012  . Colon polyp 01/06/2012  . Difficulty in walking 01/06/2012  . Deep vein thrombosis ([place]) 01/06/2012  . Cancer of endometrium ([place]) 01/06/2012  . Contact with and exposure to tuberculosis 01/06/2012  . Angiopathy, peripheral ([place]) 01/06/2012  . Abnormal  presence of protein in urine 01/06/2012  . Retinopathy 01/06/2012    Past Surgical History: Past Surgical History  Procedure Laterality Date  . Total abdominal hysterectomy w/ bilateral salpingoophorectomy  07/29/2010  . Stent in leg  2016  . Cataract extraction, bilateral    . Bladder surgery    . Femoral endarterectomy      right  . Leg surgery      right leg  . Portacath placement      right  . Esophagogastroduodenoscopy N/A 12/14/2014    Procedure: ESOPHAGOGASTRODUODENOSCOPY (EGD);  Surgeon: Hulen Luster, MD;  Location: [facility] ENDOSCOPY;  Service: Endoscopy;  Laterality: N/A;  . Peripheral vascular catheterization Right 04/05/2015    Procedure: Lower Extremity Angiography;  Surgeon: Katha Cabal, MD;  Location: [place] CV LAB;  Service: Cardiovascular;  Laterality: Right;  . Peripheral vascular catheterization  04/05/2015    Procedure: Lower Extremity Intervention;  Surgeon: Katha Cabal, MD;  Location: [place] CV LAB;  Service: Cardiovascular;;  . Peripheral vascular catheterization Right 05/14/2015    Procedure: Lower Extremity Angiography;  Surgeon: Katha Cabal, MD;  Location: [place] CV LAB;  Service: Cardiovascular;  Laterality: Right;  . Peripheral vascular catheterization Right 05/14/2015    Procedure: Lower Extremity Intervention;  Surgeon: Katha Cabal, MD;  Location: [place] CV LAB;  Service: Cardiovascular;  Laterality: Right;  . Angioplasty    . Amputation Right 05/29/2015    Procedure: AMPUTATION BELOW KNEE;  Surgeon: Katha Cabal, MD;  Location: [facility] ORS;  Service: Vascular;  Laterality: Right;    Allergies: Allergies  Allergen Reactions  . Contrast Media [Iodinated Diagnostic Agents]     Kidneys are operational at 28% and wont use dyes    Home Medications: Prescriptions prior to admission  Medication Sig Dispense Refill Last Dose  . ALPRAZolam (XANAX) 0.5 MG tablet Take 1 tablet (0.5 mg total) by mouth 2 (two) times  daily as needed for anxiety. 20 tablet 0 05/29/2015 at Unknown time  . amLODipine (NORVASC) 5 MG tablet TAKE 1 TABLET BY MOUTH EVERY DAY 90 tablet 3 05/28/2015 at Unknown time  . aspirin 81 MG tablet Take 81 mg by mouth daily.   05/28/2015 at Unknown time  . atorvastatin (LIPITOR) 40 MG tablet Take 1 tablet (40 mg total) by mouth daily at 6 PM. 30 tablet 0 05/28/2015 at Unknown time  . docusate sodium (COLACE) 100 MG capsule Take 1 capsule (100 mg total) by mouth 2 (two) times daily. 10 capsule 0 05/28/2015 at Unknown time  . furosemide (LASIX) 20 MG tablet Take 1 tablet (20 mg total) by mouth daily. 30 tablet 0 05/28/2015 at Unknown time  . insulin detemir (LEVEMIR) 100 UNIT/ML injection Inject 0.15 mLs (15 Units total) into the skin at bedtime. 10 mL 11 05/28/2015 at Unknown time  . ketoconazole (NIZORAL) 2 % cream Apply 1 application topically 2 (two) times daily.   05/28/2015 at Unknown time  . magnesium oxide (MAG-OX) 400 MG tablet Take  400 mg by mouth daily.   05/28/2015 at Unknown time  . megestrol (MEGACE ES) 625 MG/5ML suspension Take 1.6 mLs (200 mg total) by mouth daily. to stimulate appetite 150 mL 0 05/28/2015 at Unknown time  . mirtazapine (REMERON) 15 MG tablet Take 15 mg by mouth at bedtime.    05/28/2015 at Unknown time  . mupirocin ointment (BACTROBAN) 2 %    Taking  . oxyCODONE (ROXICODONE) 15 MG immediate release tablet Take 1 tablet (15 mg total) by mouth every 6 (six) hours as needed for severe pain. 30 tablet 0 05/29/2015 at Unknown time  . promethazine (PHENERGAN) 25 MG tablet Take 25 mg by mouth every 8 (eight) hours as needed for nausea or vomiting.   Taking  . risperiDONE (RISPERDAL) 0.25 MG tablet Take 1 tablet (0.25 mg total) by mouth at bedtime. 10 tablet 0 05/28/2015 at Unknown time  . sertraline (ZOLOFT) 25 MG tablet Take 1 tablet (25 mg total) by mouth daily. 30 tablet 0 05/28/2015 at Unknown time  . tamsulosin (FLOMAX) 0.4 MG CAPS capsule Take 0.4 mg by mouth 2 (two)  times daily.   05/28/2015 at Unknown time   Home medication reconciliation was completed with the patient.   Scheduled Inpatient Medications:   . amLODipine  10 mg Oral Daily  . aspirin EC  81 mg Oral Daily  . atorvastatin  40 mg Oral q1800  . collagenase   Topical Daily  . docusate  100 mg Oral BID  . enoxaparin (LOVENOX) injection  40 mg Subcutaneous Q24H  . feeding supplement  1 Container Oral TID BM  . insulin aspart  0-5 Units Subcutaneous QHS  . insulin aspart  0-9 Units Subcutaneous TID WC  . insulin detemir  18 Units Subcutaneous QHS  . magnesium oxide  400 mg Oral Daily  . metoprolol tartrate  25 mg Oral BID  . mirtazapine  15 mg Oral QHS  . risperiDONE  0.25 mg Oral QHS  . sertraline  25 mg Oral Daily  . sodium chloride  3 mL Intravenous Q12H  . tamsulosin  0.4 mg Oral BID    Continuous Inpatient Infusions:     PRN Inpatient Medications:  acetaminophen **OR** acetaminophen, ALPRAZolam, alum & mag hydroxide-simeth, HYDROcodone-acetaminophen, magnesium hydroxide, ondansetron **OR** ondansetron (ZOFRAN) IV, promethazine, sodium phosphate, sorbitol  Family History: family history includes CAD in her father; Cancer in her brother; Coronary artery disease in her mother; Diabetes Mellitus II in her brother, father, mother, and sister; Hypertension in her brother, father, mother, and sister.    Social History:   reports that she has never smoked. She does not have any smokeless tobacco history on file. She reports that she does not drink alcohol or use illicit drugs.   Review of Systems: Unable to assess due to dementia    Physical Examination: BP 155/49 mmHg  Pulse 86  Temp(Src) 98.6 F (37 C) (Oral)  Resp 18  Ht 5' (1.524 m)  Wt 69.1 kg (152 lb 5.4 oz)  BMI 29.75 kg/m2  SpO2 99% Gen: patient very groggy during exam, medical info from nurse and medical records HEENT: PEERLA, EOMI, Neck: supple, no JVD or thyromegaly Chest: CTA bilaterally, no wheezes, crackles,  or other adventitious sounds CV: RRR, no m/g/c/r Abd: soft, NT, ND, +BS in all four quadrants; no HSM, guarding, ridigity, or rebound tenderness Ext: no edema, well perfused with 2+ pulses, Skin: decubitus ulcer covered Lymph: no LAD Rectal; hard stool present with bright red blood covering it and  on buttocks  Data: Lab Results  Component Value Date   WBC 7.0 06/05/2015   HGB 7.2* 06/05/2015   HCT 22.8* 06/05/2015   MCV 82.3 06/05/2015   PLT 337 06/05/2015    Recent Labs Lab 06/03/15 0600 06/04/15 0403 06/05/15 0253  HGB 8.1* 7.7* 7.2*   Lab Results  Component Value Date   NA 142 06/04/2015   K 3.4* 06/04/2015   CL 112* 06/04/2015   CO2 24 06/04/2015   BUN 29* 06/04/2015   CREATININE 0.93 06/05/2015   Lab Results  Component Value Date   ALT 62* 05/15/2015   AST 105* 05/15/2015   ALKPHOS 32* 05/15/2015   BILITOT 0.2* 05/15/2015    Recent Labs Lab 05/29/15 1341  APTT 38*  INR 1.92    Imaging:   Assessment/Plan: Ms. Ferrin is a 79 y.o. female with rectal bleeding, possibly from previous impaction, hemorrhoidal irritation.  Hgb is currently trending down now 7.2.    Recommendations: We recommend she be given a unit of blood with HGB check 1 hour later.  Continue with oral stool softeners, hold lovenox.  We also recommend Anusol suppositories QHS for 4 or 5 days  Thank you for the consult. Please call with questions or concerns.  Salvadore Farber, PA-C  I personally performed these services.

## 2015-06-05 NOTE — Progress Notes (Signed)
Spoke with patient's daughter and notified her that patient may be discharged prior to blood transfusion, but that nurse would call daughter back upon transfer of patient by EMS.

## 2015-06-05 NOTE — Progress Notes (Addendum)
Notified Dr. Vira Agar that patient had had increased bleeding from the rectum which had appeared to be clotting off earlier in the day. Dr. Vira Agar advised that patient should stay over night and possibly discharge tomorrow. Dr Vira Agar wanted to be called with Hemoglobin results. Will notify night RN. Notified Dr. Delana Meyer of this who said to put discharge on hold. Also notified Dr. Margaretmary Eddy that patient would not be discharged. Will notify night shift RN that upon completion of blood order should be placed to check hemogloin 1 hour post blood transfusion. Notified Kathlee Nations patient's daughter that she would not be discharged tonight. Also called [facility] that patient would not be transporting tonight.

## 2015-06-05 NOTE — Progress Notes (Signed)
7 Days Post-Op  Subjective: Doing OK. No new issues.  Objective: Vital signs in last 24 hours: Temp:  [97.6 F (36.4 C)-98.7 F (37.1 C)] 98.5 F (36.9 C) (12/28 2030) Pulse Rate:  [83-97] 91 (12/28 2030) Resp:  [14-24] 20 (12/28 2030) BP: (133-174)/(37-62) 147/37 mmHg (12/28 2030) SpO2:  [98 %-100 %] 100 % (12/28 2030) Weight:  [69.1 kg (152 lb 5.4 oz)] 69.1 kg (152 lb 5.4 oz) (12/28 0526) Last BM Date: 06/05/15  Intake/Output from previous day: 12/27 0701 - 12/28 0700 In: 0  Out: 500 [Urine:500] Intake/Output this shift: Total I/O In: 320 [Blood:320] Out: -   Gen: Alert, NAD CV: RR ABD: Soft, NT, ND EXT: right BKA stump incision looks excellent staples intact  Lab Results:   Recent Labs  06/04/15 0403 06/05/15 0253  WBC 6.6 7.0  HGB 7.7* 7.2*  HCT 24.1* 22.8*  PLT 321 337   BMET  Recent Labs  06/03/15 0600 06/04/15 0403 06/05/15 0253  NA 141 142  --   K 3.8 3.4*  --   CL 110 112*  --   CO2 23 24  --   GLUCOSE 158* 128*  --   BUN 30* 29*  --   CREATININE 0.99 1.02* 0.93  CALCIUM 8.2* 8.1*  --    PT/INR No results for input(s): LABPROT, INR in the last 72 hours. ABG No results for input(s): PHART, HCO3 in the last 72 hours.  Invalid input(s): PCO2, PO2  Studies/Results: No results found.  Anti-infectives: Anti-infectives    Start     Dose/Rate Route Frequency Ordered Stop   05/29/15 1415  vancomycin (VANCOCIN) IVPB 1000 mg/200 mL premix     1,000 mg 200 mL/hr over 60 Minutes Intravenous  Once 05/29/15 1410 05/29/15 1515   05/29/15 1225  ceFAZolin (ANCEF) 2-3 GM-% IVPB SOLR    Comments:  Veda Canning: cabinet override      05/29/15 1225 05/29/15 1803   05/29/15 0208  ceFAZolin (ANCEF) IVPB 2 g/50 mL premix  Status:  Discontinued     2 g 100 mL/hr over 30 Minutes Intravenous On call to O.R. 05/29/15 0208 05/29/15 1411      Assessment/Plan: s/p Procedure(s): AMPUTATION BELOW KNEE (Right)  Continue IM recomendations- much  appreciated. Discharge planning on hold secondary to GI findings   LOS: 7 days    Katha Cabal 06/05/2015

## 2015-06-05 NOTE — Progress Notes (Signed)
[place] [facility] [place] NAME: Shelley Fields    MR#:  [id number]  DATE OF BIRTH:  07/10/1935  SUBJECTIVE:  CHIEF COMPLAINT:  No chief complaint on file.  Patient is awake but unable to get any history. Nursing reports that she has intermittent periods of agitation and is very alert at those times  REVIEW OF SYSTEMS:   ROS unable to get  DRUG ALLERGIES:   Allergies  Allergen Reactions  . Contrast Media [Iodinated Diagnostic Agents]     Kidneys are operational at 28% and wont use dyes    VITALS:  Blood pressure 155/49, pulse 86, temperature 98.6 F (37 C), temperature source Oral, resp. rate 18, height 5' (1.524 m), weight 69.1 kg (152 lb 5.4 oz), SpO2 99 %.  PHYSICAL EXAMINATION:  GENERAL:  79 y.o.-year-old patient lying in the bed with no acute distress. Pale LUNGS: Normal breath sounds bilaterally, no wheezing, rales,rhonchi or crepitation. No use of accessory muscles of respiration.  CARDIOVASCULAR: S1, S2 normal. No murmurs, rubs, or gallops.  ABDOMEN: Soft, nontender, nondistended. Bowel sounds present. No organomegaly or mass.  EXTREMITIES: No pedal edema, cyanosis, or clubbing. Right BKA dressed dry NEUROLOGIC: Patient is lethargic  PSYCHIATRIC: The patient is groggy this morning, oriented to person  SKIN: No obvious rash, lesion, or ulcer.    LABORATORY PANEL:   CBC  Recent Labs Lab 06/05/15 0253  WBC 7.0  HGB 7.2*  HCT 22.8*  PLT 337   ------------------------------------------------------------------------------------------------------------------  Chemistries   Recent Labs Lab 06/04/15 0403 06/05/15 0253  NA 142  --   K 3.4*  --   CL 112*  --   CO2 24  --   GLUCOSE 128*  --   BUN 29*  --   CREATININE 1.02* 0.93  CALCIUM 8.1*  --    ------------------------------------------------------------------------------------------------------------------  Cardiac Enzymes No results for input(s):  TROPONINI in the last 168 hours. ------------------------------------------------------------------------------------------------------------------  RADIOLOGY:  No results found.  EKG:   Orders placed or performed during the hospital encounter of 05/15/15  . ED EKG  . ED EKG    ASSESSMENT AND PLAN:   1. 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 Diabetes mellitus -  A1c is 7.5, good control at home - Continue sliding scale and 18 units Levemir    3. Hypertension: Blood pressure elevated - Increased Nrvasc yesterday, added small dose beta blocker increased to 25  mg by mouth twice a day - Is also on Lasix at home which has been held  4. Chronic systolic heart failure - 2-D echocardiogram 12/7 shows preserved ejection fraction. Possible diastolic dysfunction. - No exacerbation at this time  5. Malnutrition - Encourage by mouth intake  6. Hypernatremia - Resolved  7. Decubitus ulcer of the back - Continue with wound care  8. Hypokalemia - Replace and monitor  9. Anemia: Trending down 8.1-7.7--> 7.2 today - Anemia panel suggestive of AOCD  All the records are reviewed and case discussed with Care Management/Social Workerr.  CODE STATUS: DO NOT RESUSCITATE  TOTAL TIME TAKING CARE OF THIS PATIENT: 5 MINUTES  Patient is getting discharged today. We will sign off  Nicholes Mango M.D on 06/05/2015 at 3:25 PM  Between 7am to 6pm - Pager - [phone]  After 6pm go to [email or web address] - password [email or web address]  Office  [phone]  CC: Primary care physician; [name], MD

## 2015-06-05 NOTE — Clinical Social Work Note (Signed)
Patient's discharge will be later this evening as patient still has a blood transfusion to receive. CSW has notified Helene Kelp at [facility] and she has stated that this is fine. CSW has touched base with patient's daughter and she is aware as well that patient's discharge may be a late discharge and may not take place for another 3-4 hours.  Shela Leff MSW,LCSW [phone]

## 2015-06-05 NOTE — Progress Notes (Signed)
Per Dr. Vira Agar okay to give patient aspirin hold lovenox for rectal bleeding

## 2015-06-05 NOTE — Discharge Summary (Addendum)
[facility]    Discharge Summary    Patient ID:  Shelley Fields MRN: [id number] DOB/AGE: Mar 30, 1936 79 y.o.  Admit date: 05/29/2015 Discharge date: 06/05/2015 Date of Surgery: 05/29/2015 Surgeon: Surgeon(s): Katha Cabal, MD  Admission Diagnosis: ASO With GANGRENE  Discharge Diagnoses:  ASO With GANGRENE  Secondary Diagnoses: Past Medical History  Diagnosis Date  . Diabetes mellitus without complication (Shelley Fields)   . C. difficile diarrhea   . Uterine cancer (Shelley Fields)   . Hypogammaglobulinemia (Shelley Fields)   . Pulmonary embolism (Shelley Fields)   . Decubitus ulcers   . Hypertension   . Hypercholesterolemia   . Incontinence of urine   . Cancer [facility])     Endometrial Cancer  . Cervical cancer (Shelley Fields)     with radiation 45 years ago...  . Arthritis   . GERD (gastroesophageal reflux disease)   . Bladder cancer (Shelley Fields)   . Decubitus ulcer of buttock, stage 1   . Peripheral vascular disease (Shelley Fields)   . Anemia   . Coronary artery disease   . CHF (congestive heart failure) ([name])   . Shortness of breath dyspnea   . Hypoxia   . Gangrene of foot (Shelley Fields)     right  . Myocardial infarction [facility])     nstemi 12/16  . Chronic kidney disease     acute on chronic renal failure 12/16  . Dementia     Procedure(s): AMPUTATION BELOW KNEE  Discharged Condition: fair  HPI:  The patient is a 79 year old admitted postoperatively, the day of surgery. She underwent right below-knee amputationon 05/29/2015.  Postoperatively her course has been fairly unremarkable. At this Fields she has undergone her initial dressing change and her wound is clean dry and intact appears to be healing very well. She is tolerating a regular ADA diet she is working with therapy medicine has been consult to manage her medical comorbidities including diabetes. She presented to the hospital with a decubitus this is not something that developed during this hospitalization. She also presented with a chronic  indwelling Foley.  The morning of discharge she was noted to have some blood in the rectal area gastroenterology saw her did not feel that there was a significant GI bleed however her hemoglobin was less than 8 likely from anemia of chronic disease as well as post amputation and surgery and therefore she was held over for a blood transfusion and then observe for 24 more hours. Posttransfusion her hemoglobin was 8.8 this morning her hemoglobin is 9.0. She is fit for discharge. No new changes in her post hospitalization orders.  Hospital Course:  DARRIEL SCHALL is a 79 y.o. female is S/P Right below-knee amputation  Procedure(s): AMPUTATION BELOW KNEE Extubated: POD # 0 Physical eright below-knee amputation clean dry and intact decubitus as noted this was present on admission chronic indwelling Foleyost-op wounds clean, dry, intact or healing well Pt. Ambulating, voiding and taking PO diet without difficulty. Pt pain controlled with PO pain meds. Labs as below Complications:none  Consults:  Treatment Team:  Harrie Foreman, MD Aldean Jewett, MD  Significant Diagnostic Studies: CBC Lab Results  Component Value Date   WBC 7.0 06/05/2015   HGB 7.2* 06/05/2015   HCT 22.8* 06/05/2015   MCV 82.3 06/05/2015   PLT 337 06/05/2015    BMET    Component Value Date/Time   NA 142 06/04/2015 0403   NA 136 07/13/2014 1022   K 3.4* 06/04/2015 0403   K 4.8 07/13/2014 1022  CL 112* 06/04/2015 0403   CL 101 07/13/2014 1022   CO2 24 06/04/2015 0403   CO2 26 07/13/2014 1022   GLUCOSE 128* 06/04/2015 0403   GLUCOSE 218* 07/13/2014 1022   BUN 29* 06/04/2015 0403   BUN 27* 09/04/2014 1113   CREATININE 0.93 06/05/2015 0253   CREATININE 1.20* 10/03/2014 1030   CALCIUM 8.1* 06/04/2015 0403   CALCIUM 8.2* 07/13/2014 1022   GFRNONAA 58* 06/05/2015 0253   GFRNONAA 44* 10/03/2014 1030   GFRNONAA 31* 07/13/2014 1022   GFRAA >60 06/05/2015 0253   GFRAA 50* 10/03/2014 1030   GFRAA 37*  07/13/2014 1022   COAG Lab Results  Component Value Date   INR 1.92 05/29/2015   INR 1.78 05/20/2015   INR 1.62 05/15/2015     Disposition:  Discharge to :Rehab    Medication List    ASK your doctor about these medications        ALPRAZolam 0.5 MG tablet  Commonly known as:  XANAX  Take 1 tablet (0.5 mg total) by mouth 2 (two) times daily as needed for anxiety.     amLODipine 5 MG tablet  Commonly known as:  NORVASC  TAKE 1 TABLET BY MOUTH EVERY DAY     aspirin 81 MG tablet  Take 81 mg by mouth daily.     atorvastatin 40 MG tablet  Commonly known as:  LIPITOR  Take 1 tablet (40 mg total) by mouth daily at 6 PM.     docusate sodium 100 MG capsule  Commonly known as:  COLACE  Take 1 capsule (100 mg total) by mouth 2 (two) times daily.     furosemide 20 MG tablet  Commonly known as:  LASIX  Take 1 tablet (20 mg total) by mouth daily.     insulin detemir 100 UNIT/ML injection  Commonly known as:  LEVEMIR  Inject 0.15 mLs (15 Units total) into the skin at bedtime.     ketoconazole 2 % cream  Commonly known as:  NIZORAL  Apply 1 application topically 2 (two) times daily.     magnesium oxide 400 MG tablet  Commonly known as:  MAG-OX  Take 400 mg by mouth daily.     megestrol 625 MG/5ML suspension  Commonly known as:  MEGACE ES  Take 1.6 mLs (200 mg total) by mouth daily. to stimulate appetite     mirtazapine 15 MG tablet  Commonly known as:  REMERON  Take 15 mg by mouth at bedtime.     mupirocin ointment 2 %  Commonly known as:  BACTROBAN     oxyCODONE 15 MG immediate release tablet  Commonly known as:  ROXICODONE  Take 1 tablet (15 mg total) by mouth every 6 (six) hours as needed for severe pain.     promethazine 25 MG tablet  Commonly known as:  PHENERGAN  Take 25 mg by mouth every 8 (eight) hours as needed for nausea or vomiting.     risperiDONE 0.25 MG tablet  Commonly known as:  RISPERDAL  Take 1 tablet (0.25 mg total) by mouth at bedtime.      sertraline 25 MG tablet  Commonly known as:  ZOLOFT  Take 1 tablet (25 mg total) by mouth daily.     tamsulosin 0.4 MG Caps capsule  Commonly known as:  FLOMAX  Take 0.4 mg by mouth 2 (two) times daily.       Verbal and written Discharge instructions given to the patient. Wound care per Discharge AVS  Signed: Katha Cabal, MD  06/05/2015, 7:37 AM

## 2015-06-05 NOTE — Progress Notes (Signed)
Per Dr. Margaretmary Eddy place order for CBC tomorrow morning

## 2015-06-05 NOTE — Progress Notes (Signed)
Pt had sm BM around 22:30 at dressing change. Around midnight the tech. noted that Pt. had a fist size bloody ball of stool, she was cleaned up and no more blood noted. Around 02:20 tech called me to the room as blood was noted coming from the rectum. On assessment I found a moderate  frank red blood on the bed pads and more oozing from the rectum  when the butt is separated for further inspection. MD notified and orders for CBC to be done early was given. Continued to monitor.

## 2015-06-05 NOTE — Care Management Important Message (Signed)
Important Message  Patient Details  Name: Shelley Fields MRN: [id number] Date of Birth: 1936/03/22   Medicare Important Message Given:  Yes    Alvie Heidelberg, RN 06/05/2015, 10:07 AM

## 2015-06-05 NOTE — Clinical Social Work Note (Signed)
Patient to discharge today to [facility] but is to have a blood transfusion first. Patient will be a late admission today to [facility] and CSW has informed Helene Kelp at [facility] and they are ok with this. CSW has sent discharge information to [facility]. Shela Leff MSW,LCSW [phone]

## 2015-06-05 NOTE — Progress Notes (Signed)
Per Dr. Vira Agar place order for 1 unit of blood to be given over 3-4 hours with hemoglobin to be checked 1 hour afterwards.

## 2015-06-06 ENCOUNTER — Telehealth: Payer: Self-pay | Admitting: *Deleted

## 2015-06-06 LAB — CBC
HCT: 27.5 % — ABNORMAL LOW (ref 35.0–47.0)
Hemoglobin: 8.9 g/dL — ABNORMAL LOW (ref 12.0–16.0)
MCH: 26.3 pg (ref 26.0–34.0)
MCHC: 32.3 g/dL (ref 32.0–36.0)
MCV: 81.3 fL (ref 80.0–100.0)
Platelets: 382 10*3/uL (ref 150–440)
RBC: 3.38 MIL/uL — ABNORMAL LOW (ref 3.80–5.20)
RDW: 17.6 % — AB (ref 11.5–14.5)
WBC: 8 10*3/uL (ref 3.6–11.0)

## 2015-06-06 LAB — GLUCOSE, CAPILLARY
GLUCOSE-CAPILLARY: 161 mg/dL — AB (ref 65–99)
GLUCOSE-CAPILLARY: 165 mg/dL — AB (ref 65–99)
Glucose-Capillary: 162 mg/dL — ABNORMAL HIGH (ref 65–99)
Glucose-Capillary: 206 mg/dL — ABNORMAL HIGH (ref 65–99)
Glucose-Capillary: 314 mg/dL — ABNORMAL HIGH (ref 65–99)

## 2015-06-06 LAB — TYPE AND SCREEN
ABO/RH(D): A POS
ANTIBODY SCREEN: NEGATIVE
Unit division: 0

## 2015-06-06 LAB — HEMOGLOBIN AND HEMATOCRIT, BLOOD
HEMATOCRIT: 28.7 % — AB (ref 35.0–47.0)
HEMATOCRIT: 27.2 % — AB (ref 35.0–47.0)
HEMATOCRIT: 27.3 % — AB (ref 35.0–47.0)
HEMOGLOBIN: 9.4 g/dL — AB (ref 12.0–16.0)
HEMOGLOBIN: 8.8 g/dL — AB (ref 12.0–16.0)
HEMOGLOBIN: 8.8 g/dL — AB (ref 12.0–16.0)

## 2015-06-06 NOTE — Consult Note (Signed)
Patient has not had any further bleeding since a small smear this morning.  Hgb stable.  From a GI stand point she can be discharged tomorrow.

## 2015-06-06 NOTE — Progress Notes (Signed)
[place] at [place] NAME: Shelley Fields    MR#:  [id number]  DATE OF BIRTH:  1935-06-28  SUBJECTIVE:  CHIEF COMPLAINT:  No chief complaint on file.  Patient is awake but unable to get any history. Stool with little blood today, pt can be d/ced from GI standpoint  REVIEW OF SYSTEMS:   ROS unable to get  DRUG ALLERGIES:   Allergies  Allergen Reactions  . Contrast Media [Iodinated Diagnostic Agents]     Kidneys are operational at 28% and wont use dyes    VITALS:  Blood pressure 157/72, pulse 87, temperature 98.1 F (36.7 C), temperature source Oral, resp. rate 18, height 5' (1.524 m), weight 67.586 kg (149 lb), SpO2 100 %.  PHYSICAL EXAMINATION:  GENERAL:  79 y.o.-year-old patient lying in the bed with no acute distress. Pale LUNGS: Normal breath sounds bilaterally, no wheezing, rales,rhonchi or crepitation. No use of accessory muscles of respiration.  CARDIOVASCULAR: S1, S2 normal. No murmurs, rubs, or gallops.  ABDOMEN: Soft, nontender, nondistended. Bowel sounds present. No organomegaly or mass.  EXTREMITIES: No pedal edema, cyanosis, or clubbing. Right BKA dressed dry NEUROLOGIC: Patient is altered PSYCHIATRIC: The patient is oriented to person  SKIN: No obvious rash, lesion, or ulcer.    LABORATORY PANEL:   CBC  Recent Labs Lab 06/06/15 0514  06/06/15 1723  WBC 8.0  --   --   HGB 8.9*  < > 8.8*  HCT 27.5*  < > 27.3*  PLT 382  --   --   < > = values in this interval not displayed. ------------------------------------------------------------------------------------------------------------------  Chemistries   Recent Labs Lab 06/04/15 0403 06/05/15 0253  NA 142  --   K 3.4*  --   CL 112*  --   CO2 24  --   GLUCOSE 128*  --   BUN 29*  --   CREATININE 1.02* 0.93  CALCIUM 8.1*  --     ------------------------------------------------------------------------------------------------------------------  Cardiac Enzymes No results for input(s): TROPONINI in the last 168 hours. ------------------------------------------------------------------------------------------------------------------  RADIOLOGY:  No results found.  EKG:   Orders placed or performed during the hospital encounter of 05/15/15  . ED EKG  . ED EKG    ASSESSMENT AND PLAN:   1. 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 Diabetes mellitus -  A1c is 7.5, good control at home - Continue sliding scale and 18 units Levemir    3. Hypertension: Blood pressure better - Increased Nrvasc yesterday, added e beta blocker increased to 25  mg by mouth twice a day - Is also on Lasix at home which has been held  4. Chronic systolic heart failure - 2-D echocardiogram 12/7 shows preserved ejection fraction. Possible diastolic dysfunction. - No exacerbation at this time  5. Malnutrition - Encourage by mouth intake  6. Hypernatremia - Resolved  7. Decubitus ulcer of the back - Continue with wound care  8. Hypokalemia - Replace and monitor  9. Anemia 2/2 gi bleed from hemarrhoids on AOCD : HB /HCT Stable, anusol - ok to d/c pt from GI standpoing  All the records are reviewed and case discussed with Care Management/Social Workerr.  CODE STATUS: DO NOT RESUSCITATE  TOTAL TIME TAKING CARE OF THIS PATIENT: 42 MINUTES  Patient is getting discharged today. We will sign off  Nicholes Mango M.D on 06/06/2015 at 8:04 PM  Between 7am to 6pm - Pager - [phone]  After  6pm go to [email or web address] - password [email or web address] [facility]  Office  [phone]  CC: Primary care physician; [name], MD

## 2015-06-06 NOTE — Progress Notes (Signed)
Physical Therapy Treatment Patient Details Name: Shelley Fields MRN: [id number] DOB: 09-17-1935 Today's Date: 06/06/2015    History of Present Illness Pt is in long term SNF care and had R LE gangrene and ultimately needing BKA.  She is confused during PT treatment and is AOx1    PT Comments    Spoke with nursing prior to treatment for appropriate level of therapy. Nursing states pt bed level appropriate only. Pt extremely lethargic and does not really awaken fully. Does deny pain then has no further communication. Pt only opens eyes several times, but does not actively participate in lower extremity exercises. Performed passive range of motion to left lower extremity and right hip/knee. Spoke with nursing post session regarding session. Tentative plan is for pt to discharge tomorrow. Continue PT for hopeful increased participation for active assisted range of motion bilateral lower extremities to improve overall function/mobility.   Follow Up Recommendations  SNF     Equipment Recommendations       Recommendations for Other Services       Precautions / Restrictions Restrictions Other Position/Activity Restrictions: RLE BKA    Mobility  Bed Mobility               General bed mobility comments: Not tested; spoke with nursing prior to session who notes only bed level appropriate  Transfers                    Ambulation/Gait                 Stairs            Wheelchair Mobility    Modified Rankin (Stroke Patients Only)       Balance                                    Cognition Arousal/Alertness: Lethargic (Awakens several times/opens eyes, but no other responses) Behavior During Therapy: Flat affect (does not participate actively) Overall Cognitive Status: No family/caregiver present to determine baseline cognitive functioning                      Exercises General Exercises - Lower Extremity Ankle Circles/Pumps:  PROM;Left;20 reps;Sidelying Quad Sets:  (unable to awaken enough to follow instructions) Long Arc Quad: PROM;Both;10 reps;Sidelying (partial R sidelying) Heel Slides: PROM;Both;10 reps;Sidelying (partial R sidelying) Hip ABduction/ADduction: PROM;Left;10 reps;Sidelying (partial R sidelying)    General Comments        Pertinent Vitals/Pain Pain Assessment: No/denies pain    Home Living                      Prior Function            PT Goals (current goals can now be found in the care plan section) Progress towards PT goals: Not progressing toward goals - comment    Frequency  7X/week    PT Plan Current plan remains appropriate    Co-evaluation             End of Session   Activity Tolerance: Patient limited by lethargy (does not actively participate) Patient left: in bed;with call bell/phone within reach     Time: 1155-1208 PT Time Calculation (min) (ACUTE ONLY): 13 min  Charges:  $Therapeutic Exercise: 8-22 mins  G Codes:      Charlaine Dalton 06/06/2015, 12:22 PM

## 2015-06-06 NOTE — Progress Notes (Signed)
8 Days Post-Op  Subjective: Doing OK. No new issues.  Objective: Vital signs in last 24 hours: Temp:  [98.1 F (36.7 C)-99.9 F (37.7 C)] 99.9 F (37.7 C) (12/29 2010) Pulse Rate:  [86-91] 91 (12/29 2010) Resp:  [16-18] 18 (12/29 2010) BP: (140-157)/(42-72) 155/42 mmHg (12/29 2010) SpO2:  [95 %-100 %] 98 % (12/29 2010) Weight:  [67.586 kg (149 lb)] 67.586 kg (149 lb) (12/29 0607) Last BM Date: 06/06/15  Intake/Output from previous day: 12/28 0701 - 12/29 0700 In: 328 [I.V.:8; Blood:320] Out: 950 [Urine:950] Intake/Output this shift:    Gen: Alert, NAD CV: RR ABD: Soft, NT, ND EXT: right BKA stump incision looks excellent staples intact  Lab Results:   Recent Labs  06/05/15 0253  06/06/15 0514 06/06/15 0940 06/06/15 1723  WBC 7.0  --  8.0  --   --   HGB 7.2*  < > 8.9* 8.8* 8.8*  HCT 22.8*  < > 27.5* 27.2* 27.3*  PLT 337  --  382  --   --   < > = values in this interval not displayed. BMET  Recent Labs  06/04/15 0403 06/05/15 0253  NA 142  --   K 3.4*  --   CL 112*  --   CO2 24  --   GLUCOSE 128*  --   BUN 29*  --   CREATININE 1.02* 0.93  CALCIUM 8.1*  --    PT/INR No results for input(s): LABPROT, INR in the last 72 hours. ABG No results for input(s): PHART, HCO3 in the last 72 hours.  Invalid input(s): PCO2, PO2  Studies/Results: No results found.  Anti-infectives: Anti-infectives    Start     Dose/Rate Route Frequency Ordered Stop   05/29/15 1415  vancomycin (VANCOCIN) IVPB 1000 mg/200 mL premix     1,000 mg 200 mL/hr over 60 Minutes Intravenous  Once 05/29/15 1410 05/29/15 1515   05/29/15 1225  ceFAZolin (ANCEF) 2-3 GM-% IVPB SOLR    Comments:  Veda Canning: cabinet override      05/29/15 1225 05/29/15 1803   05/29/15 0208  ceFAZolin (ANCEF) IVPB 2 g/50 mL premix  Status:  Discontinued     2 g 100 mL/hr over 30 Minutes Intravenous On call to O.R. 05/29/15 0208 05/29/15 1411      Assessment/Plan: s/p Procedure(s): AMPUTATION  BELOW KNEE (Right)  Continue IM recomendations- much appreciated. Discharge planning on hold secondary to GI findings but if Hgb is stable tomorrow will move forward with DC   LOS: 8 days    Katha Cabal 06/06/2015

## 2015-06-06 NOTE — Telephone Encounter (Signed)
Called to inquire what the 3/22 appt is for, I told her it is to see Dr [facility] GYN oncologist.  She then told me that Ms Susalla had to have her leg amputated and is in rehab and not sure how she is going to to do. They may have to make her hospice again. It was decided to leave the appt for now until her condition stabilizes. She asked I let Dr Mike Gip know what is going on with her

## 2015-06-06 NOTE — Progress Notes (Signed)
Pt still having hard, clumps of stool with small to moderate amounts of blood in the diaper but no active bleeding seen. Rectum remains open and it looks like more stool is in there but there is also dark blood/clots seen in the rectum as well. Pt. Has stage 4 pressure ulcer on the sacrum and the area around the ulcer where the pink  foam dressing fits is red and shows breakdown of the skin.

## 2015-06-06 NOTE — Clinical Social Work Note (Signed)
Patient's discharge held due to continued rectal bleeding. Attending MD stated to nurse this morning that discharge will be looked at tomorrow. Shela Leff MSW,LCSW [phone]

## 2015-06-06 NOTE — Progress Notes (Signed)
Paged and spoke to Dr. Delana Meyer about discharge status and was updated and informed that patient is not ready for discharge today will monitor patient for one more day.

## 2015-06-07 LAB — CBC
HEMATOCRIT: 27.8 % — AB (ref 35.0–47.0)
Hemoglobin: 9 g/dL — ABNORMAL LOW (ref 12.0–16.0)
MCH: 26.2 pg (ref 26.0–34.0)
MCHC: 32.4 g/dL (ref 32.0–36.0)
MCV: 80.9 fL (ref 80.0–100.0)
Platelets: 426 10*3/uL (ref 150–440)
RBC: 3.44 MIL/uL — ABNORMAL LOW (ref 3.80–5.20)
RDW: 17.7 % — AB (ref 11.5–14.5)
WBC: 7.9 10*3/uL (ref 3.6–11.0)

## 2015-06-07 LAB — GLUCOSE, CAPILLARY
Glucose-Capillary: 55 mg/dL — ABNORMAL LOW (ref 65–99)
Glucose-Capillary: 81 mg/dL (ref 65–99)
Glucose-Capillary: 164 mg/dL — ABNORMAL HIGH (ref 65–99)

## 2015-06-07 NOTE — Progress Notes (Signed)
06/07/2015 1:12 PM  Called report to receiving RN at [facility].  Dola Argyle, RN

## 2015-06-07 NOTE — Progress Notes (Signed)
06/07/2015  07:40  NT Kerrie informed me that pt' CBG was 55.  Instructed to give pt orange juice and recheck.  Had some difficulty in arousing pt but she was able to drink 180cc orange juice.  Will recheck CBG to determine need for further interventions. Dola Argyle, RN

## 2015-06-07 NOTE — Care Management Important Message (Signed)
Important Message  Patient Details  Name: TAMAIA BENKE MRN: [id number] Date of Birth: [birth year]   Medicare Important Message Given:  Yes    Alvie Heidelberg, RN 06/07/2015, 10:19 AM

## 2015-06-07 NOTE — Progress Notes (Signed)
[place] at [place] NAME: Shelley Fields    MR#:  [id number]  DATE OF BIRTH:  [birth year]  SUBJECTIVE:  CHIEF COMPLAINT:  No chief complaint on file.  Patient is awake but unable to get any history. Patient's hemoglobin is stable. pt can be d/ced from GI standpoint  REVIEW OF SYSTEMS:   ROS unable to get  DRUG ALLERGIES:   Allergies  Allergen Reactions  . Contrast Media [Iodinated Diagnostic Agents]     Kidneys are operational at 28% and wont use dyes    VITALS:  Blood pressure 147/100, pulse 101, temperature 99.3 F (37.4 C), temperature source Oral, resp. rate 20, height 5' (1.524 m), weight 66.679 kg (147 lb), SpO2 100 %.  PHYSICAL EXAMINATION:  GENERAL:  79 y.o.-year-old patient lying in the bed with no acute distress. Pale LUNGS: Normal breath sounds bilaterally, no wheezing, rales,rhonchi or crepitation. No use of accessory muscles of respiration.  CARDIOVASCULAR: S1, S2 normal. No murmurs, rubs, or gallops.  ABDOMEN: Soft, nontender, nondistended. Bowel sounds present. No organomegaly or mass.  EXTREMITIES: No pedal edema, cyanosis, or clubbing. Right BKA dressed dry NEUROLOGIC: Patient is altered PSYCHIATRIC: The patient is oriented to person  SKIN: No obvious rash, lesion, or ulcer.    LABORATORY PANEL:   CBC  Recent Labs Lab 06/07/15 0507  WBC 7.9  HGB 9.0*  HCT 27.8*  PLT 426   ------------------------------------------------------------------------------------------------------------------  Chemistries   Recent Labs Lab 06/04/15 0403 06/05/15 0253  NA 142  --   K 3.4*  --   CL 112*  --   CO2 24  --   GLUCOSE 128*  --   BUN 29*  --   CREATININE 1.02* 0.93  CALCIUM 8.1*  --    ------------------------------------------------------------------------------------------------------------------  Cardiac Enzymes No results for input(s): TROPONINI in the last 168  hours. ------------------------------------------------------------------------------------------------------------------  RADIOLOGY:  No results found.  EKG:   Orders placed or performed during the hospital encounter of 05/15/15  . ED EKG  . ED EKG    ASSESSMENT AND PLAN:   1. 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 Diabetes mellitus -  A1c is 7.5, good control at home - Continue sliding scale and 18 units Levemir    3. Hypertension: Blood pressure better - Increased Nrvasc yesterday, added e beta blocker increased to 25  mg by mouth twice a day - Is also on Lasix at home which has been held  4. Chronic systolic heart failure - 2-D echocardiogram 12/7 shows preserved ejection fraction. Possible diastolic dysfunction. - No exacerbation at this time  5. Malnutrition - Encourage by mouth intake  6. Hypernatremia - Resolved  7. Decubitus ulcer of the back - Continue with wound care  8. Hypokalemia - Replace and monitor  9. Anemia 2/2 gi bleed from hemarrhoids on AOCD : HB /HCT Stable, anusol - ok to d/c pt from GI standpoing  Will sign off   All the records are reviewed and case discussed with Care Management/Social Workerr.  CODE STATUS: DO NOT RESUSCITATE  TOTAL TIME TAKING CARE OF THIS PATIENT: 84 MINUTES    Trystyn Dolley M.D on 06/07/2015 at 2:19 PM  Between 7am to 6pm - Pager - [phone]  After 6pm go to [email or web address] - password [email or web address] [facility]  Office  [phone]  CC: Primary care physician; [name], MD

## 2015-06-09 ENCOUNTER — Encounter: Payer: Self-pay | Admitting: Emergency Medicine

## 2015-06-09 ENCOUNTER — Inpatient Hospital Stay
Admission: EM | Admit: 2015-06-09 | Discharge: 2015-06-12 | DRG: 871 | Disposition: A | Discharge status: Hospice | Payer: Medicare Other | Attending: Internal Medicine | Admitting: Internal Medicine

## 2015-06-09 ENCOUNTER — Emergency Department: Payer: Medicare Other

## 2015-06-09 DIAGNOSIS — G9340 Encephalopathy, unspecified: Secondary | ICD-10-CM | POA: Diagnosis not present

## 2015-06-09 DIAGNOSIS — L89154 Pressure ulcer of sacral region, stage 4: Secondary | ICD-10-CM | POA: Diagnosis not present

## 2015-06-09 DIAGNOSIS — D72829 Elevated white blood cell count, unspecified: Secondary | ICD-10-CM

## 2015-06-09 DIAGNOSIS — Z8542 Personal history of malignant neoplasm of other parts of uterus: Secondary | ICD-10-CM | POA: Diagnosis not present

## 2015-06-09 DIAGNOSIS — Z4659 Encounter for fitting and adjustment of other gastrointestinal appliance and device: Secondary | ICD-10-CM

## 2015-06-09 DIAGNOSIS — A419 Sepsis, unspecified organism: Secondary | ICD-10-CM | POA: Diagnosis not present

## 2015-06-09 DIAGNOSIS — R1110 Vomiting, unspecified: Secondary | ICD-10-CM | POA: Diagnosis not present

## 2015-06-09 DIAGNOSIS — C7951 Secondary malignant neoplasm of bone: Secondary | ICD-10-CM | POA: Diagnosis not present

## 2015-06-09 DIAGNOSIS — Z8551 Personal history of malignant neoplasm of bladder: Secondary | ICD-10-CM

## 2015-06-09 DIAGNOSIS — C7800 Secondary malignant neoplasm of unspecified lung: Secondary | ICD-10-CM | POA: Diagnosis not present

## 2015-06-09 DIAGNOSIS — I252 Old myocardial infarction: Secondary | ICD-10-CM

## 2015-06-09 DIAGNOSIS — R4182 Altered mental status, unspecified: Secondary | ICD-10-CM

## 2015-06-09 DIAGNOSIS — K922 Gastrointestinal hemorrhage, unspecified: Secondary | ICD-10-CM | POA: Diagnosis present

## 2015-06-09 DIAGNOSIS — Z4682 Encounter for fitting and adjustment of non-vascular catheter: Secondary | ICD-10-CM | POA: Diagnosis not present

## 2015-06-09 DIAGNOSIS — Z515 Encounter for palliative care: Secondary | ICD-10-CM | POA: Diagnosis present

## 2015-06-09 DIAGNOSIS — Z8541 Personal history of malignant neoplasm of cervix uteri: Secondary | ICD-10-CM

## 2015-06-09 DIAGNOSIS — K219 Gastro-esophageal reflux disease without esophagitis: Secondary | ICD-10-CM | POA: Diagnosis present

## 2015-06-09 DIAGNOSIS — D62 Acute posthemorrhagic anemia: Secondary | ICD-10-CM | POA: Diagnosis present

## 2015-06-09 DIAGNOSIS — C541 Malignant neoplasm of endometrium: Secondary | ICD-10-CM | POA: Diagnosis present

## 2015-06-09 DIAGNOSIS — Z9049 Acquired absence of other specified parts of digestive tract: Secondary | ICD-10-CM

## 2015-06-09 DIAGNOSIS — Z91041 Radiographic dye allergy status: Secondary | ICD-10-CM | POA: Diagnosis not present

## 2015-06-09 DIAGNOSIS — R1111 Vomiting without nausea: Secondary | ICD-10-CM | POA: Diagnosis not present

## 2015-06-09 DIAGNOSIS — Z1621 Resistance to vancomycin: Secondary | ICD-10-CM | POA: Diagnosis present

## 2015-06-09 DIAGNOSIS — I5022 Chronic systolic (congestive) heart failure: Secondary | ICD-10-CM | POA: Diagnosis not present

## 2015-06-09 DIAGNOSIS — M1990 Unspecified osteoarthritis, unspecified site: Secondary | ICD-10-CM | POA: Diagnosis present

## 2015-06-09 DIAGNOSIS — I130 Hypertensive heart and chronic kidney disease with heart failure and stage 1 through stage 4 chronic kidney disease, or unspecified chronic kidney disease: Secondary | ICD-10-CM | POA: Diagnosis not present

## 2015-06-09 DIAGNOSIS — Z7401 Bed confinement status: Secondary | ICD-10-CM

## 2015-06-09 DIAGNOSIS — Z9079 Acquired absence of other genital organ(s): Secondary | ICD-10-CM

## 2015-06-09 DIAGNOSIS — Z794 Long term (current) use of insulin: Secondary | ICD-10-CM

## 2015-06-09 DIAGNOSIS — I2510 Atherosclerotic heart disease of native coronary artery without angina pectoris: Secondary | ICD-10-CM | POA: Diagnosis not present

## 2015-06-09 DIAGNOSIS — K5900 Constipation, unspecified: Secondary | ICD-10-CM | POA: Diagnosis present

## 2015-06-09 DIAGNOSIS — Z6829 Body mass index (BMI) 29.0-29.9, adult: Secondary | ICD-10-CM | POA: Diagnosis not present

## 2015-06-09 DIAGNOSIS — Z89511 Acquired absence of right leg below knee: Secondary | ICD-10-CM | POA: Diagnosis not present

## 2015-06-09 DIAGNOSIS — N189 Chronic kidney disease, unspecified: Secondary | ICD-10-CM | POA: Diagnosis not present

## 2015-06-09 DIAGNOSIS — G9341 Metabolic encephalopathy: Secondary | ICD-10-CM | POA: Diagnosis not present

## 2015-06-09 DIAGNOSIS — Z9221 Personal history of antineoplastic chemotherapy: Secondary | ICD-10-CM | POA: Diagnosis not present

## 2015-06-09 DIAGNOSIS — Z66 Do not resuscitate: Secondary | ICD-10-CM | POA: Diagnosis present

## 2015-06-09 DIAGNOSIS — R10817 Generalized abdominal tenderness: Secondary | ICD-10-CM | POA: Diagnosis not present

## 2015-06-09 DIAGNOSIS — D801 Nonfamilial hypogammaglobulinemia: Secondary | ICD-10-CM | POA: Diagnosis present

## 2015-06-09 DIAGNOSIS — I739 Peripheral vascular disease, unspecified: Secondary | ICD-10-CM | POA: Diagnosis present

## 2015-06-09 DIAGNOSIS — A4181 Sepsis due to Enterococcus: Principal | ICD-10-CM | POA: Diagnosis present

## 2015-06-09 DIAGNOSIS — E1122 Type 2 diabetes mellitus with diabetic chronic kidney disease: Secondary | ICD-10-CM | POA: Diagnosis not present

## 2015-06-09 DIAGNOSIS — L89150 Pressure ulcer of sacral region, unstageable: Secondary | ICD-10-CM | POA: Diagnosis not present

## 2015-06-09 DIAGNOSIS — Z90710 Acquired absence of both cervix and uterus: Secondary | ICD-10-CM

## 2015-06-09 DIAGNOSIS — K8020 Calculus of gallbladder without cholecystitis without obstruction: Secondary | ICD-10-CM | POA: Diagnosis not present

## 2015-06-09 DIAGNOSIS — I5032 Chronic diastolic (congestive) heart failure: Secondary | ICD-10-CM | POA: Diagnosis present

## 2015-06-09 DIAGNOSIS — E872 Acidosis, unspecified: Secondary | ICD-10-CM

## 2015-06-09 DIAGNOSIS — R000 Tachycardia, unspecified: Secondary | ICD-10-CM | POA: Diagnosis present

## 2015-06-09 DIAGNOSIS — L89301 Pressure ulcer of unspecified buttock, stage 1: Secondary | ICD-10-CM | POA: Diagnosis present

## 2015-06-09 DIAGNOSIS — R112 Nausea with vomiting, unspecified: Secondary | ICD-10-CM | POA: Diagnosis not present

## 2015-06-09 DIAGNOSIS — R911 Solitary pulmonary nodule: Secondary | ICD-10-CM | POA: Diagnosis present

## 2015-06-09 DIAGNOSIS — D638 Anemia in other chronic diseases classified elsewhere: Secondary | ICD-10-CM | POA: Diagnosis present

## 2015-06-09 DIAGNOSIS — E7800 Pure hypercholesterolemia, unspecified: Secondary | ICD-10-CM | POA: Diagnosis present

## 2015-06-09 DIAGNOSIS — F0390 Unspecified dementia without behavioral disturbance: Secondary | ICD-10-CM | POA: Diagnosis present

## 2015-06-09 DIAGNOSIS — Z90722 Acquired absence of ovaries, bilateral: Secondary | ICD-10-CM | POA: Diagnosis not present

## 2015-06-09 DIAGNOSIS — M6281 Muscle weakness (generalized): Secondary | ICD-10-CM | POA: Diagnosis not present

## 2015-06-09 DIAGNOSIS — R109 Unspecified abdominal pain: Secondary | ICD-10-CM

## 2015-06-09 DIAGNOSIS — L0291 Cutaneous abscess, unspecified: Secondary | ICD-10-CM | POA: Diagnosis not present

## 2015-06-09 DIAGNOSIS — R6521 Severe sepsis with septic shock: Secondary | ICD-10-CM | POA: Diagnosis present

## 2015-06-09 DIAGNOSIS — K625 Hemorrhage of anus and rectum: Secondary | ICD-10-CM | POA: Diagnosis not present

## 2015-06-09 DIAGNOSIS — R7881 Bacteremia: Secondary | ICD-10-CM | POA: Diagnosis not present

## 2015-06-09 DIAGNOSIS — L89159 Pressure ulcer of sacral region, unspecified stage: Secondary | ICD-10-CM | POA: Diagnosis present

## 2015-06-09 DIAGNOSIS — K920 Hematemesis: Secondary | ICD-10-CM | POA: Diagnosis not present

## 2015-06-09 HISTORY — DX: Type 2 diabetes mellitus without complications: E11.9

## 2015-06-09 LAB — CBC WITH DIFFERENTIAL/PLATELET
BASOS ABS: 0 10*3/uL (ref 0–0.1)
Basophils Relative: 0 %
EOS ABS: 0 10*3/uL (ref 0–0.7)
EOS PCT: 0 %
HCT: 32.3 % — ABNORMAL LOW (ref 35.0–47.0)
Hemoglobin: 10.3 g/dL — ABNORMAL LOW (ref 12.0–16.0)
LYMPHS PCT: 4 %
Lymphs Abs: 1 10*3/uL (ref 1.0–3.6)
MCH: 26 pg (ref 26.0–34.0)
MCHC: 31.8 g/dL — ABNORMAL LOW (ref 32.0–36.0)
MCV: 81.9 fL (ref 80.0–100.0)
MONO ABS: 0.5 10*3/uL (ref 0.2–0.9)
Monocytes Relative: 2 %
Neutro Abs: 21.9 10*3/uL — ABNORMAL HIGH (ref 1.4–6.5)
Neutrophils Relative %: 94 %
PLATELETS: 583 10*3/uL — AB (ref 150–440)
RBC: 3.95 MIL/uL (ref 3.80–5.20)
RDW: 17.5 % — AB (ref 11.5–14.5)
WBC: 23.3 10*3/uL — AB (ref 3.6–11.0)

## 2015-06-09 LAB — URINALYSIS COMPLETE WITH MICROSCOPIC (ARMC ONLY)
BILIRUBIN URINE: NEGATIVE
Glucose, UA: 150 mg/dL — AB
NITRITE: NEGATIVE
PH: 5 (ref 5.0–8.0)
Protein, ur: >500 mg/dL — AB
SPECIFIC GRAVITY, URINE: 1.019 (ref 1.005–1.030)
SQUAMOUS EPITHELIAL / LPF: NONE SEEN

## 2015-06-09 LAB — COMPREHENSIVE METABOLIC PANEL
ALT: 126 U/L — ABNORMAL HIGH (ref 14–54)
AST: 308 U/L — ABNORMAL HIGH (ref 15–41)
Albumin: 2.4 g/dL — ABNORMAL LOW (ref 3.5–5.0)
Alkaline Phosphatase: 47 U/L (ref 38–126)
Anion gap: 15 (ref 5–15)
BILIRUBIN TOTAL: 0.8 mg/dL (ref 0.3–1.2)
BUN: 27 mg/dL — AB (ref 6–20)
CO2: 20 mmol/L — ABNORMAL LOW (ref 22–32)
CREATININE: 1.1 mg/dL — AB (ref 0.44–1.00)
Calcium: 8.8 mg/dL — ABNORMAL LOW (ref 8.9–10.3)
Chloride: 109 mmol/L (ref 101–111)
GFR calc Af Amer: 55 mL/min — ABNORMAL LOW (ref >60)
GFR, EST NON AFRICAN AMERICAN: 47 mL/min — AB (ref >60)
GLUCOSE: 265 mg/dL — AB (ref 65–99)
POTASSIUM: 4.5 mmol/L (ref 3.5–5.1)
SODIUM: 144 mmol/L (ref 135–145)
TOTAL PROTEIN: 7.3 g/dL (ref 6.5–8.1)

## 2015-06-09 LAB — LACTIC ACID, PLASMA: LACTIC ACID, VENOUS: 3.3 mmol/L — AB (ref 0.5–2.0)

## 2015-06-09 MED ORDER — ONDANSETRON HCL 4 MG PO TABS: 4.0000 mg | ORAL_TABLET | Freq: Four times a day (QID) | ORAL | Status: DC | PRN

## 2015-06-09 MED ORDER — SODIUM CHLORIDE 0.9 % IV BOLUS (SEPSIS)
1000.0000 mL | Freq: Once | INTRAVENOUS | Status: AC
  Administered 2015-06-10: 01:00:00 1000 mL via INTRAVENOUS

## 2015-06-09 MED ORDER — MORPHINE SULFATE (PF) 2 MG/ML IV SOLN
2.0000 mg | INTRAVENOUS | Status: DC | PRN
  Administered 2015-06-10 – 2015-06-11 (×2): 2 mg via INTRAVENOUS
  Filled 2015-06-09 (×2): qty 1

## 2015-06-09 MED ORDER — ONDANSETRON HCL 4 MG/2ML IJ SOLN
4.0000 mg | Freq: Once | INTRAMUSCULAR | Status: AC
  Administered 2015-06-09: 4 mg via INTRAVENOUS

## 2015-06-09 MED ORDER — SODIUM CHLORIDE 0.9 % IV BOLUS (SEPSIS)
1000.0000 mL | Freq: Once | INTRAVENOUS | Status: AC
  Administered 2015-06-09: 1000 mL via INTRAVENOUS

## 2015-06-09 MED ORDER — INSULIN ASPART 100 UNIT/ML ~~LOC~~ SOLN
0.0000 [IU] | Freq: Three times a day (TID) | SUBCUTANEOUS | Status: DC
  Administered 2015-06-10: 07:00:00 8 [IU] via SUBCUTANEOUS
  Filled 2015-06-09: qty 8
  Filled 2015-06-09: qty 5

## 2015-06-09 MED ORDER — ACETAMINOPHEN 650 MG RE SUPP: 650.0000 mg | Freq: Four times a day (QID) | RECTAL | Status: DC | PRN

## 2015-06-09 MED ORDER — VANCOMYCIN HCL IN DEXTROSE 1-5 GM/200ML-% IV SOLN
1000.0000 mg | Freq: Once | INTRAVENOUS | Status: AC
  Administered 2015-06-09: 1000 mg via INTRAVENOUS
  Filled 2015-06-09: qty 200

## 2015-06-09 MED ORDER — ASPIRIN EC 81 MG PO TBEC: 81.0000 mg | DELAYED_RELEASE_TABLET | Freq: Every day | ORAL | Status: DC

## 2015-06-09 MED ORDER — INSULIN ASPART 100 UNIT/ML ~~LOC~~ SOLN
0.0000 [IU] | Freq: Every day | SUBCUTANEOUS | Status: DC
  Administered 2015-06-10: 02:00:00 4 [IU] via SUBCUTANEOUS
  Filled 2015-06-09: qty 4

## 2015-06-09 MED ORDER — ACETAMINOPHEN 325 MG PO TABS: 650.0000 mg | ORAL_TABLET | Freq: Four times a day (QID) | ORAL | Status: DC | PRN

## 2015-06-09 MED ORDER — SODIUM CHLORIDE 0.45 % IV SOLN
INTRAVENOUS | Status: DC
  Administered 2015-06-10: 01:00:00 via INTRAVENOUS

## 2015-06-09 MED ORDER — DOCUSATE SODIUM 100 MG PO CAPS: 100.0000 mg | ORAL_CAPSULE | Freq: Two times a day (BID) | ORAL | Status: DC

## 2015-06-09 MED ORDER — ONDANSETRON HCL 4 MG/2ML IJ SOLN
4.0000 mg | Freq: Four times a day (QID) | INTRAMUSCULAR | Status: DC | PRN
  Filled 2015-06-09: qty 2

## 2015-06-09 MED ORDER — FLEET ENEMA 7-19 GM/118ML RE ENEM: 1.0000 | ENEMA | Freq: Once | RECTAL | Status: DC | PRN

## 2015-06-09 MED ORDER — POLYETHYLENE GLYCOL 3350 17 G PO PACK: 17.0000 g | PACK | Freq: Every day | ORAL | Status: DC | PRN

## 2015-06-09 MED ORDER — ALBUTEROL SULFATE (2.5 MG/3ML) 0.083% IN NEBU: 2.5000 mg | INHALATION_SOLUTION | RESPIRATORY_TRACT | Status: DC | PRN

## 2015-06-09 MED ORDER — ENOXAPARIN SODIUM 40 MG/0.4ML ~~LOC~~ SOLN: 40.0000 mg | SUBCUTANEOUS | Status: DC

## 2015-06-09 MED ORDER — OXYCODONE HCL 5 MG PO TABS: 5.0000 mg | ORAL_TABLET | ORAL | Status: DC | PRN

## 2015-06-09 MED ORDER — BISACODYL 5 MG PO TBEC: 5.0000 mg | DELAYED_RELEASE_TABLET | Freq: Every day | ORAL | Status: DC | PRN

## 2015-06-09 MED ORDER — PIPERACILLIN-TAZOBACTAM 3.375 G IVPB
3.3750 g | Freq: Once | INTRAVENOUS | Status: AC
  Administered 2015-06-09: 3.375 g via INTRAVENOUS
  Filled 2015-06-09: qty 50

## 2015-06-09 NOTE — ED Provider Notes (Signed)
[facility] Emergency Department Provider Note    ____________________________________________  Time seen: 2210  I have reviewed the triage vital signs and the nursing notes.   HISTORY  Chief Complaint Altered Mental Status   History limited by: Altered Mental Status   HPI Shelley Fields is a 80 y.o. female who presents to the emergency department today because of altered mental status. She is unable to give any history. The patient was discharged from the hospital 2 days ago after an admission for concern for infection at the site of the knee amputation. Per EMS report the patient had been doing okay at time of discharge the nursing staff found her tonight more altered. The patient was tachycardic on transport.  Past Medical History  Diagnosis Date  . Diabetes mellitus without complication ([place])   . C. difficile diarrhea   . Uterine cancer ([place])   . Hypogammaglobulinemia ([place])   . Pulmonary embolism ([place])   . Decubitus ulcers   . Hypertension   . Hypercholesterolemia   . Incontinence of urine   . Cancer [facility])     Endometrial Cancer  . Cervical cancer ([place])     with radiation 45 years ago...  . Arthritis   . GERD (gastroesophageal reflux disease)   . Bladder cancer ([place])   . Decubitus ulcer of buttock, stage 1   . Peripheral vascular disease ([place])   . Anemia   . Coronary artery disease   . CHF (congestive heart failure) ([place])   . Shortness of breath dyspnea   . Hypoxia   . Gangrene of foot ([place])     right  . Myocardial infarction [facility])     nstemi 12/16  . Chronic kidney disease     acute on chronic renal failure 12/16  . Dementia     Patient Active Problem List   Diagnosis Date Noted  . Malnutrition of moderate degree 05/31/2015  . Atherosclerosis of aorta with gangrene ([place]) 05/29/2015  . NSTEMI (non-ST elevated myocardial infarction) ([place]) 05/15/2015  . Chronic systolic CHF (congestive heart failure) ([place]) 05/15/2015  . Chronic  anticoagulation 04/21/2015  . B12 deficiency 03/18/2015  . Anemia 02/17/2015  . Weight loss 02/17/2015  . Type 2 diabetes mellitus with hyperglycemia ([place]) 12/12/2014  . Decubitus ulcer of back, stage 4 ([place]) 12/12/2014  . HTN (hypertension) 12/12/2014  . Anorexia 11/23/2014  . Encounter for other specified administrative purpose 07/10/2013  . [place] vulvovaginitis 10/14/2012  . Long term current use of opiate analgesic 06/23/2012  . Breast lump 06/10/2012  . Diagnostic skin and sensitization tests 04/12/2012  . Bladder infection, chronic 03/23/2012  . Total urinary incontinence 03/23/2012  . 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 Female genuine stress incontinence 03/23/2012  . Disorder of bladder function 03/23/2012  . Incomplete bladder emptying 03/23/2012  . 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 Cystitis, radiation 03/23/2012  . Cancer of body of uterus ([place]) 03/23/2012  . Cancer of corpus uteri, except isthmus ([place]) 03/23/2012  . Bladder neoplasm of uncertain malignant potential 03/23/2012  . Colon polyp 01/06/2012  . Difficulty in walking 01/06/2012  . Deep vein thrombosis ([name]) 01/06/2012  . Cancer of endometrium ([place]) 01/06/2012  . Contact with and exposure to tuberculosis 01/06/2012  . Angiopathy, peripheral ([place]) 01/06/2012  . Abnormal presence of protein in urine 01/06/2012  . Retinopathy 01/06/2012    Past Surgical History  Procedure Laterality Date  . Total abdominal hysterectomy w/ bilateral salpingoophorectomy  07/29/2010  . Stent in leg  2016  .  Cataract extraction, bilateral    . Bladder surgery    . Femoral endarterectomy      right  . Leg surgery      right leg  . Portacath placement      right  . Esophagogastroduodenoscopy N/A 12/14/2014    Procedure: ESOPHAGOGASTRODUODENOSCOPY (EGD);  Surgeon: Hulen Luster, MD;  Location: [facility] ENDOSCOPY;  Service: Endoscopy;  Laterality: N/A;  . Peripheral vascular catheterization Right 04/05/2015    Procedure:  Lower Extremity Angiography;  Surgeon: Katha Cabal, MD;  Location: [place] CV LAB;  Service: Cardiovascular;  Laterality: Right;  . Peripheral vascular catheterization  04/05/2015    Procedure: Lower Extremity Intervention;  Surgeon: Katha Cabal, MD;  Location: [place] CV LAB;  Service: Cardiovascular;;  . Peripheral vascular catheterization Right 05/14/2015    Procedure: Lower Extremity Angiography;  Surgeon: Katha Cabal, MD;  Location: [place] CV LAB;  Service: Cardiovascular;  Laterality: Right;  . Peripheral vascular catheterization Right 05/14/2015    Procedure: Lower Extremity Intervention;  Surgeon: Katha Cabal, MD;  Location: [place] CV LAB;  Service: Cardiovascular;  Laterality: Right;  . Angioplasty    . Amputation Right 05/29/2015    Procedure: AMPUTATION BELOW KNEE;  Surgeon: Katha Cabal, MD;  Location: [facility] ORS;  Service: Vascular;  Laterality: Right;    Current Outpatient Rx  Name  Route  Sig  Dispense  Refill  . ALPRAZolam (XANAX) 0.5 MG tablet   Oral   Take 1 tablet (0.5 mg total) by mouth 2 (two) times daily as needed for anxiety.   30 tablet   0   . amLODipine (NORVASC) 5 MG tablet   Oral   Take 5 mg by mouth daily.         Marland Kitchen aspirin 81 MG tablet   Oral   Take 81 mg by mouth daily.         Marland Kitchen atorvastatin (LIPITOR) 40 MG tablet   Oral   Take 1 tablet (40 mg total) by mouth daily at 6 PM.   30 tablet   0   . docusate sodium (COLACE) 100 MG capsule   Oral   Take 1 capsule (100 mg total) by mouth 2 (two) times daily.   10 capsule   0   . furosemide (LASIX) 20 MG tablet   Oral   Take 1 tablet (20 mg total) by mouth daily.   30 tablet   0   . HYDROcodone-acetaminophen (NORCO/VICODIN) 5-325 MG tablet   Oral   Take 1-2 tablets by mouth every 4 (four) hours as needed for moderate pain.   30 tablet   0   . insulin detemir (LEVEMIR) 100 UNIT/ML injection   Subcutaneous   Inject 0.15 mLs (15 Units  total) into the skin at bedtime.   10 mL   11   . ketoconazole (NIZORAL) 2 % cream   Topical   Apply 1 application topically 2 (two) times daily as needed (yeast).          . magnesium oxide (MAG-OX) 400 MG tablet   Oral   Take 400 mg by mouth daily.         . megestrol (MEGACE ES) 625 MG/5ML suspension   Oral   Take 1.6 mLs (200 mg total) by mouth daily. to stimulate appetite   150 mL   0   . metoprolol tartrate (LOPRESSOR) 25 MG tablet   Oral   Take 1 tablet (25 mg total) by  mouth 2 (two) times daily.   60 tablet   0   . mirtazapine (REMERON) 15 MG tablet   Oral   Take 15 mg by mouth at bedtime.          Marland Kitchen oxyCODONE (ROXICODONE) 15 MG immediate release tablet   Oral   Take 1 tablet (15 mg total) by mouth every 4 (four) hours as needed for pain.   30 tablet   0   . promethazine (PHENERGAN) 25 MG tablet   Oral   Take 25 mg by mouth every 8 (eight) hours as needed for nausea or vomiting.         . risperiDONE (RISPERDAL) 0.25 MG tablet   Oral   Take 1 tablet (0.25 mg total) by mouth at bedtime.   10 tablet   0   . sertraline (ZOLOFT) 25 MG tablet   Oral   Take 1 tablet (25 mg total) by mouth daily.   30 tablet   0   . tamsulosin (FLOMAX) 0.4 MG CAPS capsule   Oral   Take 0.4 mg by mouth 2 (two) times daily.           Allergies Contrast media  Family History  Problem Relation Age of Onset  . Hypertension Mother   . Diabetes Mellitus II Mother   . Coronary artery disease Mother   . Hypertension Father   . Diabetes Mellitus II Father   . CAD Father   . Diabetes Mellitus II Sister   . Hypertension Sister   . Diabetes Mellitus II Brother   . Hypertension Brother   . Cancer Brother     Social History Social History  Substance Use Topics  . Smoking status: Never Smoker   . Smokeless tobacco: None  . Alcohol Use: No    Review of Systems Unable to obtain secondary to altered mental  status ____________________________________________   PHYSICAL EXAM:  VITAL SIGNS: ED Triage Vitals  Enc Vitals Group     BP 06/09/15 2158 189/74 mmHg     Pulse Rate 06/09/15 2158 105     Resp --      Temp --      Temp src --      SpO2 06/09/15 2154 96 %   Constitutional: Awake, somnolent, attempts to follow simple commands. Moaning. Eyes: Conjunctivae are normal. PERRL. Normal extraocular movements. ENT   Head: Normocephalic and atraumatic.   Nose: No congestion/rhinnorhea.   Mouth/Throat: Mucous membranes are moist.   Neck: No stridor. Hematological/Lymphatic/Immunilogical: No cervical lymphadenopathy. Cardiovascular: Tachycardic, regular rhythm.  No murmurs, rubs, or gallops. Respiratory: Normal respiratory effort without tachypnea nor retractions. Breath sounds are clear and equal bilaterally. No wheezes/rales/rhonchi. Gastrointestinal: Soft and nontender. No distention.  Genitourinary: Deferred Musculoskeletal: Status post right BKA. That incision site without any significant surrounding erythema.  Neurologic:  Moaning, appears to be moving all extremities.  Skin:  Patient with a concerning sacral ulcer. malodorous.  ____________________________________________    LABS (pertinent positives/negatives)  Labs Reviewed  COMPREHENSIVE METABOLIC PANEL - Abnormal; Notable for the following:    CO2 20 (*)    Glucose, Bld 265 (*)    BUN 27 (*)    Creatinine, Ser 1.10 (*)    Calcium 8.8 (*)    Albumin 2.4 (*)    AST 308 (*)    ALT 126 (*)    GFR calc non Af Amer 47 (*)    GFR calc Af Amer 55 (*)    All other components within  normal limits  CBC WITH DIFFERENTIAL/PLATELET - Abnormal; Notable for the following:    WBC 23.3 (*)    Hemoglobin 10.3 (*)    HCT 32.3 (*)    MCHC 31.8 (*)    RDW 17.5 (*)    Platelets 583 (*)    Neutro Abs 21.9 (*)    All other components within normal limits  LACTIC ACID, PLASMA - Abnormal; Notable for the following:     Lactic Acid, Venous 3.3 (*)    All other components within normal limits  URINALYSIS COMPLETEWITH MICROSCOPIC ([facility] ONLY) - Abnormal; Notable for the following:    Color, Urine YELLOW (*)    APPearance CLEAR (*)    Glucose, UA 150 (*)    Ketones, ur 1+ (*)    Hgb urine dipstick 2+ (*)    Protein, ur >500 (*)    Leukocytes, UA TRACE (*)    Bacteria, UA RARE (*)    All other components within normal limits  CULTURE, BLOOD (ROUTINE X 2)  CULTURE, BLOOD (ROUTINE X 2)  URINE CULTURE  LACTIC ACID, PLASMA  CBC WITH DIFFERENTIAL/PLATELET  BASIC METABOLIC PANEL     ____________________________________________   EKG  I, Nance Pear, attending physician, personally viewed and interpreted this EKG  EKG Time: 2158 Rate: 111 Rhythm: sinus tachycardia Axis: normal Intervals: qtc 407 QRS: narrow, q waves V1 ST changes: no st elevation Impression: abnormal ekg  ____________________________________________    RADIOLOGY  CXR IMPRESSION: 1.9 cm nodule again noted at the right midlung zone, concerning for metastatic disease given the patient's history. Lungs otherwise grossly clear.   ____________________________________________   PROCEDURES  Procedure(s) performed: None  Critical Care performed: Yes, see critical care note(s)  CRITICAL CARE Performed by: Nance Pear   Total critical care time: 30 minutes  Critical care time was exclusive of separately billable procedures and treating other patients.  Critical care was necessary to treat or prevent imminent or life-threatening deterioration.  Critical care was time spent personally by me on the following activities: development of treatment plan with patient and/or surrogate as well as nursing, discussions with consultants, evaluation of patient's response to treatment, examination of patient, obtaining history from patient or surrogate, ordering and performing treatments and interventions, ordering and review  of laboratory studies, ordering and review of radiographic studies, pulse oximetry and re-evaluation of patient's condition.  ____________________________________________   INITIAL IMPRESSION / ASSESSMENT AND PLAN / ED COURSE  Pertinent labs & imaging results that were available during my care of the patient were reviewed by me and considered in my medical decision making (see chart for details).  Patient presented to the emergency department today from living facility because of concerns for altered mental status. The patient was tachycardic upon initial arrival. Additionally given the altered mental status and tachycardia did have concerns for infection. Physical exam was quite concerning for sacral ulcer which I feel is likely infected. The blood work is consistent with sepsis. Lactic acidosis and leukocytosis. The patient was given multiple broad-spectrum antibiotics. The patient was given IV fluids. The patient will be admitted to the hospitalist service.  ____________________________________________   FINAL CLINICAL IMPRESSION(S) / ED DIAGNOSES  Final diagnoses:  Sepsis, due to unspecified organism ([place])  Lactic acidosis  Leukocytosis     Nance Pear, MD 06/09/15 2340

## 2015-06-09 NOTE — H&P (Signed)
[place] [facility] [place] NAME: Shelley Fields    MR#:  [id number]  DATE OF BIRTH:  01-08-1936  DATE OF ADMISSION:  06/09/2015  PRIMARY CARE PHYSICIAN: [name], MD   REQUESTING/REFERRING PHYSICIAN: Dr. Archie Balboa  CHIEF COMPLAINT:   Chief Complaint  Patient presents with  . Altered Mental Status    HISTORY OF PRESENT ILLNESS:  Shelley Fields  is a 80 y.o. female with a known history of HTN, DM, Dementia, recent R BKA here from NH due to worsening mental status. Normal chatty and now poorly communicative. Found to have elevated WBC 23k. Poor PO intake.  PAST MEDICAL HISTORY:   Past Medical History  Diagnosis Date  . Diabetes mellitus without complication ([place])   . C. difficile diarrhea   . Uterine cancer ([place])   . Hypogammaglobulinemia ([place])   . Pulmonary embolism ([place])   . Decubitus ulcers   . Hypertension   . Hypercholesterolemia   . Incontinence of urine   . Cancer [facility])     Endometrial Cancer  . Cervical cancer ([place])     with radiation 45 years ago...  . Arthritis   . GERD (gastroesophageal reflux disease)   . Bladder cancer ([place])   . Decubitus ulcer of buttock, stage 1   . Peripheral vascular disease ([place])   . Anemia   . Coronary artery disease   . CHF (congestive heart failure) ([place])   . Shortness of breath dyspnea   . Hypoxia   . Gangrene of foot ([place])     right  . Myocardial infarction [facility])     nstemi 12/16  . Chronic kidney disease     acute on chronic renal failure 12/16  . Dementia   . Diabetes ([place])     PAST SURGICAL HISTORY:   Past Surgical History  Procedure Laterality Date  . Total abdominal hysterectomy w/ bilateral salpingoophorectomy  07/29/2010  . Stent in leg  2016  . Cataract extraction, bilateral    . Bladder surgery    . Femoral endarterectomy      right  . Leg surgery      right leg  . Portacath placement      right  . Esophagogastroduodenoscopy N/A 12/14/2014    Procedure:  ESOPHAGOGASTRODUODENOSCOPY (EGD);  Surgeon: Hulen Luster, MD;  Location: [facility] ENDOSCOPY;  Service: Endoscopy;  Laterality: N/A;  . Peripheral vascular catheterization Right 04/05/2015    Procedure: Lower Extremity Angiography;  Surgeon: Katha Cabal, MD;  Location: [place] CV LAB;  Service: Cardiovascular;  Laterality: Right;  . Peripheral vascular catheterization  04/05/2015    Procedure: Lower Extremity Intervention;  Surgeon: Katha Cabal, MD;  Location: [place] CV LAB;  Service: Cardiovascular;;  . Peripheral vascular catheterization Right 05/14/2015    Procedure: Lower Extremity Angiography;  Surgeon: Katha Cabal, MD;  Location: [place] CV LAB;  Service: Cardiovascular;  Laterality: Right;  . Peripheral vascular catheterization Right 05/14/2015    Procedure: Lower Extremity Intervention;  Surgeon: Katha Cabal, MD;  Location: [place] CV LAB;  Service: Cardiovascular;  Laterality: Right;  . Angioplasty    . Amputation Right 05/29/2015    Procedure: AMPUTATION BELOW KNEE;  Surgeon: Katha Cabal, MD;  Location: [facility] ORS;  Service: Vascular;  Laterality: Right;    SOCIAL HISTORY:   Social History  Substance Use Topics  . Smoking status: Never Smoker   . Smokeless tobacco: Not on file  . Alcohol Use:  No    FAMILY HISTORY:   Family History  Problem Relation Age of Onset  . Hypertension Mother   . Diabetes Mellitus II Mother   . Coronary artery disease Mother   . Hypertension Father   . Diabetes Mellitus II Father   . CAD Father   . Diabetes Mellitus II Sister   . Hypertension Sister   . Diabetes Mellitus II Brother   . Hypertension Brother   . Cancer Brother     DRUG ALLERGIES:   Allergies  Allergen Reactions  . Contrast Media [Iodinated Diagnostic Agents]     Kidneys are operational at 28% and wont use dyes    REVIEW OF SYSTEMS:   Review of Systems  Unable to perform ROS: dementia  Eyes: Positive for double vision.     MEDICATIONS AT HOME:   Prior to Admission medications   Medication Sig Start Date End Date Taking? Authorizing Provider  ALPRAZolam Duanne Moron) 0.5 MG tablet Take 1 tablet (0.5 mg total) by mouth 2 (two) times daily as needed for anxiety. 06/05/15  Yes Katha Cabal, MD  amLODipine (NORVASC) 5 MG tablet Take 5 mg by mouth daily.   Yes Historical Provider, MD  aspirin 81 MG tablet Take 81 mg by mouth daily.   Yes Historical Provider, MD  atorvastatin (LIPITOR) 40 MG tablet Take 1 tablet (40 mg total) by mouth daily at 6 PM. 05/24/15  Yes Vaughan Basta, MD  docusate sodium (COLACE) 100 MG capsule Take 1 capsule (100 mg total) by mouth 2 (two) times daily. 05/24/15  Yes Vaughan Basta, MD  furosemide (LASIX) 20 MG tablet Take 1 tablet (20 mg total) by mouth daily. 05/24/15  Yes Vaughan Basta, MD  HYDROcodone-acetaminophen (NORCO/VICODIN) 5-325 MG tablet Take 1-2 tablets by mouth every 4 (four) hours as needed for moderate pain. 06/05/15  Yes Katha Cabal, MD  insulin detemir (LEVEMIR) 100 UNIT/ML injection Inject 0.15 mLs (15 Units total) into the skin at bedtime. 05/24/15  Yes Vaughan Basta, MD  ketoconazole (NIZORAL) 2 % cream Apply 1 application topically 2 (two) times daily as needed (yeast).    Yes Historical Provider, MD  magnesium oxide (MAG-OX) 400 MG tablet Take 400 mg by mouth daily.   Yes Historical Provider, MD  megestrol (MEGACE ES) 625 MG/5ML suspension Take 1.6 mLs (200 mg total) by mouth daily. to stimulate appetite 11/23/14  Yes Lequita Asal, MD  metoprolol tartrate (LOPRESSOR) 25 MG tablet Take 1 tablet (25 mg total) by mouth 2 (two) times daily. 06/05/15  Yes Nicholes Mango, MD  mirtazapine (REMERON) 15 MG tablet Take 15 mg by mouth at bedtime.  10/24/14  Yes Historical Provider, MD  oxyCODONE (ROXICODONE) 15 MG immediate release tablet Take 1 tablet (15 mg total) by mouth every 4 (four) hours as needed for pain. 06/05/15  Yes Katha Cabal, MD  promethazine (PHENERGAN) 25 MG tablet Take 25 mg by mouth every 8 (eight) hours as needed for nausea or vomiting.   Yes Historical Provider, MD  risperiDONE (RISPERDAL) 0.25 MG tablet Take 1 tablet (0.25 mg total) by mouth at bedtime. 05/24/15  Yes Vaughan Basta, MD  sertraline (ZOLOFT) 25 MG tablet Take 1 tablet (25 mg total) by mouth daily. 05/24/15  Yes Vaughan Basta, MD  tamsulosin (FLOMAX) 0.4 MG CAPS capsule Take 0.4 mg by mouth 2 (two) times daily. 10/24/14  Yes Historical Provider, MD      VITAL SIGNS:  Blood pressure 189/74, pulse 105, SpO2 99 %.  PHYSICAL  EXAMINATION:  Physical Exam  GENERAL:  80 y.o.-year-old patient lying in the bed with no acute distress.  EYES: Pupils equal, round, reactive to light and accommodation. No scleral icterus. Extraocular muscles intact.  HEENT: Head atraumatic, normocephalic. Oropharynx and nasopharynx clear. No oropharyngeal erythema, moist oral mucosa  NECK:  Supple, no jugular venous distention. No thyroid enlargement, no tenderness.  LUNGS: Normal breath sounds bilaterally, no wheezing, rales, rhonchi. No use of accessory muscles of respiration.  CARDIOVASCULAR: S1, S2 normal. No murmurs, rubs, or gallops.  ABDOMEN: Soft, nontender, nondistended. Bowel sounds present. No organomegaly or mass.  EXTREMITIES: No pedal edema, cyanosis, or clubbing. R BKA NEUROLOGIC: Cranial nerves II through XII are intact. No focal Motor or sensory deficits appreciated b/l PSYCHIATRIC: The patient is [place] SKIN: R BKA staples in place. No discharge or erythema. Large un stageable sacral decubitus ulcer, foul smelling.  LABORATORY PANEL:   CBC  Recent Labs Lab 06/09/15 2207  WBC 23.3*  HGB 10.3*  HCT 32.3*  PLT 583*   ------------------------------------------------------------------------------------------------------------------  Chemistries   Recent Labs Lab 06/09/15 2207  NA 144  K 4.5  CL 109  CO2 20*   GLUCOSE 265*  BUN 27*  CREATININE 1.10*  CALCIUM 8.8*  AST 308*  ALT 126*  ALKPHOS 47  BILITOT 0.8   ------------------------------------------------------------------------------------------------------------------  Cardiac Enzymes No results for input(s): TROPONINI in the last 168 hours. ------------------------------------------------------------------------------------------------------------------  RADIOLOGY:  Dg Chest Port 1 View  06/09/2015  CLINICAL DATA:  Acute onset of altered mental status. Initial encounter. EXAM: PORTABLE CHEST 1 VIEW COMPARISON:  Chest radiograph performed 05/16/2015 FINDINGS: The lungs are well-aerated. A 1.9 cm nodule is again noted at the right midlung zone, concerning for metastatic disease given the patient's history. There is no evidence of pleural effusion or pneumothorax. The cardiomediastinal silhouette is borderline normal in size. No acute osseous abnormalities are seen. A right-sided chest port is noted ending about the distal SVC. IMPRESSION: 1.9 cm nodule again noted at the right midlung zone, concerning for metastatic disease given the patient's history. Lungs otherwise grossly clear. Electronically Signed   By: Garald Balding M.D.   On: 06/09/2015 22:57     IMPRESSION AND PLAN:   * Sepsis due to sacral decubitus ulcer infection Start IV vanc and zosyn Blood cx Consult surgery for debridement IVF blous with elevated lactic acid  * Acute encephalopathy Due to above  * Recent R BKA Seems to be healing well  * IDDM Levemir and SSI. ADA  * HTN Continue home meds  * Dementia Watch for inpatient delirium  * Vomiting Check Abd xray Zofran PRN  * Anemia of chronic disease Stable. She did have some blood in stool recently but seems to have resolved.  * DVT prophylaxis Lovenox   All the records are reviewed and case discussed with ED provider. Management plans discussed with the patient, family and they are in  agreement.  CODE STATUS: DNR  TOTAL TIME TAKING CARE OF THIS PATIENT: 45 minutes.    Hillary Bow R M.D on 06/09/2015 at 11:28 PM  Between 7am to 6pm - Pager - [phone]  After 6pm go to [email or web address] - password [email or web address] [facility]  Office  [phone]  CC: Primary care physician; [name], MD   Note: This dictation was prepared with Dragon dictation along with smaller phrase technology. Any transcriptional errors that result from this process are unintentional.

## 2015-06-09 NOTE — ED Notes (Signed)
Per EMS, patient was vomiting "green bile" and found hard to arouse.  She is normally "chatty" at [facility], but tonight is out of it.  She presents to the ER tonight with eyes closed and lethargic making incomprehensible sounds.

## 2015-06-09 NOTE — ED Notes (Signed)
MD at bedside. 

## 2015-06-10 ENCOUNTER — Inpatient Hospital Stay: Payer: Medicare Other

## 2015-06-10 DIAGNOSIS — N189 Chronic kidney disease, unspecified: Secondary | ICD-10-CM

## 2015-06-10 DIAGNOSIS — R4182 Altered mental status, unspecified: Secondary | ICD-10-CM

## 2015-06-10 DIAGNOSIS — Z808 Family history of malignant neoplasm of other organs or systems: Secondary | ICD-10-CM

## 2015-06-10 DIAGNOSIS — K219 Gastro-esophageal reflux disease without esophagitis: Secondary | ICD-10-CM

## 2015-06-10 DIAGNOSIS — E7800 Pure hypercholesterolemia, unspecified: Secondary | ICD-10-CM

## 2015-06-10 DIAGNOSIS — K922 Gastrointestinal hemorrhage, unspecified: Secondary | ICD-10-CM

## 2015-06-10 DIAGNOSIS — D801 Nonfamilial hypogammaglobulinemia: Secondary | ICD-10-CM

## 2015-06-10 DIAGNOSIS — F0390 Unspecified dementia without behavioral disturbance: Secondary | ICD-10-CM

## 2015-06-10 DIAGNOSIS — I129 Hypertensive chronic kidney disease with stage 1 through stage 4 chronic kidney disease, or unspecified chronic kidney disease: Secondary | ICD-10-CM

## 2015-06-10 DIAGNOSIS — C541 Malignant neoplasm of endometrium: Secondary | ICD-10-CM

## 2015-06-10 DIAGNOSIS — I509 Heart failure, unspecified: Secondary | ICD-10-CM

## 2015-06-10 DIAGNOSIS — I10 Essential (primary) hypertension: Secondary | ICD-10-CM

## 2015-06-10 DIAGNOSIS — Z8619 Personal history of other infectious and parasitic diseases: Secondary | ICD-10-CM

## 2015-06-10 DIAGNOSIS — M129 Arthropathy, unspecified: Secondary | ICD-10-CM

## 2015-06-10 DIAGNOSIS — G9340 Encephalopathy, unspecified: Secondary | ICD-10-CM | POA: Diagnosis present

## 2015-06-10 DIAGNOSIS — R32 Unspecified urinary incontinence: Secondary | ICD-10-CM

## 2015-06-10 DIAGNOSIS — E119 Type 2 diabetes mellitus without complications: Secondary | ICD-10-CM

## 2015-06-10 DIAGNOSIS — I96 Gangrene, not elsewhere classified: Secondary | ICD-10-CM

## 2015-06-10 DIAGNOSIS — I739 Peripheral vascular disease, unspecified: Secondary | ICD-10-CM

## 2015-06-10 DIAGNOSIS — D631 Anemia in chronic kidney disease: Secondary | ICD-10-CM

## 2015-06-10 DIAGNOSIS — A419 Sepsis, unspecified organism: Secondary | ICD-10-CM

## 2015-06-10 DIAGNOSIS — L89301 Pressure ulcer of unspecified buttock, stage 1: Secondary | ICD-10-CM

## 2015-06-10 DIAGNOSIS — I2510 Atherosclerotic heart disease of native coronary artery without angina pectoris: Secondary | ICD-10-CM

## 2015-06-10 DIAGNOSIS — Z8541 Personal history of malignant neoplasm of cervix uteri: Secondary | ICD-10-CM

## 2015-06-10 DIAGNOSIS — Z86711 Personal history of pulmonary embolism: Secondary | ICD-10-CM

## 2015-06-10 DIAGNOSIS — I252 Old myocardial infarction: Secondary | ICD-10-CM

## 2015-06-10 DIAGNOSIS — Z8551 Personal history of malignant neoplasm of bladder: Secondary | ICD-10-CM

## 2015-06-10 DIAGNOSIS — L89159 Pressure ulcer of sacral region, unspecified stage: Secondary | ICD-10-CM | POA: Diagnosis present

## 2015-06-10 LAB — BLOOD CULTURE ID PANEL (REFLEXED)
ACINETOBACTER BAUMANNII: NOT DETECTED
CANDIDA GLABRATA: NOT DETECTED
CANDIDA PARAPSILOSIS: NOT DETECTED
CANDIDA TROPICALIS: NOT DETECTED
CARBAPENEM RESISTANCE: NOT DETECTED
Candida albicans: NOT DETECTED
Candida krusei: NOT DETECTED
Enterobacter cloacae complex: NOT DETECTED
Enterobacteriaceae species: DETECTED — AB
Enterococcus species: DETECTED — AB
Escherichia coli: DETECTED — AB
HAEMOPHILUS INFLUENZAE: NOT DETECTED
KLEBSIELLA OXYTOCA: NOT DETECTED
KLEBSIELLA PNEUMONIAE: NOT DETECTED
Listeria monocytogenes: NOT DETECTED
Methicillin resistance: NOT DETECTED
NEISSERIA MENINGITIDIS: NOT DETECTED
PROTEUS SPECIES: DETECTED — AB
Pseudomonas aeruginosa: NOT DETECTED
STAPHYLOCOCCUS SPECIES: NOT DETECTED
STREPTOCOCCUS PNEUMONIAE: NOT DETECTED
Serratia marcescens: NOT DETECTED
Staphylococcus aureus (BCID): NOT DETECTED
Streptococcus agalactiae: NOT DETECTED
Streptococcus pyogenes: NOT DETECTED
Streptococcus species: NOT DETECTED
VANCOMYCIN RESISTANCE: DETECTED — AB

## 2015-06-10 LAB — CBC WITH DIFFERENTIAL/PLATELET
BASOS ABS: 0.1 10*3/uL (ref 0–0.1)
BASOS PCT: 0 %
EOS ABS: 0 10*3/uL (ref 0–0.7)
Eosinophils Relative: 0 %
HEMATOCRIT: 31.4 % — AB (ref 35.0–47.0)
HEMOGLOBIN: 9.9 g/dL — AB (ref 12.0–16.0)
Lymphocytes Relative: 4 %
Lymphs Abs: 0.8 10*3/uL — ABNORMAL LOW (ref 1.0–3.6)
MCH: 26.7 pg (ref 26.0–34.0)
MCHC: 31.6 g/dL — ABNORMAL LOW (ref 32.0–36.0)
MCV: 84.4 fL (ref 80.0–100.0)
Monocytes Absolute: 0.8 10*3/uL (ref 0.2–0.9)
Monocytes Relative: 4 %
NEUTROS ABS: 18.3 10*3/uL — AB (ref 1.4–6.5)
NEUTROS PCT: 92 %
Platelets: 515 10*3/uL — ABNORMAL HIGH (ref 150–440)
RBC: 3.72 MIL/uL — AB (ref 3.80–5.20)
RDW: 18.1 % — ABNORMAL HIGH (ref 11.5–14.5)
WBC: 20 10*3/uL — AB (ref 3.6–11.0)

## 2015-06-10 LAB — GLUCOSE, CAPILLARY
GLUCOSE-CAPILLARY: 330 mg/dL — AB (ref 65–99)
GLUCOSE-CAPILLARY: 215 mg/dL — AB (ref 65–99)
GLUCOSE-CAPILLARY: 211 mg/dL — AB (ref 65–99)
Glucose-Capillary: 274 mg/dL — ABNORMAL HIGH (ref 65–99)
Glucose-Capillary: 231 mg/dL — ABNORMAL HIGH (ref 65–99)

## 2015-06-10 LAB — BASIC METABOLIC PANEL
ANION GAP: 15 (ref 5–15)
BUN: 27 mg/dL — ABNORMAL HIGH (ref 6–20)
CHLORIDE: 110 mmol/L (ref 101–111)
CO2: 17 mmol/L — AB (ref 22–32)
Calcium: 8.2 mg/dL — ABNORMAL LOW (ref 8.9–10.3)
Creatinine, Ser: 1.01 mg/dL — ABNORMAL HIGH (ref 0.44–1.00)
GFR calc non Af Amer: 52 mL/min — ABNORMAL LOW (ref >60)
Glucose, Bld: 352 mg/dL — ABNORMAL HIGH (ref 65–99)
Potassium: 4.2 mmol/L (ref 3.5–5.1)
SODIUM: 142 mmol/L (ref 135–145)

## 2015-06-10 LAB — LACTIC ACID, PLASMA: LACTIC ACID, VENOUS: 3.4 mmol/L — AB (ref 0.5–2.0)

## 2015-06-10 LAB — HEMOGLOBIN: Hemoglobin: 9 g/dL — ABNORMAL LOW (ref 12.0–16.0)

## 2015-06-10 LAB — PREPARE RBC (CROSSMATCH)

## 2015-06-10 MED ORDER — AMLODIPINE BESYLATE 5 MG PO TABS: 5.0000 mg | ORAL_TABLET | Freq: Every day | ORAL | Status: DC

## 2015-06-10 MED ORDER — OXYCODONE HCL 5 MG PO TABS: 15.0000 mg | ORAL_TABLET | Freq: Four times a day (QID) | ORAL | Status: DC | PRN

## 2015-06-10 MED ORDER — INSULIN ASPART 100 UNIT/ML ~~LOC~~ SOLN
0.0000 [IU] | Freq: Three times a day (TID) | SUBCUTANEOUS | Status: DC
  Administered 2015-06-10: 18:00:00 3 [IU] via SUBCUTANEOUS
  Administered 2015-06-10: 5 [IU] via SUBCUTANEOUS
  Administered 2015-06-11 (×2): 1 [IU] via SUBCUTANEOUS
  Filled 2015-06-10: qty 1
  Filled 2015-06-10: qty 3
  Filled 2015-06-10: qty 1
  Filled 2015-06-10: qty 3

## 2015-06-10 MED ORDER — SODIUM CHLORIDE 0.9 % IV SOLN
INTRAVENOUS | Status: DC
  Administered 2015-06-10 – 2015-06-11 (×5): via INTRAVENOUS

## 2015-06-10 MED ORDER — TAMSULOSIN HCL 0.4 MG PO CAPS: 0.4000 mg | ORAL_CAPSULE | Freq: Two times a day (BID) | ORAL | Status: DC

## 2015-06-10 MED ORDER — INSULIN DETEMIR 100 UNIT/ML ~~LOC~~ SOLN
15.0000 [IU] | Freq: Every day | SUBCUTANEOUS | Status: DC
  Administered 2015-06-10: 22:00:00 15 [IU] via SUBCUTANEOUS
  Filled 2015-06-10 (×2): qty 0.15

## 2015-06-10 MED ORDER — PIPERACILLIN-TAZOBACTAM 4.5 G IVPB
4.5000 g | Freq: Three times a day (TID) | INTRAVENOUS | Status: DC
  Administered 2015-06-10: 06:00:00 4.5 g via INTRAVENOUS
  Filled 2015-06-10: qty 100

## 2015-06-10 MED ORDER — MEGESTROL ACETATE 400 MG/10ML PO SUSP: 200.0000 mg | Freq: Every day | ORAL | Status: DC

## 2015-06-10 MED ORDER — LINEZOLID 600 MG/300ML IV SOLN
600.0000 mg | Freq: Two times a day (BID) | INTRAVENOUS | Status: DC
  Administered 2015-06-10 – 2015-06-11 (×3): 600 mg via INTRAVENOUS
  Filled 2015-06-10 (×4): qty 300

## 2015-06-10 MED ORDER — SODIUM CHLORIDE 0.9 % IV BOLUS (SEPSIS)
500.0000 mL | Freq: Once | INTRAVENOUS | Status: AC
  Administered 2015-06-10: 500 mL via INTRAVENOUS

## 2015-06-10 MED ORDER — MEGESTROL ACETATE 625 MG/5ML PO SUSP: 200.0000 mg | Freq: Every day | ORAL | Status: DC

## 2015-06-10 MED ORDER — MAGNESIUM OXIDE 400 (241.3 MG) MG PO TABS: 400.0000 mg | ORAL_TABLET | Freq: Every day | ORAL | Status: DC

## 2015-06-10 MED ORDER — SODIUM CHLORIDE 0.9 % IV SOLN
8.0000 mg/h | INTRAVENOUS | Status: DC
  Administered 2015-06-10 – 2015-06-11 (×4): 8 mg/h via INTRAVENOUS
  Filled 2015-06-10 (×4): qty 80

## 2015-06-10 MED ORDER — ASPIRIN 81 MG PO CHEW: 81.0000 mg | CHEWABLE_TABLET | Freq: Every day | ORAL | Status: DC

## 2015-06-10 MED ORDER — RISPERIDONE 0.5 MG PO TABS: 0.2500 mg | ORAL_TABLET | Freq: Every day | ORAL | Status: DC

## 2015-06-10 MED ORDER — ALPRAZOLAM 0.5 MG PO TABS: 0.5000 mg | ORAL_TABLET | Freq: Two times a day (BID) | ORAL | Status: DC | PRN

## 2015-06-10 MED ORDER — HALOPERIDOL LACTATE 5 MG/ML IJ SOLN
5.0000 mg | Freq: Once | INTRAMUSCULAR | Status: AC
  Administered 2015-06-10: 01:00:00 5 mg via INTRAVENOUS
  Filled 2015-06-10: qty 1

## 2015-06-10 MED ORDER — SERTRALINE HCL 50 MG PO TABS: 25.0000 mg | ORAL_TABLET | Freq: Every day | ORAL | Status: DC

## 2015-06-10 MED ORDER — PIPERACILLIN-TAZOBACTAM 4.5 G IVPB
4.5000 g | Freq: Three times a day (TID) | INTRAVENOUS | Status: DC
  Filled 2015-06-10 (×2): qty 100

## 2015-06-10 MED ORDER — METOPROLOL TARTRATE 25 MG PO TABS: 25.0000 mg | ORAL_TABLET | Freq: Two times a day (BID) | ORAL | Status: DC

## 2015-06-10 MED ORDER — SODIUM CHLORIDE 0.9 % IV SOLN
80.0000 mg | Freq: Once | INTRAVENOUS | Status: AC
  Administered 2015-06-10: 80 mg via INTRAVENOUS
  Filled 2015-06-10 (×2): qty 80

## 2015-06-10 MED ORDER — MIRTAZAPINE 15 MG PO TABS: 15.0000 mg | ORAL_TABLET | Freq: Every day | ORAL | Status: DC

## 2015-06-10 MED ORDER — LABETALOL HCL 5 MG/ML IV SOLN
10.0000 mg | INTRAVENOUS | Status: DC | PRN
  Administered 2015-06-11: 15:00:00 10 mg via INTRAVENOUS
  Filled 2015-06-10 (×2): qty 4

## 2015-06-10 MED ORDER — HYDROCODONE-ACETAMINOPHEN 5-325 MG PO TABS: 1.0000 | ORAL_TABLET | ORAL | Status: DC | PRN

## 2015-06-10 MED ORDER — PANTOPRAZOLE SODIUM 40 MG IV SOLR
40.0000 mg | Freq: Two times a day (BID) | INTRAVENOUS | Status: DC
  Filled 2015-06-10: qty 40

## 2015-06-10 MED ORDER — VANCOMYCIN HCL IN DEXTROSE 750-5 MG/150ML-% IV SOLN
750.0000 mg | INTRAVENOUS | Status: DC
  Filled 2015-06-10: qty 150

## 2015-06-10 MED ORDER — PIPERACILLIN-TAZOBACTAM 3.375 G IVPB
3.3750 g | Freq: Three times a day (TID) | INTRAVENOUS | Status: DC
  Administered 2015-06-10 – 2015-06-11 (×4): 3.375 g via INTRAVENOUS
  Filled 2015-06-10 (×6): qty 50

## 2015-06-10 NOTE — Consult Note (Signed)
[place]  [phone] [phone]  ID: [place] OB: May 25, 1936  MR#: [id number]  [id number]  Patient Care Team: Arlis Porta., MD as PCP - General (Family Medicine)  CHIEF COMPLAINT:  Chief Complaint  Patient presents with  . Altered Mental Status    INTERVAL HISTORY: Patient is a 80 year old female with known metastatic endometrial cancer admitted with worsening altered mental status. She was last evaluated by Dr. Mike Gip in clinic on April 29, 2015 at which time it was determined that no chemotherapy would be given secondary to declining performance status. There is no family at bedside and review of systems is unobtainable.  REVIEW OF SYSTEMS:   Review of Systems  Unable to perform ROS: medical condition    As per HPI. Otherwise, a complete review of systems is negatve.  PAST MEDICAL HISTORY: Past Medical History  Diagnosis Date  . Diabetes mellitus without complication ([place])   . C. difficile diarrhea   . Uterine cancer ([place])   . Hypogammaglobulinemia ([place])   . Pulmonary embolism ([place])   . Decubitus ulcers   . Hypertension   . Hypercholesterolemia   . Incontinence of urine   . Cancer [facility])     Endometrial Cancer  . Cervical cancer ([place])     with radiation 45 years ago...  . Arthritis   . GERD (gastroesophageal reflux disease)   . Bladder cancer ([place])   . Decubitus ulcer of buttock, stage 1   . Peripheral vascular disease ([place])   . Anemia   . Coronary artery disease   . CHF (congestive heart failure) ([place])   . Shortness of breath dyspnea   . Hypoxia   . Gangrene of foot ([place])     right  . Myocardial infarction [facility])     nstemi 12/16  . Chronic kidney disease     acute on chronic renal failure 12/16  . Dementia   . Diabetes ([place])     PAST SURGICAL HISTORY: Past Surgical History  Procedure Laterality Date  . Total abdominal hysterectomy w/ bilateral salpingoophorectomy  07/29/2010  . Stent in  leg  2016  . Cataract extraction, bilateral    . Bladder surgery    . Femoral endarterectomy      right  . Leg surgery      right leg  . Portacath placement      right  . Esophagogastroduodenoscopy N/A 12/14/2014    Procedure: ESOPHAGOGASTRODUODENOSCOPY (EGD);  Surgeon: Hulen Luster, MD;  Location: [facility] ENDOSCOPY;  Service: Endoscopy;  Laterality: N/A;  . Peripheral vascular catheterization Right 04/05/2015    Procedure: Lower Extremity Angiography;  Surgeon: Katha Cabal, MD;  Location: [place] CV LAB;  Service: Cardiovascular;  Laterality: Right;  . Peripheral vascular catheterization  04/05/2015    Procedure: Lower Extremity Intervention;  Surgeon: Katha Cabal, MD;  Location: [place] CV LAB;  Service: Cardiovascular;;  . Peripheral vascular catheterization Right 05/14/2015    Procedure: Lower Extremity Angiography;  Surgeon: Katha Cabal, MD;  Location: [place] CV LAB;  Service: Cardiovascular;  Laterality: Right;  . Peripheral vascular catheterization Right 05/14/2015    Procedure: Lower Extremity Intervention;  Surgeon: Katha Cabal, MD;  Location: [place] CV LAB;  Service: Cardiovascular;  Laterality: Right;  . Angioplasty    . Amputation Right 05/29/2015    Procedure: AMPUTATION BELOW KNEE;  Surgeon: Katha Cabal, MD;  Location: [facility] ORS;  Service: Vascular;  Laterality: Right;    FAMILY  HISTORY Family History  Problem Relation Age of Onset  . Hypertension Mother   . Diabetes Mellitus II Mother   . Coronary artery disease Mother   . Hypertension Father   . Diabetes Mellitus II Father   . CAD Father   . Diabetes Mellitus II Sister   . Hypertension Sister   . Diabetes Mellitus II Brother   . Hypertension Brother   . Cancer Brother        ADVANCED DIRECTIVES:    HEALTH MAINTENANCE: Social History  Substance Use Topics  . Smoking status: Never Smoker   . Smokeless tobacco: None  . Alcohol Use: No      Colonoscopy:  PAP:  Bone density:  Lipid panel:  Allergies  Allergen Reactions  . Contrast Media [Iodinated Diagnostic Agents]     Kidneys are operational at 28% and wont use dyes    Current Facility-Administered Medications  Medication Dose Route Frequency Provider Last Rate Last Dose  . 0.9 %  sodium chloride infusion   Intravenous Continuous Lytle Butte, MD 100 mL/hr at 06/10/15 0730    . acetaminophen (TYLENOL) tablet 650 mg  650 mg Oral Q6H PRN Hillary Bow, MD       Or  . acetaminophen (TYLENOL) suppository 650 mg  650 mg Rectal Q6H PRN Srikar Sudini, MD      . albuterol (PROVENTIL) (2.5 MG/3ML) 0.083% nebulizer solution 2.5 mg  2.5 mg Nebulization Q2H PRN Srikar Sudini, MD      . ALPRAZolam Duanne Moron) tablet 0.5 mg  0.5 mg Oral BID PRN Srikar Sudini, MD      . amLODipine (NORVASC) tablet 5 mg  5 mg Oral Daily Srikar Sudini, MD      . bisacodyl (DULCOLAX) EC tablet 5 mg  5 mg Oral Daily PRN Srikar Sudini, MD      . docusate sodium (COLACE) capsule 100 mg  100 mg Oral BID Hillary Bow, MD   100 mg at 06/10/15 0128  . HYDROcodone-acetaminophen (NORCO/VICODIN) 5-325 MG per tablet 1-2 tablet  1-2 tablet Oral Q4H PRN Srikar Sudini, MD      . insulin aspart (novoLOG) injection 0-9 Units  0-9 Units Subcutaneous TID WC Epifanio Lesches, MD   5 Units at 06/10/15 1128  . insulin detemir (LEVEMIR) injection 15 Units  15 Units Subcutaneous QHS Srikar Sudini, MD      . labetalol (NORMODYNE,TRANDATE) injection 10 mg  10 mg Intravenous Q4H PRN Srikar Sudini, MD      . linezolid (ZYVOX) IVPB 600 mg  600 mg Intravenous Q12H Epifanio Lesches, MD      . magnesium oxide (MAG-OX) tablet 400 mg  400 mg Oral Daily Srikar Sudini, MD      . megestrol (MEGACE) 400 MG/10ML suspension 200 mg  200 mg Oral Daily Srikar Sudini, MD      . metoprolol tartrate (LOPRESSOR) tablet 25 mg  25 mg Oral BID Hillary Bow, MD   Stopped at 06/10/15 0300  . mirtazapine (REMERON) tablet 15 mg  15 mg Oral QHS  Srikar Sudini, MD      . morphine 2 MG/ML injection 2 mg  2 mg Intravenous Q4H PRN Hillary Bow, MD   2 mg at 06/10/15 0129  . ondansetron (ZOFRAN) tablet 4 mg  4 mg Oral Q6H PRN Srikar Sudini, MD       Or  . ondansetron (ZOFRAN) injection 4 mg  4 mg Intravenous Q6H PRN Srikar Sudini, MD      . oxyCODONE (Oxy IR/ROXICODONE) immediate  release tablet 15 mg  15 mg Oral Q6H PRN Srikar Sudini, MD      . pantoprazole (PROTONIX) 80 mg in sodium chloride 0.9 % 250 mL (0.32 mg/mL) infusion  8 mg/hr Intravenous Continuous Hillary Bow, MD 25 mL/hr at 06/10/15 0726 8 mg/hr at 06/10/15 0726  . [START ON 06/13/2015] pantoprazole (PROTONIX) injection 40 mg  40 mg Intravenous Q12H Srikar Sudini, MD      . piperacillin-tazobactam (ZOSYN) IVPB 3.375 g  3.375 g Intravenous 3 times per day Srikar Sudini, MD      . polyethylene glycol (MIRALAX / GLYCOLAX) packet 17 g  17 g Oral Daily PRN Srikar Sudini, MD      . risperiDONE (RISPERDAL) tablet 0.25 mg  0.25 mg Oral QHS Srikar Sudini, MD      . sertraline (ZOLOFT) tablet 25 mg  25 mg Oral Daily Srikar Sudini, MD      . sodium phosphate (FLEET) 7-19 GM/118ML enema 1 enema  1 enema Rectal Once PRN Srikar Sudini, MD      . tamsulosin (FLOMAX) capsule 0.4 mg  0.4 mg Oral BID Hillary Bow, MD   Stopped at 06/10/15 0301   Facility-Administered Medications Ordered in Other Encounters  Medication Dose Route Frequency Provider Last Rate Last Dose  . sodium chloride 0.9 % injection 10 mL  10 mL Intravenous PRN Evlyn Kanner, NP   10 mL at 11/02/14 0904    OBJECTIVE: Filed Vitals:   06/10/15 0000 06/10/15 0500  BP: 171/74 157/54  Pulse: 101 110  Temp:  98.4 F (36.9 C)  Resp: 21 18     Body mass index is 29.45 kg/(m^2).    ECOG FS:4 - Bedbound  General: ill-appearing, no acute distress. Lungs: Clear to auscultation bilaterally. Heart: Regular rate and rhythm. No rubs, murmurs, or gallops. Abdomen: Soft, nontender, nondistended. No organomegaly noted, normoactive  bowel sounds. Musculoskeletal: No edema, cyanosis, or clubbing. Neuro: Unresponsive. Skin: No rashes or petechiae noted.  LAB RESULTS:  Lab Results  Component Value Date   NA 142 06/10/2015   K 4.2 06/10/2015   CL 110 06/10/2015   CO2 17* 06/10/2015   GLUCOSE 352* 06/10/2015   BUN 27* 06/10/2015   CREATININE 1.01* 06/10/2015   CALCIUM 8.2* 06/10/2015   PROT 7.3 06/09/2015   ALBUMIN 2.4* 06/09/2015   AST 308* 06/09/2015   ALT 126* 06/09/2015   ALKPHOS 47 06/09/2015   BILITOT 0.8 06/09/2015   GFRNONAA 52* 06/10/2015   GFRAA >60 06/10/2015    Lab Results  Component Value Date   WBC 20.0* 06/10/2015   NEUTROABS 18.3* 06/10/2015   HGB 9.0* 06/10/2015   HCT 31.4* 06/10/2015   MCV 84.4 06/10/2015   PLT 515* 06/10/2015     STUDIES: Ct Abdomen Pelvis Wo Contrast  06/10/2015  CLINICAL DATA:  Acute onset of vomiting. Sepsis. Suggestion of mild hematemesis. Diffuse abdominal pain and elevated lactic acid. Initial encounter. EXAM: CT ABDOMEN AND PELVIS WITHOUT CONTRAST TECHNIQUE: Multidetector CT imaging of the abdomen and pelvis was performed following the standard protocol without IV contrast. COMPARISON:  CT of the abdomen and pelvis from 11/21/2014 FINDINGS: Trace bilateral pleural effusions are noted, with mild atelectasis. A 1.1 cm nodule is noted at the right lower lobe (image 1 of 18). Scattered coronary artery calcifications are seen. Trace pericardial fluid likely remains within normal limits. An enteric tube is noted extending to the body of the stomach. The liver and spleen are unremarkable in appearance. Tiny stones are noted dependently in  the gallbladder. The gallbladder is otherwise unremarkable. The pancreas and adrenal glands are unremarkable. Nonspecific perinephric stranding is noted bilaterally. Mild left renal scarring is noted. There is no evidence of hydronephrosis. No renal or ureteral stones are identified. Scattered vascular calcifications are noted at the renal  hila bilaterally. Masses about the retroperitoneum have increased in size, the largest of which appears to partially extend into the inferior vena cava, measuring approximately 5.9 x 4.5 cm. This extends distal to the aortic bifurcation. Enlarged mesenteric nodes measure up to 1.8 cm in short axis. The small bowel is unremarkable in appearance. The stomach is within normal limits. No acute vascular abnormalities are seen. Scattered calcification is noted along the celiac trunk, superior mesenteric artery and at the origins of the renal arteries bilaterally. The appendix is normal in caliber, without evidence of appendicitis. The colon is grossly unremarkable in appearance. The bladder is decompressed, with a Foley catheter in place. A small amount of free fluid is seen within the pelvis. The patient is status post hysterectomy. No suspicious adnexal masses are seen. No inguinal lymphadenopathy is seen. No acute osseous abnormalities are identified. IMPRESSION: 1. Masses about the retroperitoneum have increased in size, the largest of which appears to partially extend into the inferior vena cava, measuring 5.9 x 4.5 cm. This extends distal to the aortic bifurcation. Enlarged mesenteric nodes measure up to 1.8 cm in short axis. 2. 1.1 cm nodule at the right lower lobe is nonspecific, but could reflect metastatic disease. 3. Trace bilateral pleural effusions, with mild atelectasis. 4. Scattered coronary artery calcifications seen. 5. Cholelithiasis; gallbladder otherwise unremarkable. 6. Mild left renal scarring noted. 7. Scattered scattered calcification along the celiac trunk, superior mesenteric artery, and at the origins of the renal arteries bilaterally. Electronically Signed   By: Garald Balding M.D.   On: 06/10/2015 06:37   Dg Chest 2 View  05/16/2015  CLINICAL DATA:  Pulmonary edema. EXAM: CHEST  2 VIEW COMPARISON:  05/15/2015 FINDINGS: Improved pulmonary edema. Small bilateral pleural effusion, greater on  the left. Chronic cardiomegaly. Porta catheter from the right, tip still at the SVC level. Calcified peritracheal nodes on the right. There is a 21 mm nodule over the right perihilar lung which appears increased from July 2016, suspected metastatic disease in this patient with metastatic retroperitoneal adenopathy. IMPRESSION: 1. Improved pulmonary edema.  Stable small pleural effusions. 2. 2 cm right perihilar nodule which appears increased from July 2016. Electronically Signed   By: Monte Fantasia M.D.   On: 05/16/2015 13:42   Dg Abd 1 View  06/10/2015  CLINICAL DATA:  Enteric tube placement.  Initial encounter. EXAM: ABDOMEN - 1 VIEW COMPARISON:  Abdominal radiograph performed earlier today at 3:19 a.m. FINDINGS: The patient's enteric tube is noted ending overlying the body of the stomach. The visualized bowel gas pattern is grossly unremarkable. No free intra-abdominal air is seen, though evaluation for free air is limited on a single supine view. No acute osseous abnormalities are identified. Mild degenerative change is noted along the lower thoracic spine. Vascular stents are seen overlying the upper pelvis. IMPRESSION: Enteric tube noted ending overlying the body of the stomach. Electronically Signed   By: Garald Balding M.D.   On: 06/10/2015 04:05   Dg Abd 1 View  06/10/2015  CLINICAL DATA:  Nasogastric tube placement.  Initial encounter. EXAM: ABDOMEN - 1 VIEW COMPARISON:  Abdominal radiograph performed earlier today at 12:22 a.m. FINDINGS: The patient's enteric tube is noted coiling at the distal esophagus.  The visualized bowel gas pattern is grossly unremarkable. A moderate amount of air is seen in the stomach. No free intra-abdominal air is seen, though evaluation for free air is limited on a single supine view. Mild degenerative change is noted along the lower thoracic spine. Vascular stents are noted overlying the upper pelvis. IMPRESSION: 1. Enteric tube noted coiling at the distal esophagus.  This was subsequently repositioned. 2. Unremarkable bowel gas pattern; no free intra-abdominal air seen. Electronically Signed   By: Garald Balding M.D.   On: 06/10/2015 04:02   Dg Abd 1 View  06/10/2015  CLINICAL DATA:  Vomiting. EXAM: ABDOMEN - 1 VIEW COMPARISON:  Most recent CT 11/21/2014 FINDINGS: Air within the stomach without gastric distention. No small bowel dilatation. Small volume of stool throughout the colon without colonic dilatation. No evidence of free air on supine views. Extensive atherosclerotic calcification of the abdominal vasculature with vascular stents in the pelvis. Splenic granuloma are noted. IMPRESSION: Nonobstructive bowel gas pattern. Air in the stomach without gastric distension. Electronically Signed   By: Jeb Levering M.D.   On: 06/10/2015 01:11   US Renal  05/17/2015  CLINICAL DATA:  Stage IV chronic renal disease EXAM: RENAL ULTRASOUND COMPARISON:  CT abdomen and pelvis November 21, 2014 FINDINGS: Right Kidney: Length: 11.5 cm. Echogenicity within normal limits. There is renal cortical thinning. No mass, perinephric fluid, or hydronephrosis visualized. There is focal scarring along the periphery of the lower pole of the right kidney. There is no sonographically demonstrable calculus or ureterectasis. Left Kidney: Length: 8.3 cm. Echogenicity within normal limits. There is renal cortical thinning. No mass, perinephric fluid, or hydronephrosis visualized. No sonographically demonstrable calculus or ureterectasis. Bladder: Urinary bladder is decompressed and cannot be assessed at this time. IMPRESSION: Bilateral renal cortical thinning, a finding that may be seen with medical renal disease. The renal echogenicity is felt to be within normal limits. No obstructing focus identified on either side. There is scarring in the lower pole right kidney region. There is size discrepancy between the kidneys with left kidney smaller than the right. This finding potentially may be indicative  of renal artery stenosis on the left. In this regard, question whether patient is hypertensive. Electronically Signed   By: Lowella Grip III M.D.   On: 05/17/2015 17:17   Nm Pulmonary Perf And Vent  05/16/2015  CLINICAL DATA:  Pulmonary edema. EXAM: NUCLEAR MEDICINE VENTILATION - PERFUSION LUNG SCAN TECHNIQUE: Ventilation images were obtained in multiple projections using inhaled aerosol Tc-75m DTPA. Perfusion images were obtained in multiple projections after intravenous injection of Tc-34m MAA. RADIOPHARMACEUTICALS:  32.7 Technetium-76m DTPA aerosol inhalation. No technetium 99 m MAA administered. COMPARISON:  Chest x-ray 05/26/2015. FINDINGS: No ventilation perfusion images obtained as patient would only allow ventilation for approximately 1 minute. The patient refused remainder of the exam. IMPRESSION: Patient refused exam just after ventilation.  No images obtained. Electronically Signed   By: [place]   On: 05/16/2015 14:54   Dg Chest Port 1 View  06/09/2015  CLINICAL DATA:  Acute onset of altered mental status. Initial encounter. EXAM: PORTABLE CHEST 1 VIEW COMPARISON:  Chest radiograph performed 05/16/2015 FINDINGS: The lungs are well-aerated. A 1.9 cm nodule is again noted at the right midlung zone, concerning for metastatic disease given the patient's history. There is no evidence of pleural effusion or pneumothorax. The cardiomediastinal silhouette is borderline normal in size. No acute osseous abnormalities are seen. A right-sided chest port is noted ending about the distal SVC. IMPRESSION:  1.9 cm nodule again noted at the right midlung zone, concerning for metastatic disease given the patient's history. Lungs otherwise grossly clear. Electronically Signed   By: Garald Balding M.D.   On: 06/09/2015 22:57   Dg Chest Port 1 View  05/15/2015  CLINICAL DATA:  Confusion. Difficulty breathing. History of endometrial carcinoma. EXAM: PORTABLE CHEST 1 VIEW COMPARISON:  December 12, 2014 FINDINGS:  There is airspace consolidation throughout much of the right mid and lower lung zones. A lesser degree of infiltrate is noted in the left base. There is also underlying interstitial edema. Heart is slightly enlarged with pulmonary venous hypertension. No adenopathy. Port-A-Cath tip is in the superior vena cava. No pneumothorax. IMPRESSION: Airspace consolidation in both lung bases as well as in the right mid lung. Underlying congestive heart failure. The airspace opacity may represent alveolar edema, although the appearance is concerning for superimposed pneumonia. Note that a nodular opacity in the right mid lung noted previously is obscured by infiltrate currently. Electronically Signed   By: Lowella Grip III M.D.   On: 05/15/2015 16:15    ASSESSMENT: Stage IV endometrial cancer, altered mental status.  PLAN:    1. Stage IV endometrial cancer: Please see Dr. Kem Parkinson note dated April 29, 2015 for an extensive review of patient's oncology history. Given patient's performance status, no chemotherapy or treatments are planned at this time.  Consider hospice referral. 2. Sacral decubitus: Patient is being evaluated by surgery today. 3. Altered mental status: Likely multifactorial, including sepsis. Consider hospice as above. 4. Sepsis: Continue current antibiotics as prescribed. 5. GI bleed: Appreciate GI input. Maintain hemoglobin greater than 7.0.  Appreciate consult, will follow.  Lloyd Huger, MD   06/10/2015 1:47 PM

## 2015-06-10 NOTE — Plan of Care (Signed)
Problem: Education: Goal: Knowledge of [facility] Education information/materials will improve Outcome: Not Progressing Have spoken to daughter and sister today about pt's condition. Pt is unable to be taught at this time secondary to LOC

## 2015-06-10 NOTE — Plan of Care (Signed)
Problem: Safety: Goal: Ability to remain free from injury will improve Outcome: Progressing Pt has not pulled out any tubes or lines this shift.  Minimal spontaneous movement  Problem: Pain Managment: Goal: General experience of comfort will improve Outcome: Progressing Pt appears comfortable no obvious signs of pain or discomfort.  Problem: Skin Integrity: Goal: Risk for impaired skin integrity will decrease Outcome: Not Progressing Surgeon consult completed on sacral pressure injury.  Pt turned every 2 hours Multiple pressure injury sites .  Staples in place on stump.

## 2015-06-10 NOTE — Consult Note (Signed)
Pt rectal exam showed rectum full of stool , no blood on finger or in anal area.  Will need something to help bowels to move.  Can;t give anything from above while on suction. Will need fleets enema or digital disimpaction.

## 2015-06-10 NOTE — Consult Note (Signed)
Patient ID: Shelley Fields, female   DOB: July 01, 1935, 80 y.o.   MRN: [id number]  CC: ALTERED MENTAL STATUS  HPI Shelley Fields is a 80 y.o. female admitted to the medicine service due to altered mental status. Surgery consultation for evaluation of sacral decubitus ulcer. Patient is known to have recurrent metastatic stage IV uterine cancer. Current admission is thought to be secondary to infected sacral decubitus however she's had multiple bouts of sepsis secondary to UTI as well. She's also had recurrent C. difficile infection. Patient remains altered during my evaluation and does not dissipate in the exam or provide any history.  HPI  Past Medical History  Diagnosis Date  . Diabetes mellitus without complication ([place])   . C. difficile diarrhea   . Uterine cancer ([place])   . Hypogammaglobulinemia ([place])   . Pulmonary embolism ([place])   . Decubitus ulcers   . Hypertension   . Hypercholesterolemia   . Incontinence of urine   . Cancer [facility])     Endometrial Cancer  . Cervical cancer ([place])     with radiation 45 years ago...  . Arthritis   . GERD (gastroesophageal reflux disease)   . Bladder cancer ([place])   . Decubitus ulcer of buttock, stage 1   . Peripheral vascular disease ([place])   . Anemia   . Coronary artery disease   . CHF (congestive heart failure) ([place])   . Shortness of breath dyspnea   . Hypoxia   . Gangrene of foot ([place])     right  . Myocardial infarction [facility])     nstemi 12/16  . Chronic kidney disease     acute on chronic renal failure 12/16  . Dementia   . Diabetes [facility])     Past Surgical History  Procedure Laterality Date  . Total abdominal hysterectomy w/ bilateral salpingoophorectomy  07/29/2010  . Stent in leg  2016  . Cataract extraction, bilateral    . Bladder surgery    . Femoral endarterectomy      right  . Leg surgery      right leg  . Portacath placement      right  . Esophagogastroduodenoscopy N/A 12/14/2014    Procedure: ESOPHAGOGASTRODUODENOSCOPY  (EGD);  Surgeon: Hulen Luster, MD;  Location: [facility] ENDOSCOPY;  Service: Endoscopy;  Laterality: N/A;  . Peripheral vascular catheterization Right 04/05/2015    Procedure: Lower Extremity Angiography;  Surgeon: Katha Cabal, MD;  Location: [place] CV LAB;  Service: Cardiovascular;  Laterality: Right;  . Peripheral vascular catheterization  04/05/2015    Procedure: Lower Extremity Intervention;  Surgeon: Katha Cabal, MD;  Location: [place] CV LAB;  Service: Cardiovascular;;  . Peripheral vascular catheterization Right 05/14/2015    Procedure: Lower Extremity Angiography;  Surgeon: Katha Cabal, MD;  Location: [place] CV LAB;  Service: Cardiovascular;  Laterality: Right;  . Peripheral vascular catheterization Right 05/14/2015    Procedure: Lower Extremity Intervention;  Surgeon: Katha Cabal, MD;  Location: [place] CV LAB;  Service: Cardiovascular;  Laterality: Right;  . Angioplasty    . Amputation Right 05/29/2015    Procedure: AMPUTATION BELOW KNEE;  Surgeon: Katha Cabal, MD;  Location: [facility] ORS;  Service: Vascular;  Laterality: Right;    Family History  Problem Relation Age of Onset  . Hypertension Mother   . Diabetes Mellitus II Mother   . Coronary artery disease Mother   . Hypertension Father   . Diabetes Mellitus II Father   . CAD  Father   . Diabetes Mellitus II Sister   . Hypertension Sister   . Diabetes Mellitus II Brother   . Hypertension Brother   . Cancer Brother     Social History Social History  Substance Use Topics  . Smoking status: Never Smoker   . Smokeless tobacco: None  . Alcohol Use: No    Allergies  Allergen Reactions  . Contrast Media [Iodinated Diagnostic Agents]     Kidneys are operational at 28% and wont use dyes    Current Facility-Administered Medications  Medication Dose Route Frequency Provider Last Rate Last Dose  . 0.9 %  sodium chloride infusion   Intravenous Continuous Lytle Butte, MD 100 mL/hr  at 06/10/15 0730    . acetaminophen (TYLENOL) tablet 650 mg  650 mg Oral Q6H PRN Hillary Bow, MD       Or  . acetaminophen (TYLENOL) suppository 650 mg  650 mg Rectal Q6H PRN Srikar Sudini, MD      . albuterol (PROVENTIL) (2.5 MG/3ML) 0.083% nebulizer solution 2.5 mg  2.5 mg Nebulization Q2H PRN Srikar Sudini, MD      . ALPRAZolam Duanne Moron) tablet 0.5 mg  0.5 mg Oral BID PRN Srikar Sudini, MD      . amLODipine (NORVASC) tablet 5 mg  5 mg Oral Daily Srikar Sudini, MD      . bisacodyl (DULCOLAX) EC tablet 5 mg  5 mg Oral Daily PRN Srikar Sudini, MD      . docusate sodium (COLACE) capsule 100 mg  100 mg Oral BID Hillary Bow, MD   100 mg at 06/10/15 0128  . HYDROcodone-acetaminophen (NORCO/VICODIN) 5-325 MG per tablet 1-2 tablet  1-2 tablet Oral Q4H PRN Srikar Sudini, MD      . insulin aspart (novoLOG) injection 0-9 Units  0-9 Units Subcutaneous TID WC Epifanio Lesches, MD   5 Units at 06/10/15 1128  . insulin detemir (LEVEMIR) injection 15 Units  15 Units Subcutaneous QHS Srikar Sudini, MD      . labetalol (NORMODYNE,TRANDATE) injection 10 mg  10 mg Intravenous Q4H PRN Srikar Sudini, MD      . linezolid (ZYVOX) IVPB 600 mg  600 mg Intravenous Q12H Epifanio Lesches, MD      . magnesium oxide (MAG-OX) tablet 400 mg  400 mg Oral Daily Srikar Sudini, MD      . megestrol (MEGACE) 400 MG/10ML suspension 200 mg  200 mg Oral Daily Srikar Sudini, MD      . metoprolol tartrate (LOPRESSOR) tablet 25 mg  25 mg Oral BID Hillary Bow, MD   Stopped at 06/10/15 0300  . mirtazapine (REMERON) tablet 15 mg  15 mg Oral QHS Srikar Sudini, MD      . morphine 2 MG/ML injection 2 mg  2 mg Intravenous Q4H PRN Hillary Bow, MD   2 mg at 06/10/15 0129  . ondansetron (ZOFRAN) tablet 4 mg  4 mg Oral Q6H PRN Hillary Bow, MD       Or  . ondansetron (ZOFRAN) injection 4 mg  4 mg Intravenous Q6H PRN Srikar Sudini, MD      . oxyCODONE (Oxy IR/ROXICODONE) immediate release tablet 15 mg  15 mg Oral Q6H PRN Srikar Sudini,  MD      . pantoprazole (PROTONIX) 80 mg in sodium chloride 0.9 % 250 mL (0.32 mg/mL) infusion  8 mg/hr Intravenous Continuous Hillary Bow, MD 25 mL/hr at 06/10/15 0726 8 mg/hr at 06/10/15 0726  . [START ON 06/13/2015] pantoprazole (PROTONIX) injection 40 mg  40 mg Intravenous Q12H Srikar Sudini, MD      . piperacillin-tazobactam (ZOSYN) IVPB 3.375 g  3.375 g Intravenous 3 times per day Srikar Sudini, MD      . polyethylene glycol (MIRALAX / GLYCOLAX) packet 17 g  17 g Oral Daily PRN Srikar Sudini, MD      . risperiDONE (RISPERDAL) tablet 0.25 mg  0.25 mg Oral QHS Srikar Sudini, MD      . sertraline (ZOLOFT) tablet 25 mg  25 mg Oral Daily Srikar Sudini, MD      . sodium phosphate (FLEET) 7-19 GM/118ML enema 1 enema  1 enema Rectal Once PRN Srikar Sudini, MD      . tamsulosin (FLOMAX) capsule 0.4 mg  0.4 mg Oral BID Hillary Bow, MD   Stopped at 06/10/15 0301   Facility-Administered Medications Ordered in Other Encounters  Medication Dose Route Frequency Provider Last Rate Last Dose  . sodium chloride 0.9 % injection 10 mL  10 mL Intravenous PRN Evlyn Kanner, NP   10 mL at 11/02/14 [id number]     Review of Systems Unable to be accomplished secondary to altered mental status  Physical Exam Blood pressure 157/54, pulse 110, temperature 98.4 F (36.9 C), temperature source Oral, resp. rate 18, weight 68.402 kg (150 lb 12.8 oz), SpO2 99 %. CONSTITUTIONAL: Ill appearing but in no acute distress. EYES: Pupils are equal, round, and reactive to light, Sclera are non-icteric. EARS, NOSE, MOUTH AND THROAT: The oropharynx is clear. Nasogastric tube in place LYMPH NODES:  Lymph nodes in the neck are normal. RESPIRATORY:  Lungs are clear. There is normal respiratory effort, with equal breath sounds bilaterally, and without pathologic use of accessory muscles. CARDIOVASCULAR: Heart is regular without murmurs, gallops, or rubs. GI: The abdomen is  soft, nontender. There are no palpable masses. There is no  hepatosplenomegaly. There are normal bowel sounds in all quadrants. GU: Rectal deferred.   MUSCULOSKELETAL: Diminished muscle strength and tone. No cyanosis or edema.   SKIN: Large stage IV sacral decubitus ulcer. Majority of the ulcer is covered by eschar. There is no purulent drainage or bogginess suggesting tissue that need to be removed. Minimal hyperemia around the edges of the wound without evidence of spreading cellulitis. Palpable bone in the base consistent with the stage IV nature of this ulcer. NEUROLOGIC: Patient does not participate in exam. PSYCH:  Patient does not participate in exam.  Data Reviewed Labs reviewed shows a white blood cell count 23,000 images reviewed which show progression of multiple areas of metastatic disease. I have personally reviewed the patient's imaging, laboratory findings and medical records.    Assessment    Stage IV sacral decubitus ulcer    Plan    Patient with numerous medical problems and oncologic problems. She does have a stage IV decubitus ulcer. This does not require surgical debridement. Continue to keep the area clean and dry. No indication for surgical intervention at this time.     Time spent with the patient was 30 minutes, with more than 50% of the time spent in face-to-face education, counseling and care coordination.     Clayburn Pert, MD FACS General Surgeon 06/10/2015, 2:03 PM

## 2015-06-10 NOTE — Progress Notes (Addendum)
Patient admitted earlier with sepsis due to sacral decubitus ulcer and acute encephalopathy. She did have one episode of vomiting in the emergency room which was bilious. Improved with Zofran. Patient continued to have vomiting after arrival on the medical floor. Dr. Nadara Mustard was paged earlier ordered for an NG tube. At this time an NG tube has been placed and is in the body of the stomach. Suction has red tinged vomiting.  On exam patient is drowsy and confused. S1 and S2 heard, tachycardic  Lungs clear to auscultation Abdomen diffusely tender No edema Decubitus ulcer not examined again  * Upper GI bleed Patient does have tachycardia. No hypotension. Hemoglobin is close to normal at 9.9. But she also is hemoconcentrated with her sepsis. We'll start her on protonic strip along with serial hemoglobin levels. So GI. Etiology is unclear. Could be Mallory-Weiss tear versus gastritis versus peptic ulcer disease. Patient made nothing by mouth and placed on telemetry monitor. Patient is critically ill. Ordered type and screen of blood along with holding 2 units of packed RBC if patient needs blood transfusion.  * Sepsis due to sacral decubitus ulcer Lactic acid continues to be elevated at 3.3. Bolus 1 L normal saline stat.  Due to her elevated lactic acid, vomiting and diffuse abdominal pain we will get a stat CT scan of the abdomen without IV contrast. Patient has allergy to contrast media.   Total critical care time spent was 34 minutes.

## 2015-06-10 NOTE — Progress Notes (Signed)
Received report from ED that patient was admitted for Sepsis due to decubitus ulcer on sacrum and for emesis that was "brownish".  Upon receipt of patient, she vomited thick brownish material that resembled stool.  Received orders for NG tube.  Tube placed and verified by x-ray.  Tube on low intermittent suction but only produces material when patient is lying flat.  Dr. Darvin Neighbours assessed patient and put in orders for protonix bolus and continuous.  Patient also on .09   Patient is very lethargic.  She does respond to simple commands.  She presents with multiple pressure ulcers upon admission.  Patient received Zosyn for sepsis due to stage IV ulcer on sacrum.

## 2015-06-10 NOTE — Progress Notes (Signed)
Pharmacy Antibiotic Follow-up Note  Shelley Fields is a 80 y.o. year-old female admitted on 06/09/2015.  The patient is currently on day 2 of Vancomycin and Zosyn for sepsis.  Assessment/Plan: BCID results - enterococcus (vancomcyin resistant), proteus, e.coli, and enterobacter are present. Talked with Dr. Vianne Bulls, changed Vancomycin to Zyvox 600mg  IV q12h and ordered ID consult.  Temp (24hrs), Avg:98.4 F (36.9 C), Min:98.4 F (36.9 C), Max:98.4 F (36.9 C)   Recent Labs Lab 06/05/15 0253 06/06/15 0514 06/07/15 0507 06/09/15 2207 06/10/15 0121  WBC 7.0 8.0 7.9 23.3* 20.0*    Recent Labs Lab 06/04/15 0403 06/05/15 0253 06/09/15 2207 06/10/15 0121  CREATININE 1.02* 0.93 1.10* 1.01*   Estimated Creatinine Clearance: 40.3 mL/min (by C-G formula based on Cr of 1.01).    Allergies  Allergen Reactions  . Contrast Media [Iodinated Diagnostic Agents]     Kidneys are operational at 28% and wont use dyes    Antimicrobials this admission: Vancomycin 06/09/15 >> 06/10/15 Zosyn 06/09/15>>   Zyvox 06/10/15 >>   Thank you for allowing pharmacy to be a part of this patient's care.  Paulina Fusi, PharmD, BCPS 06/10/2015 4:39 PM

## 2015-06-10 NOTE — ED Notes (Signed)
Patient transported to X-ray 

## 2015-06-10 NOTE — Progress Notes (Signed)
NG tube placed after several unsuccessful attempts, NG coiled in mouth and esophageus, no emesis return from tube, xray placement reveled tube coilied, re inserted, xray placement verified in stomach ok to use, coffee ground red tinged return in cannister, will only drain when laid down, Spoke to Dr sudini, ok to use tube, make patient strict NPO, place on off unit tele and protonix drip will consult gastro, MD to bedside to see patient, Dr sudini saw patient vomit in ER said that at that time color was bile green, now with definite blood in emesis. Orders placed by md.

## 2015-06-10 NOTE — Progress Notes (Addendum)
[place] [facility] [place] NAME: Shelley Fields    MR#:  [id number]  DATE OF BIRTH:  01-Jun-1936  SUBJECTIVE: Admitted for septic shock secondary to sacral decubiti. Discharge  recently. And lethargic. Has coffee-ground vomiting and also looks like she has abdominal pain, she is moaning when I palpated the abdomen. Afebrile.   CHIEF COMPLAINT:   Chief Complaint  Patient presents with  . Altered Mental Status    REVIEW OF SYSTEMS:   Review of Systems  Unable to perform ROS: medical condition  Musculoskeletal: Positive for neck pain.   coffee-ground stuff noted from NG tube lavage.   DRUG ALLERGIES:   Allergies  Allergen Reactions  . Contrast Media [Iodinated Diagnostic Agents]     Kidneys are operational at 28% and wont use dyes    VITALS:  Blood pressure 157/54, pulse 110, temperature 98.4 F (36.9 C), temperature source Oral, resp. rate 18, weight 68.402 kg (150 lb 12.8 oz), SpO2 99 %.  PHYSICAL EXAMINATION:  GENERAL:  80 y.o.-year-old patient lying in the bed , allergic.  EYES: Pupils equal, round, reactive to light and accommodation. No scleral icterus. Extraocular muscles intact.  HEENT: Head atraumatic, normocephalic. Oropharynx and nasopharynx clear.  NECK:  Supple, no jugular venous distention. No thyroid enlargement, no tenderness.  LUNGS: Normal breath sounds bilaterally, no wheezing, rales,rhonchi or crepitation. No use of accessory muscles of respiration.  CARDIOVASCULAR: S1, S2 normal. No murmurs, rubs, or gallops.  ABDOMEN: Generalized abdominal tenderness present. No rebound tenderness present EXTREMITIES: Right BKA site is intact and clean . NEUROLOGIC: Unable to perform neurological exam because she is lethargic.Marland Kitchen PSYCHIATRIC: The patient is lethargic.  SKIN: No obvious rash, lesion, or ulcer.    LABORATORY PANEL:   CBC  Recent Labs Lab 06/10/15 0121  WBC 20.0*  HGB 9.9*  HCT 31.4*  PLT 515*    ------------------------------------------------------------------------------------------------------------------  Chemistries   Recent Labs Lab 06/09/15 2207 06/10/15 0121  NA 144 142  K 4.5 4.2  CL 109 110  CO2 20* 17*  GLUCOSE 265* 352*  BUN 27* 27*  CREATININE 1.10* 1.01*  CALCIUM 8.8* 8.2*  AST 308*  --   ALT 126*  --   ALKPHOS 47  --   BILITOT 0.8  --    ------------------------------------------------------------------------------------------------------------------  Cardiac Enzymes No results for input(s): TROPONINI in the last 168 hours. ------------------------------------------------------------------------------------------------------------------  RADIOLOGY:  Ct Abdomen Pelvis Wo Contrast  06/10/2015  CLINICAL DATA:  Acute onset of vomiting. Sepsis. Suggestion of mild hematemesis. Diffuse abdominal pain and elevated lactic acid. Initial encounter. EXAM: CT ABDOMEN AND PELVIS WITHOUT CONTRAST TECHNIQUE: Multidetector CT imaging of the abdomen and pelvis was performed following the standard protocol without IV contrast. COMPARISON:  CT of the abdomen and pelvis from 11/21/2014 FINDINGS: Trace bilateral pleural effusions are noted, with mild atelectasis. A 1.1 cm nodule is noted at the right lower lobe (image 1 of 18). Scattered coronary artery calcifications are seen. Trace pericardial fluid likely remains within normal limits. An enteric tube is noted extending to the body of the stomach. The liver and spleen are unremarkable in appearance. Tiny stones are noted dependently in the gallbladder. The gallbladder is otherwise unremarkable. The pancreas and adrenal glands are unremarkable. Nonspecific perinephric stranding is noted bilaterally. Mild left renal scarring is noted. There is no evidence of hydronephrosis. No renal or ureteral stones are identified. Scattered vascular calcifications are noted at the renal hila bilaterally. Masses about the retroperitoneum have  increased in size,  the largest of which appears to partially extend into the inferior vena cava, measuring approximately 5.9 x 4.5 cm. This extends distal to the aortic bifurcation. Enlarged mesenteric nodes measure up to 1.8 cm in short axis. The small bowel is unremarkable in appearance. The stomach is within normal limits. No acute vascular abnormalities are seen. Scattered calcification is noted along the celiac trunk, superior mesenteric artery and at the origins of the renal arteries bilaterally. The appendix is normal in caliber, without evidence of appendicitis. The colon is grossly unremarkable in appearance. The bladder is decompressed, with a Foley catheter in place. A small amount of free fluid is seen within the pelvis. The patient is status post hysterectomy. No suspicious adnexal masses are seen. No inguinal lymphadenopathy is seen. No acute osseous abnormalities are identified. IMPRESSION: 1. Masses about the retroperitoneum have increased in size, the largest of which appears to partially extend into the inferior vena cava, measuring 5.9 x 4.5 cm. This extends distal to the aortic bifurcation. Enlarged mesenteric nodes measure up to 1.8 cm in short axis. 2. 1.1 cm nodule at the right lower lobe is nonspecific, but could reflect metastatic disease. 3. Trace bilateral pleural effusions, with mild atelectasis. 4. Scattered coronary artery calcifications seen. 5. Cholelithiasis; gallbladder otherwise unremarkable. 6. Mild left renal scarring noted. 7. Scattered scattered calcification along the celiac trunk, superior mesenteric artery, and at the origins of the renal arteries bilaterally. Electronically Signed   By: Garald Balding M.D.   On: 06/10/2015 06:37   Dg Abd 1 View  06/10/2015  CLINICAL DATA:  Enteric tube placement.  Initial encounter. EXAM: ABDOMEN - 1 VIEW COMPARISON:  Abdominal radiograph performed earlier today at 3:19 a.m. FINDINGS: The patient's enteric tube is noted ending overlying  the body of the stomach. The visualized bowel gas pattern is grossly unremarkable. No free intra-abdominal air is seen, though evaluation for free air is limited on a single supine view. No acute osseous abnormalities are identified. Mild degenerative change is noted along the lower thoracic spine. Vascular stents are seen overlying the upper pelvis. IMPRESSION: Enteric tube noted ending overlying the body of the stomach. Electronically Signed   By: Garald Balding M.D.   On: 06/10/2015 04:05   Dg Abd 1 View  06/10/2015  CLINICAL DATA:  Nasogastric tube placement.  Initial encounter. EXAM: ABDOMEN - 1 VIEW COMPARISON:  Abdominal radiograph performed earlier today at 12:22 a.m. FINDINGS: The patient's enteric tube is noted coiling at the distal esophagus. The visualized bowel gas pattern is grossly unremarkable. A moderate amount of air is seen in the stomach. No free intra-abdominal air is seen, though evaluation for free air is limited on a single supine view. Mild degenerative change is noted along the lower thoracic spine. Vascular stents are noted overlying the upper pelvis. IMPRESSION: 1. Enteric tube noted coiling at the distal esophagus. This was subsequently repositioned. 2. Unremarkable bowel gas pattern; no free intra-abdominal air seen. Electronically Signed   By: Garald Balding M.D.   On: 06/10/2015 04:02   Dg Abd 1 View  06/10/2015  CLINICAL DATA:  Vomiting. EXAM: ABDOMEN - 1 VIEW COMPARISON:  Most recent CT 11/21/2014 FINDINGS: Air within the stomach without gastric distention. No small bowel dilatation. Small volume of stool throughout the colon without colonic dilatation. No evidence of free air on supine views. Extensive atherosclerotic calcification of the abdominal vasculature with vascular stents in the pelvis. Splenic granuloma are noted. IMPRESSION: Nonobstructive bowel gas pattern. Air in the stomach without gastric  distension. Electronically Signed   By: Jeb Levering M.D.   On:  06/10/2015 01:11   Dg Chest Port 1 View  06/09/2015  CLINICAL DATA:  Acute onset of altered mental status. Initial encounter. EXAM: PORTABLE CHEST 1 VIEW COMPARISON:  Chest radiograph performed 05/16/2015 FINDINGS: The lungs are well-aerated. A 1.9 cm nodule is again noted at the right midlung zone, concerning for metastatic disease given the patient's history. There is no evidence of pleural effusion or pneumothorax. The cardiomediastinal silhouette is borderline normal in size. No acute osseous abnormalities are seen. A right-sided chest port is noted ending about the distal SVC. IMPRESSION: 1.9 cm nodule again noted at the right midlung zone, concerning for metastatic disease given the patient's history. Lungs otherwise grossly clear. Electronically Signed   By: Garald Balding M.D.   On: 06/09/2015 22:57    EKG:   Orders placed or performed during the hospital encounter of 06/09/15  . EKG 12-Lead  . EKG 12-Lead    ASSESSMENT AND PLAN:   #1. sepsis secondary to sacral decubiti: Continue vancomycin, Zosyn, consult surgery for decubiti debridement. Follow blood cultures, continue IV hydration. 2. GI bleed with abdominal pain: Continue PPIs, IV hydration, NG tube to LIS,appreciate GI consult, as she is to keep hemoglobin more than 9. Hold aspirin, lovenox. #3 metabolic encephalopathy secondary to sepsis, GI bleed.continue Protonix drip. #4 history of recent gangrene of the right foot status post BKA. #5 DMII; as patient is nothing by mouth we will decrease the NovoLog correction to  sensitive scale. And continue basal dose of Levemir. #6 hypertension; and troponin. Continue on Norvasc. #7 history of PVD with right femoral endarterectomy, right leg gangrene status post BKA. #8. History of endometrial cancer in 2012 with hysterectomy, metastatic disease to pelvis, lymph nodes status post chemotherapy, surgery abdominal CAT scan showed worsening of lymphnodes ,and alwso mets to lungs this time.  Consult oncology. Reviewed old records in care everywhere,took 20 min in that,  All the records are reviewed and case discussed with Care Management/Social Workerr. Management plans discussed with the patient, family and they are in agreement.  CODE STATUS: DNR  TOTAL TIME TAKING CARE OF THIS PATIENT: 35 minutes. (CCT)  POSSIBLE D/C IN  4-5 DAYS, DEPENDING ON CLINICAL CONDITION.   Epifanio Lesches M.D on 06/10/2015 at 11:09 AM  Between 7am to 6pm - Pager - [phone]  After 6pm go to [email or web address] - password [email or web address] [facility]  Office  [phone]  CC: Primary care physician; [name], MD   Note: This dictation was prepared with Dragon dictation along with smaller phrase technology. Any transcriptional errors that result from this process are unintentional.

## 2015-06-10 NOTE — Progress Notes (Signed)
ANTIBIOTIC CONSULT NOTE - INITIAL  Pharmacy Consult for vancomycin and Zosyn dosing Indication: wound infection  Allergies  Allergen Reactions  . Contrast Media [Iodinated Diagnostic Agents]     Kidneys are operational at 28% and wont use dyes    Patient Measurements:   Adjusted Body Weight: 54kg  Vital Signs: BP: 176/53 mmHg (01/01 2330) Pulse Rate: 101 (01/01 2330) Intake/Output from previous day:   Intake/Output from this shift:    Labs:  Recent Labs  06/07/15 0507 06/09/15 2207  WBC 7.9 23.3*  HGB 9.0* 10.3*  PLT 426 583*  CREATININE  --  1.10*   Estimated Creatinine Clearance: 36.5 mL/min (by C-G formula based on Cr of 1.1). No results for input(s): VANCOTROUGH, VANCOPEAK, VANCORANDOM, GENTTROUGH, GENTPEAK, GENTRANDOM, TOBRATROUGH, TOBRAPEAK, TOBRARND, AMIKACINPEAK, AMIKACINTROU, AMIKACIN in the last 72 hours.   Microbiology: Recent Results (from the past 720 hour(s))  Blood culture (routine x 2)     Status: None   Collection Time: 05/15/15  4:11 PM  Result Value Ref Range Status   Specimen Description BLOOD RIGHT WRIST  Final   Special Requests   Final    BOTTLES DRAWN AEROBIC AND ANAEROBIC 1CC AEROBIC,1CCANA   Culture NO GROWTH 5 DAYS  Final   Report Status 05/20/2015 FINAL  Final  Blood culture (routine x 2)     Status: None   Collection Time: 05/15/15  4:20 PM  Result Value Ref Range Status   Specimen Description BLOOD LEFT HAND  Final   Special Requests BOTTLES DRAWN AEROBIC AND ANAEROBIC 5CC  Final   Culture NO GROWTH 5 DAYS  Final   Report Status 05/20/2015 FINAL  Final  MRSA PCR Screening     Status: Abnormal   Collection Time: 05/15/15  8:57 PM  Result Value Ref Range Status   MRSA by PCR POSITIVE (A) NEGATIVE Final    Comment: READ BACK AND VERIFIED BY CASSIE STEWART @0028  05/16/15.Marland KitchenMarland KitchenAJO        The GeneXpert MRSA Assay (FDA approved for NASAL specimens only), is one component of a comprehensive MRSA colonization surveillance program. It is  not intended to diagnose MRSA infection nor to guide or monitor treatment for MRSA infections.   Urine culture     Status: None   Collection Time: 05/23/15 10:05 PM  Result Value Ref Range Status   Specimen Description URINE, CLEAN CATCH  Final   Special Requests NONE  Final   Culture NO GROWTH 2 DAYS  Final   Report Status 05/26/2015 FINAL  Final    Medical History: Past Medical History  Diagnosis Date  . Diabetes mellitus without complication ([place])   . C. difficile diarrhea   . Uterine cancer ([place])   . Hypogammaglobulinemia ([place])   . Pulmonary embolism ([place])   . Decubitus ulcers   . Hypertension   . Hypercholesterolemia   . Incontinence of urine   . Cancer [facility])     Endometrial Cancer  . Cervical cancer ([place])     with radiation 45 years ago...  . Arthritis   . GERD (gastroesophageal reflux disease)   . Bladder cancer ([place])   . Decubitus ulcer of buttock, stage 1   . Peripheral vascular disease ([place])   . Anemia   . Coronary artery disease   . CHF (congestive heart failure) ([place])   . Shortness of breath dyspnea   . Hypoxia   . Gangrene of foot ([place])     right  . Myocardial infarction [facility])     nstemi 12/16  .  Chronic kidney disease     acute on chronic renal failure 12/16  . Dementia   . Diabetes ([place])     Medications:   Assessment: Blood and urine cx pending UA: LE(tr) NO2(-) WBC 6-30 CXR: lungs clear  Goal of Therapy:  Vancomycin trough level 15-20 mcg/ml  Plan:  TBW 66.7kg  IBW 45.5kg  DW 54kg  Vd 38L ke 0.035 hr-1  t1/2 20 hours Vancomycin 750 mg q 24 hours ordered. Level before 5th dose.  Zosyn 4.5 grams ordered for Pseudomonas risk of recent hospitalization with abx received.  Jenesis Martin S 06/10/2015,12:37 AM

## 2015-06-10 NOTE — Progress Notes (Signed)
Inpatient Diabetes Program Recommendations  AACE/ADA: New Consensus Statement on Inpatient Glycemic Control (2015)  Target Ranges:  Prepandial:   less than 140 mg/dL      Peak postprandial:   less than 180 mg/dL (1-2 hours)      Critically ill patients:  140 - 180 mg/dL  Results for MAKEL, HALLIGAN (MRN [id number]) as of 06/10/2015 11:01  Ref. Range 06/10/2015 01:23 06/10/2015 07:08  Glucose-Capillary Latest Ref Range: 65-99 mg/dL 330 (H) 274 (H)   Review of Glycemic Control  Outpatient Diabetes medications: Levemir 15 units QHS Current orders for Inpatient glycemic control: Levemir 15 units QHS (to start today), Novolog 0-15 units TID with meals, Novolog 0-5 units HS  Inpatient Diabetes Program Recommendations: Correction (SSI): Please consider decreasing Novolog correction to sensitive scale and change frequency to Q4H since patient is NPO.  Thanks, Barnie Alderman, RN, MSN, CDE Diabetes Coordinator Inpatient Diabetes Program [phone] (Team Pager from [place] to [place]) [phone] ([facility] office) [phone] [facility] office) [phone] [facility] office)

## 2015-06-10 NOTE — Progress Notes (Signed)
Ng tube in place, no return, placement verified by three rn's at bedside, considering patient is still vomiting coffee ground emesis would liek xray verification.  Also lactic acid elevated critical hihg and rising , md to place orders for both

## 2015-06-10 NOTE — Consult Note (Signed)
Pt known to [place] from consult last week for rectal bleeding likely due to passage of large hard stool.  Patient presented with vomiting and NG inserted with minor difficulty.  CT abd showed large retroperitoneal masses, CXR possible lung cancer, recent lower extremity amputation due to gangrene.  GI consulted for vomiting and some degree of blood which could be traumatic.  She is demented.   She has DNR status.    Abd has few bowel sounds that I can barely hear due to use of isolation type stethoscope.  She has diffuse tenderness to deep palpation but not to gentle finger percussion.  Abd is not distended, no palpable masses noted.  Old scar in suprapubic area. Will do rectal exam later today  A:  Multiple medical problems too numerous to list here but noted in the chart.  No recommendations for any elective GI procedures.  I recommend continue PPI for now.  Would use NG suction for 48 hours then clamp tube and leave for 48 hours and see if any accumulation of gastric fluid occurs.  Small mass in lung and retroperitoneal enlarge masses suggest metastatic cancer.  Consider Hospice consult.  Consider oncology consult.

## 2015-06-11 LAB — COMPREHENSIVE METABOLIC PANEL
ALBUMIN: 1.6 g/dL — AB (ref 3.5–5.0)
ALT: 133 U/L — ABNORMAL HIGH (ref 14–54)
ANION GAP: 5 (ref 5–15)
AST: 124 U/L — ABNORMAL HIGH (ref 15–41)
Alkaline Phosphatase: 47 U/L (ref 38–126)
BILIRUBIN TOTAL: 0.3 mg/dL (ref 0.3–1.2)
BUN: 22 mg/dL — ABNORMAL HIGH (ref 6–20)
CHLORIDE: 120 mmol/L — AB (ref 101–111)
CO2: 19 mmol/L — ABNORMAL LOW (ref 22–32)
CREATININE: 0.86 mg/dL (ref 0.44–1.00)
Calcium: 7.6 mg/dL — ABNORMAL LOW (ref 8.9–10.3)
GFR calc non Af Amer: >60 mL/min (ref >60)
Glucose, Bld: 105 mg/dL — ABNORMAL HIGH (ref 65–99)
POTASSIUM: 3.2 mmol/L — AB (ref 3.5–5.1)
Sodium: 144 mmol/L (ref 135–145)
Total Protein: 5.3 g/dL — ABNORMAL LOW (ref 6.5–8.1)

## 2015-06-11 LAB — GLUCOSE, CAPILLARY
GLUCOSE-CAPILLARY: 133 mg/dL — AB (ref 65–99)
GLUCOSE-CAPILLARY: 137 mg/dL — AB (ref 65–99)
Glucose-Capillary: 93 mg/dL (ref 65–99)

## 2015-06-11 LAB — MAGNESIUM: MAGNESIUM: 1.5 mg/dL — AB (ref 1.7–2.4)

## 2015-06-11 MED ORDER — HYDROMORPHONE HCL 1 MG/ML IJ SOLN: 0.5000 mg | INTRAMUSCULAR | Status: DC | PRN

## 2015-06-11 MED ORDER — BISACODYL 10 MG RE SUPP: 10.0000 mg | Freq: Every day | RECTAL | Status: DC | PRN

## 2015-06-11 MED ORDER — DILTIAZEM HCL 30 MG PO TABS: 30.0000 mg | ORAL_TABLET | Freq: Four times a day (QID) | ORAL | Status: DC

## 2015-06-11 MED ORDER — PROCHLORPERAZINE 25 MG RE SUPP: 25.0000 mg | Freq: Three times a day (TID) | RECTAL | Status: DC | PRN

## 2015-06-11 MED ORDER — MORPHINE SULFATE (PF) 2 MG/ML IV SOLN: 2.0000 mg | INTRAVENOUS | Status: DC | PRN

## 2015-06-11 MED ORDER — LORAZEPAM 2 MG/ML IJ SOLN: 0.5000 mg | INTRAMUSCULAR | Status: DC | PRN

## 2015-06-11 MED ORDER — FUROSEMIDE 10 MG/ML IJ SOLN
40.0000 mg | Freq: Once | INTRAMUSCULAR | Status: AC
  Administered 2015-06-11: 40 mg via INTRAVENOUS
  Filled 2015-06-11: qty 4

## 2015-06-11 MED ORDER — HYDROMORPHONE HCL 1 MG/ML IJ SOLN
1.0000 mg | INTRAMUSCULAR | Status: DC | PRN
  Administered 2015-06-12 (×2): 1 mg via INTRAVENOUS
  Filled 2015-06-11 (×2): qty 1

## 2015-06-11 MED ORDER — HYDROMORPHONE HCL 1 MG/ML IJ SOLN
1.0000 mg | INTRAMUSCULAR | Status: DC | PRN
  Administered 2015-06-11 (×3): 1 mg via INTRAVENOUS
  Filled 2015-06-11 (×3): qty 1

## 2015-06-11 MED ORDER — MORPHINE SULFATE (PF) 2 MG/ML IV SOLN
2.0000 mg | INTRAVENOUS | Status: DC | PRN
  Administered 2015-06-11: 06:00:00 2 mg via INTRAVENOUS
  Filled 2015-06-11: qty 1

## 2015-06-11 NOTE — Progress Notes (Signed)
Notifed MD that IV fluids are ordered at 100 cc/ hour and patient has had minimal output through foley, bladder scan yielded 0, bp is elevated after labetalol administration, Per MD give 40 mg lasix IV once, telephone via [place].

## 2015-06-11 NOTE — Clinical Social Work Note (Signed)
Clinical Social Worker notified that pt was from [facility]. Palliative Care is following. Full assessment to follow. CSW will continue to follow.   Darden Dates, MSW, LCSW Clinical Social Worker [phone]

## 2015-06-11 NOTE — Consult Note (Signed)
[facility]ious [facility]     Reason for Consult:sepsis, bacteremia, Sacral decub    Referring Physician: Herb Grays  Date of Admission:  06/09/2015   Active Problems:   Sepsis ([place])   Sacral decubitus ulcer   Acute encephalopathy   HPI: Shelley Fields is a 80 y.o. female with stage IV endometrial cancer admitted with sepsis from large unstagable sacral decub.  Has polymicrobial bacteremia   Pt is lethargic and in obvious pain.   Past Medical History  Diagnosis Date  . Diabetes mellitus without complication ([place])   . C. difficile diarrhea   . Uterine cancer ([place])   . Hypogammaglobulinemia ([place])   . Pulmonary embolism ([place])   . Decubitus ulcers   . Hypertension   . Hypercholesterolemia   . Incontinence of urine   . Cancer [facility])     Endometrial Cancer  . Cervical cancer ([place])     with radiation 45 years ago...  . Arthritis   . GERD (gastroesophageal reflux disease)   . Bladder cancer ([place])   . Decubitus ulcer of buttock, stage 1   . Peripheral vascular disease ([place])   . Anemia   . Coronary artery disease   . CHF (congestive heart failure) ([place])   . Shortness of breath dyspnea   . Hypoxia   . Gangrene of foot ([place])     right  . Myocardial infarction [facility])     nstemi 12/16  . Chronic kidney disease     acute on chronic renal failure 12/16  . Dementia   . Diabetes [facility])    Past Surgical History  Procedure Laterality Date  . Total abdominal hysterectomy w/ bilateral salpingoophorectomy  07/29/2010  . Stent in leg  2016  . Cataract extraction, bilateral    . Bladder surgery    . Femoral endarterectomy      right  . Leg surgery      right leg  . Portacath placement      right  . Esophagogastroduodenoscopy N/A 12/14/2014    Procedure: ESOPHAGOGASTRODUODENOSCOPY (EGD);  Surgeon: Hulen Luster, MD;  Location[facility] ENDOSCOPY;  Service: Endoscopy;  Laterality: N/A;  . Peripheral vascular catheterization Right 04/05/2015    Procedure: Lower Extremity Angiography;   Surgeon: Katha Cabal, MD;  Location: [place] CV LAB;  Service: Cardiovascular;  Laterality: Right;  . Peripheral vascular catheterization  04/05/2015    Procedure: Lower Extremity Intervention;  Surgeon: Katha Cabal, MD;  Location: [place] CV LAB;  Service: Cardiovascular;;  . Peripheral vascular catheterization Right 05/14/2015    Procedure: Lower Extremity Angiography;  Surgeon: Katha Cabal, MD;  Location: [place] CV LAB;  Service: Cardiovascular;  Laterality: Right;  . Peripheral vascular catheterization Right 05/14/2015    Procedure: Lower Extremity Intervention;  Surgeon: Katha Cabal, MD;  Location: [place] CV LAB;  Service: Cardiovascular;  Laterality: Right;  . Angioplasty    . Amputation Right 05/29/2015    Procedure: AMPUTATION BELOW KNEE;  Surgeon: Katha Cabal, MD;  Location: [facility] ORS;  Service: Vascular;  Laterality: Right;   Social History  Substance Use Topics  . Smoking status: Never Smoker   . Smokeless tobacco: None  . Alcohol Use: No   Family History  Problem Relation Age of Onset  . Hypertension Mother   . Diabetes Mellitus II Mother   . Coronary artery disease Mother   . Hypertension Father   . Diabetes Mellitus II Father   . CAD Father   .  Diabetes Mellitus II Sister   . Hypertension Sister   . Diabetes Mellitus II Brother   . Hypertension Brother   . Cancer Brother     Allergies:  Allergies  Allergen Reactions  . Contrast Media [Iodinated Diagnostic Agents]     Kidneys are operational at 28% and wont use dyes    Current antibiotics: Antibiotics Given (last 72 hours)    Date/Time Action Medication Dose Rate   06/10/15 0616 Given   piperacillin-tazobactam (ZOSYN) IVPB 4.5 g 4.5 g 25 mL/hr   06/10/15 1548 Given   piperacillin-tazobactam (ZOSYN) IVPB 3.375 g 3.375 g 12.5 mL/hr   06/10/15 1548 Given   linezolid (ZYVOX) IVPB 600 mg 600 mg 300 mL/hr   06/10/15 2229 Given   piperacillin-tazobactam  (ZOSYN) IVPB 3.375 g 3.375 g 12.5 mL/hr   06/11/15 0326 Given   linezolid (ZYVOX) IVPB 600 mg 600 mg 300 mL/hr   06/11/15 0854 Given   piperacillin-tazobactam (ZOSYN) IVPB 3.375 g 3.375 g 12.5 mL/hr   06/11/15 1349 Given   linezolid (ZYVOX) IVPB 600 mg 600 mg 300 mL/hr   06/11/15 1505 Given   piperacillin-tazobactam (ZOSYN) IVPB 3.375 g 3.375 g 12.5 mL/hr      MEDICATIONS: . diltiazem  30 mg Oral 4 times per day  . docusate sodium  100 mg Oral BID  . furosemide  40 mg Intravenous Once  . insulin aspart  0-9 Units Subcutaneous TID WC  . insulin detemir  15 Units Subcutaneous QHS  . linezolid (ZYVOX) IV  600 mg Intravenous Q12H  . magnesium oxide  400 mg Oral Daily  . megestrol  200 mg Oral Daily  . metoprolol tartrate  25 mg Oral BID  . mirtazapine  15 mg Oral QHS  . [START ON 06/13/2015] pantoprazole (PROTONIX) IV  40 mg Intravenous Q12H  . piperacillin-tazobactam (ZOSYN)  IV  3.375 g Intravenous 3 times per day  . risperiDONE  0.25 mg Oral QHS  . sertraline  25 mg Oral Daily  . tamsulosin  0.4 mg Oral BID    Review of Systems - 11 systems reviewed and negative per HPI   OBJECTIVE: Temp:  [98.2 F (36.8 C)-99.2 F (37.3 C)] 98.6 F (37 C) (01/03 1418) Pulse Rate:  [87-113] 87 (01/03 1606) Resp:  [20-21] 21 (01/03 1418) BP: (164-190)/(34-97) 164/54 mmHg (01/03 1606) SpO2:  [97 %-100 %] 100 % (01/03 1418) Weight:  [70.716 kg (155 lb 14.4 oz)] 70.716 kg (155 lb 14.4 oz) (01/03 0513) Physical Exam  Constitutional:  lethargic HENT: [place]/AT, Mouth/Throat: Oropharynx is dry Cardiovascular: Normal rate, Pulmonary/Chest: Effort normal and breath sounds normal. No respiratory distress.  has no wheezes.  Neck = supple, no nuchal rigidity Abdominal: distended, tender Lymphadenopathy: no cervical adenopathy. No axillary adenopathy Neurological:lethargic Skin large necrotic appearing sacral decub   LABS: Results for orders placed or performed during the hospital encounter of  06/09/15 (from the past 48 hour(s))  Comprehensive metabolic panel     Status: Abnormal   Collection Time: 06/09/15 10:07 PM  Result Value Ref Range   Sodium 144 135 - 145 mmol/L   Potassium 4.5 3.5 - 5.1 mmol/L   Chloride 109 101 - 111 mmol/L   CO2 20 (L) 22 - 32 mmol/L   Glucose, Bld 265 (H) 65 - 99 mg/dL   BUN 27 (H) 6 - 20 mg/dL   Creatinine, Ser 1.10 (H) 0.44 - 1.00 mg/dL   Calcium 8.8 (L) 8.9 - 10.3 mg/dL   Total Protein 7.3 6.5 -  8.1 g/dL   Albumin 2.4 (L) 3.5 - 5.0 g/dL   AST 308 (H) 15 - 41 U/L   ALT 126 (H) 14 - 54 U/L   Alkaline Phosphatase 47 38 - 126 U/L   Total Bilirubin 0.8 0.3 - 1.2 mg/dL   GFR calc non Af Amer 47 (L) >60 mL/min   GFR calc Af Amer 55 (L) >60 mL/min    Comment: (NOTE) The eGFR has been calculated using the CKD EPI equation. This calculation has not been validated in all clinical situations. eGFR's persistently <60 mL/min signify possible Chronic Kidney Disease.    Anion gap 15 5 - 15  CBC with Differential     Status: Abnormal   Collection Time: 06/09/15 10:07 PM  Result Value Ref Range   WBC 23.3 (H) 3.6 - 11.0 K/uL   RBC 3.95 3.80 - 5.20 MIL/uL   Hemoglobin 10.3 (L) 12.0 - 16.0 g/dL   HCT 32.3 (L) 35.0 - 47.0 %   MCV 81.9 80.0 - 100.0 fL   MCH 26.0 26.0 - 34.0 pg   MCHC 31.8 (L) 32.0 - 36.0 g/dL   RDW 17.5 (H) 11.5 - 14.5 %   Platelets 583 (H) 150 - 440 K/uL   Neutrophils Relative % 94 %   Neutro Abs 21.9 (H) 1.4 - 6.5 K/uL   Lymphocytes Relative 4 %   Lymphs Abs 1.0 1.0 - 3.6 K/uL   Monocytes Relative 2 %   Monocytes Absolute 0.5 0.2 - 0.9 K/uL   Eosinophils Relative 0 %   Eosinophils Absolute 0.0 0 - 0.7 K/uL   Basophils Relative 0 %   Basophils Absolute 0.0 0 - 0.1 K/uL  Culture, blood (routine x 2)     Status: None (Preliminary result)   Collection Time: 06/09/15 10:07 PM  Result Value Ref Range   Specimen Description BLOOD LEFT ASSIST CONTROL    Special Requests BOTTLES DRAWN AEROBIC AND ANAEROBIC 4CC    Culture  Setup Time       GRAM NEGATIVE RODS GRAM POSITIVE COCCI IN BOTH AEROBIC AND ANAEROBIC BOTTLES CRITICAL RESULT CALLED TO, READ BACK BY AND VERIFIED WITH: HENRY ZOMPA 06/10/15 1125AM MLM    Culture      ENTEROCOCCUS SPECIES ESCHERICHIA COLI PROTEUS SPECIES IN BOTH AEROBIC AND ANAEROBIC BOTTLES    Report Status PENDING   Blood Culture ID Panel (Reflexed)     Status: Abnormal   Collection Time: 06/09/15 10:07 PM  Result Value Ref Range   Enterococcus species DETECTED (A) NOT DETECTED   Listeria monocytogenes NOT DETECTED NOT DETECTED   Staphylococcus species NOT DETECTED NOT DETECTED   Staphylococcus aureus NOT DETECTED NOT DETECTED   Streptococcus species NOT DETECTED NOT DETECTED   Streptococcus agalactiae NOT DETECTED NOT DETECTED   Streptococcus pneumoniae NOT DETECTED NOT DETECTED   Streptococcus pyogenes NOT DETECTED NOT DETECTED   Acinetobacter baumannii NOT DETECTED NOT DETECTED   Enterobacteriaceae species DETECTED (A) NOT DETECTED   Enterobacter cloacae complex NOT DETECTED NOT DETECTED   Escherichia coli DETECTED (A) NOT DETECTED   Klebsiella oxytoca NOT DETECTED NOT DETECTED   Klebsiella pneumoniae NOT DETECTED NOT DETECTED   Proteus species DETECTED (A) NOT DETECTED   Serratia marcescens NOT DETECTED NOT DETECTED   Haemophilus influenzae NOT DETECTED NOT DETECTED   Neisseria meningitidis NOT DETECTED NOT DETECTED   Pseudomonas aeruginosa NOT DETECTED NOT DETECTED   Candida albicans NOT DETECTED NOT DETECTED   Candida glabrata NOT DETECTED NOT DETECTED   Candida krusei NOT DETECTED  NOT DETECTED   Candida parapsilosis NOT DETECTED NOT DETECTED   Candida tropicalis NOT DETECTED NOT DETECTED   Carbapenem resistance NOT DETECTED NOT DETECTED   Methicillin resistance NOT DETECTED NOT DETECTED   Vancomycin resistance DETECTED (A) NOT DETECTED    Comment: CRITICAL RESULT CALLED TO, READ BACK BY AND VERIFIED WITHSandra Cockayne [id number] 06/10/15 DETECTED: VANA/B, ENTEROCOCCUS SP,  ENTEROBACTERIACEAE, E.COLI, PROTEUS SP MLM   Culture, blood (routine x 2)     Status: None (Preliminary result)   Collection Time: 06/09/15 10:08 PM  Result Value Ref Range   Specimen Description BLOOD LEFT ARM    Special Requests BOTTLES DRAWN AEROBIC AND ANAEROBIC 4CC    Culture  Setup Time      GRAM NEGATIVE RODS IN BOTH AEROBIC AND ANAEROBIC BOTTLES GRAM POSITIVE COCCI ANAEROBIC BOTTLE ONLY CRITICAL RESULT CALLED TO, READ BACK BY AND VERIFIED WITH: HENRY ZOMPA 06/10/15 1240PM MLM    Culture      GRAM POSITIVE COCCI ANAEROBIC BOTTLE ONLY GRAM NEGATIVE RODS IN BOTH AEROBIC AND ANAEROBIC BOTTLES GRAM NEGATIVE RODS IDENTIFICATION AND SUSCEPTIBILITIES TO FOLLOW ONCE ISOLATED    Report Status PENDING   Urine culture     Status: None (Preliminary result)   Collection Time: 06/09/15 10:08 PM  Result Value Ref Range   Specimen Description URINE, RANDOM    Special Requests NONE    Culture      >=100,000 COLONIES/mL GAMMA HEMOLYSIS IDENTIFICATION AND SUSCEPTIBILITIES TO FOLLOW    Report Status PENDING   Lactic acid, plasma     Status: Abnormal   Collection Time: 06/09/15 10:08 PM  Result Value Ref Range   Lactic Acid, Venous 3.3 (HH) 0.5 - 2.0 mmol/L    Comment: CRITICAL RESULT CALLED TO, READ BACK BY AND VERIFIED WITH Kailei Cowens WALKER ON 06/09/15 AT [address] [facility]   Urinalysis complete, with microscopic ([place] only)     Status: Abnormal   Collection Time: 06/09/15 10:08 PM  Result Value Ref Range   Color, Urine YELLOW (A) YELLOW   APPearance CLEAR (A) CLEAR   Glucose, UA 150 (A) NEGATIVE mg/dL   Bilirubin Urine NEGATIVE NEGATIVE   Ketones, ur 1+ (A) NEGATIVE mg/dL   Specific Gravity, Urine 1.019 1.005 - 1.030   Hgb urine dipstick 2+ (A) NEGATIVE   pH 5.0 5.0 - 8.0   Protein, ur >500 (A) NEGATIVE mg/dL   Nitrite NEGATIVE NEGATIVE   Leukocytes, UA TRACE (A) NEGATIVE   RBC / HPF 6-30 0 - 5 RBC/hpf   WBC, UA 6-30 0 - 5 WBC/hpf   Bacteria, UA RARE (A) NONE SEEN   Squamous Epithelial /  LPF NONE SEEN NONE SEEN   Mucous PRESENT    Hyaline Casts, UA PRESENT   Lactic acid, plasma     Status: Abnormal   Collection Time: 06/10/15  1:21 AM  Result Value Ref Range   Lactic Acid, Venous 3.4 (HH) 0.5 - 2.0 mmol/L    Comment: CRITICAL RESULT CALLED TO, READ BACK BY AND VERIFIED WITH SARAH KLENNER ON 06/09/14 AT [address] [facility]   CBC with Differential/Platelet     Status: Abnormal   Collection Time: 06/10/15  1:21 AM  Result Value Ref Range   WBC 20.0 (H) 3.6 - 11.0 K/uL   RBC 3.72 (L) 3.80 - 5.20 MIL/uL   Hemoglobin 9.9 (L) 12.0 - 16.0 g/dL   HCT 31.4 (L) 35.0 - 47.0 %   MCV 84.4 80.0 - 100.0 fL   MCH 26.7 26.0 - 34.0 pg  MCHC 31.6 (L) 32.0 - 36.0 g/dL   RDW 18.1 (H) 11.5 - 14.5 %   Platelets 515 (H) 150 - 440 K/uL   Neutrophils Relative % 92 %   Neutro Abs 18.3 (H) 1.4 - 6.5 K/uL   Lymphocytes Relative 4 %   Lymphs Abs 0.8 (L) 1.0 - 3.6 K/uL   Monocytes Relative 4 %   Monocytes Absolute 0.8 0.2 - 0.9 K/uL   Eosinophils Relative 0 %   Eosinophils Absolute 0.0 0 - 0.7 K/uL   Basophils Relative 0 %   Basophils Absolute 0.1 0 - 0.1 K/uL  Basic metabolic panel     Status: Abnormal   Collection Time: 06/10/15  1:21 AM  Result Value Ref Range   Sodium 142 135 - 145 mmol/L   Potassium 4.2 3.5 - 5.1 mmol/L   Chloride 110 101 - 111 mmol/L   CO2 17 (L) 22 - 32 mmol/L   Glucose, Bld 352 (H) 65 - 99 mg/dL   BUN 27 (H) 6 - 20 mg/dL   Creatinine, Ser 1.01 (H) 0.44 - 1.00 mg/dL   Calcium 8.2 (L) 8.9 - 10.3 mg/dL   GFR calc non Af Amer 52 (L) >60 mL/min   GFR calc Af Amer >60 >60 mL/min    Comment: (NOTE) The eGFR has been calculated using the CKD EPI equation. This calculation has not been validated in all clinical situations. eGFR's persistently <60 mL/min signify possible Chronic Kidney Disease.    Anion gap 15 5 - 15  Glucose, capillary     Status: Abnormal   Collection Time: 06/10/15  1:23 AM  Result Value Ref Range   Glucose-Capillary 330 (H) 65 - 99 mg/dL  Type and  screen [place]     Status: None (Preliminary result)   Collection Time: 06/10/15  4:59 AM  Result Value Ref Range   ABO/RH(D) A POS    Antibody Screen NEG    Sample Expiration 06/13/2015    Unit Number [id number]    Blood Component Type RBC, LR IRR    Unit division 00    Status of Unit ALLOCATED    Transfusion Status PENDING    Crossmatch Result PENDING    Unit Number [id number]    Blood Component Type RED CELLS,LR    Unit division 00    Status of Unit REL FROM [facility]    Transfusion Status OK TO TRANSFUSE    Crossmatch Result Compatible   Prepare RBC     Status: None   Collection Time: 06/10/15  4:59 AM  Result Value Ref Range   Order Confirmation ORDER PROCESSED BY BLOOD BANK   Glucose, capillary     Status: Abnormal   Collection Time: 06/10/15  7:08 AM  Result Value Ref Range   Glucose-Capillary 274 (H) 65 - 99 mg/dL  Hemoglobin     Status: Abnormal   Collection Time: 06/10/15 10:41 AM  Result Value Ref Range   Hemoglobin 9.0 (L) 12.0 - 16.0 g/dL  Glucose, capillary     Status: Abnormal   Collection Time: 06/10/15 11:06 AM  Result Value Ref Range   Glucose-Capillary 231 (H) 65 - 99 mg/dL  Glucose, capillary     Status: Abnormal   Collection Time: 06/10/15  5:14 PM  Result Value Ref Range   Glucose-Capillary 215 (H) 65 - 99 mg/dL  Glucose, capillary     Status: Abnormal   Collection Time: 06/10/15  9:41 PM  Result Value Ref Range   Glucose-Capillary  211 (H) 65 - 99 mg/dL   Comment 1 Notify RN   Glucose, capillary     Status: Abnormal   Collection Time: 06/11/15  7:22 AM  Result Value Ref Range   Glucose-Capillary 133 (H) 65 - 99 mg/dL  Glucose, capillary     Status: None   Collection Time: 06/11/15 11:06 AM  Result Value Ref Range   Glucose-Capillary 93 65 - 99 mg/dL  Comprehensive metabolic panel     Status: Abnormal   Collection Time: 06/11/15  1:24 PM  Result Value Ref Range   Sodium 144 135 - 145 mmol/L   Potassium 3.2 (L) 3.5  - 5.1 mmol/L   Chloride 120 (H) 101 - 111 mmol/L   CO2 19 (L) 22 - 32 mmol/L   Glucose, Bld 105 (H) 65 - 99 mg/dL   BUN 22 (H) 6 - 20 mg/dL   Creatinine, Ser 0.86 0.44 - 1.00 mg/dL   Calcium 7.6 (L) 8.9 - 10.3 mg/dL   Total Protein 5.3 (L) 6.5 - 8.1 g/dL   Albumin 1.6 (L) 3.5 - 5.0 g/dL   AST 124 (H) 15 - 41 U/L   ALT 133 (H) 14 - 54 U/L   Alkaline Phosphatase 47 38 - 126 U/L   Total Bilirubin 0.3 0.3 - 1.2 mg/dL   GFR calc non Af Amer >60 >60 mL/min   GFR calc Af Amer >60 >60 mL/min    Comment: (NOTE) The eGFR has been calculated using the CKD EPI equation. This calculation has not been validated in all clinical situations. eGFR's persistently <60 mL/min signify possible Chronic Kidney Disease.    Anion gap 5 5 - 15  Magnesium     Status: Abnormal   Collection Time: 06/11/15  1:24 PM  Result Value Ref Range   Magnesium 1.5 (L) 1.7 - 2.4 mg/dL   No components found for: ESR, C REACTIVE PROTEIN MICRO: Recent Results (from the past 720 hour(s))  Blood culture (routine x 2)     Status: None   Collection Time: 05/15/15  4:11 PM  Result Value Ref Range Status   Specimen Description BLOOD RIGHT WRIST  Final   Special Requests   Final    BOTTLES DRAWN AEROBIC AND ANAEROBIC 1CC AEROBIC,1CCANA   Culture NO GROWTH 5 DAYS  Final   Report Status 05/20/2015 FINAL  Final  Blood culture (routine x 2)     Status: None   Collection Time: 05/15/15  4:20 PM  Result Value Ref Range Status   Specimen Description BLOOD LEFT HAND  Final   Special Requests BOTTLES DRAWN AEROBIC AND ANAEROBIC 5CC  Final   Culture NO GROWTH 5 DAYS  Final   Report Status 05/20/2015 FINAL  Final  MRSA PCR Screening     Status: Abnormal   Collection Time: 05/15/15  8:57 PM  Result Value Ref Range Status   MRSA by PCR POSITIVE (A) NEGATIVE Final    Comment: READ BACK AND VERIFIED BY CASSIE STEWART @0028  05/16/15.Marland KitchenMarland KitchenAJO        The GeneXpert MRSA Assay (FDA approved for NASAL specimens only), is one component of  a comprehensive MRSA colonization surveillance program. It is not intended to diagnose MRSA infection nor to guide or monitor treatment for MRSA infections.   Urine culture     Status: None   Collection Time: 05/23/15 10:05 PM  Result Value Ref Range Status   Specimen Description URINE, CLEAN CATCH  Final   Special Requests NONE  Final   Culture  NO GROWTH 2 DAYS  Final   Report Status 05/26/2015 FINAL  Final  Culture, blood (routine x 2)     Status: None (Preliminary result)   Collection Time: 06/09/15 10:07 PM  Result Value Ref Range Status   Specimen Description BLOOD LEFT ASSIST CONTROL  Final   Special Requests BOTTLES DRAWN AEROBIC AND ANAEROBIC 4CC  Final   Culture  Setup Time   Final    GRAM NEGATIVE RODS GRAM POSITIVE COCCI IN BOTH AEROBIC AND ANAEROBIC BOTTLES CRITICAL RESULT CALLED TO, READ BACK BY AND VERIFIED WITH: HENRY ZOMPA 06/10/15 1125AM MLM    Culture   Final    ENTEROCOCCUS SPECIES ESCHERICHIA COLI PROTEUS SPECIES IN BOTH AEROBIC AND ANAEROBIC BOTTLES    Report Status PENDING  Incomplete  Blood Culture ID Panel (Reflexed)     Status: Abnormal   Collection Time: 06/09/15 10:07 PM  Result Value Ref Range Status   Enterococcus species DETECTED (A) NOT DETECTED Final   Listeria monocytogenes NOT DETECTED NOT DETECTED Final   Staphylococcus species NOT DETECTED NOT DETECTED Final   Staphylococcus aureus NOT DETECTED NOT DETECTED Final   Streptococcus species NOT DETECTED NOT DETECTED Final   Streptococcus agalactiae NOT DETECTED NOT DETECTED Final   Streptococcus pneumoniae NOT DETECTED NOT DETECTED Final   Streptococcus pyogenes NOT DETECTED NOT DETECTED Final   Acinetobacter baumannii NOT DETECTED NOT DETECTED Final   Enterobacteriaceae species DETECTED (A) NOT DETECTED Final   Enterobacter cloacae complex NOT DETECTED NOT DETECTED Final   Escherichia coli DETECTED (A) NOT DETECTED Final   Klebsiella oxytoca NOT DETECTED NOT DETECTED Final   Klebsiella  pneumoniae NOT DETECTED NOT DETECTED Final   Proteus species DETECTED (A) NOT DETECTED Final   Serratia marcescens NOT DETECTED NOT DETECTED Final   Haemophilus influenzae NOT DETECTED NOT DETECTED Final   Neisseria meningitidis NOT DETECTED NOT DETECTED Final   Pseudomonas aeruginosa NOT DETECTED NOT DETECTED Final   Candida albicans NOT DETECTED NOT DETECTED Final   Candida glabrata NOT DETECTED NOT DETECTED Final   Candida krusei NOT DETECTED NOT DETECTED Final   Candida parapsilosis NOT DETECTED NOT DETECTED Final   Candida tropicalis NOT DETECTED NOT DETECTED Final   Carbapenem resistance NOT DETECTED NOT DETECTED Final   Methicillin resistance NOT DETECTED NOT DETECTED Final   Vancomycin resistance DETECTED (A) NOT DETECTED Final    Comment: CRITICAL RESULT CALLED TO, READ BACK BY AND VERIFIED WITHMallie Mussel ZOMPA 1125AM 06/10/15 DETECTED: VANA/B, ENTEROCOCCUS SP, ENTEROBACTERIACEAE, E.COLI, PROTEUS SP MLM   Culture, blood (routine x 2)     Status: None (Preliminary result)   Collection Time: 06/09/15 10:08 PM  Result Value Ref Range Status   Specimen Description BLOOD LEFT ARM  Final   Special Requests BOTTLES DRAWN AEROBIC AND ANAEROBIC 4CC  Final   Culture  Setup Time   Final    GRAM NEGATIVE RODS IN BOTH AEROBIC AND ANAEROBIC BOTTLES GRAM POSITIVE COCCI ANAEROBIC BOTTLE ONLY CRITICAL RESULT CALLED TO, READ BACK BY AND VERIFIED WITH: HENRY ZOMPA 06/10/15 1240PM MLM    Culture   Final    GRAM POSITIVE COCCI ANAEROBIC BOTTLE ONLY GRAM NEGATIVE RODS IN BOTH AEROBIC AND ANAEROBIC BOTTLES GRAM NEGATIVE RODS IDENTIFICATION AND SUSCEPTIBILITIES TO FOLLOW ONCE ISOLATED    Report Status PENDING  Incomplete  Urine culture     Status: None (Preliminary result)   Collection Time: 06/09/15 10:08 PM  Result Value Ref Range Status   Specimen Description URINE, RANDOM  Final   Special Requests NONE  Final   Culture   Final    >=100,000 COLONIES/mL GAMMA HEMOLYSIS IDENTIFICATION AND  SUSCEPTIBILITIES TO FOLLOW    Report Status PENDING  Incomplete    IMAGING: Ct Abdomen Pelvis Wo Contrast  06/10/2015  CLINICAL DATA:  Acute onset of vomiting. Sepsis. Suggestion of mild hematemesis. Diffuse abdominal pain and elevated lactic acid. Initial encounter. EXAM: CT ABDOMEN AND PELVIS WITHOUT CONTRAST TECHNIQUE: Multidetector CT imaging of the abdomen and pelvis was performed following the standard protocol without IV contrast. COMPARISON:  CT of the abdomen and pelvis from 11/21/2014 FINDINGS: Trace bilateral pleural effusions are noted, with mild atelectasis. A 1.1 cm nodule is noted at the right lower lobe (image 1 of 18). Scattered coronary artery calcifications are seen. Trace pericardial fluid likely remains within normal limits. An enteric tube is noted extending to the body of the stomach. The liver and spleen are unremarkable in appearance. Tiny stones are noted dependently in the gallbladder. The gallbladder is otherwise unremarkable. The pancreas and adrenal glands are unremarkable. Nonspecific perinephric stranding is noted bilaterally. Mild left renal scarring is noted. There is no evidence of hydronephrosis. No renal or ureteral stones are identified. Scattered vascular calcifications are noted at the renal hila bilaterally. Masses about the retroperitoneum have increased in size, the largest of which appears to partially extend into the inferior vena cava, measuring approximately 5.9 x 4.5 cm. This extends distal to the aortic bifurcation. Enlarged mesenteric nodes measure up to 1.8 cm in short axis. The small bowel is unremarkable in appearance. The stomach is within normal limits. No acute vascular abnormalities are seen. Scattered calcification is noted along the celiac trunk, superior mesenteric artery and at the origins of the renal arteries bilaterally. The appendix is normal in caliber, without evidence of appendicitis. The colon is grossly unremarkable in appearance. The  bladder is decompressed, with a Foley catheter in place. A small amount of free fluid is seen within the pelvis. The patient is status post hysterectomy. No suspicious adnexal masses are seen. No inguinal lymphadenopathy is seen. No acute osseous abnormalities are identified. IMPRESSION: 1. Masses about the retroperitoneum have increased in size, the largest of which appears to partially extend into the inferior vena cava, measuring 5.9 x 4.5 cm. This extends distal to the aortic bifurcation. Enlarged mesenteric nodes measure up to 1.8 cm in short axis. 2. 1.1 cm nodule at the right lower lobe is nonspecific, but could reflect metastatic disease. 3. Trace bilateral pleural effusions, with mild atelectasis. 4. Scattered coronary artery calcifications seen. 5. Cholelithiasis; gallbladder otherwise unremarkable. 6. Mild left renal scarring noted. 7. Scattered scattered calcification along the celiac trunk, superior mesenteric artery, and at the origins of the renal arteries bilaterally. Electronically Signed   By: Garald Balding M.D.   On: 06/10/2015 06:37   Dg Chest 2 View  05/16/2015  CLINICAL DATA:  Pulmonary edema. EXAM: CHEST  2 VIEW COMPARISON:  05/15/2015 FINDINGS: Improved pulmonary edema. Small bilateral pleural effusion, greater on the left. Chronic cardiomegaly. Porta catheter from the right, tip still at the SVC level. Calcified peritracheal nodes on the right. There is a 21 mm nodule over the right perihilar lung which appears increased from July 2016, suspected metastatic disease in this patient with metastatic retroperitoneal adenopathy. IMPRESSION: 1. Improved pulmonary edema.  Stable small pleural effusions. 2. 2 cm right perihilar nodule which appears increased from July 2016. Electronically Signed   By: Monte Fantasia M.D.   On: 05/16/2015 13:42   Dg Abd 1 View  06/10/2015  CLINICAL DATA:  Enteric tube placement.  Initial encounter. EXAM: ABDOMEN - 1 VIEW COMPARISON:  Abdominal radiograph  performed earlier today at 3:19 a.m. FINDINGS: The patient's enteric tube is noted ending overlying the body of the stomach. The visualized bowel gas pattern is grossly unremarkable. No free intra-abdominal air is seen, though evaluation for free air is limited on a single supine view. No acute osseous abnormalities are identified. Mild degenerative change is noted along the lower thoracic spine. Vascular stents are seen overlying the upper pelvis. IMPRESSION: Enteric tube noted ending overlying the body of the stomach. Electronically Signed   By: Garald Balding M.D.   On: 06/10/2015 04:05   Dg Abd 1 View  06/10/2015  CLINICAL DATA:  Nasogastric tube placement.  Initial encounter. EXAM: ABDOMEN - 1 VIEW COMPARISON:  Abdominal radiograph performed earlier today at 12:22 a.m. FINDINGS: The patient's enteric tube is noted coiling at the distal esophagus. The visualized bowel gas pattern is grossly unremarkable. A moderate amount of air is seen in the stomach. No free intra-abdominal air is seen, though evaluation for free air is limited on a single supine view. Mild degenerative change is noted along the lower thoracic spine. Vascular stents are noted overlying the upper pelvis. IMPRESSION: 1. Enteric tube noted coiling at the distal esophagus. This was subsequently repositioned. 2. Unremarkable bowel gas pattern; no free intra-abdominal air seen. Electronically Signed   By: Garald Balding M.D.   On: 06/10/2015 04:02   Dg Abd 1 View  06/10/2015  CLINICAL DATA:  Vomiting. EXAM: ABDOMEN - 1 VIEW COMPARISON:  Most recent CT 11/21/2014 FINDINGS: Air within the stomach without gastric distention. No small bowel dilatation. Small volume of stool throughout the colon without colonic dilatation. No evidence of free air on supine views. Extensive atherosclerotic calcification of the abdominal vasculature with vascular stents in the pelvis. Splenic granuloma are noted. IMPRESSION: Nonobstructive bowel gas pattern. Air in the  stomach without gastric distension. Electronically Signed   By: Jeb Levering M.D.   On: 06/10/2015 01:11   US Renal  05/17/2015  CLINICAL DATA:  Stage IV chronic renal disease EXAM: RENAL ULTRASOUND COMPARISON:  CT abdomen and pelvis November 21, 2014 FINDINGS: Right Kidney: Length: 11.5 cm. Echogenicity within normal limits. There is renal cortical thinning. No mass, perinephric fluid, or hydronephrosis visualized. There is focal scarring along the periphery of the lower pole of the right kidney. There is no sonographically demonstrable calculus or ureterectasis. Left Kidney: Length: 8.3 cm. Echogenicity within normal limits. There is renal cortical thinning. No mass, perinephric fluid, or hydronephrosis visualized. No sonographically demonstrable calculus or ureterectasis. Bladder: Urinary bladder is decompressed and cannot be assessed at this time. IMPRESSION: Bilateral renal cortical thinning, a finding that may be seen with medical renal disease. The renal echogenicity is felt to be within normal limits. No obstructing focus identified on either side. There is scarring in the lower pole right kidney region. There is size discrepancy between the kidneys with left kidney smaller than the right. This finding potentially may be indicative of renal artery stenosis on the left. In this regard, question whether patient is hypertensive. Electronically Signed   By: Lowella Grip III M.D.   On: 05/17/2015 17:17   Nm Pulmonary Perf And Vent  05/16/2015  CLINICAL DATA:  Pulmonary edema. EXAM: NUCLEAR MEDICINE VENTILATION - PERFUSION LUNG SCAN TECHNIQUE: Ventilation images were obtained in multiple projections using inhaled aerosol Tc-33mDTPA. Perfusion images were obtained in multiple projections after intravenous injection  of Tc-51mMAA. RADIOPHARMACEUTICALS:  32.7 Technetium-939mTPA aerosol inhalation. No technetium 99 m MAA administered. COMPARISON:  Chest x-ray 05/26/2015. FINDINGS: No ventilation perfusion  images obtained as patient would only allow ventilation for approximately 1 minute. The patient refused remainder of the exam. IMPRESSION: Patient refused exam just after ventilation.  No images obtained. Electronically Signed   By: [place] On: 05/16/2015 14:54   Dg Chest Port 1 View  06/09/2015  CLINICAL DATA:  Acute onset of altered mental status. Initial encounter. EXAM: PORTABLE CHEST 1 VIEW COMPARISON:  Chest radiograph performed 05/16/2015 FINDINGS: The lungs are well-aerated. A 1.9 cm nodule is again noted at the right midlung zone, concerning for metastatic disease given the patient's history. There is no evidence of pleural effusion or pneumothorax. The cardiomediastinal silhouette is borderline normal in size. No acute osseous abnormalities are seen. A right-sided chest port is noted ending about the distal SVC. IMPRESSION: 1.9 cm nodule again noted at the right midlung zone, concerning for metastatic disease given the patient's history. Lungs otherwise grossly clear. Electronically Signed   By: JeGarald Balding.D.   On: 06/09/2015 22:57   Dg Chest Port 1 View  05/15/2015  CLINICAL DATA:  Confusion. Difficulty breathing. History of endometrial carcinoma. EXAM: PORTABLE CHEST 1 VIEW COMPARISON:  December 12, 2014 FINDINGS: There is airspace consolidation throughout much of the right mid and lower lung zones. A lesser degree of infiltrate is noted in the left base. There is also underlying interstitial edema. Heart is slightly enlarged with pulmonary venous hypertension. No adenopathy. Port-A-Cath tip is in the superior vena cava. No pneumothorax. IMPRESSION: Airspace consolidation in both lung bases as well as in the right mid lung. Underlying congestive heart failure. The airspace opacity may represent alveolar edema, although the appearance is concerning for superimposed pneumonia. Note that a nodular opacity in the right mid lung noted previously is obscured by infiltrate currently.  Electronically Signed   By: WiLowella GripII M.D.   On: 05/15/2015 16:15    Assessment:   Shelley NAGELEs a [age].o. female with stage IV endometrial cancer admitted with sepsis from large unstagable sacral decub.  Has polymicrobial bacteremia including VRE. Has been on linezolid and zosIyn.  Since I have evaluatd patient the decision has been made to seek comfort care.  She has advanced incurable disease and I agree with palliative care and hospice. Agree with stopping IV abx.   Thank you very much for allowing me to participate in the care of this patient. Please call with questions.   DaCheral MarkerFiOla SpurrMD

## 2015-06-11 NOTE — Care Management Important Message (Signed)
Important Message  Patient Details  Name: Shelley Fields MRN: [id number] Date of Birth: 13-Feb-1936   Medicare Important Message Given:  Yes    Shelbie Ammons, RN 06/11/2015, 11:04 AM

## 2015-06-11 NOTE — Progress Notes (Signed)
[facility] [place] NAME: Shelley Fields    MR#:  [id number]  DATE OF BIRTH:  [birth year]  SUBJECTIVE: Today has coffee-ground stuff from NG tube.  Awake today. Complains of abdominal pain. Having a lot of PACs. Sister with the bedside.   CHIEF COMPLAINT:   Chief Complaint  Patient presents with  . Altered Mental Status    REVIEW OF SYSTEMS:   ROS CONSTITUTIONAL: No fever, fatigue or weakness.  EYES: No blurred or double vision.  EARS, NOSE, AND THROAT: No tinnitus or ear pain.  RESPIRATORY: No cough, shortness of breath, wheezing or hemoptysis.  CARDIOVASCULAR: No chest pain, orthopnea, edema.  GASTROINTESTINAL: No nausea, vomiting, diarrhea or abdominal pain.  GENITOURINARY: No dysuria, hematuria.  ENDOCRINE: No polyuria, nocturia,  HEMATOLOGY: No anemia, easy bruising or bleeding SKIN: No rash or lesion. MUSCULOSKELETAL: No joint pain or arthritis.   NEUROLOGIC: No tingling, numbness, weakness.  PSYCHIATRY: No anxiety or depression.   DRUG ALLERGIES:   Allergies  Allergen Reactions  . Contrast Media [Iodinated Diagnostic Agents]     Kidneys are operational at 28% and wont use dyes    VITALS:  Blood pressure 168/73, pulse 113, temperature 99.2 F (37.3 C), temperature source Axillary, resp. rate 20, weight 70.716 kg (155 lb 14.4 oz), SpO2 99 %.  PHYSICAL EXAMINATION:  GENERAL:  80 y.o.-year-old patient lying in the bed with no acute distress.  EYES: Pupils equal, round, reactive to light and accommodation. No scleral icterus. Extraocular muscles intact.  HEENT: Head atraumatic, normocephalic. Oropharynx and nasopharynx clear.  NECK:  Supple, no jugular venous distention. No thyroid enlargement, no tenderness.  LUNGS: Normal breath sounds bilaterally, no wheezing, rales,rhonchi or crepitation. No use of accessory muscles of respiration.  CARDIOVASCULAR: S1, S2 normal. No murmurs, rubs, or gallops.  ABDOMEN: Soft,  nontender, nondistended. Bowel sounds present. No organomegaly or mass.  EXTREMITIES: No pedal edema, cyanosis, or clubbing.  NEUROLOGIC: Cranial nerves II through XII are intact. Muscle strength 5/5 in all extremities. Sensation intact. Gait not checked.  PSYCHIATRIC: The patient is alert and oriented x 3.  SKIN: No obvious rash, lesion, or ulcer.    LABORATORY PANEL:   CBC  Recent Labs Lab 06/10/15 0121 06/10/15 1041  WBC 20.0*  --   HGB 9.9* 9.0*  HCT 31.4*  --   PLT 515*  --    ------------------------------------------------------------------------------------------------------------------  Chemistries   Recent Labs Lab 06/09/15 2207 06/10/15 0121  NA 144 142  K 4.5 4.2  CL 109 110  CO2 20* 17*  GLUCOSE 265* 352*  BUN 27* 27*  CREATININE 1.10* 1.01*  CALCIUM 8.8* 8.2*  AST 308*  --   ALT 126*  --   ALKPHOS 47  --   BILITOT 0.8  --    ------------------------------------------------------------------------------------------------------------------  Cardiac Enzymes No results for input(s): TROPONINI in the last 168 hours. ------------------------------------------------------------------------------------------------------------------  RADIOLOGY:  Ct Abdomen Pelvis Wo Contrast  06/10/2015  CLINICAL DATA:  Acute onset of vomiting. Sepsis. Suggestion of mild hematemesis. Diffuse abdominal pain and elevated lactic acid. Initial encounter. EXAM: CT ABDOMEN AND PELVIS WITHOUT CONTRAST TECHNIQUE: Multidetector CT imaging of the abdomen and pelvis was performed following the standard protocol without IV contrast. COMPARISON:  CT of the abdomen and pelvis from 11/21/2014 FINDINGS: Trace bilateral pleural effusions are noted, with mild atelectasis. A 1.1 cm nodule is noted at the right lower lobe (image 1 of 18). Scattered coronary artery calcifications are seen. Trace pericardial fluid likely remains  within normal limits. An enteric tube is noted extending to the body of  the stomach. The liver and spleen are unremarkable in appearance. Tiny stones are noted dependently in the gallbladder. The gallbladder is otherwise unremarkable. The pancreas and adrenal glands are unremarkable. Nonspecific perinephric stranding is noted bilaterally. Mild left renal scarring is noted. There is no evidence of hydronephrosis. No renal or ureteral stones are identified. Scattered vascular calcifications are noted at the renal hila bilaterally. Masses about the retroperitoneum have increased in size, the largest of which appears to partially extend into the inferior vena cava, measuring approximately 5.9 x 4.5 cm. This extends distal to the aortic bifurcation. Enlarged mesenteric nodes measure up to 1.8 cm in short axis. The small bowel is unremarkable in appearance. The stomach is within normal limits. No acute vascular abnormalities are seen. Scattered calcification is noted along the celiac trunk, superior mesenteric artery and at the origins of the renal arteries bilaterally. The appendix is normal in caliber, without evidence of appendicitis. The colon is grossly unremarkable in appearance. The bladder is decompressed, with a Foley catheter in place. A small amount of free fluid is seen within the pelvis. The patient is status post hysterectomy. No suspicious adnexal masses are seen. No inguinal lymphadenopathy is seen. No acute osseous abnormalities are identified. IMPRESSION: 1. Masses about the retroperitoneum have increased in size, the largest of which appears to partially extend into the inferior vena cava, measuring 5.9 x 4.5 cm. This extends distal to the aortic bifurcation. Enlarged mesenteric nodes measure up to 1.8 cm in short axis. 2. 1.1 cm nodule at the right lower lobe is nonspecific, but could reflect metastatic disease. 3. Trace bilateral pleural effusions, with mild atelectasis. 4. Scattered coronary artery calcifications seen. 5. Cholelithiasis; gallbladder otherwise  unremarkable. 6. Mild left renal scarring noted. 7. Scattered scattered calcification along the celiac trunk, superior mesenteric artery, and at the origins of the renal arteries bilaterally. Electronically Signed   By: Garald Balding M.D.   On: 06/10/2015 06:37   Dg Abd 1 View  06/10/2015  CLINICAL DATA:  Enteric tube placement.  Initial encounter. EXAM: ABDOMEN - 1 VIEW COMPARISON:  Abdominal radiograph performed earlier today at 3:19 a.m. FINDINGS: The patient's enteric tube is noted ending overlying the body of the stomach. The visualized bowel gas pattern is grossly unremarkable. No free intra-abdominal air is seen, though evaluation for free air is limited on a single supine view. No acute osseous abnormalities are identified. Mild degenerative change is noted along the lower thoracic spine. Vascular stents are seen overlying the upper pelvis. IMPRESSION: Enteric tube noted ending overlying the body of the stomach. Electronically Signed   By: Garald Balding M.D.   On: 06/10/2015 04:05   Dg Abd 1 View  06/10/2015  CLINICAL DATA:  Nasogastric tube placement.  Initial encounter. EXAM: ABDOMEN - 1 VIEW COMPARISON:  Abdominal radiograph performed earlier today at 12:22 a.m. FINDINGS: The patient's enteric tube is noted coiling at the distal esophagus. The visualized bowel gas pattern is grossly unremarkable. A moderate amount of air is seen in the stomach. No free intra-abdominal air is seen, though evaluation for free air is limited on a single supine view. Mild degenerative change is noted along the lower thoracic spine. Vascular stents are noted overlying the upper pelvis. IMPRESSION: 1. Enteric tube noted coiling at the distal esophagus. This was subsequently repositioned. 2. Unremarkable bowel gas pattern; no free intra-abdominal air seen. Electronically Signed   By: Francoise Schaumann.D.  On: 06/10/2015 04:02   Dg Abd 1 View  06/10/2015  CLINICAL DATA:  Vomiting. EXAM: ABDOMEN - 1 VIEW COMPARISON:  Most  recent CT 11/21/2014 FINDINGS: Air within the stomach without gastric distention. No small bowel dilatation. Small volume of stool throughout the colon without colonic dilatation. No evidence of free air on supine views. Extensive atherosclerotic calcification of the abdominal vasculature with vascular stents in the pelvis. Splenic granuloma are noted. IMPRESSION: Nonobstructive bowel gas pattern. Air in the stomach without gastric distension. Electronically Signed   By: Jeb Levering M.D.   On: 06/10/2015 01:11   Dg Chest Port 1 View  06/09/2015  CLINICAL DATA:  Acute onset of altered mental status. Initial encounter. EXAM: PORTABLE CHEST 1 VIEW COMPARISON:  Chest radiograph performed 05/16/2015 FINDINGS: The lungs are well-aerated. A 1.9 cm nodule is again noted at the right midlung zone, concerning for metastatic disease given the patient's history. There is no evidence of pleural effusion or pneumothorax. The cardiomediastinal silhouette is borderline normal in size. No acute osseous abnormalities are seen. A right-sided chest port is noted ending about the distal SVC. IMPRESSION: 1.9 cm nodule again noted at the right midlung zone, concerning for metastatic disease given the patient's history. Lungs otherwise grossly clear. Electronically Signed   By: Garald Balding M.D.   On: 06/09/2015 22:57    EKG:   Orders placed or performed during the hospital encounter of 06/09/15  . EKG 12-Lead  . EKG 12-Lead    ASSESSMENT AND PLAN:   #1 sepsis secondary to sacral decubiti;;  Blood culture showed VRE in the blood: Patient is on Zyvox, ID consult placed.   2. GI bleed with abdominal pain: Continue PPIs, IV hydration, NG tube to LIS,  she is to keep hemoglobin more than 9. Hold aspirin, lovenox. Transfuse as needed. So far transfused 2 units of packed RBC. #3 metabolic encephalopathy secondary to sepsis, GI bleed.continue Protonix drip. #4 history of recent gangrene of the right foot status post  BKA. #5 DMII; as patient is nothing by mouth we will decrease the NovoLog correction to sensitive scale. And continue basal dose of Levemir. #6 hypertension; a PACs: Continue Cardizem, DC Norvasc. Continue hydration.  #7 history of PVD with right femoral endarterectomy, right leg gangrene status post BKA. #8. History of endometrial cancer in 2012 with hysterectomy, metastatic disease to pelvis, lymph nodes status post chemotherapy, surgery abdominal CAT scan showed worsening of lymphnodes , by oncology. Not a candidate for further chemotherapy.  Discussed the Same with Patient and Patient's Daughter [name], Patient's Sister. They're in Agreement to Talk to Palliative Care. Prognosis Is Very Poor, High Risk for Cardiac Arrest. CODE STATUS DO NOT RESUSCITATE.    All the records are reviewed and case discussed with Care Management/Social Workerr. Management plans discussed with the patient, family and they are in agreement.  CODE STATUS: DNR  TOTAL TIME TAKING CARE OF THIS PATIENT: 45 minutes. ( critical care)  POSSIBLE D/C IN 4-5 DAYS, DEPENDING ON CLINICAL CONDITION.   Epifanio Lesches M.D on 06/11/2015 at 12:37 PM  Between 7am to 6pm - Pager - [phone]  After 6pm go to [email or web address] - password [email or web address]  Office  [phone]  CC: Primary care physician; [name], MD   Note: This dictation was prepared with Dragon dictation along with smaller phrase technology. Any transcriptional errors that result from this process are unintentional.

## 2015-06-11 NOTE — Plan of Care (Signed)
Problem: Education: Goal: Knowledge of [facility] Education information/materials will improve Outcome: Progressing Pt is alert at times. Became more wakeful later during the shift. Pt reoriented to what is happening, current location. Therapeutic presence given when patient appeared to be in discomfort. Pt's speech is garbled, incomprehensible at times.      Problem: Safety: Goal: Ability to remain free from injury will improve Outcome: Progressing Pt is high fall risk, repositioned to decrease pressure on bony prominences. Hourly rounding on patient.     Problem: Pain Managment: Goal: General experience of comfort will improve Outcome: Progressing Pt appeared to be in pain, morphine given with desired affect. Continue to assess.

## 2015-06-11 NOTE — Progress Notes (Signed)
Pt in distress, complaining of pain. Morphine not due yet, MD notified and changed PRN order to every 3 hours.

## 2015-06-11 NOTE — Progress Notes (Signed)
Notified Dr. Vianne Bulls that patient was running PAT's with HR elevated to 170, pt is now back down to 110, MD acknowledged. Telephone.

## 2015-06-11 NOTE — Progress Notes (Signed)
Initial Nutrition Assessment  DOCUMENTATION CODES:   Non-severe (moderate) malnutrition in context of chronic illness  INTERVENTION:   Coordination of Care: await diet advancement as medically able; will follow poc   NUTRITION DIAGNOSIS:   Malnutrition related to chronic illness as evidenced by moderate depletion of body fat, severe depletion of muscle mass.  GOAL:   Patient will meet greater than or equal to 90% of their needs  MONITOR:    (Energy Intake, Anthropometrics, Electrolyte and renal Profile, Skin)  REASON FOR ASSESSMENT:    (Pressure Ulcers)    ASSESSMENT:   Pt admitted with sepsis secondary to stage IV sacral decubitus ulcer, per sugery no debridement at this time. Pt with recent right BKA. Pt admitted with n/v, s/p NG tube placement to LIS. Pt also with h/o stage IV endometrial cancer, no chemotherapy scheduled per MD note. Noted palliative care consulted.  Past Medical History  Diagnosis Date  . Diabetes mellitus without complication ([place])   . C. difficile diarrhea   . Uterine cancer ([place])   . Hypogammaglobulinemia ([place])   . Pulmonary embolism ([place])   . Decubitus ulcers   . Hypertension   . Hypercholesterolemia   . Incontinence of urine   . Cancer [facility])     Endometrial Cancer  . Cervical cancer ([place])     with radiation 45 years ago...  . Arthritis   . GERD (gastroesophageal reflux disease)   . Bladder cancer ([place])   . Decubitus ulcer of buttock, stage 1   . Peripheral vascular disease ([place])   . Anemia   . Coronary artery disease   . CHF (congestive heart failure) ([place])   . Shortness of breath dyspnea   . Hypoxia   . Gangrene of foot ([place])     right  . Myocardial infarction [facility])     nstemi 12/16  . Chronic kidney disease     acute on chronic renal failure 12/16  . Dementia   . Diabetes ([place])      Diet Order:  Diet NPO time specified    Current Nutrition: PT NPO; NG tube in place to LIS  Food/Nutrition-Related History: Unable to  clarify on visit today. Per MST on last admission, poor po intake.    Scheduled Medications:  . diltiazem  30 mg Oral 4 times per day  . docusate sodium  100 mg Oral BID  . insulin aspart  0-9 Units Subcutaneous TID WC  . insulin detemir  15 Units Subcutaneous QHS  . linezolid (ZYVOX) IV  600 mg Intravenous Q12H  . magnesium oxide  400 mg Oral Daily  . megestrol  200 mg Oral Daily  . metoprolol tartrate  25 mg Oral BID  . mirtazapine  15 mg Oral QHS  . [START ON 06/13/2015] pantoprazole (PROTONIX) IV  40 mg Intravenous Q12H  . piperacillin-tazobactam (ZOSYN)  IV  3.375 g Intravenous 3 times per day  . risperiDONE  0.25 mg Oral QHS  . sertraline  25 mg Oral Daily  . tamsulosin  0.4 mg Oral BID    Continuous Medications:  . sodium chloride 100 mL/hr at 06/11/15 1348  . pantoprozole (PROTONIX) infusion 8 mg/hr (06/11/15 0447)     Electrolyte/Renal Profile and Glucose Profile:   Recent Labs Lab 06/09/15 2207 06/10/15 0121 06/11/15 1324  NA 144 142 144  K 4.5 4.2 3.2*  CL 109 110 120*  CO2 20* 17* 19*  BUN 27* 27* 22*  CREATININE 1.10* 1.01* 0.86  CALCIUM 8.8* 8.2* 7.6*  MG  --   --  1.5*  GLUCOSE 265* 352* 105*   Protein Profile:   Recent Labs Lab 06/09/15 2207 06/11/15 1324  ALBUMIN 2.4* 1.6*    Gastrointestinal Profile: NG tube to suction, 242mL out this am, 317mL out last night documented Last BM: 06/10/2015   Nutrition-Focused Physical Exam Findings: Nutrition-Focused physical exam completed. Findings are moderate fat depletion, moderate-severe muscle depletion, and no edema.     Weight Change: Per CHL encounters pt with stable weight, however pt s/p recent BKA. Question accuracy   Skin:   Multiple pressure ulcers including stage IV sacral pressure ulcer, unstageable heel pressure ulcer, stage III buttocks  Height:   Ht Readings from Last 1 Encounters:  05/29/15 5' (1.524 m)    Weight:   Wt Readings from Last 1 Encounters:  06/11/15 155 lb 14.4 oz  (70.716 kg)     Wt Readings from Last 10 Encounters:  06/11/15 155 lb 14.4 oz (70.716 kg)  06/07/15 147 lb (66.679 kg)  05/15/15 152 lb 11.2 oz (69.264 kg)  05/14/15 156 lb (70.761 kg)  04/05/15 156 lb (70.761 kg)  02/12/15 156 lb 6.4 oz (70.943 kg)  12/17/14 164 lb 4.8 oz (74.526 kg)  11/30/14 156 lb 6.7 oz (70.95 kg)  11/23/14 151 lb 14.4 oz (68.9 kg)    Ideal Body Weight:   42.8kg  BMI:  Body mass index is 30.45 kg/(m^2).  Estimated Nutritional Needs:   Kcal:  1202-1402kcals, BEE: 835kcals, TEE: (IF 1.2-1.4)(AF 1.2) using adjusted body weight of 42.8kg  Protein:  51-64g protein (1.2-1.5g/kg)  Fluid:  1070-1259mL of fluid (25-92mL/kg)  EDUCATION NEEDS:   Education needs no appropriate at this time   [place], RD, LDN Pager [phone] Weekend/On-Call Pager [phone]

## 2015-06-11 NOTE — Plan of Care (Signed)
Problem: Pain Management: Goal: Satisfaction with pain management regimen will improve Outcome: Progressing Pt is alert at times, when alert patient is moaning and appears to be uncomfortable and in pain, pt in on room air, pain improved with dilaudid, pt resting comfortably after dilaudid administration, sister visiting at bedside in am, NG tube in place, clamped per Dr. Tiffany Kocher, per Dr. Megan Salon leave NG tube in until 1/4 to see if patient tolerates well without the need for suctioning. ID MD consulted with patient prior to patient becoming comfort care, palliative MD spoke with pt's sister and daughter and plan is for patient to be d/c to hospice home. Pt has multiple wounds, q2h turns performed, chronic foley in place and pt will be d/c with foley. BP elevated that improved some with IV labetalol.

## 2015-06-11 NOTE — Consult Note (Signed)
Palliative Medicine Inpatient Consult Note   Name: Shelley Fields Date: 06/11/2015 MRN: [id number]  DOB: July 21, 1935  Referring Physician: Epifanio Lesches, MD  Palliative Care consult requested for this 80 y.o. female for goals of medical therapy in patient with endometrial cancer and a recent right BKA.  She has been under Hospice care in the past and it was thought she would have returned to being under [facility] following the recent BKA (done due to PVD).  However, she did not get readmitted to hospice at that time and now presents here with multiple acute problems in addition to her chronic conditions. She has sepsis due to sacral decubiti with VRE in the blood cultures. She is getting Zyvox and ID has been consulted.  She now has coffee ground matter in NG tube with abdominal pain.  She has gotten 2 units of blood so far.  She has been confused sinice admission due to sepsis.  She has a DNR code status but it is not known if patient is to resume care under [facility] as yet.  Pt's daughter, [name] (out of state), is Media planner.  Pt also has a sister who is local.  Pt now with poor urine output and zero in bladder but IVF going at 100 cc / hr.  Care Team: Dr. Vianne Bulls Dr. Vira Agar (GI) Dr Grayland Ormond (Oncology) Dr Adonis Huguenin (surgery)     TODAYS' DISCUSSIONS AND DECISIONS:  Pt is quite terminal with multiple medical problems that are not reversible.    I have called and spoken with pt's sister and daughter.  Sister requested pt go to [facility]. Daughter is aware.  I gave options for Hospice to resume at [facility], but family would prefer Hospice Home (for their own reasons).    Pt is likely to die within 2 - 3 weeks (quite possibly sooner) given sepsis, Stage IV decubitus, CHF, GI bleeding, AND a mass that is extending into her inferior vena cava. This mass is evidence of imminently terminal metastatic (Stage IV) endometrial cancer.    I explained a little bit about  hospice services and stated that they would be contacted in the am by Hospice Liaison nurse.  Daughter will await call and she is calling sister to update her.  I explained comfort care to both and have started comfort care orders.  Pt will need more than morphine at [facility] --as it 'wasn't working' here.  She may need IV pain meds b/c of the GI bleeding (now stable but could recur).  I will see how pt is doing in the am and adjust Discharge meds depending on how she is at time of discharge.  Will update attending and care team.    NG can be pulled as there was nearly no output from this earlier (60 ml --it had been 300 overnight last night).  Antiemetic regimen is increased.   IMPRESSION: 1.  Septic Shock ----due to sacral decubitus ulcer that is infected 2.  VRE sepsis 3.  Metabolic Encephalopathy 4.  Recent gangrene of right foot s/p R BKA (due to PVD) 5.  She has been a hospice pt (with [facility]) ---but this was not restarted after pt's BKA 6.   Stage IV Endometrial Cancer ----Seen by Dr. Mike Gip on Apr 29, 2015 and it was determined that her poor performance status meant no chemo would be appropriate  ----history of radiation for cervical CA 45 yrs ago 7.  DM2 8.  GI bleed with  coffee grounds in NG ----NPO 9.  Anemia of chronic disease and anemia of acute GI blood loss 10.  Stage IV Decubitus ulcer 11.  HTN 12.  Hypogammaglobulinemia 13.  H/O C diff in past 14. Urinary incontinence 15.  PVD 16.  GERD 17.  Acute on chronic renal failure 18. Chronic diastolic CHF 75.  H/O PE  Was on coumadin 20.  Weakness and fatigue 21.  CAD with NSTEMI on 12/16/201 22.  Dementia ---with occasional delerium and agitation 23.  Constipation 24. DM2    REVIEW OF SYSTEMS:  Patient is not able to provide ROS due to dementia and confusion and illness.   SPIRITUAL SUPPORT SYSTEM: Yes.  SOCIAL HISTORY:  reports that she has never smoked. She does not have any  smokeless tobacco history on file. She reports that she does not drink alcohol or use illicit drugs.  LEGAL DOCUMENTS:   Portable DNR form is in the paper chart.    CODE STATUS: DNR  PAST MEDICAL HISTORY: Past Medical History  Diagnosis Date  . Diabetes mellitus without complication ([place])   . C. difficile diarrhea   . Uterine cancer ([place])   . Hypogammaglobulinemia ([place])   . Pulmonary embolism ([place])   . Decubitus ulcers   . Hypertension   . Hypercholesterolemia   . Incontinence of urine   . Cancer [facility])     Endometrial Cancer  . Cervical cancer ([place])     with radiation 45 years ago...  . Arthritis   . GERD (gastroesophageal reflux disease)   . Bladder cancer ([place])   . Decubitus ulcer of buttock, stage 1   . Peripheral vascular disease ([place])   . Anemia   . Coronary artery disease   . CHF (congestive heart failure) ([place])   . Shortness of breath dyspnea   . Hypoxia   . Gangrene of foot ([place])     right  . Myocardial infarction [facility])     nstemi 12/16  . Chronic kidney disease     acute on chronic renal failure 12/16  . Dementia   . Diabetes ([place])     PAST SURGICAL HISTORY:  Past Surgical History  Procedure Laterality Date  . Total abdominal hysterectomy w/ bilateral salpingoophorectomy  07/29/2010  . Stent in leg  2016  . Cataract extraction, bilateral    . Bladder surgery    . Femoral endarterectomy      right  . Leg surgery      right leg  . Portacath placement      right  . Esophagogastroduodenoscopy N/A 12/14/2014    Procedure: ESOPHAGOGASTRODUODENOSCOPY (EGD);  Surgeon: Hulen Luster, MD;  Location: [facility] ENDOSCOPY;  Service: Endoscopy;  Laterality: N/A;  . Peripheral vascular catheterization Right 04/05/2015    Procedure: Lower Extremity Angiography;  Surgeon: Katha Cabal, MD;  Location: [place] CV LAB;  Service: Cardiovascular;  Laterality: Right;  . Peripheral vascular catheterization  04/05/2015    Procedure: Lower Extremity Intervention;   Surgeon: Katha Cabal, MD;  Location: [place] CV LAB;  Service: Cardiovascular;;  . Peripheral vascular catheterization Right 05/14/2015    Procedure: Lower Extremity Angiography;  Surgeon: Katha Cabal, MD;  Location: [place] CV LAB;  Service: Cardiovascular;  Laterality: Right;  . Peripheral vascular catheterization Right 05/14/2015    Procedure: Lower Extremity Intervention;  Surgeon: Katha Cabal, MD;  Location: [place] CV LAB;  Service: Cardiovascular;  Laterality: Right;  . Angioplasty    . Amputation Right 05/29/2015  Procedure: AMPUTATION BELOW KNEE;  Surgeon: Katha Cabal, MD;  Location: [facility] ORS;  Service: Vascular;  Laterality: Right;    ALLERGIES:  is allergic to contrast media.  MEDICATIONS:  Current Facility-Administered Medications  Medication Dose Route Frequency Provider Last Rate Last Dose  . 0.9 %  sodium chloride infusion   Intravenous Continuous Lytle But[place], MD 100 mL/hr at 06/11/15 1348    . acetaminophen (TYLENOL) tablet 650 mg  650 mg Oral Q6H PRN Hillary Bow, MD       Or  . acetaminophen (TYLENOL) suppository 650 mg  650 mg Rectal Q6H PRN Srikar Sudini, MD      . albuterol (PROVENTIL) (2.5 MG/3ML) 0.083% nebulizer solution 2.5 mg  2.5 mg Nebulization Q2H PRN Srikar Sudini, MD      . ALPRAZolam Duanne Moron) tablet 0.5 mg  0.5 mg Oral BID PRN Srikar Sudini, MD      . bisacodyl (DULCOLAX) EC tablet 5 mg  5 mg Oral Daily PRN Srikar Sudini, MD      . diltiazem (CARDIZEM) tablet 30 mg  30 mg Oral 4 times per day Epifanio Lesches, MD   30 mg at 06/11/15 1245  . docusate sodium (COLACE) capsule 100 mg  100 mg Oral BID Hillary Bow, MD   100 mg at 06/10/15 0128  . furosemide (LASIX) injection 40 mg  40 mg Intravenous Once Epifanio Lesches, MD      . HYDROcodone-acetaminophen (NORCO/VICODIN) 5-325 MG per tablet 1-2 tablet  1-2 tablet Oral Q4H PRN Srikar Sudini, MD      . HYDROmorphone (DILAUDID) injection 1 mg  1 mg Intravenous Q3H  PRN Hillary Bow, MD   1 mg at 06/11/15 1136  . insulin aspart (novoLOG) injection 0-9 Units  0-9 Units Subcutaneous TID WC Epifanio Lesches, MD   1 Units at 06/11/15 0845  . insulin detemir (LEVEMIR) injection 15 Units  15 Units Subcutaneous QHS Hillary Bow, MD   15 Units at 06/10/15 2229  . labetalol (NORMODYNE,TRANDATE) injection 10 mg  10 mg Intravenous Q4H PRN Hillary Bow, MD   10 mg at 06/11/15 1506  . linezolid (ZYVOX) IVPB 600 mg  600 mg Intravenous Q12H Epifanio Lesches, MD   600 mg at 06/11/15 1349  . magnesium oxide (MAG-OX) tablet 400 mg  400 mg Oral Daily Hillary Bow, MD   400 mg at 06/10/15 1535  . megestrol (MEGACE) 400 MG/10ML suspension 200 mg  200 mg Oral Daily Srikar Sudini, MD   200 mg at 06/10/15 1535  . metoprolol tartrate (LOPRESSOR) tablet 25 mg  25 mg Oral BID Hillary Bow, MD   Stopped at 06/10/15 0300  . mirtazapine (REMERON) tablet 15 mg  15 mg Oral QHS Hillary Bow, MD   15 mg at 06/10/15 2200  . ondansetron (ZOFRAN) tablet 4 mg  4 mg Oral Q6H PRN Hillary Bow, MD       Or  . ondansetron (ZOFRAN) injection 4 mg  4 mg Intravenous Q6H PRN Srikar Sudini, MD      . oxyCODONE (Oxy IR/ROXICODONE) immediate release tablet 15 mg  15 mg Oral Q6H PRN Srikar Sudini, MD      . pantoprazole (PROTONIX) 80 mg in sodium chloride 0.9 % 250 mL (0.32 mg/mL) infusion  8 mg/hr Intravenous Continuous Hillary Bow, MD 25 mL/hr at 06/11/15 0447 8 mg/hr at 06/11/15 0447  . [START ON 06/13/2015] pantoprazole (PROTONIX) injection 40 mg  40 mg Intravenous Q12H Srikar Sudini, MD      . piperacillin-tazobactam (ZOSYN)  IVPB 3.375 g  3.375 g Intravenous 3 times per day Hillary Bow, MD   3.375 g at 06/11/15 1505  . polyethylene glycol (MIRALAX / GLYCOLAX) packet 17 g  17 g Oral Daily PRN Srikar Sudini, MD      . risperiDONE (RISPERDAL) tablet 0.25 mg  0.25 mg Oral QHS Srikar Sudini, MD   0.25 mg at 06/10/15 2200  . sertraline (ZOLOFT) tablet 25 mg  25 mg Oral Daily Hillary Bow, MD    25 mg at 06/10/15 1536  . sodium phosphate (FLEET) 7-19 GM/118ML enema 1 enema  1 enema Rectal Once PRN Srikar Sudini, MD      . tamsulosin (FLOMAX) capsule 0.4 mg  0.4 mg Oral BID Hillary Bow, MD   Stopped at 06/10/15 0301   Facility-Administered Medications Ordered in Other Encounters  Medication Dose Route Frequency Provider Last Rate Last Dose  . sodium chloride 0.9 % injection 10 mL  10 mL Intravenous PRN Evlyn Kanner, NP   10 mL at 11/02/14 0904    Vital Signs: BP 164/54 mmHg  Pulse 87  Temp(Src) 98.6 F (37 C) (Axillary)  Resp 21  Wt 70.716 kg (155 lb 14.4 oz)  SpO2 100% Filed Weights   06/10/15 0000 06/10/15 0500 06/11/15 0513  Weight: 67.405 kg (148 lb 9.6 oz) 68.402 kg (150 lb 12.8 oz) 70.716 kg (155 lb 14.4 oz)    Estimated body mass index is 30.45 kg/(m^2) as calculated from the following:   Height as of 05/29/15: 5' (1.524 m).   Weight as of this encounter: 70.716 kg (155 lb 14.4 oz).  PERFORMANCE STATUS (ECOG) : 4 - Bedbound  PHYSICAL EXAM:       LABS: CBC:    Component Value Date/Time   WBC 20.0* 06/10/2015 0121   WBC 9.7 10/03/2014 1030   HGB 9.0* 06/10/2015 1041   HGB 9.5* 10/03/2014 1030   HCT 31.4* 06/10/2015 0121   HCT 30.7* 10/03/2014 1030   PLT 515* 06/10/2015 0121   PLT 325 10/03/2014 1030   MCV 84.4 06/10/2015 0121   MCV 81 10/03/2014 1030   NEUTROABS 18.3* 06/10/2015 0121   NEUTROABS 6.5 10/03/2014 1030   LYMPHSABS 0.8* 06/10/2015 0121   LYMPHSABS 2.2 10/03/2014 1030   MONOABS 0.8 06/10/2015 0121   MONOABS 0.7 10/03/2014 1030   EOSABS 0.0 06/10/2015 0121   EOSABS 0.2 10/03/2014 1030   BASOSABS 0.1 06/10/2015 0121   BASOSABS 0.1 10/03/2014 1030   Comprehensive Metabolic Panel:    Component Value Date/Time   NA 144 06/11/2015 1324   NA 136 07/13/2014 1022   K 3.2* 06/11/2015 1324   K 4.8 07/13/2014 1022   CL 120* 06/11/2015 1324   CL 101 07/13/2014 1022   CO2 19* 06/11/2015 1324   CO2 26 07/13/2014 1022   BUN 22*  06/11/2015 1324   BUN 27* 09/04/2014 1113   CREATININE 0.86 06/11/2015 1324   CREATININE 1.20* 10/03/2014 1030   GLUCOSE 105* 06/11/2015 1324   GLUCOSE 218* 07/13/2014 1022   CALCIUM 7.6* 06/11/2015 1324   CALCIUM 8.2* 07/13/2014 1022   AST 124* 06/11/2015 1324   AST 17 06/13/2014 1410   ALT 133* 06/11/2015 1324   ALT 10* 06/13/2014 1410   ALKPHOS 47 06/11/2015 1324   ALKPHOS 50 06/13/2014 1410   BILITOT 0.3 06/11/2015 1324   BILITOT 0.2 06/13/2014 1410   PROT 5.3* 06/11/2015 1324   PROT 7.3 06/13/2014 1410   ALBUMIN 1.6* 06/11/2015 1324   ALBUMIN 2.4* 06/13/2014 1410  TESTS:  CT A/P  06/10/15: 1. Masses about the retroperitoneum have increased in size, the largest of which appears to partially extend into the inferior vena cava, measuring 5.9 x 4.5 cm. This extends distal to the aortic bifurcation. Enlarged mesenteric nodes measure up to 1.8 cm in short axis. 2. 1.1 cm nodule at the right lower lobe is nonspecific, but could reflect metastatic disease. 3. Trace bilateral pleural effusions, with mild atelectasis. 4. Scattered coronary artery calcifications seen. 5. Cholelithiasis; gallbladder otherwise unremarkable. 6. Mild left renal scarring noted. 7. Scattered scattered calcification along the celiac trunk, superior mesenteric artery, and at the origins of the renal arteries bilaterally.   More than 50% of the visit was spent in counseling/coordination of care: Yes  Time Spent:  80 minutes

## 2015-06-11 NOTE — Consult Note (Signed)
Patient lethargic, had dose of dilaudid an hour ago.  Tender to palpation in abdomen in mid lower bilateral areas.  Not distended, no palpable masses.  NG tube contents now with dilute green color.  Will stop this for now, order given to nurse by me. If no significant build up of gastric fluid by 48 hours of stoppage of suction then can pull the NG tube.  Prognosis poor, patient is a DNR status.

## 2015-06-12 ENCOUNTER — Ambulatory Visit: Payer: Medicare Other | Admitting: Family

## 2015-06-12 DIAGNOSIS — G9341 Metabolic encephalopathy: Secondary | ICD-10-CM

## 2015-06-12 DIAGNOSIS — K5900 Constipation, unspecified: Secondary | ICD-10-CM

## 2015-06-12 DIAGNOSIS — R5383 Other fatigue: Secondary | ICD-10-CM

## 2015-06-12 DIAGNOSIS — L89154 Pressure ulcer of sacral region, stage 4: Secondary | ICD-10-CM

## 2015-06-12 DIAGNOSIS — N179 Acute kidney failure, unspecified: Secondary | ICD-10-CM

## 2015-06-12 DIAGNOSIS — I2699 Other pulmonary embolism without acute cor pulmonale: Secondary | ICD-10-CM

## 2015-06-12 DIAGNOSIS — Z515 Encounter for palliative care: Secondary | ICD-10-CM

## 2015-06-12 DIAGNOSIS — Z66 Do not resuscitate: Secondary | ICD-10-CM

## 2015-06-12 DIAGNOSIS — D500 Iron deficiency anemia secondary to blood loss (chronic): Secondary | ICD-10-CM

## 2015-06-12 DIAGNOSIS — I5032 Chronic diastolic (congestive) heart failure: Secondary | ICD-10-CM

## 2015-06-12 DIAGNOSIS — Z923 Personal history of irradiation: Secondary | ICD-10-CM

## 2015-06-12 DIAGNOSIS — Z89431 Acquired absence of right foot: Secondary | ICD-10-CM

## 2015-06-12 DIAGNOSIS — R531 Weakness: Secondary | ICD-10-CM

## 2015-06-12 MED ORDER — MORPHINE SULFATE (CONCENTRATE) 10 MG /0.5 ML PO SOLN: 10.0000 mg | ORAL | Status: AC | PRN

## 2015-06-12 MED ORDER — MORPHINE SULFATE (CONCENTRATE) 10 MG/0.5ML PO SOLN
5.0000 mg | Freq: Four times a day (QID) | ORAL | Status: DC
  Administered 2015-06-12: 12:00:00 5 mg via ORAL
  Filled 2015-06-12: qty 1

## 2015-06-12 MED ORDER — MORPHINE SULFATE (CONCENTRATE) 10 MG /0.5 ML PO SOLN: 5.0000 mg | Freq: Four times a day (QID) | ORAL | Status: DC

## 2015-06-12 MED ORDER — GLYCOPYRROLATE 1 MG PO TABS: 1.0000 mg | ORAL_TABLET | Freq: Three times a day (TID) | ORAL | Status: AC | PRN

## 2015-06-12 MED ORDER — PROCHLORPERAZINE 25 MG RE SUPP: 25.0000 mg | Freq: Three times a day (TID) | RECTAL | Status: AC | PRN

## 2015-06-12 MED ORDER — ACETAMINOPHEN 650 MG RE SUPP: 650.0000 mg | Freq: Four times a day (QID) | RECTAL | Status: AC | PRN

## 2015-06-12 MED ORDER — LORAZEPAM 0.5 MG PO TABS
0.5000 mg | ORAL_TABLET | Freq: Two times a day (BID) | ORAL | Status: AC
Start: 2015-06-12 — End: ?

## 2015-06-12 MED ORDER — LORAZEPAM 0.5 MG PO TABS: 0.5000 mg | ORAL_TABLET | ORAL | Status: AC | PRN

## 2015-06-12 MED ORDER — MORPHINE SULFATE (CONCENTRATE) 10 MG /0.5 ML PO SOLN: 10.0000 mg | ORAL | Status: DC | PRN

## 2015-06-12 MED ORDER — MORPHINE SULFATE (CONCENTRATE) 10 MG /0.5 ML PO SOLN: 5.0000 mg | ORAL | Status: AC

## 2015-06-12 MED ORDER — MORPHINE SULFATE (CONCENTRATE) 10 MG/0.5ML PO SOLN: 5.0000 mg | ORAL | Status: DC | PRN

## 2015-06-12 MED ORDER — MORPHINE SULFATE (CONCENTRATE) 10 MG/0.5ML PO SOLN
10.0000 mg | ORAL | Status: DC | PRN
  Administered 2015-06-12: 13:00:00 10 mg via ORAL
  Filled 2015-06-12: qty 1

## 2015-06-12 MED ORDER — BISACODYL 10 MG RE SUPP: 10.0000 mg | Freq: Every day | RECTAL | Status: AC | PRN

## 2015-06-12 NOTE — Discharge Summary (Signed)
Shelley Fields, is a 80 y.o. female  DOB [birth year]  MRN [id number].  Admission date:  06/09/2015  Admitting Physician  Hillary Bow, MD  Discharge Date:  06/12/2015   Primary MD  [name], MD  Recommendations for primary care physician for things to follow:  Discharged to hospice home.   Admission Diagnosis  Lactic acidosis [E87.2] Leukocytosis [D72.829] Vomiting [R11.10] Altered mental status [R41.82] Sepsis, due to unspecified organism [facility]) [A41.9]   Discharge Diagnosis  Lactic acidosis [E87.2] Leukocytosis [D72.829] Vomiting [R11.10] Altered mental status [R41.82] Sepsis, due to unspecified organism ([place]) [A41.9]    Active Problems:   Sepsis ([place])   Sacral decubitus ulcer   Acute encephalopathy      Past Medical History  Diagnosis Date  . Diabetes mellitus without complication ([place])   . C. difficile diarrhea   . Uterine cancer ([place])   . Hypogammaglobulinemia ([place])   . Pulmonary embolism ([place])   . Decubitus ulcers   . Hypertension   . Hypercholesterolemia   . Incontinence of urine   . Cancer [facility])     Endometrial Cancer  . Cervical cancer ([place])     with radiation 45 years ago...  . Arthritis   . GERD (gastroesophageal reflux disease)   . Bladder cancer ([place])   . Decubitus ulcer of buttock, stage 1   . Peripheral vascular disease ([place])   . Anemia   . Coronary artery disease   . CHF (congestive heart failure) ([place])   . Shortness of breath dyspnea   . Hypoxia   . Gangrene of foot ([place])     right  . Myocardial infarction [facility])     nstemi 12/16  . Chronic kidney disease     acute on chronic renal failure 12/16  . Dementia   . Diabetes [facility])     Past Surgical History  Procedure Laterality Date  . Total abdominal hysterectomy w/ bilateral salpingoophorectomy  07/29/2010  . Stent in  leg  2016  . Cataract extraction, bilateral    . Bladder surgery    . Femoral endarterectomy      right  . Leg surgery      right leg  . Portacath placement      right  . Esophagogastroduodenoscopy N/A 12/14/2014    Procedure: ESOPHAGOGASTRODUODENOSCOPY (EGD);  Surgeon: Hulen Luster, MD;  Location: [facility] ENDOSCOPY;  Service: Endoscopy;  Laterality: N/A;  . Peripheral vascular catheterization Right 04/05/2015    Procedure: Lower Extremity Angiography;  Surgeon: Katha Cabal, MD;  Location: [place] CV LAB;  Service: Cardiovascular;  Laterality: Right;  . Peripheral vascular catheterization  04/05/2015    Procedure: Lower Extremity Intervention;  Surgeon: Katha Cabal, MD;  Location: [place] CV LAB;  Service: Cardiovascular;;  . Peripheral vascular catheterization Right 05/14/2015    Procedure: Lower Extremity Angiography;  Surgeon: Katha Cabal, MD;  Location: [place] CV LAB;  Service: Cardiovascular;  Laterality: Right;  . Peripheral vascular catheterization Right 05/14/2015    Procedure: Lower Extremity Intervention;  Surgeon: Katha Cabal, MD;  Location: [facility] CV LAB;  Service: Cardiovascular;  Laterality: Right;  . Angioplasty    . 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 Admitted for sepsis secondary to sacral decubiti, acute encephalopathy, patient also had GI bleed with coffee-ground  vomiting from NG tube.   Hospital Course    Sepsis secondary to sacral decubiti: Started on IV vancomycin, Zosyn. Blood culture showed VRE. ID consult obtained.  Started on IV IV Zyvox.  #2 acute on chronic anemia secondary to GI bleed, had a coffee-ground have vomitings, abdominal pain. NG tube inserted. Started on Protonix drip. Patient received 2 units of packed RBC.  3. Encephalopathy: Continues to be confused. #4 history of uterine cancer now with abdominal pain, K CT of the abdomen showed cancer and also possible lung nodule. Seen by Dr. Grayland Ormond. Advised to consider hospice placement. Dr. Megan Salon from Palliative care saw the patient., Discussed the goals of therapy. Because of multiple medical problems of sacral decubiti, VRE sepsis, metabolic encephalopathy, gangrene of the right foot status post BKA, stage IV endometrial cancer, GI bleed, poor prognosis patient family met with palliative care team. Patient is appropriate for hospice home. And patient will be going to hospice home in the bed is available.  Discharge Condition: Stable   Follow UP      Discharge Instructions  and  Discharge Medications        Medication List    STOP taking these medications        ALPRAZolam 0.5 MG tablet  Commonly known as:  XANAX     amLODipine 5 MG tablet  Commonly known as:  NORVASC     aspirin 81 MG tablet     atorvastatin 40 MG tablet  Commonly known as:  LIPITOR     docusate sodium 100 MG capsule  Commonly known as:  COLACE     furosemide 20 MG tablet  Commonly known as:  LASIX     HYDROcodone-acetaminophen 5-325 MG tablet  Commonly known as:  NORCO/VICODIN     insulin detemir 100 UNIT/ML injection  Commonly known as:  LEVEMIR     ketoconazole 2 % cream  Commonly known as:  NIZORAL     magnesium oxide 400 MG tablet  Commonly known as:  MAG-OX     megestrol 625 MG/5ML suspension  Commonly known as:  MEGACE ES     metoprolol tartrate 25 MG tablet  Commonly  known as:  LOPRESSOR     mirtazapine 15 MG tablet  Commonly known as:  REMERON     oxyCODONE 15 MG immediate release tablet  Commonly known as:  ROXICODONE     promethazine 25 MG tablet  Commonly known as:  PHENERGAN     risperiDONE 0.25 MG tablet  Commonly known as:  RISPERDAL     sertraline 25 MG tablet  Commonly known as:  ZOLOFT     tamsulosin 0.4 MG Caps capsule  Commonly known as:  FLOMAX      TAKE these medications        acetaminophen 650 MG suppository  Commonly known as:  TYLENOL  Place 1 suppository (650 mg total) rectally every 6 (six) hours as needed for mild pain (or Fever >/= 101).     bisacodyl 10 MG suppository  Commonly known as:  DULCOLAX  Place 1 suppository (  10 mg total) rectally daily as needed for moderate constipation.     glycopyrrolate 1 MG tablet  Commonly known as:  ROBINUL  Take 1 tablet (1 mg total) by mouth 3 (three) times daily as needed (excessive secretions).     LORazepam 0.5 MG tablet  Commonly known as:  ATIVAN  Take 1 tablet (0.5 mg total) by mouth every 4 (four) hours as needed for anxiety.     LORazepam 0.5 MG tablet  Commonly known as:  ATIVAN  Take 1 tablet (0.5 mg total) by mouth every 12 (twelve) hours.     morphine CONCENTRATE 10 mg / 0.5 ml concentrated solution  Take 0.25 mLs (5 mg total) by mouth every 4 (four) hours.     morphine CONCENTRATE 10 mg / 0.5 ml concentrated solution  Take 0.5 mLs (10 mg total) by mouth every 2 (two) hours as needed (severe pain or shortness of breath).     prochlorperazine 25 MG suppository  Commonly known as:  COMPAZINE  Place 1 suppository (25 mg total) rectally every 8 (eight) hours as needed for nausea or vomiting.          Diet and Activity recommendation: See Discharge Instructions above   Consults obtained -GI, palliative care,ID   Major procedures and Radiology Reports - PLEASE review detailed and final reports for all details, in brief -     Ct Abdomen Pelvis Wo  Contrast  06/10/2015  CLINICAL DATA:  Acute onset of vomiting. Sepsis. Suggestion of mild hematemesis. Diffuse abdominal pain and elevated lactic acid. Initial encounter. EXAM: CT ABDOMEN AND PELVIS WITHOUT CONTRAST TECHNIQUE: Multidetector CT imaging of the abdomen and pelvis was performed following the standard protocol without IV contrast. COMPARISON:  CT of the abdomen and pelvis from 11/21/2014 FINDINGS: Trace bilateral pleural effusions are noted, with mild atelectasis. A 1.1 cm nodule is noted at the right lower lobe (image 1 of 18). Scattered coronary artery calcifications are seen. Trace pericardial fluid likely remains within normal limits. An enteric tube is noted extending to the body of the stomach. The liver and spleen are unremarkable in appearance. Tiny stones are noted dependently in the gallbladder. The gallbladder is otherwise unremarkable. The pancreas and adrenal glands are unremarkable. Nonspecific perinephric stranding is noted bilaterally. Mild left renal scarring is noted. There is no evidence of hydronephrosis. No renal or ureteral stones are identified. Scattered vascular calcifications are noted at the renal hila bilaterally. Masses about the retroperitoneum have increased in size, the largest of which appears to partially extend into the inferior vena cava, measuring approximately 5.9 x 4.5 cm. This extends distal to the aortic bifurcation. Enlarged mesenteric nodes measure up to 1.8 cm in short axis. The small bowel is unremarkable in appearance. The stomach is within normal limits. No acute vascular abnormalities are seen. Scattered calcification is noted along the celiac trunk, superior mesenteric artery and at the origins of the renal arteries bilaterally. The appendix is normal in caliber, without evidence of appendicitis. The colon is grossly unremarkable in appearance. The bladder is decompressed, with a Foley catheter in place. A small amount of free fluid is seen within the  pelvis. The patient is status post hysterectomy. No suspicious adnexal masses are seen. No inguinal lymphadenopathy is seen. No acute osseous abnormalities are identified. IMPRESSION: 1. Masses about the retroperitoneum have increased in size, the largest of which appears to partially extend into the inferior vena cava, measuring 5.9 x 4.5 cm. This extends distal to the aortic bifurcation. Enlarged  mesenteric nodes measure up to 1.8 cm in short axis. 2. 1.1 cm nodule at the right lower lobe is nonspecific, but could reflect metastatic disease. 3. Trace bilateral pleural effusions, with mild atelectasis. 4. Scattered coronary artery calcifications seen. 5. Cholelithiasis; gallbladder otherwise unremarkable. 6. Mild left renal scarring noted. 7. Scattered scattered calcification along the celiac trunk, superior mesenteric artery, and at the origins of the renal arteries bilaterally. Electronically Signed   By: Garald Balding M.D.   On: 06/10/2015 06:37   Dg Chest 2 View  05/16/2015  CLINICAL DATA:  Pulmonary edema. EXAM: CHEST  2 VIEW COMPARISON:  05/15/2015 FINDINGS: Improved pulmonary edema. Small bilateral pleural effusion, greater on the left. Chronic cardiomegaly. Porta catheter from the right, tip still at the SVC level. Calcified peritracheal nodes on the right. There is a 21 mm nodule over the right perihilar lung which appears increased from July 2016, suspected metastatic disease in this patient with metastatic retroperitoneal adenopathy. IMPRESSION: 1. Improved pulmonary edema.  Stable small pleural effusions. 2. 2 cm right perihilar nodule which appears increased from July 2016. Electronically Signed   By: Monte Fantasia M.D.   On: 05/16/2015 13:42   Dg Abd 1 View  06/10/2015  CLINICAL DATA:  Enteric tube placement.  Initial encounter. EXAM: ABDOMEN - 1 VIEW COMPARISON:  Abdominal radiograph performed earlier today at 3:19 a.m. FINDINGS: The patient's enteric tube is noted ending overlying the body  of the stomach. The visualized bowel gas pattern is grossly unremarkable. No free intra-abdominal air is seen, though evaluation for free air is limited on a single supine view. No acute osseous abnormalities are identified. Mild degenerative change is noted along the lower thoracic spine. Vascular stents are seen overlying the upper pelvis. IMPRESSION: Enteric tube noted ending overlying the body of the stomach. Electronically Signed   By: Garald Balding M.D.   On: 06/10/2015 04:05   Dg Abd 1 View  06/10/2015  CLINICAL DATA:  Nasogastric tube placement.  Initial encounter. EXAM: ABDOMEN - 1 VIEW COMPARISON:  Abdominal radiograph performed earlier today at 12:22 a.m. FINDINGS: The patient's enteric tube is noted coiling at the distal esophagus. The visualized bowel gas pattern is grossly unremarkable. A moderate amount of air is seen in the stomach. No free intra-abdominal air is seen, though evaluation for free air is limited on a single supine view. Mild degenerative change is noted along the lower thoracic spine. Vascular stents are noted overlying the upper pelvis. IMPRESSION: 1. Enteric tube noted coiling at the distal esophagus. This was subsequently repositioned. 2. Unremarkable bowel gas pattern; no free intra-abdominal air seen. Electronically Signed   By: Garald Balding M.D.   On: 06/10/2015 04:02   Dg Abd 1 View  06/10/2015  CLINICAL DATA:  Vomiting. EXAM: ABDOMEN - 1 VIEW COMPARISON:  Most recent CT 11/21/2014 FINDINGS: Air within the stomach without gastric distention. No small bowel dilatation. Small volume of stool throughout the colon without colonic dilatation. No evidence of free air on supine views. Extensive atherosclerotic calcification of the abdominal vasculature with vascular stents in the pelvis. Splenic granuloma are noted. IMPRESSION: Nonobstructive bowel gas pattern. Air in the stomach without gastric distension. Electronically Signed   By: Jeb Levering M.D.   On: 06/10/2015 01:11    US Renal  05/17/2015  CLINICAL DATA:  Stage IV chronic renal disease EXAM: RENAL ULTRASOUND COMPARISON:  CT abdomen and pelvis November 21, 2014 FINDINGS: Right Kidney: Length: 11.5 cm. Echogenicity within normal limits. There is renal cortical  thinning. No mass, perinephric fluid, or hydronephrosis visualized. There is focal scarring along the periphery of the lower pole of the right kidney. There is no sonographically demonstrable calculus or ureterectasis. Left Kidney: Length: 8.3 cm. Echogenicity within normal limits. There is renal cortical thinning. No mass, perinephric fluid, or hydronephrosis visualized. No sonographically demonstrable calculus or ureterectasis. Bladder: Urinary bladder is decompressed and cannot be assessed at this time. IMPRESSION: Bilateral renal cortical thinning, a finding that may be seen with medical renal disease. The renal echogenicity is felt to be within normal limits. No obstructing focus identified on either side. There is scarring in the lower pole right kidney region. There is size discrepancy between the kidneys with left kidney smaller than the right. This finding potentially may be indicative of renal artery stenosis on the left. In this regard, question whether patient is hypertensive. Electronically Signed   By: Lowella Grip III M.D.   On: 05/17/2015 17:17   Nm Pulmonary Perf And Vent  05/16/2015  CLINICAL DATA:  Pulmonary edema. EXAM: NUCLEAR MEDICINE VENTILATION - PERFUSION LUNG SCAN TECHNIQUE: Ventilation images were obtained in multiple projections using inhaled aerosol Tc-33mDTPA. Perfusion images were obtained in multiple projections after intravenous injection of Tc-976mAA. RADIOPHARMACEUTICALS:  32.7 Technetium-9976mPA aerosol inhalation. No technetium 99 m MAA administered. COMPARISON:  Chest x-ray 05/26/2015. FINDINGS: No ventilation perfusion images obtained as patient would only allow ventilation for approximately 1 minute. The patient refused  remainder of the exam. IMPRESSION: Patient refused exam just after ventilation.  No images obtained. Electronically Signed   By: ThoFox IslandOn: 05/16/2015 14:54   Dg Chest Port 1 View  06/09/2015  CLINICAL DATA:  Acute onset of altered mental status. Initial encounter. EXAM: PORTABLE CHEST 1 VIEW COMPARISON:  Chest radiograph performed 05/16/2015 FINDINGS: The lungs are well-aerated. A 1.9 cm nodule is again noted at the right midlung zone, concerning for metastatic disease given the patient's history. There is no evidence of pleural effusion or pneumothorax. The cardiomediastinal silhouette is borderline normal in size. No acute osseous abnormalities are seen. A right-sided chest port is noted ending about the distal SVC. IMPRESSION: 1.9 cm nodule again noted at the right midlung zone, concerning for metastatic disease given the patient's history. Lungs otherwise grossly clear. Electronically Signed   By: JefGarald BaldingD.   On: 06/09/2015 22:57   Dg Chest Port 1 View  05/15/2015  CLINICAL DATA:  Confusion. Difficulty breathing. History of endometrial carcinoma. EXAM: PORTABLE CHEST 1 VIEW COMPARISON:  December 12, 2014 FINDINGS: There is airspace consolidation throughout much of the right mid and lower lung zones. A lesser degree of infiltrate is noted in the left base. There is also underlying interstitial edema. Heart is slightly enlarged with pulmonary venous hypertension. No adenopathy. Port-A-Cath tip is in the superior vena cava. No pneumothorax. IMPRESSION: Airspace consolidation in both lung bases as well as in the right mid lung. Underlying congestive heart failure. The airspace opacity may represent alveolar edema, although the appearance is concerning for superimposed pneumonia. Note that a nodular opacity in the right mid lung noted previously is obscured by infiltrate currently. Electronically Signed   By: WilLowella GripI M.D.   On: 05/15/2015 16:15    Micro Results     Recent  Results (from the past 240 hour(s))  Culture, blood (routine x 2)     Status: None (Preliminary result)   Collection Time: 06/09/15 10:07 PM  Result Value Ref Range Status   Specimen  Description BLOOD LEFT ASSIST CONTROL  Final   Special Requests BOTTLES DRAWN AEROBIC AND ANAEROBIC 4CC  Final   Culture  Setup Time   Final    GRAM NEGATIVE RODS GRAM POSITIVE COCCI IN BOTH AEROBIC AND ANAEROBIC BOTTLES CRITICAL RESULT CALLED TO, READ BACK BY AND VERIFIED WITH: HENRY ZOMPA 06/10/15 1125AM MLM    Culture   Final    ENTEROCOCCUS SPECIES ESCHERICHIA COLI PROTEUS SPECIES IN BOTH AEROBIC AND ANAEROBIC BOTTLES SUSCEPTIBILITIES TO FOLLOW    Report Status PENDING  Incomplete  Blood Culture ID Panel (Reflexed)     Status: Abnormal   Collection Time: 06/09/15 10:07 PM  Result Value Ref Range Status   Enterococcus species DETECTED (A) NOT DETECTED Final   Listeria monocytogenes NOT DETECTED NOT DETECTED Final   Staphylococcus species NOT DETECTED NOT DETECTED Final   Staphylococcus aureus NOT DETECTED NOT DETECTED Final   Streptococcus species NOT DETECTED NOT DETECTED Final   Streptococcus agalactiae NOT DETECTED NOT DETECTED Final   Streptococcus pneumoniae NOT DETECTED NOT DETECTED Final   Streptococcus pyogenes NOT DETECTED NOT DETECTED Final   Acinetobacter baumannii NOT DETECTED NOT DETECTED Final   Enterobacteriaceae species DETECTED (A) NOT DETECTED Final   Enterobacter cloacae complex NOT DETECTED NOT DETECTED Final   Escherichia coli DETECTED (A) NOT DETECTED Final   Klebsiella oxytoca NOT DETECTED NOT DETECTED Final   Klebsiella pneumoniae NOT DETECTED NOT DETECTED Final   Proteus species DETECTED (A) NOT DETECTED Final   Serratia marcescens NOT DETECTED NOT DETECTED Final   Haemophilus influenzae NOT DETECTED NOT DETECTED Final   Neisseria meningitidis NOT DETECTED NOT DETECTED Final   Pseudomonas aeruginosa NOT DETECTED NOT DETECTED Final   Candida albicans NOT DETECTED NOT  DETECTED Final   Candida glabrata NOT DETECTED NOT DETECTED Final   Candida krusei NOT DETECTED NOT DETECTED Final   Candida parapsilosis NOT DETECTED NOT DETECTED Final   Candida tropicalis NOT DETECTED NOT DETECTED Final   Carbapenem resistance NOT DETECTED NOT DETECTED Final   Methicillin resistance NOT DETECTED NOT DETECTED Final   Vancomycin resistance DETECTED (A) NOT DETECTED Final    Comment: CRITICAL RESULT CALLED TO, READ BACK BY AND VERIFIED WITHMallie Mussel ZOMPA 1125AM 06/10/15 DETECTED: VANA/B, ENTEROCOCCUS SP, ENTEROBACTERIACEAE, E.COLI, PROTEUS SP MLM   Culture, blood (routine x 2)     Status: None (Preliminary result)   Collection Time: 06/09/15 10:08 PM  Result Value Ref Range Status   Specimen Description BLOOD LEFT ARM  Final   Special Requests BOTTLES DRAWN AEROBIC AND ANAEROBIC 4CC  Final   Culture  Setup Time   Final    GRAM NEGATIVE RODS IN BOTH AEROBIC AND ANAEROBIC BOTTLES GRAM POSITIVE COCCI ANAEROBIC BOTTLE ONLY CRITICAL RESULT CALLED TO, READ BACK BY AND VERIFIED WITH: HENRY ZOMPA 06/10/15 1240PM MLM    Culture   Final    GRAM POSITIVE COCCI ANAEROBIC BOTTLE ONLY GRAM NEGATIVE RODS IN BOTH AEROBIC AND ANAEROBIC BOTTLES IDENTIFICATION AND SUSCEPTIBILITIES TO FOLLOW    Report Status PENDING  Incomplete  Urine culture     Status: None (Preliminary result)   Collection Time: 06/09/15 10:08 PM  Result Value Ref Range Status   Specimen Description URINE, RANDOM  Final   Special Requests NONE  Final   Culture   Final    >=100,000 COLONIES/mL GAMMA HEMOLYSIS IDENTIFICATION AND SUSCEPTIBILITIES TO FOLLOW    Report Status PENDING  Incomplete       Today   Subjective:   Shelley Fields today stable  To  go to hospice home  Objective:   Blood pressure 179/66, pulse 122, temperature 97.5 F (36.4 C), temperature source Axillary, resp. rate 18, weight 70.716 kg (155 lb 14.4 oz), SpO2 100 %.   Intake/Output Summary (Last 24 hours) at 06/12/15 1133 Last data  filed at 06/11/15 1723  Gross per 24 hour  Intake   1885 ml  Output    350 ml  Net   1535 ml    Exam Awake Alert, Oriented x 3, No new F.N deficits, Normal affect [place].AT,PERRAL Supple Neck,No JVD, No cervical lymphadenopathy appriciated.  Symmetrical Chest wall movement, Good air movement bilaterally, CTAB RRR,No Gallops,Rubs or new Murmurs, No Parasternal Heave +ve B.Sounds, Abd Soft, Non tender, No organomegaly appriciated, No rebound -guarding or rigidity. No Cyanosis, Clubbing or edema, No new Rash or bruise  Data Review   CBC w Diff: Lab Results  Component Value Date   WBC 20.0* 06/10/2015   WBC 9.7 10/03/2014   HGB 9.0* 06/10/2015   HGB 9.5* 10/03/2014   HCT 31.4* 06/10/2015   HCT 30.7* 10/03/2014   PLT 515* 06/10/2015   PLT 325 10/03/2014   LYMPHOPCT 4 06/10/2015   LYMPHOPCT 22.6 10/03/2014   MONOPCT 4 06/10/2015   MONOPCT 6.9 10/03/2014   EOSPCT 0 06/10/2015   EOSPCT 2.5 10/03/2014   BASOPCT 0 06/10/2015   BASOPCT 0.7 10/03/2014    CMP: Lab Results  Component Value Date   NA 144 06/11/2015   NA 136 07/13/2014   K 3.2* 06/11/2015   K 4.8 07/13/2014   CL 120* 06/11/2015   CL 101 07/13/2014   CO2 19* 06/11/2015   CO2 26 07/13/2014   BUN 22* 06/11/2015   BUN 27* 09/04/2014   CREATININE 0.86 06/11/2015   CREATININE 1.20* 10/03/2014   PROT 5.3* 06/11/2015   PROT 7.3 06/13/2014   ALBUMIN 1.6* 06/11/2015   ALBUMIN 2.4* 06/13/2014   BILITOT 0.3 06/11/2015   BILITOT 0.2 06/13/2014   ALKPHOS 47 06/11/2015   ALKPHOS 50 06/13/2014   AST 124* 06/11/2015   AST 17 06/13/2014   ALT 133* 06/11/2015   ALT 10* 06/13/2014  .   Total Time in preparing paper work, data evaluation and todays exam - 36 minutes  Desmen Schoffstall M.D on 06/12/2015 at 11:33 AM    Note: This dictation was prepared with Dragon dictation along with smaller phrase technology. Any transcriptional errors that result from this process are unintentional.

## 2015-06-12 NOTE — Progress Notes (Signed)
[place] referral received from [place]. Shelley Fields is a 80 year old woman with an extensive past medical history including stage IV endometrial ca, now with lung nodule. 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 She was readmitted to [facility] on 1/1 for altered mental status, WBC 23.0, sepsis d/t unspecified organism and vomiting coffee ground emesis, requiring NG tube placement and protonix drip . She has been treated as well with IV antibiotics and IV fluids and has received 2 units of PRBC's. She has continued to decline despite medical interventions and is requiring IV dilaudid for pain control. Family has spoken with Palliative Medicine physician Dr. Megan Salon and have decided to focus on patient's comfort with transfer to the hospice home for EOL care and symptom management.  Writer spoke via phone to patient's daughter Shelley Fields to initiative education regarding hospice home services, philosophy and team approach to care with good understanding voiced. Consents faxed to [place] at number provided. All patient information and signed consents faxed to referral. Writer also spoke in the room with patient's sister Shelley Fields to explain services and plan of care at the hospice home, she also voiced understanding. Patient seen lying in bed, eyes closed, sleeping off and on through out visit. NG tube removed this morning by staff RN Velna Hatchet. Patient has required 2 doses of IV dilaudid 1 mg since 12:30 pm last evening. Writer has spoken with Dr. Megan Salon and plan is for patient to be changed to scheduled liquid morphine concentrate, 5 mg q 4 hrs as well as a PRN dose. This was explained by Probation officer to both [place] and [place], both voiced understanding. Plan is for patient to transfer to the hospice home today via EMS with signed portable DNR in place. Hospital care team all aware of plan. Writer to call report to the hospice home and notify EMS for pick up. Thank you for  the opportunity to serve Shelley Fields and her family. Flo Shanks RN, BSN, [place] [facility] [place], [facility] [phone] c

## 2015-06-12 NOTE — Progress Notes (Signed)
Palliative Medicine Inpatient Consult Follow Up Note   Name: Shelley Fields Date: 06/12/2015 MRN: [id number]  DOB: May 01, 1936  Referring Physician: Epifanio Lesches, MD  Palliative Care consult requested for this 80 y.o. female for goals of medical therapy in patient with endometrial cancer and a recent right BKA.  She also has numerous other chronic and end-stage conditions.   TODAY'S DISCUSSIONS AND DECISIONS: I have met with pt's sister and she agrees with trying Roxanol as a scheduled RX instead of the IV Dilaudid pt was getting. Pt was changed to Dilaudid b/c low dose prn morphine wasn't working to control pts pain --but the doses were too low and infrequent, and scheduled doses with prns permitted will likely work well.    Pt is to go to [facility] today.  I have completed the med recs for discharge.    She appears comfortable now, but will need some mouth care as she is mouth-breathing and lips are quite dry.   IMPRESSION: 1. Septic Shock ----due to sacral decubitus ulcer that is infected 2. VRE sepsis 3. Metabolic Encephalopathy 4. Recent gangrene of right foot s/p R BKA (due to PVD) 5. She has been a hospice pt (with [facility]) ---but this was not restarted after pt's BKA 6. Stage IV Endometrial Cancer ----Seen by Dr. Mike Gip on Apr 29, 2015 and it was determined that her poor performance status meant no chemo would be appropriate  ----history of radiation for cervical CA 45 yrs ago 7. DM2 8. GI bleed with coffee grounds in NG ----NPO -----no bleeding or Nausea/ Vomiting last 24 hrs so NG is pulled.  9. Anemia of chronic disease and anemia of acute GI blood loss 10. Stage IV Decubitus ulcer 11. HTN 12. Hypogammaglobulinemia 13. H/O C diff in past 14. Urinary incontinence 15. PVD 16. GERD 17. Acute on chronic renal failure 18. Chronic diastolic CHF 21. H/O PE Was on coumadin 20. Weakness and fatigue 21. CAD with NSTEMI on  12/16/201 22. Dementia ---with occasional delerium and agitation 23. Constipation 24. DM2  REVIEW OF SYSTEMS:  Patient is not able to provide ROS --due to illness  CODE STATUS: DNR   PAST MEDICAL HISTORY: Past Medical History  Diagnosis Date  . Diabetes mellitus without complication ([place])   . C. difficile diarrhea   . Uterine cancer ([place])   . Hypogammaglobulinemia ([place])   . Pulmonary embolism ([place])   . Decubitus ulcers   . Hypertension   . Hypercholesterolemia   . Incontinence of urine   . Cancer [facility])     Endometrial Cancer  . Cervical cancer ([place])     with radiation 45 years ago...  . Arthritis   . GERD (gastroesophageal reflux disease)   . Bladder cancer ([employer])   . Decubitus ulcer of buttock, stage 1   . Peripheral vascular disease ([place])   . Anemia   . Coronary artery disease   . CHF (congestive heart failure) ([place])   . Shortness of breath dyspnea   . Hypoxia   . Gangrene of foot ([place])     right  . Myocardial infarction [facility])     nstemi 12/16  . Chronic kidney disease     acute on chronic renal failure 12/16  . Dementia   . Diabetes ([place])     PAST SURGICAL HISTORY:  Past Surgical History  Procedure Laterality Date  . Total abdominal hysterectomy w/ bilateral salpingoophorectomy  07/29/2010  . Stent in leg  2016  . Cataract extraction, bilateral    .  Bladder surgery    . Femoral endarterectomy      right  . Leg surgery      right leg  . Portacath placement      right  . Esophagogastroduodenoscopy N/A 12/14/2014    Procedure: ESOPHAGOGASTRODUODENOSCOPY (EGD);  Surgeon: Hulen Luster, MD;  Location[facility] ENDOSCOPY;  Service: Endoscopy;  Laterality: N/A;  . Peripheral vascular catheterization Right 04/05/2015    Procedure: Lower Extremity Angiography;  Surgeon: Katha Cabal, MD;  Location: [place] CV LAB;  Service: Cardiovascular;  Laterality: Right;  . Peripheral vascular catheterization  04/05/2015    Procedure: Lower Extremity Intervention;   Surgeon: Katha Cabal, MD;  Location: [place] CV LAB;  Service: Cardiovascular;;  . Peripheral vascular catheterization Right 05/14/2015    Procedure: Lower Extremity Angiography;  Surgeon: Katha Cabal, MD;  Location: [place] CV LAB;  Service: Cardiovascular;  Laterality: Right;  . Peripheral vascular catheterization Right 05/14/2015    Procedure: Lower Extremity Intervention;  Surgeon: Katha Cabal, MD;  Location: [place] CV LAB;  Service: Cardiovascular;  Laterality: Right;  . Angioplasty    . Amputation Right 05/29/2015    Procedure: AMPUTATION BELOW KNEE;  Surgeon: Katha Cabal, MD;  Location: [facility] ORS;  Service: Vascular;  Laterality: Right;    Vital Signs: BP 179/66 mmHg  Pulse 122  Temp(Src) 97.5 F (36.4 C) (Axillary)  Resp 18  Wt 70.716 kg (155 lb 14.4 oz)  SpO2 100% Filed Weights   06/10/15 0000 06/10/15 0500 06/11/15 0513  Weight: 67.405 kg (148 lb 9.6 oz) 68.402 kg (150 lb 12.8 oz) 70.716 kg (155 lb 14.4 oz)    Estimated body mass index is 30.45 kg/(m^2) as calculated from the following:   Height as of 05/29/15: 5' (1.524 m).   Weight as of this encounter: 70.716 kg (155 lb 14.4 oz).  PHYSICAL EXAM: Unresponsive, lying in medical bed with mouth open  Eyes closed No JVD or TM Hrt rrr no m Lungs with ronchi no rales Abd soft with nl BS Ext pale warm dry  LABS: CBC:    Component Value Date/Time   WBC 20.0* 06/10/2015 0121   WBC 9.7 10/03/2014 1030   HGB 9.0* 06/10/2015 1041   HGB 9.5* 10/03/2014 1030   HCT 31.4* 06/10/2015 0121   HCT 30.7* 10/03/2014 1030   PLT 515* 06/10/2015 0121   PLT 325 10/03/2014 1030   MCV 84.4 06/10/2015 0121   MCV 81 10/03/2014 1030   NEUTROABS 18.3* 06/10/2015 0121   NEUTROABS 6.5 10/03/2014 1030   LYMPHSABS 0.8* 06/10/2015 0121   LYMPHSABS 2.2 10/03/2014 1030   MONOABS 0.8 06/10/2015 0121   MONOABS 0.7 10/03/2014 1030   EOSABS 0.0 06/10/2015 0121   EOSABS 0.2 10/03/2014 1030   BASOSABS  0.1 06/10/2015 0121   BASOSABS 0.1 10/03/2014 1030   Comprehensive Metabolic Panel:    Component Value Date/Time   NA 144 06/11/2015 1324   NA 136 07/13/2014 1022   K 3.2* 06/11/2015 1324   K 4.8 07/13/2014 1022   CL 120* 06/11/2015 1324   CL 101 07/13/2014 1022   CO2 19* 06/11/2015 1324   CO2 26 07/13/2014 1022   BUN 22* 06/11/2015 1324   BUN 27* 09/04/2014 1113   CREATININE 0.86 06/11/2015 1324   CREATININE 1.20* 10/03/2014 1030   GLUCOSE 105* 06/11/2015 1324   GLUCOSE 218* 07/13/2014 1022   CALCIUM 7.6* 06/11/2015 1324   CALCIUM 8.2* 07/13/2014 1022   AST 124* 06/11/2015 1324  AST 17 06/13/2014 1410   ALT 133* 06/11/2015 1324   ALT 10* 06/13/2014 1410   ALKPHOS 47 06/11/2015 1324   ALKPHOS 50 06/13/2014 1410   BILITOT 0.3 06/11/2015 1324   BILITOT 0.2 06/13/2014 1410   PROT 5.3* 06/11/2015 1324   PROT 7.3 06/13/2014 1410   ALBUMIN 1.6* 06/11/2015 1324   ALBUMIN 2.4* 06/13/2014 1410      REFERRALS TO BE ORDERED:  [facility]   More than 50% of the visit was spent in counseling/coordination of care: YES  Time Spent:  35 min

## 2015-06-12 NOTE — Progress Notes (Signed)
Report called to then hospice home, EMS notified for transport. Staff RN Velna Hatchet made aware. Thank you. Flo Shanks RN, BSN, [facility] [place], hospital liaison [phone] c

## 2015-06-12 NOTE — Clinical Social Work Note (Signed)
CSW was updated by Flo Shanks, RN, Liaison with [facility]. Pt will discharge to [facility] today. CSW updated facility of discharge plan. CSW prepared discharge packet. CSW is signing off as no further needs identified.   Darden Dates, MSW, [place] Social Worker  [phone]

## 2015-06-12 NOTE — Plan of Care (Signed)
Problem: Pain Management: Goal: General experience of comfort will improve Outcome: Progressing Pt is comfort care. Awakens at times to touch. Decreased awareness. Unable to express needs, speech is difficult to understand. Dilaudid 1 mg IV  given for pain/comfort, patient moaning and restless. Effectively decreased patient's discomfort. Foley remains in place, pt turned at times for comfort. Patient continues on room air.

## 2015-06-12 NOTE — Progress Notes (Signed)
Spoke with JD, [employer] rep at [phone], to notify of non-emergent EMS transport.  Auth notification reference given as [id number].   Service date range good from 06/12/15 - 09/10/15.   Gap exception requested to determine if services can be considered at an in-network level.

## 2015-06-12 NOTE — Progress Notes (Signed)
Pt is alert but sleeping in between care, sister at bedside, NG tube removed, received IV dilaudid for pain, pt is to be d/c to hospice home, pain medications switched to po, pt is on room air, awaiting on available bed and pt will then transport via EMS. Nurse attempted oral care, pt is resistant to opening mouth.

## 2015-06-12 NOTE — Care Management (Signed)
Sent text message to Lynita Lombard RN representative for [facility] to discuss [place] with the family members of Ms. Blankley. Shelbie Ammons RN MSN CCM care Management [phone]

## 2015-06-12 NOTE — Consult Note (Signed)
I will sign off as patient scheduled to go to [facility].

## 2015-06-13 LAB — CULTURE, BLOOD (ROUTINE X 2)

## 2015-06-13 LAB — URINE CULTURE

## 2015-06-15 LAB — CULTURE, BLOOD (ROUTINE X 2)

## 2015-06-27 DIAGNOSIS — I5032 Chronic diastolic (congestive) heart failure: Secondary | ICD-10-CM | POA: Diagnosis not present

## 2015-06-27 DIAGNOSIS — I4891 Unspecified atrial fibrillation: Secondary | ICD-10-CM | POA: Diagnosis not present

## 2015-06-27 DIAGNOSIS — L89150 Pressure ulcer of sacral region, unstageable: Secondary | ICD-10-CM | POA: Diagnosis not present

## 2015-06-27 DIAGNOSIS — I10 Essential (primary) hypertension: Secondary | ICD-10-CM | POA: Diagnosis not present

## 2015-06-27 DIAGNOSIS — I119 Hypertensive heart disease without heart failure: Secondary | ICD-10-CM | POA: Diagnosis not present

## 2015-06-28 DIAGNOSIS — R3914 Feeling of incomplete bladder emptying: Secondary | ICD-10-CM | POA: Diagnosis not present

## 2015-06-28 DIAGNOSIS — K589 Irritable bowel syndrome without diarrhea: Secondary | ICD-10-CM | POA: Diagnosis not present

## 2015-06-28 DIAGNOSIS — R32 Unspecified urinary incontinence: Secondary | ICD-10-CM | POA: Diagnosis not present

## 2015-07-10 DEATH — deceased

## 2015-07-23 ENCOUNTER — Encounter: Payer: Self-pay | Admitting: Hematology and Oncology

## 2015-07-29 ENCOUNTER — Encounter: Payer: Self-pay | Admitting: Vascular Surgery

## 2015-08-28 ENCOUNTER — Ambulatory Visit: Payer: Medicare Other

## 2015-11-25 ENCOUNTER — Other Ambulatory Visit: Payer: Self-pay | Admitting: Nurse Practitioner

## 2016-04-12 LAB — TYPE AND SCREEN
ABO/RH(D): A POS
Antibody Screen: NEGATIVE
UNIT DIVISION: 0
UNIT DIVISION: 0

## 2016-11-07 IMAGING — CT CT ABD-PELV W/O CM
1 of 2 series · 14 of 32 positions shown, 18 images · non-contrast
Comparison: CT of the abdomen and pelvis from 11/21/2014

CLINICAL DATA: Acute onset of vomiting. Sepsis. Suggestion of mild
hematemesis. Diffuse abdominal pain and elevated lactic acid.
Initial encounter.

EXAM:
CT ABDOMEN AND PELVIS WITHOUT CONTRAST
TECHNIQUE: Multidetector CT imaging of the abdomen and pelvis was performed
following the standard protocol without IV contrast.

[Series 2: routine abd pel without · axial · non-contrast · 0.80mm/px · z∈[-442,-42]mm · 14 of 92 slices shown, 18 images]
[im 8/92  soft-tissue]
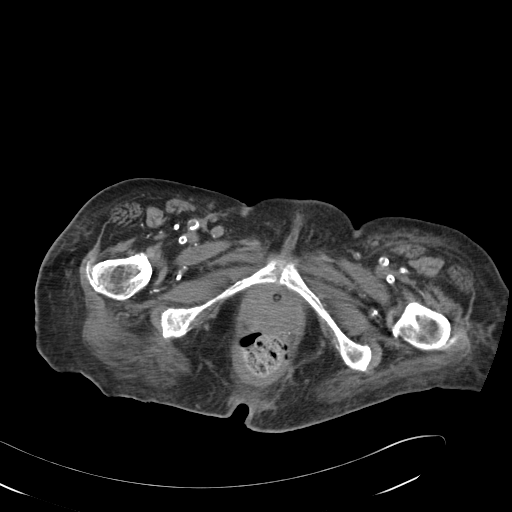
[im 8/92  bone]
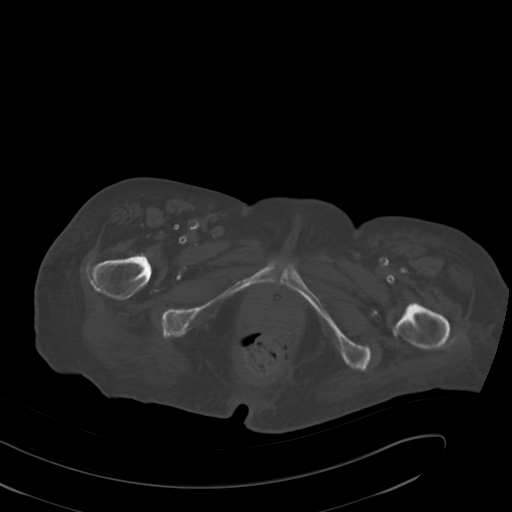
[im 15/92  soft-tissue]
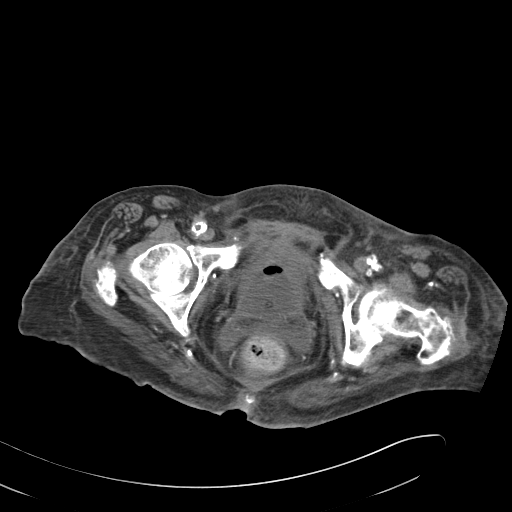
[im 22/92  soft-tissue]
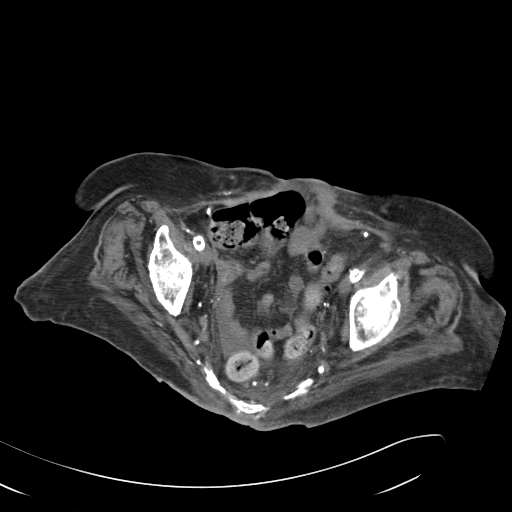
[im 29/92  soft-tissue]
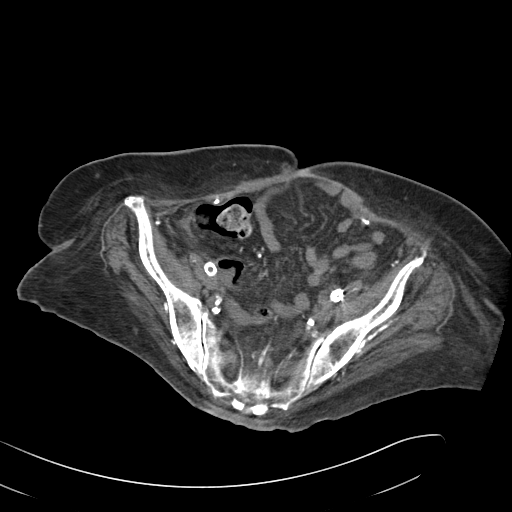
[im 36/92  soft-tissue]
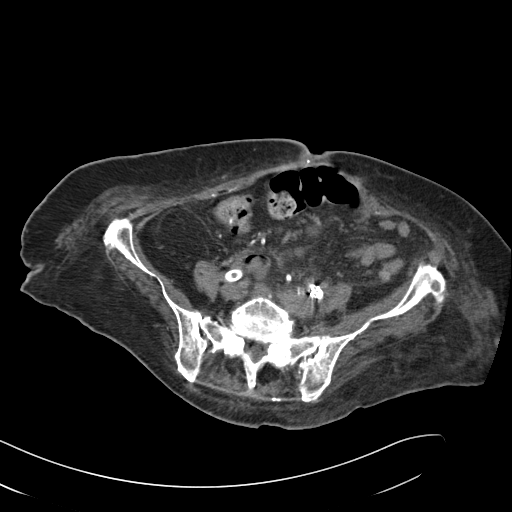
[im 43/92  soft-tissue]
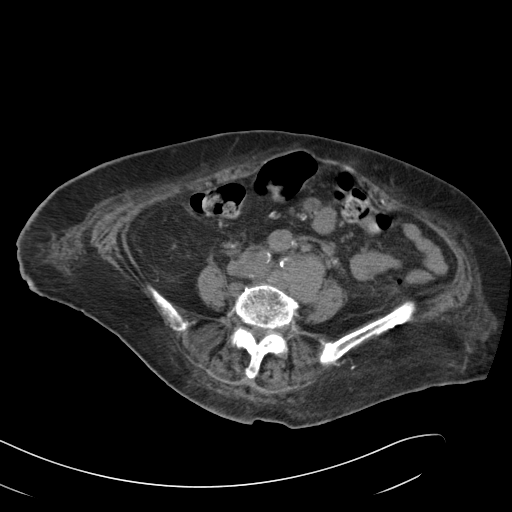
[im 50/92  soft-tissue]
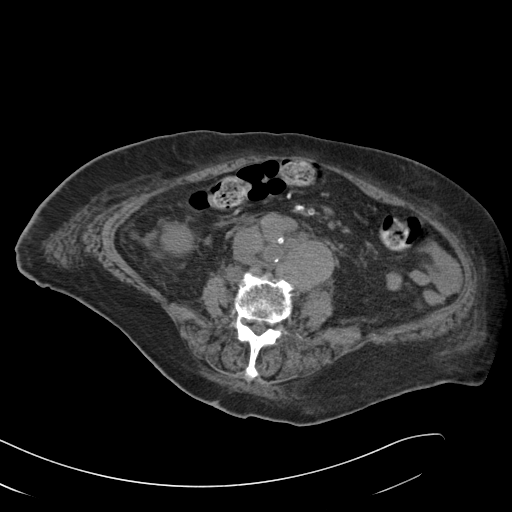
[im 57/92  soft-tissue]
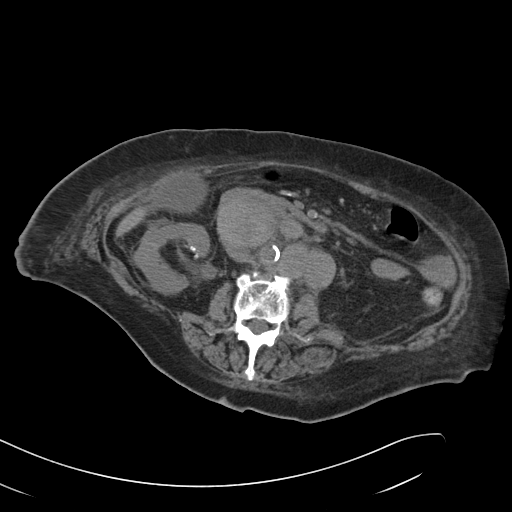
[im 64/92  soft-tissue]
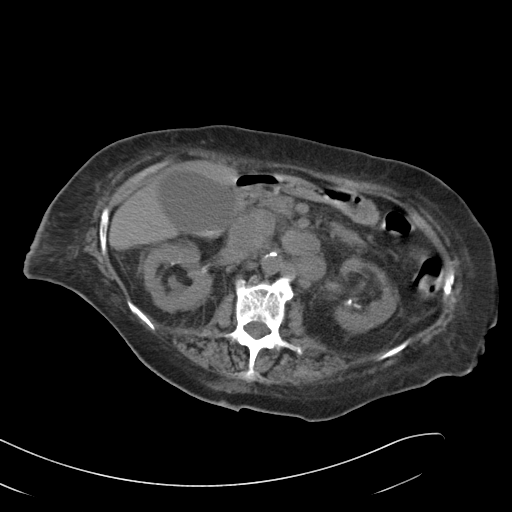
[im 64/92  bone]
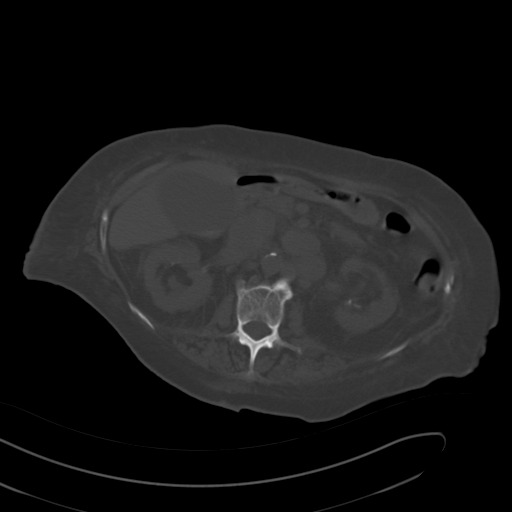
[im 71/92  soft-tissue]
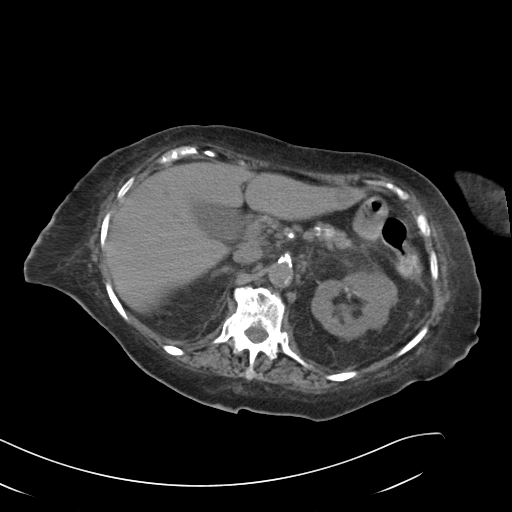
[im 78/92  soft-tissue]
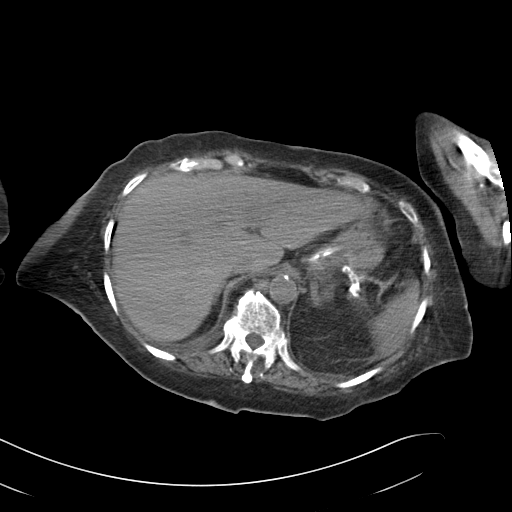
[im 78/92  lung]
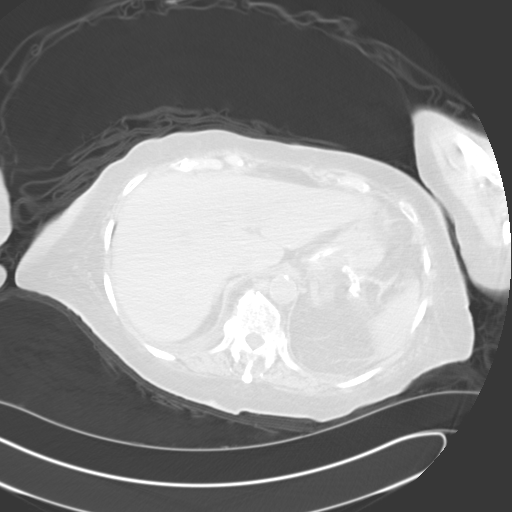
[im 81/92  lung]
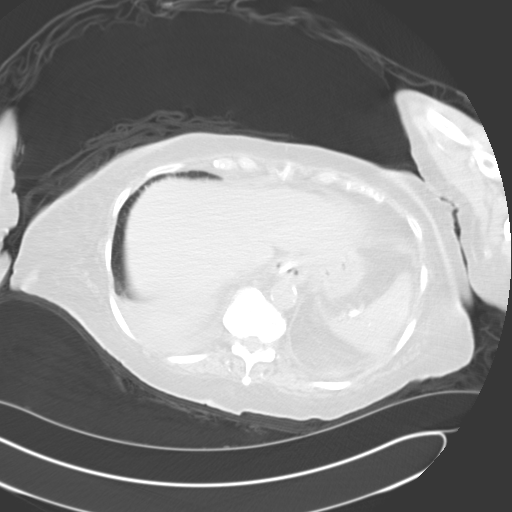
[im 85/92  soft-tissue]
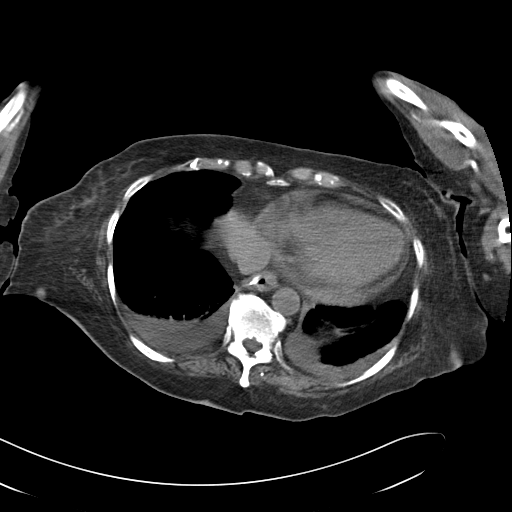
[im 85/92  lung]
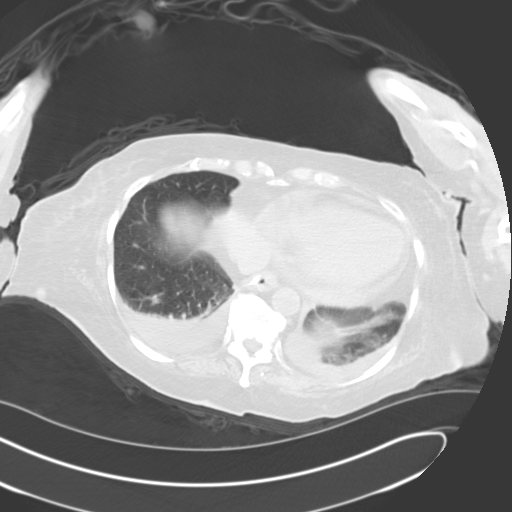
[im 88/92  lung]
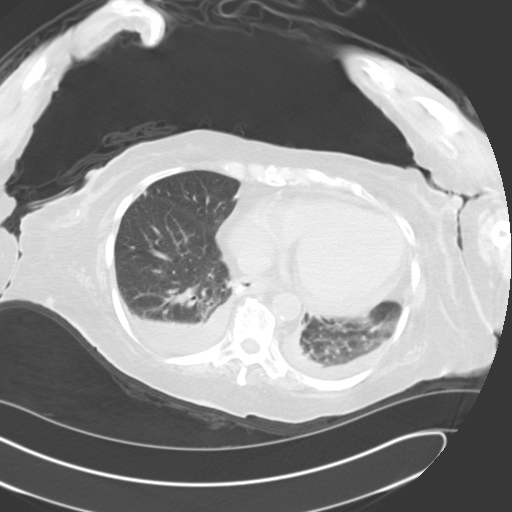

[14 of 32 positions shown; findings below may reference images not displayed]

FINDINGS: Trace bilateral pleural effusions are noted, with mild atelectasis.
A 1.1 cm nodule is noted at the right lower lobe (image 1 of 18).
Scattered coronary artery calcifications are seen. Trace pericardial
fluid likely remains within normal limits. An enteric tube is noted
extending to the body of the stomach.

The liver and spleen are unremarkable in appearance. Tiny stones are
noted dependently in the gallbladder. The gallbladder is otherwise
unremarkable. The pancreas and adrenal glands are unremarkable.

Nonspecific perinephric stranding is noted bilaterally. Mild left
renal scarring is noted. There is no evidence of hydronephrosis. No
renal or ureteral stones are identified. Scattered vascular
calcifications are noted at the renal hila bilaterally.

Masses about the retroperitoneum have increased in size, the largest
of which appears to partially extend into the inferior vena cava,
measuring approximately 5.9 x 4.5 cm. This extends distal to the
aortic bifurcation. Enlarged mesenteric nodes measure up to 1.8 cm
in short axis.

The small bowel is unremarkable in appearance. The stomach is within
normal limits. No acute vascular abnormalities are seen. Scattered
calcification is noted along the celiac trunk, superior mesenteric
artery and at the origins of the renal arteries bilaterally.

The appendix is normal in caliber, without evidence of appendicitis.
The colon is grossly unremarkable in appearance.

The bladder is decompressed, with a Foley catheter in place. A small
amount of free fluid is seen within the pelvis. The patient is
status post hysterectomy. No suspicious adnexal masses are seen. No
inguinal lymphadenopathy is seen.

No acute osseous abnormalities are identified.
IMPRESSION: 1. Masses about the retroperitoneum have increased in size, the
largest of which appears to partially extend into the inferior vena
cava, measuring 5.9 x 4.5 cm. This extends distal to the aortic
bifurcation. Enlarged mesenteric nodes measure up to 1.8 cm in short
axis.
2. 1.1 cm nodule at the right lower lobe is nonspecific, but could
reflect metastatic disease.
3. Trace bilateral pleural effusions, with mild atelectasis.
4. Scattered coronary artery calcifications seen.
5. Cholelithiasis; gallbladder otherwise unremarkable.
6. Mild left renal scarring noted.
7. Scattered scattered calcification along the celiac trunk,
superior mesenteric artery, and at the origins of the renal arteries
bilaterally.

## 2016-11-07 IMAGING — CR DG ABDOMEN 1V
1 series · 1 of 1 positions shown · non-contrast
Comparison: Abdominal radiograph performed earlier today at [DATE]
a.m.

CLINICAL DATA: Enteric tube placement.  Initial encounter.

EXAM:
ABDOMEN - 1 VIEW

[supine ap]
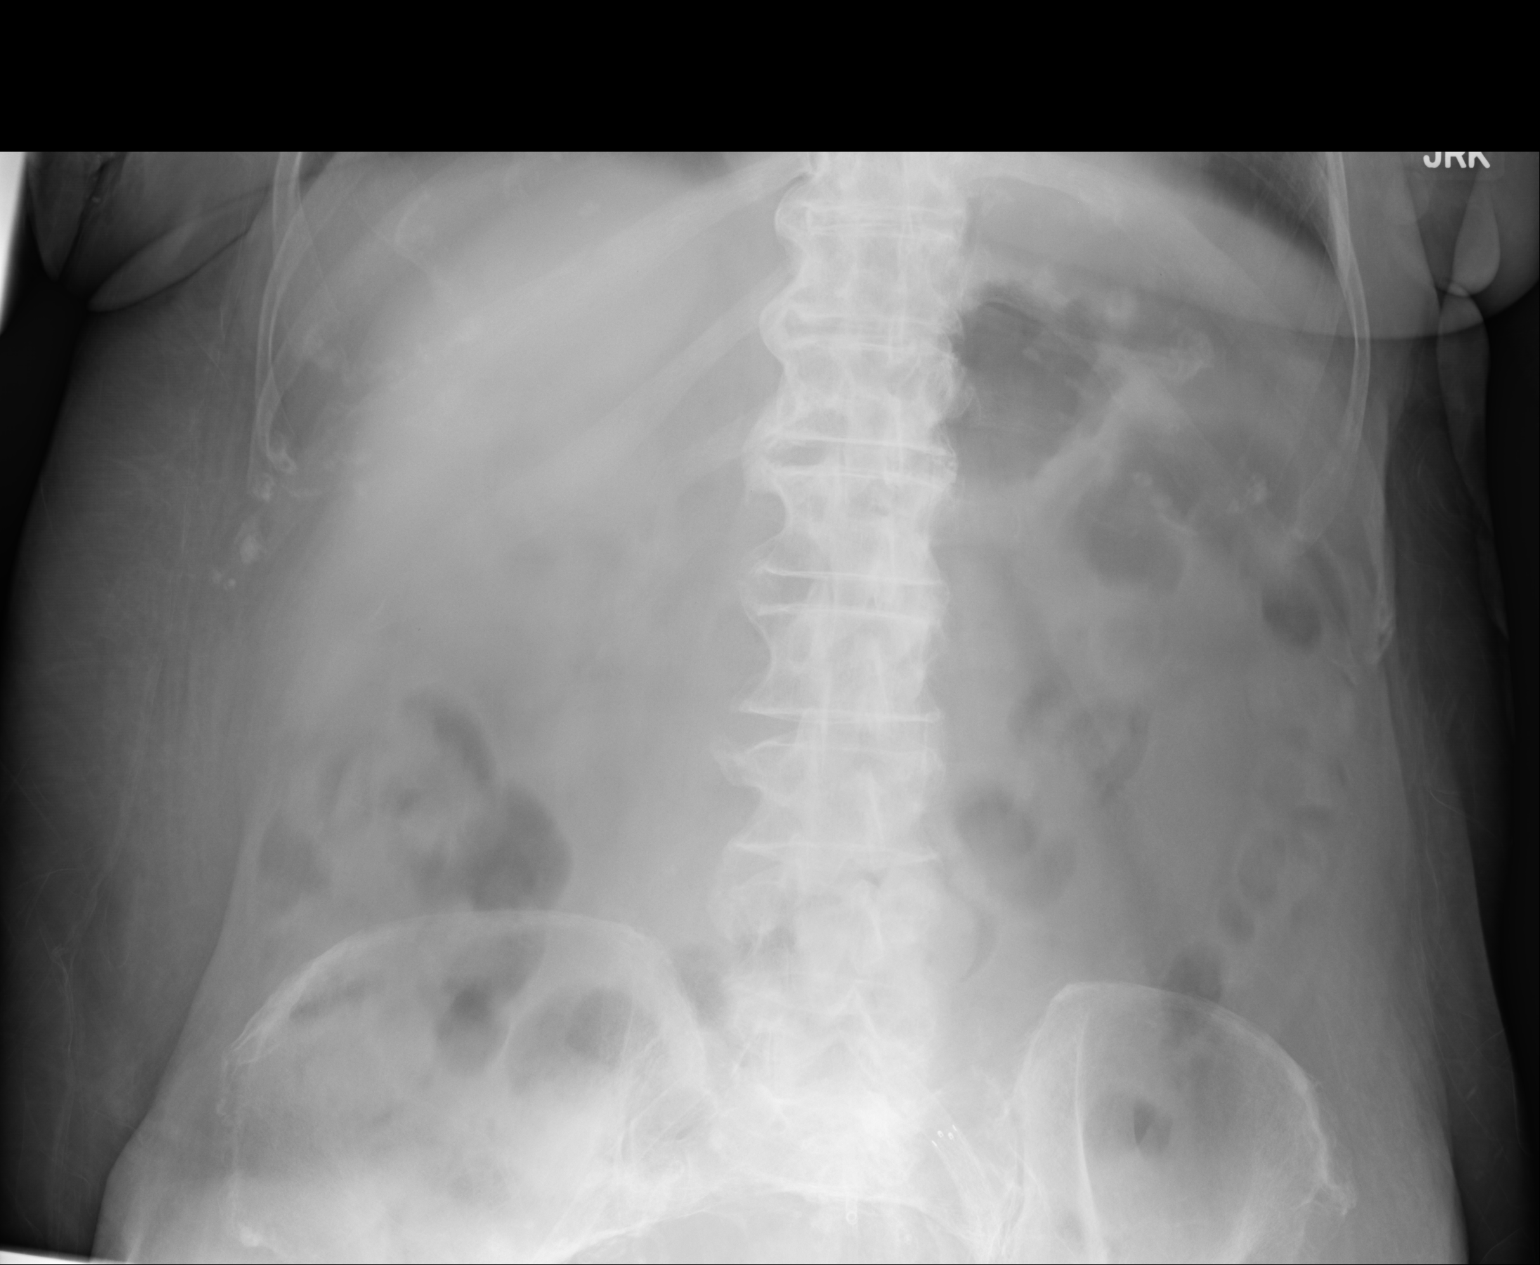

[1 of 1 positions shown; findings below may reference images not displayed]

FINDINGS: The patient's enteric tube is noted ending overlying the body of the
stomach.

The visualized bowel gas pattern is grossly unremarkable. No free
intra-abdominal air is seen, though evaluation for free air is
limited on a single supine view. No acute osseous abnormalities are
identified. Mild degenerative change is noted along the lower
thoracic spine. Vascular stents are seen overlying the upper pelvis.
IMPRESSION: Enteric tube noted ending overlying the body of the stomach.
